# Patient Record
Sex: Female | Born: 1950 | Race: White | Hispanic: No | Marital: Married | Smoking: Never smoker
Health system: Southern US, Community
[De-identification: ages and names within clinical notes are randomized; demographics above are authoritative.]

## PROBLEM LIST (undated history)

## (undated) DIAGNOSIS — C4491 Basal cell carcinoma of skin, unspecified: Secondary | ICD-10-CM

## (undated) DIAGNOSIS — R0600 Dyspnea, unspecified: Secondary | ICD-10-CM

## (undated) DIAGNOSIS — E7800 Pure hypercholesterolemia, unspecified: Secondary | ICD-10-CM

## (undated) DIAGNOSIS — E119 Type 2 diabetes mellitus without complications: Secondary | ICD-10-CM

## (undated) DIAGNOSIS — Z7901 Long term (current) use of anticoagulants: Secondary | ICD-10-CM

## (undated) DIAGNOSIS — E1169 Type 2 diabetes mellitus with other specified complication: Secondary | ICD-10-CM

## (undated) DIAGNOSIS — K551 Chronic vascular disorders of intestine: Secondary | ICD-10-CM

## (undated) DIAGNOSIS — Z8049 Family history of malignant neoplasm of other genital organs: Secondary | ICD-10-CM

## (undated) DIAGNOSIS — I2510 Atherosclerotic heart disease of native coronary artery without angina pectoris: Secondary | ICD-10-CM

## (undated) DIAGNOSIS — C229 Malignant neoplasm of liver, not specified as primary or secondary: Secondary | ICD-10-CM

## (undated) DIAGNOSIS — I259 Chronic ischemic heart disease, unspecified: Secondary | ICD-10-CM

## (undated) DIAGNOSIS — E118 Type 2 diabetes mellitus with unspecified complications: Secondary | ICD-10-CM

## (undated) DIAGNOSIS — K219 Gastro-esophageal reflux disease without esophagitis: Secondary | ICD-10-CM

## (undated) DIAGNOSIS — Z9861 Coronary angioplasty status: Principal | ICD-10-CM

## (undated) DIAGNOSIS — R0609 Other forms of dyspnea: Secondary | ICD-10-CM

## (undated) DIAGNOSIS — I700 Atherosclerosis of aorta: Secondary | ICD-10-CM

## (undated) DIAGNOSIS — E785 Hyperlipidemia, unspecified: Secondary | ICD-10-CM

## (undated) HISTORY — DX: Atherosclerotic heart disease of native coronary artery without angina pectoris: I25.10

## (undated) HISTORY — PX: CARDIAC CATHETERIZATION: SHX172

## (undated) HISTORY — DX: Hyperlipidemia, unspecified: E11.69

## (undated) HISTORY — DX: Family history of malignant neoplasm of other genital organs: Z80.49

## (undated) HISTORY — PX: COLONOSCOPY: SHX174

## (undated) HISTORY — DX: Type 2 diabetes mellitus with unspecified complications: E11.8

## (undated) HISTORY — PX: CHOLECYSTECTOMY: SHX55

## (undated) HISTORY — DX: Basal cell carcinoma of skin, unspecified: C44.91

## (undated) HISTORY — DX: Type 2 diabetes mellitus with other specified complication: E78.5

## (undated) HISTORY — DX: Coronary angioplasty status: Z98.61

---

## 2004-01-19 ENCOUNTER — Ambulatory Visit: Payer: Self-pay | Admitting: Internal Medicine

## 2005-02-07 ENCOUNTER — Ambulatory Visit: Payer: Self-pay

## 2006-07-17 ENCOUNTER — Ambulatory Visit: Payer: Self-pay | Admitting: Internal Medicine

## 2007-07-21 ENCOUNTER — Ambulatory Visit: Payer: Self-pay | Admitting: Internal Medicine

## 2008-08-04 ENCOUNTER — Ambulatory Visit: Payer: Self-pay | Admitting: Internal Medicine

## 2009-12-25 ENCOUNTER — Ambulatory Visit: Payer: Self-pay | Admitting: Internal Medicine

## 2010-03-11 DIAGNOSIS — I2510 Atherosclerotic heart disease of native coronary artery without angina pectoris: Secondary | ICD-10-CM

## 2010-03-11 HISTORY — DX: Atherosclerotic heart disease of native coronary artery without angina pectoris: I25.10

## 2010-03-11 HISTORY — PX: CORONARY STENT INTERVENTION: CATH118234

## 2010-03-15 ENCOUNTER — Ambulatory Visit: Payer: Self-pay | Admitting: Internal Medicine

## 2010-04-11 ENCOUNTER — Ambulatory Visit: Payer: Self-pay | Admitting: Cardiology

## 2010-04-12 DIAGNOSIS — I2510 Atherosclerotic heart disease of native coronary artery without angina pectoris: Secondary | ICD-10-CM

## 2010-04-12 HISTORY — PX: CORONARY ANGIOPLASTY WITH STENT PLACEMENT: SHX49

## 2010-04-12 HISTORY — DX: Atherosclerotic heart disease of native coronary artery without angina pectoris: I25.10

## 2010-04-19 ENCOUNTER — Emergency Department (HOSPITAL_COMMUNITY): Payer: 59

## 2010-04-19 ENCOUNTER — Observation Stay (HOSPITAL_COMMUNITY)
Admission: EM | Admit: 2010-04-19 | Discharge: 2010-04-20 | DRG: 313 | Disposition: A | Discharge status: Home / Self Care | Payer: 59 | Attending: Internal Medicine | Admitting: Internal Medicine

## 2010-04-19 DIAGNOSIS — R0989 Other specified symptoms and signs involving the circulatory and respiratory systems: Secondary | ICD-10-CM | POA: Insufficient documentation

## 2010-04-19 DIAGNOSIS — E785 Hyperlipidemia, unspecified: Secondary | ICD-10-CM | POA: Diagnosis present

## 2010-04-19 DIAGNOSIS — R079 Chest pain, unspecified: Secondary | ICD-10-CM

## 2010-04-19 DIAGNOSIS — I2510 Atherosclerotic heart disease of native coronary artery without angina pectoris: Secondary | ICD-10-CM | POA: Diagnosis present

## 2010-04-19 DIAGNOSIS — E119 Type 2 diabetes mellitus without complications: Secondary | ICD-10-CM | POA: Diagnosis present

## 2010-04-19 DIAGNOSIS — R0602 Shortness of breath: Secondary | ICD-10-CM

## 2010-04-19 DIAGNOSIS — R0789 Other chest pain: Principal | ICD-10-CM | POA: Diagnosis present

## 2010-04-19 DIAGNOSIS — E669 Obesity, unspecified: Secondary | ICD-10-CM | POA: Diagnosis present

## 2010-04-19 DIAGNOSIS — I10 Essential (primary) hypertension: Secondary | ICD-10-CM | POA: Diagnosis present

## 2010-04-19 DIAGNOSIS — Z880 Allergy status to penicillin: Secondary | ICD-10-CM

## 2010-04-19 DIAGNOSIS — Z9861 Coronary angioplasty status: Secondary | ICD-10-CM

## 2010-04-19 DIAGNOSIS — Z7902 Long term (current) use of antithrombotics/antiplatelets: Secondary | ICD-10-CM

## 2010-04-19 DIAGNOSIS — R0609 Other forms of dyspnea: Secondary | ICD-10-CM | POA: Insufficient documentation

## 2010-04-19 DIAGNOSIS — Z7982 Long term (current) use of aspirin: Secondary | ICD-10-CM

## 2010-04-19 LAB — POCT CARDIAC MARKERS
CKMB, poc: <1 ng/mL — ABNORMAL LOW (ref 1.0–8.0)
CKMB, poc: <1 ng/mL — ABNORMAL LOW (ref 1.0–8.0)
Myoglobin, poc: 33.6 ng/mL (ref 12–200)
Myoglobin, poc: 41.2 ng/mL (ref 12–200)

## 2010-04-19 LAB — DIFFERENTIAL
Basophils Absolute: 0 10*3/uL (ref 0.0–0.1)
Lymphocytes Relative: 36 % (ref 12–46)
Monocytes Absolute: 0.4 10*3/uL (ref 0.1–1.0)
Monocytes Relative: 8 % (ref 3–12)
Neutro Abs: 2.8 10*3/uL (ref 1.7–7.7)
Neutrophils Relative %: 54 % (ref 43–77)

## 2010-04-19 LAB — CBC
HCT: 36.7 % (ref 36.0–46.0)
Hemoglobin: 12.4 g/dL (ref 12.0–15.0)
MCH: 30.3 pg (ref 26.0–34.0)
MCHC: 33.8 g/dL (ref 30.0–36.0)
RBC: 4.09 MIL/uL (ref 3.87–5.11)

## 2010-04-19 LAB — URINALYSIS, ROUTINE W REFLEX MICROSCOPIC
Bilirubin Urine: NEGATIVE
Hgb urine dipstick: NEGATIVE
Ketones, ur: NEGATIVE mg/dL
Protein, ur: NEGATIVE mg/dL
Urine Glucose, Fasting: NEGATIVE mg/dL
Urobilinogen, UA: 0.2 mg/dL (ref 0.0–1.0)

## 2010-04-19 LAB — URINE MICROSCOPIC-ADD ON

## 2010-04-19 LAB — CARDIAC PANEL(CRET KIN+CKTOT+MB+TROPI): Relative Index: INVALID (ref 0.0–2.5)

## 2010-04-19 LAB — CK TOTAL AND CKMB (NOT AT ARMC)
CK, MB: 0.6 ng/mL (ref 0.3–4.0)
Relative Index: INVALID (ref 0.0–2.5)
Total CK: 52 U/L (ref 7–177)

## 2010-04-19 LAB — LIPASE, BLOOD: Lipase: 102 U/L — ABNORMAL HIGH (ref 11–59)

## 2010-04-19 LAB — POCT I-STAT, CHEM 8
BUN: 14 mg/dL (ref 6–23)
Creatinine, Ser: 0.9 mg/dL (ref 0.4–1.2)
Glucose, Bld: 197 mg/dL — ABNORMAL HIGH (ref 70–99)
Hemoglobin: 12.9 g/dL (ref 12.0–15.0)
Potassium: 3.9 mEq/L (ref 3.5–5.1)
Sodium: 139 mEq/L (ref 135–145)

## 2010-04-19 LAB — AMYLASE: Amylase: 105 U/L (ref 0–105)

## 2010-04-20 ENCOUNTER — Inpatient Hospital Stay (HOSPITAL_COMMUNITY): Payer: 59

## 2010-04-20 LAB — CARDIAC PANEL(CRET KIN+CKTOT+MB+TROPI)
CK, MB: 0.5 ng/mL (ref 0.3–4.0)
Relative Index: INVALID (ref 0.0–2.5)
Total CK: 48 U/L (ref 7–177)

## 2010-04-20 LAB — COMPREHENSIVE METABOLIC PANEL
Albumin: 3.5 g/dL (ref 3.5–5.2)
BUN: 9 mg/dL (ref 6–23)
CO2: 23 mEq/L (ref 19–32)
Calcium: 8.8 mg/dL (ref 8.4–10.5)
Chloride: 110 mEq/L (ref 96–112)
Creatinine, Ser: 0.73 mg/dL (ref 0.4–1.2)
GFR calc Af Amer: >60 mL/min (ref >60)
GFR calc non Af Amer: >60 mL/min (ref >60)
Total Bilirubin: 0.5 mg/dL (ref 0.3–1.2)

## 2010-04-20 LAB — URINE CULTURE: Colony Count: >=100000

## 2010-04-20 LAB — PROTIME-INR
INR: 0.98 (ref 0.00–1.49)
Prothrombin Time: 13.2 seconds (ref 11.6–15.2)

## 2010-04-20 LAB — CBC
Hemoglobin: 12.2 g/dL (ref 12.0–15.0)
MCH: 30.7 pg (ref 26.0–34.0)
Platelets: 206 10*3/uL (ref 150–400)
RBC: 3.98 MIL/uL (ref 3.87–5.11)
WBC: 4.6 10*3/uL (ref 4.0–10.5)

## 2010-04-20 LAB — GLUCOSE, CAPILLARY
Glucose-Capillary: 106 mg/dL — ABNORMAL HIGH (ref 70–99)
Glucose-Capillary: 157 mg/dL — ABNORMAL HIGH (ref 70–99)

## 2010-04-20 LAB — HEPARIN LEVEL (UNFRACTIONATED): Heparin Unfractionated: <0.1 IU/mL — ABNORMAL LOW (ref 0.30–0.70)

## 2010-04-26 NOTE — H&P (Signed)
[facility], [facility]                 ACCOUNT NO.:  [id number]  MEDICAL RECORD NO.:  [id number]           PATIENT TYPE:  I  LOCATION:  6526                         FACILITY:  [facility]  PHYSICIAN:  [name]. [name], MD    DATE OF BIRTH:  07-15-1950  DATE OF ADMISSION:  04/19/2010 DATE OF DISCHARGE:                             HISTORY & PHYSICAL   PRIMARY CARE PROVIDER:  Dr. [name] at [facility].  PRIMARY CARDIOLOGIST:  Dr. [name] in [place].  PATIENT PROFILE:  This is a 60 year old female with recent diagnosis of coronary artery disease, status post LAD stenting on April 11, 2010, who presents with recurrent chest pain and dyspnea.  PROBLEM LIST: 1. Unstable angina/coronary artery disease.     a.     The patient had an abnormal Myoview approximately 3 weeks      ago.     b.     Catheterization with percutaneous coronary intervention and      stenting of the left anterior descending at [facility] on      April 11, 2010.  Not known at this time if it was a drug-eluting      stent or bare-metal stent. 2. Hyperlipidemia x3-4 years. 3. Diabetes mellitus x5 years. 4. Status post cholecystectomy. 5. Obesity.  ALLERGIES:  PENICILLIN.  HISTORY OF PRESENT ILLNESS:  This is a 59 year old female with history of progressive dyspnea on exertion and chest pain who was referred for a Myoview earlier this year which apparently is abnormal.  She was subsequently seen by Dr. [name] and underwent diagnostic catheterization on April 11, 2010, which by her report showed "an 85% widowmaker."  This, like the LAD, was stented with at least 1 stent. She does not have a stent card and it is not likely if this is a bare- metal stent or drug-eluting stent.  She was discharged to home on April 12, 2010.  Following discharge, she is recovered well and had not had any recurrent chest pain or dyspnea.  This morning, however, she was at work and felt like she could not  quite get of breath.  She noted "airy" sensation and discomfort in the Elliott of her chest and also felt well a little bit lightheaded.  She got up and walked around some and thought that she felt better, but then later had recurrence.  She then left work and went home and while at home had left upper back sharp and shooting pain moving to the front of her left chest associated with soreness and tenderness of the left chest.  She also noted left arm aching.  She feels as though this is exactly similar to her prior angina and for that reason she called the EMS.  En route to the ED, she was treated with sublingual nitroglycerin with some improvement, but not complete relief.  ECG shows no acute changes and so far point-of-care markers are negative.  She continued to complain about 4/10 left chest tightness and arm aching with chest wall soreness.  HOME MEDICATIONS: 1. Plavix 75 mg daily. 2. Glimepiride 4 mg b.i.d. 3. Actos 15 mg  daily. 4. Januvia 100 mg daily. 5. Aspirin 325 mg daily. 6. She also takes WelChol 6 tablets a day.  Of note, she previously took lovastatin, but was taken off of it by her primary care provider in favor of WelChol.  She did not have any intolerance.  FAMILY HISTORY:  Mother died of brain cancer at 52.  Father died at 70 with a history of coronary artery disease, tobacco and alcohol abuse. She has 6 sisters who are all alive and well.  There is at least 1 sister with diabetes.  SOCIAL HISTORY:  The patient lives in [place] with her husband.  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  She denies tobacco or drug use. She has 4-5 drinks per year.  She is not exercising since December.  REVIEW OF SYSTEMS:  Positive for chest pain, dyspnea, lightheadedness, chest wall tenderness, and diabetes.  Otherwise, all systems are reviewed and are negative.  She is a full code.  PHYSICAL EXAMINATION:  VITAL SIGNS:  Temperature 98.1, heart rate 73, respirations 16,  blood pressure 102/50, and pulse ox 100% on room air. GENERAL:  A pleasant white female in no acute distress.  Awake, alert, and oriented x3.  She has a normal affect. HEENT:  Normal. NEURO:  Grossly intact and nonfocal. SKIN:  Warm and dry without lesions or masses. NECK:  Supple without bruits or JVD. LUNGS:  Respirations are regular and unlabored.  Clear to auscultation. CARDIAC:  Regular S1 and S2.  No S3, S4, or murmurs.  Left upper chest wall tenderness is noted. ABDOMEN:  Round, soft, nontender, and nondistended.  Bowel sounds present x4. EXTREMITIES:  Warm, dry, and pink.  No clubbing, cyanosis, or edema. Dorsalis pedis, posterior tibial, and radial pulses are 2+ and equal bilaterally.  Her previous cath site is without bleeding, bruits, or hematoma.  She has a normal Allen's on the right.  Chest x-ray is pending.  EKG shows sinus rhythm, rate 70, normal axis, no acute ST-T changes.  Hemoglobin 12.4, hematocrit 36.7, WBC 5.2, and platelets 234.  Sodium 139, potassium 3.9, chloride 107, CO2 of 22, BUN 14, creatinine 0.9, and glucose 197.  CK-MB less than 1.0 and troponin I less than 0.05.  ASSESSMENT/PLAN: 1. Unstable angina/coronary artery disease:  The patient presents with     recurrent left chest and scapular pain as well as left arm aching,     similar to prior angina.  Her left chest is also quite tender which     is fairly atypical.  So far, enzymes are negative and ECG is     nonacute.  Not clearly cardiac, but with similarity prior angina     and recent PCI.  Plan to admit and cycle enzymes.  Probable cath in     a.m.  Records had been requested from [facility].  We will     continue aspirin and Plavix and check a P2Y12.  Resume statin.     This is not entirely clear why this was stopped especially in the     setting of diabetes and coronary artery disease.  Add heparin and     nitrate for tonight. 2. Hyperlipidemia:  The patient was on lovastatin at home  which was     changed to [facility] by her primary care provider.  This was before     she had the diagnosis of CAD and underwent stenting.  She was not     taken off because of intolerance, in fact  she says she tolerated it     better than the WelChol.  We will resume high-dose     statin therapy. 3. Diabetes mellitus:  Continue home medications. 4. Obesity:  The patient will benefit from cardiac rehab evaluation.     Nicolasa Ducking, ANP   ______________________________ [name]. [name], MD    CB/MEDQ  D:  04/19/2010  T:  04/20/2010  Job:  [id number]  Electronically Signed by Nicolasa Ducking ANP on 04/23/2010 12:07:34 PM Electronically Signed by Lewayne Bunting MD on 04/26/2010 05:44:02 PM

## 2010-05-09 ENCOUNTER — Encounter: Payer: Self-pay | Admitting: Cardiology

## 2010-05-10 ENCOUNTER — Encounter: Payer: Self-pay | Admitting: Cardiology

## 2010-05-17 NOTE — Discharge Summary (Signed)
NAMEJAZMON, [facility]                 ACCOUNT NO.:  [id number]  MEDICAL RECORD NO.:  [id number]           PATIENT TYPE:  I  LOCATION:  6526                         FACILITY:  [facility]  PHYSICIAN:  Veverly Fells. Excell Seltzer, MD  DATE OF BIRTH:  Oct 04, 1950  DATE OF ADMISSION:  04/19/2010 DATE OF DISCHARGE:  04/20/2010                              DISCHARGE SUMMARY   PRIMARY CARDIOLOGIST:  Dr. [name] in [place].  PRIMARY CARE PROVIDER:  Dr. [name] at [place].  DISCHARGE DIAGNOSES: 1. Atypical chest pain, ruled out for myocardial infarction on this     admission. 2. Coronary artery disease status post percutaneous coronary     intervention and stenting of the left anterior descending artery at     [facility] on April 11, 2010. 3. Diabetes mellitus. 4. Hypertension. 5. Hyperlipidemia. 6. Obesity.  ALLERGIES:  PENICILLIN.  PROCEDURE/DIAGNOSTICS PERFORMED DURING HOSPITALIZATION:  Chest x-ray showing no active cardiopulmonary process.  REASON FOR HOSPITALIZATION:  This is a 60 year old female with the above- stated problem list who presented to [facility] via EMS with recurrent chest pain and dyspnea.  Her pain was associated with left upper back pain that was sharp and shooting.  It was associated with soreness and tenderness of the left chest.  There was some left arm aching.  The patient was treated with sublingual nitroglycerin en route to the emergency department without complete relief.  EKG showed no acute changes and initial point-of-care markers were negative.  The patient was admitted by the Cardiology Service for further evaluation.  HOSPITAL COURSE:  The patient ruled out for myocardial infarction with cardiac enzymes remaining negative x3.  She did have some mild focal left chest pain in the upper chest during the evening but this has dissipated.  The patient's followup EKG again showed normal sinus rhythm with no acute changes.  With the patient's  chest pain being atypical and enzymes and EKG being completely normal, it was felt that cardiac catheterization was not warranted at this time.  The patient does have a followup with Dr. [name] next week and should keep this appointments. She has been counseled regarding recurrent symptoms, specifically which should prompt a EMS call.  Dr. Excell Seltzer called and spoke with Dr. [name] by telephone and I have discussed the case with him, he is agreeable to the plan as above.  The patient will be continued on her home medications and given a trial of Protonix for questionable GI etiology.  On day of discharge, Dr. Excell Seltzer evaluated the patient and noted her stable for home with outpatient followup.  The patient is agreeable to the above.  Of note, the patient states she has been taken off her Lipitor prior to starting WelChol per her primary care provider.  She states she was not taken off secondary to intolerance but we will defer decision to restart lovastatin to Dr. [name] at her outpatient follow up next week.  DISCHARGE LABS:  WBC 4.6, hemoglobin 12.2, hematocrit 36, platelets 206,000.  Sodium 140, potassium 3.9, BUN 9, creatinine 0.73.  Cardiac enzymes negative x3.  DISCHARGE MEDICATIONS: 1. Protonix 40 mg 1  tablet daily. 2. Actos 50 mg 1 tablet daily. 3. Aspirin 325 mg 1 tablet daily. 4. Glimepiride 4 mg half tablet each morning and 1-1/2 tablets each     evening. 5. Januvia 100 mg 1 tablet daily. 6. Juice Plus over-the-counter 1 tablet twice daily. 7. Multivitamin over-the-counter 1 tablet daily. 8. Plavix 75 mg 1 tablet daily. 9. Potassium over-the-counter 1 tablet daily. 10.WelChol 625 mg 3 tablets twice daily.  FOLLOW UP PLANS: 1. Patient is to keep outpatient follow up with Dr. [name] next week. 2. Patient is to increase activity as tolerated.  3. Patient is to continue a low sodium, heart healthy diet.   Duration of discharge is greater than 30 minutes with  physician and physicians extender time.     Leonette Monarch, PA-C   ______________________________ Veverly Fells. Excell Seltzer, MD    NB/MEDQ  D:  04/20/2010  T:  04/20/2010  Job:  [id number]  cc:   [name], MD [name], MD  Electronically Signed by Alen Blew P.A. on 04/23/2010 04:19:13 PM Electronically Signed by Tonny Bollman MD on 05/15/2010 08:55:18 PM

## 2010-06-10 ENCOUNTER — Encounter: Payer: Self-pay | Admitting: Cardiology

## 2011-01-15 ENCOUNTER — Ambulatory Visit: Payer: Self-pay | Admitting: Internal Medicine

## 2012-01-15 ENCOUNTER — Ambulatory Visit: Payer: Self-pay | Admitting: Obstetrics and Gynecology

## 2012-01-27 ENCOUNTER — Ambulatory Visit: Payer: 59 | Admitting: Family Medicine

## 2013-01-19 ENCOUNTER — Ambulatory Visit: Payer: Self-pay | Admitting: Obstetrics and Gynecology

## 2013-04-16 ENCOUNTER — Ambulatory Visit: Payer: Self-pay | Admitting: Gastroenterology

## 2013-10-20 DIAGNOSIS — E785 Hyperlipidemia, unspecified: Secondary | ICD-10-CM | POA: Insufficient documentation

## 2013-10-20 DIAGNOSIS — I2510 Atherosclerotic heart disease of native coronary artery without angina pectoris: Secondary | ICD-10-CM | POA: Insufficient documentation

## 2014-01-20 ENCOUNTER — Ambulatory Visit: Payer: Self-pay

## 2014-08-22 DIAGNOSIS — K219 Gastro-esophageal reflux disease without esophagitis: Secondary | ICD-10-CM | POA: Insufficient documentation

## 2015-01-02 ENCOUNTER — Other Ambulatory Visit: Payer: Self-pay | Admitting: Infectious Diseases

## 2015-01-02 DIAGNOSIS — Z1231 Encounter for screening mammogram for malignant neoplasm of breast: Secondary | ICD-10-CM

## 2015-01-23 ENCOUNTER — Ambulatory Visit
Admission: RE | Admit: 2015-01-23 | Discharge: 2015-01-23 | Disposition: A | Discharge status: Home / Self Care | Payer: 59 | Source: Ambulatory Visit | Attending: Infectious Diseases | Admitting: Infectious Diseases

## 2015-01-23 DIAGNOSIS — Z1231 Encounter for screening mammogram for malignant neoplasm of breast: Secondary | ICD-10-CM | POA: Diagnosis present

## 2015-12-07 ENCOUNTER — Other Ambulatory Visit: Payer: Self-pay | Admitting: Infectious Diseases

## 2015-12-07 DIAGNOSIS — Z1231 Encounter for screening mammogram for malignant neoplasm of breast: Secondary | ICD-10-CM

## 2016-01-25 ENCOUNTER — Ambulatory Visit
Admission: RE | Admit: 2016-01-25 | Discharge: 2016-01-25 | Disposition: A | Discharge status: Home / Self Care | Payer: 59 | Source: Ambulatory Visit | Attending: Infectious Diseases | Admitting: Infectious Diseases

## 2016-01-25 DIAGNOSIS — Z1231 Encounter for screening mammogram for malignant neoplasm of breast: Secondary | ICD-10-CM

## 2016-12-04 ENCOUNTER — Other Ambulatory Visit: Payer: Self-pay | Admitting: Infectious Diseases

## 2016-12-04 DIAGNOSIS — Z1231 Encounter for screening mammogram for malignant neoplasm of breast: Secondary | ICD-10-CM

## 2017-02-12 ENCOUNTER — Ambulatory Visit
Admission: RE | Admit: 2017-02-12 | Discharge: 2017-02-12 | Disposition: A | Discharge status: Home / Self Care | Payer: 59 | Source: Ambulatory Visit | Attending: Infectious Diseases | Admitting: Infectious Diseases

## 2017-02-12 DIAGNOSIS — Z1231 Encounter for screening mammogram for malignant neoplasm of breast: Secondary | ICD-10-CM | POA: Insufficient documentation

## 2017-05-09 HISTORY — PX: NM GATED MYOVIEW (ARMX HX): HXRAD1831

## 2017-12-10 ENCOUNTER — Other Ambulatory Visit: Payer: Self-pay | Admitting: Internal Medicine

## 2017-12-10 DIAGNOSIS — Z1231 Encounter for screening mammogram for malignant neoplasm of breast: Secondary | ICD-10-CM

## 2018-02-13 ENCOUNTER — Ambulatory Visit
Admission: RE | Admit: 2018-02-13 | Discharge: 2018-02-13 | Disposition: A | Discharge status: Home / Self Care | Payer: 59 | Source: Ambulatory Visit | Attending: Internal Medicine | Admitting: Internal Medicine

## 2018-02-13 DIAGNOSIS — Z1231 Encounter for screening mammogram for malignant neoplasm of breast: Secondary | ICD-10-CM | POA: Insufficient documentation

## 2018-02-16 ENCOUNTER — Other Ambulatory Visit: Payer: Self-pay | Admitting: Internal Medicine

## 2018-02-16 DIAGNOSIS — R921 Mammographic calcification found on diagnostic imaging of breast: Secondary | ICD-10-CM

## 2018-02-16 DIAGNOSIS — R928 Other abnormal and inconclusive findings on diagnostic imaging of breast: Secondary | ICD-10-CM

## 2018-02-26 ENCOUNTER — Ambulatory Visit
Admission: RE | Admit: 2018-02-26 | Discharge: 2018-02-26 | Disposition: A | Discharge status: Home / Self Care | Payer: 59 | Source: Ambulatory Visit | Attending: Internal Medicine | Admitting: Internal Medicine

## 2018-02-26 DIAGNOSIS — R921 Mammographic calcification found on diagnostic imaging of breast: Secondary | ICD-10-CM | POA: Insufficient documentation

## 2018-02-26 DIAGNOSIS — R928 Other abnormal and inconclusive findings on diagnostic imaging of breast: Secondary | ICD-10-CM

## 2018-05-04 ENCOUNTER — Ambulatory Visit: Payer: 59 | Admitting: Cardiology

## 2018-05-04 ENCOUNTER — Encounter: Payer: Self-pay | Admitting: Cardiology

## 2018-05-04 VITALS — BP 108/56 | HR 81 | Ht 68.0 in | Wt 196.8 lb

## 2018-05-04 DIAGNOSIS — I2510 Atherosclerotic heart disease of native coronary artery without angina pectoris: Secondary | ICD-10-CM

## 2018-05-04 DIAGNOSIS — Z9861 Coronary angioplasty status: Secondary | ICD-10-CM | POA: Diagnosis not present

## 2018-05-04 NOTE — Progress Notes (Signed)
PCP: Patient, No Pcp Per  Cardiologist: Isaias Cowman, MD  [facility]  [place], [place] [address]  Clinic Note: Chief Complaint  Patient presents with  . Advice Only    Questions about medications for CAD  . Coronary Artery Disease    Status post LAD PCI   HPI: [facility] is a 68 y.o. female with a history of single-vessel CAD (DES PCI to LAD in 2012) who presents today for second opinion about medications for her CAD, specifically questions about aspirin Plavix)  [facility] has a history of CAD-LAD DES PCI (2012) - normal Myoview 04/2017 (as well as 2012 and 2013)  [facility] was last seen on by Dr. Saralyn Pilar back in September 2019: Was told to continue aspirin and Plavix.  No recurrent chest pain.  Excellent control of cholesterol and blood pressure.  Discussed diet patient with low-cholesterol diet (DASH)  Recent Hospitalizations: None  Studies Personally Reviewed - (if available, images/films reviewed: From Epic Chart or Care Everywhere)  [facility] 05/09/2017: [facility]) exercised 8:36 min with no chest pain or dyspnea.  No EKG changes.  Normal EF 66%.  No ischemia or infarction)  Interval History: [facility] presents here today really just asked few questions.  She seems to be relatively asymptomatic from a cardiac standpoint.  She still does her [employer] exercises 2-3 times a week without any difficulty.  She denies any resting exertional chest tightness or pressure.  No exertional dyspnea.  No PND, orthopnea or edema.  No palpitations, lightheadedness, dizziness, weakness or syncope/near syncope. No TIA/amaurosis fugax symptoms. No melena, hematochezia, hematuria, or epstaxis. No claudication.   Review of Systems  Psychiatric/Behavioral: Negative for memory loss. The patient is not nervous/anxious and does not have insomnia.     I have reviewed and (if needed) personally updated the  patient's problem list, medications, allergies, past medical and surgical history, social and family history.   Past Medical History:  Diagnosis Date  . Basal cell carcinoma    basal cell -skin cancer s/p removal  . CAD S/P percutaneous coronary angioplasty 2012   Dr. Saralyn Pilar ([place], [place]): DES PCI to LAD  . DM (diabetes mellitus), type 2 with complications (HCC)    CAD  . Hyperlipidemia associated with type 2 diabetes mellitus ([place])    On lovastatin    Past Surgical History:  Procedure Laterality Date  . CHOLECYSTECTOMY    . CORONARY STENT INTERVENTION  2012   Dr. Saralyn Pilar ([place]): DES PCI pLAD  . NM GATED MYOVIEW (ARMX HX)  05/09/2017   [facility]) exercised 8:36 min with no chest pain or dyspnea.  No EKG changes.  Normal EF 66%.  No ischemia or infarction)    Current Meds  Medication Sig  . aspirin EC 81 MG tablet Take 1 tablet by mouth daily.  . Calcium-Magnesium-Zinc 333-133-5 MG TABS Take 1 tablet by mouth daily.  . clopidogrel (PLAVIX) 75 MG tablet Take 1 tablet by mouth daily.  Marland Kitchen glimepiride (AMARYL) 4 MG tablet Take 0.5 tablet by mouth every morning and take 1.5 tablets by mouth every evening  . JANUMET 50-500 MG tablet Take 1 tablet by mouth 2 (two) times daily.  Marland Kitchen JARDIANCE 25 MG TABS tablet Take 1 tablet by mouth daily.  . lansoprazole (PREVACID) 15 MG capsule Take 1 capsule by mouth daily.  Marland Kitchen lovastatin (MEVACOR) 40 MG tablet Take 1 tablet by mouth at bedtime.  Marland Kitchen  metFORMIN (GLUCOPHAGE) 500 MG tablet Take 1 tablet by mouth 2 (two) times daily.  . Multiple Vitamin (MULTIVITAMIN) tablet Take 1 tablet by mouth daily.    Allergies  Allergen Reactions  . Exenatide Nausea Only  . Orlistat     Other reaction(s): Unknown  . Penicillins Hives and Swelling  . Thiazide-Type Diuretics     Other reaction(s): Unknown    Social History   Tobacco Use  . Smoking status: Never Smoker  . Smokeless tobacco:  Never Used  Substance Use Topics  . Alcohol use: Not Currently  . Drug use: Never   Social History   Social History Narrative   reports that she has never smoked. She has never used smokeless tobacco. She reports that she rarely drinks alcohol. She reports that she does not use drugs.      Married to [facility]     family history includes Alcohol abuse in her father; CAD in her father and mother; Diabetes in an other family member; Diabetes Mellitus II in her father, mother, and sister; Heart attack in her father; Hypertension in her father and mother; Uterine cancer in her mother.  Wt Readings from Last 3 Encounters:  05/04/18 196 lb 12.8 oz (89.3 kg)    PHYSICAL EXAM BP (!) 108/56 (BP Location: Right Arm, Patient Position: Sitting, Cuff Size: Large)   Pulse 81   Ht 5\' 8"  (1.727 m)   Wt 196 lb 12.8 oz (89.3 kg)   BMI 29.92 kg/m  Physical Exam  Constitutional: She appears well-developed and well-nourished. No distress.  HENT:  Head: Normocephalic and atraumatic.  Psychiatric: She has a normal mood and affect. Her behavior is normal. Judgment and thought content normal.  Vitals reviewed.    Adult ECG Report  Rate: 81;  Rhythm: normal sinus rhythm and Otherwise normal axis, intervals and durations.  Narrative Interpretation: Normal EKG   Other studies Reviewed: Additional studies/ records that were reviewed today include:  Recent Labs:  04/2017 (Care Everywhere) -- LDL cholesterol 66 on 04/17/2017, currently on lovastatin; hemoglobin A1c was 7.5 on 04/17/2017  ASSESSMENT / PLAN: Problem List Items Addressed This Visit    CAD S/P percutaneous coronary angioplasty - Primary (Chronic)    She has a likely 2nd-3rd generation DES stent in the LAD (do not have the cath report available).  She has had several follow-up stress test that are negative and has no symptoms.  She does note some bruising, but not significant amount of bleeding issues.  She is asked several  times about her antiplatelet agents and wanted clarification.  She says that she had been told for 6 years that she would never come off of Plavix, but then for 2 intermittent visit she has been told that she could stop it at her should not stop it.  She is not sure what this means.  Explained to her that beyond 2 years we do not have a lot of data for antiplatelet agents with CAD and PCI.  However, given the location of her stent in the proximal LAD, secondary prevention becomes a critical issue. I discussed the risk and benefits of being on Plavix versus not which would be being on aspirin alone.  At this point I do not think that she needs to be on dual antiplatelet therapy however and I think she can probably stop the aspirin.  My recommendation for more secure CAD even secondary prevention, would be to simply continue Plavix alone without aspirin.  This Plavix could  be held for 5 to 7 days preop for any procedures or surgeries if necessary.      Relevant Medications   aspirin EC 81 MG tablet   lovastatin (MEVACOR) 40 MG tablet   Other Relevant Orders   EKG 12-Lead     She is otherwise on pretty good management regiment with adequate control blood pressure and lipids.  I recommended that she continue current treatment plan with Dr. Saralyn Pilar, but would be okay to stop aspirin and continue Plavix alone.  I spent a total of 25 minutes with the patient and chart review. >  50% of the time was spent in direct patient consultation.   Current medicines are reviewed at length with the patient today.  (+/- concerns) questions about aspirin versus Plavix The following changes have been made:  Discussed above and below.  Patient Instructions  Medication Instructions:  OKAY TO STOP ASPIRIN CONTINUE TAKING PLAVIX 75 MG   IT WOULD BE OKAY TO HOLD PLAVIX FOR PROCEDURES  , CONTACT CARDIOLOGIST FOR FULL INSTRUCTIONS If you need a refill on your cardiac medications before your next appointment, please  call your pharmacy.   Lab work: NOT NEEDED If you have labs (blood work) drawn today and your tests are completely normal, you will receive your results only by: Marland Kitchen MyChart Message (if you have MyChart) OR . A paper copy in the mail If you have any lab test that is abnormal or we need to change your treatment, we will call you to review the results.  Testing/Procedures: NOT NEEDED  Follow-Up: At [facility], you and your health needs are our priority.  As part of our continuing mission to provide you with exceptional heart care, we have created designated Provider Care Teams.  These Care Teams include your primary Cardiologist (physician) and Advanced Practice Providers (APPs -  Physician Assistants and Nurse Practitioners) who all work together to provide you with the care you need, when you need it. . Your physician recommends that you schedule a follow-up appointment ON AN AS NEEDED BASIS .   Any Other Special Instructions Will Be Listed Below (If Applicable).       Studies Ordered:   Orders Placed This Encounter  Procedures  . EKG 12-Lead      Glenetta Hew, M.D., M.S. Interventional Cardiologist   Pager # [phone] Phone # [phone] [address] [place] [address], [place] [address]   Thank you for choosing Heartcare at [facility]!!

## 2018-05-04 NOTE — Patient Instructions (Signed)
Medication Instructions:  OKAY TO STOP ASPIRIN CONTINUE TAKING PLAVIX 75 MG   IT WOULD BE OKAY TO HOLD PLAVIX FOR PROCEDURES  , CONTACT CARDIOLOGIST FOR FULL INSTRUCTIONS If you need a refill on your cardiac medications before your next appointment, please call your pharmacy.   Lab work: NOT NEEDED If you have labs (blood work) drawn today and your tests are completely normal, you will receive your results only by: Marland Kitchen MyChart Message (if you have MyChart) OR . A paper copy in the mail If you have any lab test that is abnormal or we need to change your treatment, we will call you to review the results.  Testing/Procedures: NOT NEEDED  Follow-Up: At [facility], you and your health needs are our priority.  As part of our continuing mission to provide you with exceptional heart care, we have created designated Provider Care Teams.  These Care Teams include your primary Cardiologist (physician) and Advanced Practice Providers (APPs -  Physician Assistants and Nurse Practitioners) who all work together to provide you with the care you need, when you need it. . Your physician recommends that you schedule a follow-up appointment ON AN AS NEEDED BASIS .   Any Other Special Instructions Will Be Listed Below (If Applicable).

## 2018-05-04 NOTE — Assessment & Plan Note (Signed)
She has a likely 2nd-3rd generation DES stent in the LAD (do not have the cath report available).  She has had several follow-up stress test that are negative and has no symptoms.  She does note some bruising, but not significant amount of bleeding issues.  She is asked several times about her antiplatelet agents and wanted clarification.  She says that she had been told for 6 years that she would never come off of Plavix, but then for 2 intermittent visit she has been told that she could stop it at her should not stop it.  She is not sure what this means.  Explained to her that beyond 2 years we do not have a lot of data for antiplatelet agents with CAD and PCI.  However, given the location of her stent in the proximal LAD, secondary prevention becomes a critical issue. I discussed the risk and benefits of being on Plavix versus not which would be being on aspirin alone.  At this point I do not think that she needs to be on dual antiplatelet therapy however and I think she can probably stop the aspirin.  My recommendation for more secure CAD even secondary prevention, would be to simply continue Plavix alone without aspirin.  This Plavix could be held for 5 to 7 days preop for any procedures or surgeries if necessary.

## 2018-11-19 ENCOUNTER — Other Ambulatory Visit: Payer: Self-pay | Admitting: Internal Medicine

## 2018-11-19 DIAGNOSIS — Z1231 Encounter for screening mammogram for malignant neoplasm of breast: Secondary | ICD-10-CM

## 2019-02-09 DIAGNOSIS — C50919 Malignant neoplasm of unspecified site of unspecified female breast: Secondary | ICD-10-CM

## 2019-02-09 HISTORY — PX: MASTECTOMY: SHX3

## 2019-02-09 HISTORY — DX: Malignant neoplasm of unspecified site of unspecified female breast: C50.919

## 2019-02-16 ENCOUNTER — Ambulatory Visit
Admission: RE | Admit: 2019-02-16 | Discharge: 2019-02-16 | Disposition: A | Discharge status: Home / Self Care | Payer: 59 | Source: Ambulatory Visit | Attending: Internal Medicine | Admitting: Internal Medicine

## 2019-02-16 DIAGNOSIS — Z1231 Encounter for screening mammogram for malignant neoplasm of breast: Secondary | ICD-10-CM | POA: Insufficient documentation

## 2019-02-18 ENCOUNTER — Other Ambulatory Visit: Payer: Self-pay | Admitting: Internal Medicine

## 2019-02-18 DIAGNOSIS — R928 Other abnormal and inconclusive findings on diagnostic imaging of breast: Secondary | ICD-10-CM

## 2019-02-18 DIAGNOSIS — N6489 Other specified disorders of breast: Secondary | ICD-10-CM

## 2019-03-03 ENCOUNTER — Ambulatory Visit
Admission: RE | Admit: 2019-03-03 | Discharge: 2019-03-03 | Disposition: A | Discharge status: Home / Self Care | Payer: 59 | Source: Ambulatory Visit | Attending: Internal Medicine | Admitting: Internal Medicine

## 2019-03-03 DIAGNOSIS — N6489 Other specified disorders of breast: Secondary | ICD-10-CM | POA: Insufficient documentation

## 2019-03-03 DIAGNOSIS — R928 Other abnormal and inconclusive findings on diagnostic imaging of breast: Secondary | ICD-10-CM | POA: Insufficient documentation

## 2019-03-08 ENCOUNTER — Other Ambulatory Visit: Payer: Self-pay | Admitting: Internal Medicine

## 2019-03-08 DIAGNOSIS — N6320 Unspecified lump in the left breast, unspecified quadrant: Secondary | ICD-10-CM

## 2019-03-08 DIAGNOSIS — R928 Other abnormal and inconclusive findings on diagnostic imaging of breast: Secondary | ICD-10-CM

## 2019-03-11 ENCOUNTER — Ambulatory Visit
Admission: RE | Admit: 2019-03-11 | Discharge: 2019-03-11 | Disposition: A | Discharge status: Home / Self Care | Payer: 59 | Source: Ambulatory Visit | Attending: Internal Medicine | Admitting: Internal Medicine

## 2019-03-11 DIAGNOSIS — N6320 Unspecified lump in the left breast, unspecified quadrant: Secondary | ICD-10-CM

## 2019-03-11 DIAGNOSIS — R928 Other abnormal and inconclusive findings on diagnostic imaging of breast: Secondary | ICD-10-CM

## 2019-03-11 DIAGNOSIS — C50912 Malignant neoplasm of unspecified site of left female breast: Secondary | ICD-10-CM

## 2019-03-11 HISTORY — PX: BREAST BIOPSY: SHX20

## 2019-03-11 HISTORY — DX: Malignant neoplasm of unspecified site of left female breast: C50.912

## 2019-03-15 ENCOUNTER — Other Ambulatory Visit: Payer: Self-pay | Admitting: Anatomic Pathology & Clinical Pathology

## 2019-03-15 ENCOUNTER — Encounter: Payer: Self-pay | Admitting: *Deleted

## 2019-03-15 NOTE — Progress Notes (Signed)
Called patient to establish navigation services.  No answer.  Left message for patient to return my call. 

## 2019-03-16 ENCOUNTER — Encounter: Payer: Self-pay | Admitting: *Deleted

## 2019-03-16 DIAGNOSIS — C50912 Malignant neoplasm of unspecified site of left female breast: Secondary | ICD-10-CM

## 2019-03-16 NOTE — Progress Notes (Signed)
Patient returned my call today.  We discussed navigation services.  She does not have a surgical preference or medical oncology preference.  Per her primary care providers preference, I have scheduled her to see Dr. Peyton Najjar at [facility] for a surgical consult on 03/18/19 @ 1:30, and a medical oncology consult with Dr. Tasia Catchings on 03/23/19 @ 9:30.  She can pick up her educational material at Dr. Deniece Ree office.  She is to call with any questions or needs.  Left her a message with appointment dates.

## 2019-03-18 ENCOUNTER — Ambulatory Visit: Payer: Self-pay | Admitting: General Surgery

## 2019-03-18 ENCOUNTER — Other Ambulatory Visit: Payer: Self-pay | Admitting: General Surgery

## 2019-03-18 DIAGNOSIS — C50012 Malignant neoplasm of nipple and areola, left female breast: Secondary | ICD-10-CM

## 2019-03-18 LAB — SURGICAL PATHOLOGY

## 2019-03-18 NOTE — H&P (Signed)
PATIENT PROFILE: Tammy Elliott is a 69 y.o. female who presents to the Clinic for consultation at the request of Dr. Edwina Barth for evaluation of left breast cancer.  PCP:  Velna Ochs, MD  HISTORY OF PRESENT ILLNESS: Tammy Elliott reports she had her usual screening mammogram.  Suspicious lesion was identified in the left breast.  Diagnostic mammogram and ultrasound was done.  6 mm distortion was identified on the left breast at the 1:30 position.  Stereotactic core biopsy was done.  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  Patient denies any palpable mass, any skin changes, any breast pain, any nipple retraction, nipple discharge.  Family history of breast cancer: None Family history of other cancers: None Menarche: 69 years old Menopause: 69 years old Used OCP: For 15 years Used estrogen and progesterone therapy: None History of Radiation to the chest: None Number of pregnancies: None  PROBLEM LIST:         Problem List  Date Reviewed: 07/24/2018         Noted   Pelvic pain in female Unknown   GERD (gastroesophageal reflux disease) 08/22/2014   CAD (coronary artery disease) 10/20/2013   Overview    Status post 2.5 x 15 mm.  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) Unknown   Chronic ischemic heart disease, unspecified Unknown   Basal cell carcinoma Unknown   Overview    S/P Removal         GENERAL REVIEW OF SYSTEMS:   General ROS: negative for - chills, fatigue, fever, positive for weight loss  Allergy and Immunology ROS: negative for - hives  Hematological and Lymphatic ROS: negative for - bleeding problems or bruising, negative for palpable nodes Endocrine ROS: negative for - heat or cold intolerance, hair changes Respiratory ROS:  negative for - cough, shortness of breath or wheezing Cardiovascular ROS: no chest pain or palpitations GI ROS: negative for nausea, vomiting, abdominal pain, diarrhea, constipation Musculoskeletal ROS: negative for - joint swelling or muscle pain Neurological ROS: negative for - confusion, syncope Dermatological ROS: negative for pruritus and rash Psychiatric: negative for anxiety, depression, difficulty sleeping and memory loss  MEDICATIONS: Current Medications        Current Outpatient Medications  Medication Sig Dispense Refill  . blood glucose meter kit Use 1 each as directed. 1 each 0  . calcium-magnesium-zinc 333-133-5 mg Tab Take 1 tablet by mouth once daily.    . clopidogreL (PLAVIX) 75 mg tablet TAKE 1 TABLET BY MOUTH ONCE DAILY 90 tablet 3  . glimepiride (AMARYL) 4 MG tablet TAKE 1 TABLET BY MOUTH TWO  TIMES DAILY 180 tablet 1  . JANUMET 50-500 mg tablet TAKE 1 TABLET BY MOUTH TWO  TIMES DAILY WITH MEALS 180 tablet 3  . JARDIANCE 25 mg tablet TAKE 1 TABLET(25 MG) BY MOUTH EVERY DAY 90 tablet 0  . lancets Use 1 each once daily. Use as instructed. 100 each 5  . lancing device with lancets kit Use 1 each once daily Use as instructed. 1 each 0  . lansoprazole (PREVACID) 15 MG DR capsule Take 1 capsule (15 mg total) by mouth once daily 90 capsule 1  . lovastatin (MEVACOR) 40 MG tablet TAKE 1 TABLET BY MOUTH  DAILY 90 tablet 3  . metFORMIN (GLUCOPHAGE)  500 MG tablet Take 1 tablet (500 mg total) by mouth 2 (two) times daily with meals 180 tablet 1  . multivitamin tablet Take 1 tablet by mouth once daily.    Glory Rosebush VERIO TEST STRIPS test strip USE AS DIRECTED 1 STRIP  ONCE DAILY 100 strip 3   No current facility-administered medications for this visit.       ALLERGIES: Byetta [exenatide], Penicillins, Thiazides, and Xenical [orlistat]  PAST MEDICAL HISTORY:     Past Medical History:  Diagnosis Date  . Basal cell carcinoma    S/P Removal  . Chronic ischemic  heart disease, unspecified   . Other and unspecified hyperlipidemia   . Type 2 diabetes mellitus (CMS-HCC)     PAST SURGICAL HISTORY:      Past Surgical History:  Procedure Laterality Date  . CHOLECYSTECTOMY       FAMILY HISTORY:      Family History  Problem Relation Age of Onset  . Coronary Artery Disease (Blocked arteries around heart) Mother   . Uterine cancer Mother   . Diabetes Mother   . High blood pressure (Hypertension) Mother   . Myocardial Infarction (Heart attack) Father   . Alcohol abuse Father   . Diabetes Father        insulin  . High blood pressure (Hypertension) Father   . Colon polyps Sister        Diagnosed in her 81s  . Diabetes Sister   . Diabetes Other        Almost all sisters are diabetic or pre-diabetic     SOCIAL HISTORY: Social History          Socioeconomic History  . Marital status: Married    Spouse name: Not on file  . Number of children: Not on file  . Years of education: Not on file  . Highest education level: Not on file  Occupational History  . Not on file  Social Needs  . Financial resource strain: Not on file  . Food insecurity    Worry: Not on file    Inability: Not on file  . Transportation needs    Medical: Not on file    Non-medical: Not on file  Tobacco Use  . Smoking status: Never Smoker  . Smokeless tobacco: Never Used  Substance and Sexual Activity  . Alcohol use: Not Currently    Alcohol/week: 0.0 standard drinks    Comment: Occasionally  . Drug use: No  . Sexual activity: Yes    Birth control/protection: Post-menopausal  Other Topics Concern  . 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 Also competes in shooting competitions.      PHYSICAL EXAM:    Vitals:   03/18/19 0809  BP: 124/73  Pulse: 78   Body mass index is 27.98 kg/m. Weight: 83.5 kg (184 lb)    GENERAL: Alert, active, oriented x3  HEENT: Pupils equal reactive to light. Extraocular movements are intact. Sclera clear. Palpebral conjunctiva normal red color.Pharynx clear.  NECK: Supple with no palpable mass and no adenopathy.  LUNGS: Sound clear with no rales rhonchi or wheezes.  HEART: Regular rhythm S1 and S2 without murmur.  BREAST: breasts appear normal, no suspicious masses, no skin or nipple changes or axillary nodes.  There is a small bruise on the lateral aspect of the left breast  ABDOMEN: Soft and depressible, nontender with  no palpable mass, no hepatomegaly.  EXTREMITIES: Well-developed well-nourished symmetrical with no dependent edema.  NEUROLOGICAL: Awake alert oriented, facial expression symmetrical, moving all extremities.  REVIEW OF DATA: I have reviewed the following data today:      No visits with results within 3 Month(s) from this visit.  Latest known visit with results is:  Appointment on 10/29/2018  Component Date Value  . SARS-COV-2, NAA - [facility] 10/29/2018 Not Detected     I personally evaluated the images of the screening mammogram, diagnostic mammogram and ultrasound and the clip placement confirmation mammogram.  ASSESSMENT: Tammy Elliott is a 69 y.o. female presenting for consultation for left breast cancer.    Patient was oriented again about the pathology results. Surgical alternatives were discussed with patient including partial vs total mastectomy. Surgical technique and post operative care was discussed with patient. Risk of surgery was discussed with patient including but not limited to: wound infection, seroma, hematoma, brachial plexopathy, mondor's disease (thrombosis of small veins of breast), chronic wound pain, breast lymphedema, altered sensation to the nipple and cosmesis among others.   Due to the density of the patient breast and the level of fissures of the breast cancer, bilateral MRI of the breast is recommended.   I discussed with the patient these recommendations and the patient agreed to proceed with MRI.  Patient will be evaluated by oncology next week.  Patient will be presenting with the wound for next Monday.  Malignant neoplasm of upper-outer quadrant of left female breast, unspecified estrogen receptor status (CMS-HCC) [C50.412]  PLAN: 1.  Left breast needle guided partial mastectomy with sentinel needle biopsy (19301, [id number]) 2. CBC, CMP 3. Cardiology clearance to hold Plavix for 7 days before surgery 4. Bilateral breast MRI to rule out other breast lesions due to lobular features 5. Contact us if has any question or concern.   Patient verbalized understanding, all questions were answered, and were agreeable with the plan outlined above.     Herbert Pun, MD  Electronically signed by Herbert Pun, MD

## 2019-03-19 ENCOUNTER — Other Ambulatory Visit: Payer: Self-pay | Admitting: General Surgery

## 2019-03-19 DIAGNOSIS — C50012 Malignant neoplasm of nipple and areola, left female breast: Secondary | ICD-10-CM

## 2019-03-23 ENCOUNTER — Inpatient Hospital Stay: Payer: 59

## 2019-03-23 ENCOUNTER — Inpatient Hospital Stay: Payer: 59 | Attending: Oncology | Admitting: Oncology

## 2019-03-23 ENCOUNTER — Other Ambulatory Visit: Payer: Self-pay

## 2019-03-23 ENCOUNTER — Encounter: Payer: Self-pay | Admitting: *Deleted

## 2019-03-23 ENCOUNTER — Encounter: Payer: Self-pay | Admitting: Oncology

## 2019-03-23 ENCOUNTER — Ambulatory Visit
Admission: RE | Admit: 2019-03-23 | Discharge: 2019-03-23 | Disposition: A | Discharge status: Home / Self Care | Payer: 59 | Source: Ambulatory Visit | Attending: General Surgery | Admitting: General Surgery

## 2019-03-23 VITALS — BP 115/72 | HR 82 | Temp 95.7°F | Resp 16 | Ht 68.0 in | Wt 184.6 lb

## 2019-03-23 DIAGNOSIS — Z955 Presence of coronary angioplasty implant and graft: Secondary | ICD-10-CM | POA: Insufficient documentation

## 2019-03-23 DIAGNOSIS — Z85828 Personal history of other malignant neoplasm of skin: Secondary | ICD-10-CM | POA: Insufficient documentation

## 2019-03-23 DIAGNOSIS — Z170 Estrogen receptor positive status [ER+]: Secondary | ICD-10-CM | POA: Insufficient documentation

## 2019-03-23 DIAGNOSIS — C50412 Malignant neoplasm of upper-outer quadrant of left female breast: Secondary | ICD-10-CM | POA: Insufficient documentation

## 2019-03-23 DIAGNOSIS — C50012 Malignant neoplasm of nipple and areola, left female breast: Secondary | ICD-10-CM

## 2019-03-23 DIAGNOSIS — E119 Type 2 diabetes mellitus without complications: Secondary | ICD-10-CM | POA: Insufficient documentation

## 2019-03-23 DIAGNOSIS — Z7984 Long term (current) use of oral hypoglycemic drugs: Secondary | ICD-10-CM | POA: Insufficient documentation

## 2019-03-23 DIAGNOSIS — Z833 Family history of diabetes mellitus: Secondary | ICD-10-CM | POA: Insufficient documentation

## 2019-03-23 DIAGNOSIS — Z8042 Family history of malignant neoplasm of prostate: Secondary | ICD-10-CM

## 2019-03-23 DIAGNOSIS — Z79899 Other long term (current) drug therapy: Secondary | ICD-10-CM | POA: Insufficient documentation

## 2019-03-23 DIAGNOSIS — C50912 Malignant neoplasm of unspecified site of left female breast: Secondary | ICD-10-CM

## 2019-03-23 DIAGNOSIS — E785 Hyperlipidemia, unspecified: Secondary | ICD-10-CM | POA: Diagnosis not present

## 2019-03-23 DIAGNOSIS — Z8249 Family history of ischemic heart disease and other diseases of the circulatory system: Secondary | ICD-10-CM | POA: Insufficient documentation

## 2019-03-23 LAB — CBC WITH DIFFERENTIAL/PLATELET
Abs Immature Granulocytes: 0.01 10*3/uL (ref 0.00–0.07)
Basophils Absolute: 0 10*3/uL (ref 0.0–0.1)
Basophils Relative: 1 %
Eosinophils Absolute: 0.1 10*3/uL (ref 0.0–0.5)
Eosinophils Relative: 2 %
HCT: 44.2 % (ref 36.0–46.0)
Hemoglobin: 14.2 g/dL (ref 12.0–15.0)
Immature Granulocytes: 0 %
Lymphocytes Relative: 27 %
Lymphs Abs: 1.5 10*3/uL (ref 0.7–4.0)
MCH: 30.6 pg (ref 26.0–34.0)
MCHC: 32.1 g/dL (ref 30.0–36.0)
MCV: 95.3 fL (ref 80.0–100.0)
Monocytes Absolute: 0.5 10*3/uL (ref 0.1–1.0)
Monocytes Relative: 9 %
Neutro Abs: 3.3 10*3/uL (ref 1.7–7.7)
Neutrophils Relative %: 61 %
Platelets: 227 10*3/uL (ref 150–400)
RBC: 4.64 MIL/uL (ref 3.87–5.11)
RDW: 12.2 % (ref 11.5–15.5)
WBC: 5.3 10*3/uL (ref 4.0–10.5)
nRBC: 0 % (ref 0.0–0.2)

## 2019-03-23 LAB — COMPREHENSIVE METABOLIC PANEL
ALT: 23 U/L (ref 0–44)
AST: 23 U/L (ref 15–41)
Albumin: 4.2 g/dL (ref 3.5–5.0)
Alkaline Phosphatase: 57 U/L (ref 38–126)
Anion gap: 12 (ref 5–15)
BUN: 18 mg/dL (ref 8–23)
CO2: 25 mmol/L (ref 22–32)
Calcium: 9.5 mg/dL (ref 8.9–10.3)
Chloride: 101 mmol/L (ref 98–111)
Creatinine, Ser: 0.97 mg/dL (ref 0.44–1.00)
GFR calc Af Amer: >60 mL/min (ref >60)
GFR calc non Af Amer: >60 mL/min (ref >60)
Glucose, Bld: 219 mg/dL — ABNORMAL HIGH (ref 70–99)
Potassium: 4.4 mmol/L (ref 3.5–5.1)
Sodium: 138 mmol/L (ref 135–145)
Total Bilirubin: 0.6 mg/dL (ref 0.3–1.2)
Total Protein: 7.6 g/dL (ref 6.5–8.1)

## 2019-03-23 MED ORDER — GADOBUTROL 1 MMOL/ML IV SOLN
8.0000 mL | Freq: Once | INTRAVENOUS | Status: AC | PRN
  Administered 2019-03-23: 8 mL via INTRAVENOUS

## 2019-03-23 NOTE — Progress Notes (Signed)
Hematology/Oncology Consult note [facility] Telephone:([phone] [phone]   Patient Care Team: Baxter Hire, MD as PCP - General (Internal Medicine) Leonie Man, MD as PCP - Cardiology (Cardiology) Rico Junker, RN as Registered Nurse Theodore Demark, RN as Registered Nurse  REFERRING PROVIDER: Lajean Manes, MD  CHIEF COMPLAINTS/REASON FOR VISIT:  Evaluation of breast cancer  HISTORY OF PRESENTING ILLNESS:   Tammy Elliott is a  69 y.o.  female with PMH listed below was seen in consultation at the request of  Lajean Manes, MD  for evaluation of breast cancer Patient had screening mammogram done on 02/16/2019 which showed left breast asymmetry with distortion in the upper outer quadrant. 03/03/2019 left diagnostic mammogram showed small asymmetry noted on the screening study appears to be a small ill-defined mass with associated subtle distortion.  1:30 position of the left breast.  7 cm from the nipple. Sonographic evaluation of the left axillary showed no enlarged or abnormal lymph nodes. Patient underwent stereotactic core needle biopsy of the mass. 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  Grade 2, ER > 90%, PR 1 to 10%, HER-2 negative. Patient denies any family history of breast cancer. Menarche 69 years old, menopause 69 years old Remote use of OCP for about 10 to 15 years, Never used hormone replacement therapy. Denies any history of radiation to the chest.   Review of Systems  Constitutional: Negative for appetite change, chills, fatigue and fever.  HENT:   Negative for hearing loss and voice change.   Eyes: Negative for eye problems.  Respiratory: Negative for chest tightness and cough.   Cardiovascular: Negative for chest pain.  Gastrointestinal: Negative for abdominal distention, abdominal pain and blood in stool.  Endocrine:  Negative for hot flashes.  Genitourinary: Negative for difficulty urinating and frequency.   Musculoskeletal: Negative for arthralgias.  Skin: Negative for itching and rash.  Neurological: Negative for extremity weakness.  Hematological: Negative for adenopathy.  Psychiatric/Behavioral: Negative for confusion.    MEDICAL HISTORY:  Past Medical History:  Diagnosis Date   Basal cell carcinoma    basal cell -skin cancer s/p removal   CAD S/P percutaneous coronary angioplasty 2012   Dr. Saralyn Pilar ([place], [place]): DES PCI to LAD   DM (diabetes mellitus), type 2 with complications ([place])    CAD   Hyperlipidemia associated with type 2 diabetes mellitus ([place])    On lovastatin    SURGICAL HISTORY: Past Surgical History:  Procedure Laterality Date   BREAST BIOPSY Left 03/11/2019   stereo biopsy/ x clip/path pending   CHOLECYSTECTOMY     CORONARY STENT INTERVENTION  2012   Dr. Saralyn Pilar ([place]): DES PCI pLAD   NM GATED MYOVIEW ([place] HX)  05/09/2017   [facility]) exercised 8:36 min with no chest pain or dyspnea.  No EKG changes.  Normal EF 66%.  No ischemia or infarction)    SOCIAL HISTORY: Social History   Socioeconomic History   Marital status: Married    Spouse name: Not on file   Number of children: Not on file   Years of education: Not on file   Highest education level: Not on file  Occupational History   Occupation: Analyst    Employer: [employer]  Tobacco Use   Smoking status: Never Smoker   Smokeless tobacco: Never Used  Substance and Sexual Activity   Alcohol use: Not Currently  Drug use: Never   Sexual activity: Not on file  Other Topics Concern   Not on file  Social History Narrative   reports that she has never smoked. She has never used smokeless tobacco. She reports that she rarely drinks alcohol. She reports that she does not use drugs.      Married to [name] ([name]) [name]     Social Determinants of Health   Financial Resource Strain:    Difficulty of Paying Living Expenses: Not on file  Food Insecurity:    Worried About Charity fundraiser in the Last Year: Not on file   [employer] of Food in the Last Year: Not on file  Transportation Needs:    Lack of Transportation (Medical): Not on file   Lack of Transportation (Non-Medical): Not on file  Physical Activity:    Days of Exercise per Week: Not on file   Minutes of Exercise per Session: Not on file  Stress:    Feeling of Stress : Not on file  Social Connections:    Frequency of Communication with Friends and Family: Not on file   Frequency of Social Gatherings with Friends and Family: Not on file   Attends Religious Services: Not on file   Active Member of Clubs or Organizations: Not on file   Attends Archivist Meetings: Not on file   Marital Status: Not on file  Intimate Partner Violence:    Fear of Current or Ex-Partner: Not on file   Emotionally Abused: Not on file   Physically Abused: Not on file   Sexually Abused: Not on file    FAMILY HISTORY: Family History  Problem Relation Age of Onset   CAD Mother    Diabetes Mellitus II Mother    Hypertension Mother    Uterine cancer Mother    CAD Father    Heart attack Father    Hypertension Father    Diabetes Mellitus II Father        Took insulin   Alcohol abuse Father    Diabetes Mellitus II Sister    Diabetes Other        Almost all sisters are diabetic or pre-diabetic    Breast cancer Neg Hx     ALLERGIES:  is allergic to exenatide; orlistat; penicillins; and thiazide-type diuretics.  MEDICATIONS:  Current Outpatient Medications  Medication Sig Dispense Refill   CALCIUM-MAGNESIUM-ZINC PO Take 1 tablet by mouth every evening.      clopidogrel (PLAVIX) 75 MG tablet Take 75 mg by mouth daily.      glimepiride (AMARYL) 4 MG tablet Take 4 mg by mouth 2 (two) times daily.      JANUMET 50-500 MG  tablet Take 1 tablet by mouth 2 (two) times daily.     JARDIANCE 25 MG TABS tablet Take 25 mg by mouth daily.      lansoprazole (PREVACID) 15 MG capsule Take 15 mg by mouth daily.      lovastatin (MEVACOR) 40 MG tablet Take 40 mg by mouth at bedtime.      metFORMIN (GLUCOPHAGE) 500 MG tablet Take 500 mg by mouth 2 (two) times daily.      Multiple Vitamin (MULTIVITAMIN) tablet Take 1 tablet by mouth daily.     No current facility-administered medications for this visit.     PHYSICAL EXAMINATION: ECOG PERFORMANCE STATUS: 0 - Asymptomatic Vitals:   03/23/19 0916  BP: 115/72  Pulse: 82  Resp: 16  Temp: (!) 95.7 F (35.4 C)  Filed Weights   03/23/19 0916  Weight: 184 lb 9.6 oz (83.7 kg)    Physical Exam Constitutional:      General: She is not in acute distress. HENT:     Head: Normocephalic and atraumatic.  Eyes:     General: No scleral icterus.    Pupils: Pupils are equal, round, and reactive to light.  Cardiovascular:     Rate and Rhythm: Normal rate and regular rhythm.     Heart sounds: Normal heart sounds.  Pulmonary:     Effort: Pulmonary effort is normal. No respiratory distress.     Breath sounds: No wheezing.  Abdominal:     General: Bowel sounds are normal. There is no distension.     Palpations: Abdomen is soft. There is no mass.     Tenderness: There is no abdominal tenderness.  Musculoskeletal:        General: No deformity. Normal range of motion.     Cervical back: Normal range of motion and neck supple.  Skin:    General: Skin is warm and dry.     Findings: No erythema or rash.  Neurological:     Mental Status: She is alert and oriented to person, place, and time.     Cranial Nerves: No cranial nerve deficit.     Coordination: Coordination normal.  Psychiatric:        Behavior: Behavior normal.        Thought Content: Thought content normal.   Breast examination revealed no palpable mass in bilateral breast.  No palpable axillary  lymphadenopathy.  Small bruise on the lateral aspect of the left breast.  No nipple changes.  LABORATORY DATA:  I have reviewed the data as listed Lab Results  Component Value Date   WBC 5.3 03/23/2019   HGB 14.2 03/23/2019   HCT 44.2 03/23/2019   MCV 95.3 03/23/2019   PLT 227 03/23/2019   Recent Labs    03/23/19 1005  NA 138  K 4.4  CL 101  CO2 25  GLUCOSE 219*  BUN 18  CREATININE 0.97  CALCIUM 9.5  GFRNONAA >60  GFRAA >60  PROT 7.6  ALBUMIN 4.2  AST 23  ALT 23  ALKPHOS 57  BILITOT 0.6   Iron/TIBC/Ferritin/ %Sat No results found for: IRON, TIBC, FERRITIN, IRONPCTSAT    RADIOGRAPHIC STUDIES: I have personally reviewed the radiological images as listed and agreed with the findings in the report.  MR BREAST BILATERAL W WO CONTRAST INC CAD  Result Date: 03/23/2019 CLINICAL DATA:  69 year old female with newly diagnosed left breast invasive mammary carcinoma with lobular features. LABS:  None performed on site. EXAM: BILATERAL BREAST MRI WITH AND WITHOUT CONTRAST TECHNIQUE: Multiplanar, multisequence MR images of both breasts were obtained prior to and following the intravenous administration of 8 ml of Gadavist. Three-dimensional MR images were rendered by post-processing of the original MR data on an independent workstation. The three-dimensional MR images were interpreted, and findings are reported in the following complete MRI report for this study. Three dimensional images were evaluated at the independent DynaCad workstation COMPARISON:  Previous exam(s). FINDINGS: Breast composition: c. Heterogeneous fibroglandular tissue. Background parenchymal enhancement: Mild. Right breast: No mass or abnormal enhancement. Left breast: Post biopsy changes are noted in the far posterior upper outer quadrant. This is consistent with the patient's site of known biopsy. Numerous, irregular enhancing masses are seen throughout the central left breast involving the majority of the upper  outer and upper inner quadrants. This spans greater  than 7 cm in the AP dimension and 6.5 cm in the transverse dimension. The appearance is highly concerning for multicentric disease. The largest mass is noted in the superior central aspect at middle depth (series 9, image 47/112). It measures 1.7 x 1.6 x 1.6 cm. A second 8 x 8 x 6 mm mass is seen at anterior depth (series 9, image 47/112). Lymph nodes: No abnormal appearing lymph nodes. Ancillary findings:  None. IMPRESSION: 1. Biopsy proven malignancy in the posterior, upper-outer left breast. 2. Numerous suspicious, enhancing masses throughout the upper inner and upper outer left breast concerning for multicentric disease. Suspicious findings span greater than 6 cm in both the AP and transverse dimensions. Recommendation is for MRI guided biopsy of the two largest/most suspicious masses (series 9, image 47/112) to evaluate extent of disease. 3. No MRI evidence of malignancy on the right. 4. No suspicious lymphadenopathy. RECOMMENDATION: Two area MRI guided biopsy of the left breast. BI-RADS CATEGORY  4: Suspicious. Electronically Signed   By: Kristopher Oppenheim M.D.   On: 03/23/2019 13:55   US BREAST LTD UNI LEFT INC AXILLA  Result Date: 03/03/2019 CLINICAL DATA:  Screening recall for a possible asymmetry with distortion in the left breast. EXAM: DIGITAL DIAGNOSTIC LEFT MAMMOGRAM WITH CAD AND TOMO ULTRASOUND LEFT BREAST COMPARISON:  Previous exam(s). ACR Breast Density Category c: The breast tissue is heterogeneously dense, which may obscure small masses. FINDINGS: The possible asymmetry with distortion noted on the screening study persists on spot compression imaging, best seen on the MLO view. There is no defined mass, but there is central density with associated subtle distortion. The central density measures approximately 5 mm in greatest dimension. Mammographic images were processed with CAD. On physical exam, no mass is palpated in the upper outer left  breast. Targeted ultrasound is performed, showing a subtle hypoechoic lesion in the left breast at 1:30 o'clock, 7 cm the nipple, with posterior acoustic shadowing, measuring 6 x 5 x 6 mm. This is consistent in size, shape and location to the mammographic finding. Sonographic evaluation of the left axilla shows no enlarged or abnormal lymph nodes. IMPRESSION: 1. The small asymmetry noted on the screening study appears to be a small ill-defined mass with associated subtle distortion, in the 1:30 o'clock position of the left breast, 7 cm the nipple. Although visualized sonographically, it is best seen mammographically. Stereotactic core needle biopsy is recommended. RECOMMENDATION: 1. Stereotactic core needle biopsy of the small mass and distortion in the upper outer left breast. I have discussed the findings and recommendations with the patient. If applicable, a reminder letter will be sent to the patient regarding the next appointment. BI-RADS CATEGORY  4: Suspicious. Electronically Signed   By: Lajean Manes M.D.   On: 03/03/2019 11:44   MM DIAG BREAST TOMO UNI LEFT  Result Date: 03/03/2019 CLINICAL DATA:  Screening recall for a possible asymmetry with distortion in the left breast. EXAM: DIGITAL DIAGNOSTIC LEFT MAMMOGRAM WITH CAD AND TOMO ULTRASOUND LEFT BREAST COMPARISON:  Previous exam(s). ACR Breast Density Category c: The breast tissue is heterogeneously dense, which may obscure small masses. FINDINGS: The possible asymmetry with distortion noted on the screening study persists on spot compression imaging, best seen on the MLO view. There is no defined mass, but there is central density with associated subtle distortion. The central density measures approximately 5 mm in greatest dimension. Mammographic images were processed with CAD. On physical exam, no mass is palpated in the upper outer left breast. Targeted ultrasound  is performed, showing a subtle hypoechoic lesion in the left breast at 1:30  o'clock, 7 cm the nipple, with posterior acoustic shadowing, measuring 6 x 5 x 6 mm. This is consistent in size, shape and location to the mammographic finding. Sonographic evaluation of the left axilla shows no enlarged or abnormal lymph nodes. IMPRESSION: 1. The small asymmetry noted on the screening study appears to be a small ill-defined mass with associated subtle distortion, in the 1:30 o'clock position of the left breast, 7 cm the nipple. Although visualized sonographically, it is best seen mammographically. Stereotactic core needle biopsy is recommended. RECOMMENDATION: 1. Stereotactic core needle biopsy of the small mass and distortion in the upper outer left breast. I have discussed the findings and recommendations with the patient. If applicable, a reminder letter will be sent to the patient regarding the next appointment. BI-RADS CATEGORY  4: Suspicious. Electronically Signed   By: Lajean Manes M.D.   On: 03/03/2019 11:44   MM CLIP PLACEMENT LEFT  Result Date: 03/11/2019 CLINICAL DATA:  Status post stereotactic biopsy for asymmetry/distortion in the upper outer quadrant of the LEFT breast. EXAM: DIAGNOSTIC LEFT MAMMOGRAM POST STEREOTACTIC BIOPSY COMPARISON:  Previous exam(s). FINDINGS: Mammographic images were obtained following stereotactic guided biopsy of asymmetry/distortion in the upper-outer quadrant of the LEFT breast. The biopsy marking clip is in expected position at the site of biopsy. IMPRESSION: Appropriate positioning of the X shaped biopsy marking clip at the site of biopsy in the upper-outer quadrant of the LEFT breast. Final Assessment: Post Procedure Mammograms for Marker Placement Electronically Signed   By: Franki Cabot M.D.   On: 03/11/2019 11:12   MM LT BREAST BX W LOC DEV 1ST LESION IMAGE BX SPEC STEREO GUIDE  Addendum Date: 03/16/2019   ADDENDUM REPORT: 03/16/2019 12:08 ADDENDUM: PATHOLOGY revealed: A. BREAST, LEFT UPPER OUTER QUADRANT; STEREOTACTIC CORE BIOPSY: -  INVASIVE MAMMARY CARCINOMA, WITH LOBULAR FEATURES. - LOBULAR CARCINOMA IN SITU WITH DUCTAL INVOLVEMENT. 6 mm in this sample. Grade 2. Ductal carcinoma in situ: Not identified. Lymphovascular invasion: Not identified. Pathology results are CONCORDANT with imaging findings, per Dr. Franki Cabot. I telephoned patient on 03/15/2019 and discussed biopsy results and recommendations stated below. All questions were answered. Patient denies significant pain or bleeding from the biopsy site. Biopsy site care instructions were reviewed and patient was instructed to call [facility] with any concerns or questions related to the biopsy. Recommendation: Surgical referral and bilateral breast MRI given lobular histology. Request for surgical referral was relayed to nurse navigators at [facility] by Electa Sniff RN on 03/15/2019. Addendum by Electa Sniff RN on 03/16/2019. Electronically Signed   By: Lovey Newcomer M.D.   On: 03/16/2019 12:08   Result Date: 03/16/2019 CLINICAL DATA:  Patient with asymmetry/distortion in the upper LEFT breast presents today for stereotactic biopsy using 3D tomosynthesis guidance. EXAM: LEFT BREAST STEREOTACTIC CORE NEEDLE BIOPSY COMPARISON:  Previous exams. FINDINGS: The patient and I discussed the procedure of stereotactic-guided biopsy including benefits and alternatives. We discussed the high likelihood of a successful procedure. We discussed the risks of the procedure including infection, bleeding, tissue injury, clip migration, and inadequate sampling. Informed written consent was given. The usual time out protocol was performed immediately prior to the procedure. Using sterile technique and 1% Lidocaine as local anesthetic, under stereotactic guidance, a 9 gauge vacuum assisted device was used to perform core needle biopsy of the asymmetry/distortion in the upper outer quadrant of the left breast using a lateral approach.  Lesion quadrant: Upper outer quadrant At the  conclusion of the procedure, X shaped tissue marker clip was deployed into the biopsy cavity. Follow-up 2-view mammogram was performed and dictated separately. IMPRESSION: Stereotactic-guided biopsy of asymmetry/distortion within the upper outer quadrant of the LEFT breast. No apparent complications. Electronically Signed: By: Franki Cabot M.D. On: 03/11/2019 11:06      ASSESSMENT & PLAN:  1. Malignant neoplasm of upper-outer quadrant of left breast in female, estrogen receptor positive ([place])    #Images and pathology were reviewed and discussed with patient. Diagnosis of left breast invasive breast cancer was discussed. Given that the invasive mammary carcinoma has lobular features, I recommend bilateral MRI breast for further evaluation. Clinically lymph nodes are negative.  If MRI does not add any additional information, patient has cT1b cN0 M0 stage I breast cancer with lobular features. Recommend upfront lumpectomy with sentinel lymph node biopsy. If pathologically no lymph node involvement, will recommend Oncotype DX to be sent for further evaluation of adjuvant chemotherapy benefit. Also discussed with patient briefly about adjuvant radiation therapy and adjuvant antihormonal therapy. Patient has an appointment with Dr. Peyton Najjar on 03/18/2019.  Note was reviewed. Obtain CBC, CMP, CA 27-29, CA 15.3 Orders Placed This Encounter  Procedures   CBC with Differential    Standing Status:   Future    Number of Occurrences:   1    Standing Expiration Date:   03/22/2020   Comprehensive metabolic panel    Standing Status:   Future    Number of Occurrences:   1    Standing Expiration Date:   03/22/2020   Cancer antigen 27.29    Standing Status:   Future    Number of Occurrences:   1    Standing Expiration Date:   03/22/2020   Cancer antigen 15-3    Standing Status:   Future    Number of Occurrences:   1    Standing Expiration Date:   03/22/2020    All questions were answered. The  patient knows to call the clinic with any problems questions or concerns.  cc Lajean Manes, MD    Return of visit: To be determined.  Discussed with breast cancer RN navigator Vita Barley will coordinate patient to follow-up after surgery to go over pathology report and adjuvant treatment plans. Thank you for this kind referral and the opportunity to participate in the care of this patient. A copy of today's note is routed to referring provider   Earlie Server, MD, PhD Hematology Oncology [facility] at [facility] Pager- [phone] 03/23/2019

## 2019-03-23 NOTE — Progress Notes (Signed)
Met patient today during her initial medical oncology follow up with Dr. Tasia Catchings.  Patient is scheduled for breast MRI today and surgery on 03/29/19.  Dr. Tasia Catchings would like to send either Oncotype DX or Mammoprint testing on the surgical specimen, and follow up with patient when those results are available.  Will put reminder on my calendar to look for results.  Patient to call with any questions or needs.

## 2019-03-24 ENCOUNTER — Other Ambulatory Visit: Payer: Self-pay

## 2019-03-24 ENCOUNTER — Encounter
Admission: RE | Admit: 2019-03-24 | Discharge: 2019-03-24 | Disposition: A | Discharge status: Home / Self Care | Payer: 59 | Source: Ambulatory Visit | Attending: General Surgery | Admitting: General Surgery

## 2019-03-24 HISTORY — DX: Gastro-esophageal reflux disease without esophagitis: K21.9

## 2019-03-24 LAB — CANCER ANTIGEN 15-3: CA 15-3: 13.1 U/mL (ref 0.0–25.0)

## 2019-03-24 LAB — CANCER ANTIGEN 27.29: CA 27.29: 14.6 U/mL (ref 0.0–38.6)

## 2019-03-24 NOTE — Patient Instructions (Signed)
Your procedure is scheduled on: 03/29/19 Report to [facility] AT 7:45 AM .  Remember: Instructions that are not followed completely may result in serious medical risk, up to and including death, or upon the discretion of your surgeon and anesthesiologist your surgery may need to be rescheduled.     _X__ 1. Do not eat food after midnight the night before your procedure.                 No gum chewing or hard candies. You may drink clear liquids up to 2 hours                 before you are scheduled to arrive for your surgery- DO not drink clear                 liquids within 2 hours of the start of your surgery.                 Clear Liquids include:  water, apple juice without pulp, clear carbohydrate                 drink such as Clearfast or Gatorade, Black Coffee or Tea (Do not add                 anything to coffee or tea). Diabetics water only  __X__2.  On the morning of surgery brush your teeth with toothpaste and water, you                 may rinse your mouth with mouthwash if you wish.  Do not swallow any              toothpaste of mouthwash.     _X__ 3.  No Alcohol for 24 hours before or after surgery.   _X__ 4.  Do Not Smoke or use e-cigarettes For 24 Hours Prior to Your Surgery.                 Do not use any chewable tobacco products for at least 6 hours prior to                 surgery.  ____  5.  Bring all medications with you on the day of surgery if instructed.   __X__  6.  Notify your doctor if there is any change in your medical condition      (cold, fever, infections).     Do not wear jewelry, make-up, hairpins, clips or nail polish. Do not wear lotions, powders, or perfumes.  Do not shave 48 hours prior to surgery. Men may shave face and neck. Do not bring valuables to the hospital.    [facility] is not responsible for any belongings or valuables.  Contacts, dentures/partials or body piercings may not be worn into surgery. Bring a case for your contacts,  glasses or hearing aids, a denture cup will be supplied. Leave your suitcase in the car. After surgery it may be brought to your room. For patients admitted to the hospital, discharge time is determined by your treatment team.   Patients discharged the day of surgery will not be allowed to drive home.   Please read over the following fact sheets that you were given:   MRSA Information  __X__ Take these medicines the morning of surgery with A SIP OF WATER:    1. PREVACID  2.   3.   4.  5.  6.  ____ Fleet Enema (  as directed)   __X__ Use CHG Soap/SAGE wipes as directed  ____ Use inhalers on the day of surgery  __X__ Stop metformin/Janumet/Farxiga 2 days prior to surgery    ____ Take 1/2 of usual insulin dose the night before surgery. No insulin the morning          of surgery.   ____ Stop Blood Thinners Coumadin/Plavix/Xarelto/Pleta/Pradaxa/Eliquis/Effient/Aspirin  on   Or contact your Surgeon, Cardiologist or Medical Doctor regarding  ability to stop your blood thinners  __X__ Stop Anti-inflammatories 7 days before surgery such as Advil, Ibuprofen, Motrin,  BC or Goodies Powder, Naprosyn, Naproxen, Aleve, Aspirin    __X__ Stop all herbal supplements, fish oil or vitamin E until after surgery.    ____ Bring C-Pap to the hospital.      YOUR DRIVE THRU COVID TEST IS SCHEDULED FOR Thursday 03/25/19 IN FRONT OF THE [facility]. YOU ARE SCHEDULED FOR AN EKG TEST IN PRE ADMIT TESTING BEFORE YOUR COVID TEST. YOU WILL NEED TO ENTER THROUGH THE MEDICAL MALL ENTRANCE TO GET TO PRE ADMIT TESTING.

## 2019-03-25 ENCOUNTER — Other Ambulatory Visit: Admission: RE | Admit: 2019-03-25 | Payer: 59 | Source: Ambulatory Visit

## 2019-03-25 ENCOUNTER — Encounter
Admission: RE | Admit: 2019-03-25 | Discharge: 2019-03-25 | Disposition: A | Discharge status: Home / Self Care | Payer: 59 | Source: Ambulatory Visit | Attending: General Surgery | Admitting: General Surgery

## 2019-03-25 DIAGNOSIS — Z01818 Encounter for other preprocedural examination: Secondary | ICD-10-CM | POA: Diagnosis present

## 2019-03-25 DIAGNOSIS — Z20822 Contact with and (suspected) exposure to covid-19: Secondary | ICD-10-CM | POA: Diagnosis not present

## 2019-03-25 DIAGNOSIS — E118 Type 2 diabetes mellitus with unspecified complications: Secondary | ICD-10-CM | POA: Diagnosis not present

## 2019-03-25 LAB — SARS CORONAVIRUS 2 (TAT 6-24 HRS): SARS Coronavirus 2: NEGATIVE

## 2019-03-29 ENCOUNTER — Ambulatory Visit: Payer: 59

## 2019-03-29 ENCOUNTER — Encounter: Admission: RE | Payer: Self-pay | Source: Home / Self Care

## 2019-03-29 ENCOUNTER — Ambulatory Visit: Admission: RE | Admit: 2019-03-29 | Payer: 59 | Source: Home / Self Care | Admitting: General Surgery

## 2019-03-29 SURGERY — PARTIAL MASTECTOMY WITH NEEDLE LOCALIZATION AND AXILLARY SENTINEL LYMPH NODE BX
Anesthesia: General | Site: Breast | Laterality: Left

## 2019-03-30 ENCOUNTER — Other Ambulatory Visit: Payer: Self-pay | Admitting: General Surgery

## 2019-03-30 DIAGNOSIS — R9389 Abnormal findings on diagnostic imaging of other specified body structures: Secondary | ICD-10-CM

## 2019-04-07 ENCOUNTER — Other Ambulatory Visit: Payer: Self-pay | Admitting: Body Imaging

## 2019-04-07 ENCOUNTER — Ambulatory Visit
Admission: RE | Admit: 2019-04-07 | Discharge: 2019-04-07 | Disposition: A | Discharge status: Home / Self Care | Payer: 59 | Source: Ambulatory Visit | Attending: General Surgery | Admitting: General Surgery

## 2019-04-07 ENCOUNTER — Other Ambulatory Visit: Payer: Self-pay

## 2019-04-07 DIAGNOSIS — R9389 Abnormal findings on diagnostic imaging of other specified body structures: Secondary | ICD-10-CM

## 2019-04-07 MED ORDER — GADOBUTROL 1 MMOL/ML IV SOLN
8.0000 mL | Freq: Once | INTRAVENOUS | Status: AC | PRN
  Administered 2019-04-07: 10:00:00 8 mL via INTRAVENOUS

## 2019-04-16 ENCOUNTER — Encounter: Payer: Self-pay | Admitting: Plastic Surgery

## 2019-04-16 ENCOUNTER — Other Ambulatory Visit: Payer: Self-pay

## 2019-04-16 ENCOUNTER — Encounter: Payer: Self-pay | Admitting: *Deleted

## 2019-04-16 ENCOUNTER — Ambulatory Visit: Payer: 59 | Admitting: Plastic Surgery

## 2019-04-16 VITALS — BP 114/69 | HR 85 | Temp 96.6°F | Ht 68.0 in | Wt 183.2 lb

## 2019-04-16 DIAGNOSIS — Z170 Estrogen receptor positive status [ER+]: Secondary | ICD-10-CM

## 2019-04-16 DIAGNOSIS — C50412 Malignant neoplasm of upper-outer quadrant of left female breast: Secondary | ICD-10-CM | POA: Diagnosis not present

## 2019-04-16 NOTE — Progress Notes (Addendum)
Patient ID: Tammy Elliott, female    DOB: 10-16-1950, 69 y.o.   MRN: [id number]   Chief Complaint  Patient presents with  . Breast Cancer    The patient is a 69 year old female here for consultation for breast reconstruction.  She underwent a screening mammogram in December 2020.  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  It was grade 2, Estrogen and Progesterone positive and HER-2 negative.  The main site was upper outer quadrant of the left breast.  A subsequent MRI was done which showed other concerning areas.  As a result she has prepared herself for a mastectomy instead of the original lumpectomy.  She does have bruising of the left breast from the biopsy.  She has grade 3 ptosis of both breasts.  She is fairly symmetric in her breast size.  She has diabetes and hearty disease.  She is not a smoker. She is not sure if she will need radiation.   Review of Systems  Constitutional: Negative for activity change and appetite change.  Eyes: Negative for discharge.  Respiratory: Negative for chest tightness.   Endocrine: Negative.   Genitourinary: Negative.   Musculoskeletal: Negative.   Skin: Positive for color change. Negative for wound.  Neurological: Negative.   Psychiatric/Behavioral: Negative.     Past Medical History:  Diagnosis Date  . Basal cell carcinoma    basal cell -skin cancer s/p removal  . CAD S/P percutaneous coronary angioplasty 2012   Dr. Saralyn Pilar ([place], [place]): DES PCI to LAD  . DM (diabetes mellitus), type 2 with complications (HCC)    CAD  . GERD (gastroesophageal reflux disease)   . Hyperlipidemia associated with type 2 diabetes mellitus ([place])    On lovastatin    Past Surgical History:  Procedure Laterality Date  . BREAST BIOPSY Left 03/11/2019   stereo biopsy/ x clip/path  pending  . CARDIAC CATHETERIZATION    . CHOLECYSTECTOMY    . COLONOSCOPY    . CORONARY STENT INTERVENTION  2012   Dr. Saralyn Pilar ([place]): DES PCI pLAD  . NM GATED MYOVIEW (ARMX HX)  05/09/2017   [facility]) exercised 8:36 min with no chest pain or dyspnea.  No EKG changes.  Normal EF 66%.  No ischemia or infarction)      Current Outpatient Medications:  .  CALCIUM-MAGNESIUM-ZINC PO, Take 1 tablet by mouth every evening. , Disp: , Rfl:  .  clopidogrel (PLAVIX) 75 MG tablet, Take 75 mg by mouth daily. , Disp: , Rfl:  .  glimepiride (AMARYL) 4 MG tablet, Take 4 mg by mouth 2 (two) times daily. , Disp: , Rfl:  .  JANUMET 50-500 MG tablet, Take 1 tablet by mouth 2 (two) times daily., Disp: , Rfl:  .  JARDIANCE 25 MG TABS tablet, Take 25 mg by mouth daily. , Disp: , Rfl:  .  lansoprazole (PREVACID) 15 MG capsule, Take 15 mg by mouth daily. , Disp: , Rfl:  .  lovastatin (MEVACOR) 40 MG tablet, Take 40 mg by mouth at bedtime. , Disp: , Rfl:  .  metFORMIN (GLUCOPHAGE) 500 MG tablet, Take 500 mg by mouth 2 (two) times daily. , Disp: , Rfl:  .  Multiple Vitamin (MULTIVITAMIN) tablet, Take 1 tablet by mouth daily., Disp: , Rfl:    Objective:   Vitals:  04/16/19 1305  BP: 114/69  Pulse: 85  Temp: (!) 96.6 F (35.9 C)  SpO2: 99%    Physical Exam Vitals and nursing note reviewed.  Constitutional:      Appearance: Normal appearance.  HENT:     Head: Normocephalic and atraumatic.  Eyes:     Extraocular Movements: Extraocular movements intact.  Cardiovascular:     Rate and Rhythm: Normal rate.     Pulses: Normal pulses.  Pulmonary:     Effort: Pulmonary effort is normal.  Abdominal:     General: Abdomen is flat. There is no distension.  Skin:    General: Skin is warm.  Neurological:     General: No focal deficit present.     Mental Status: She is alert and oriented to person, place, and time.  Psychiatric:        Mood and Affect: Mood normal.         Behavior: Behavior normal.        Thought Content: Thought content normal.        Judgment: Judgment normal.     Assessment & Plan:  Malignant neoplasm of upper-outer quadrant of left breast in female, estrogen receptor positive ([place])  We had a detailed conversation about the patient's options for breast reconstruction. Several reconstruction options were explained to the patient.  It is important to remember that breast reconstruction is an optional procedure. Reconstruction often requires several stages of surgery and this means more than one operation.  The surgeries are often done several months apart.  The entire process from start to finish can take a year or more. The major goal of breast reconstruction is to look normal in clothing. There will always be scars and a difference noticeable without clothes.  This is true for asymmetries where both breasts will not be identical.  Surgery may be needed or desired to the non-cancerous breast in order to achieve better symmetry and satisfactory results.  Regardless of the reconstructive method, there is always risks and the possibility that the procedure will fail or have complications.  This couls required additional surgeries.    We discussed the available methods of breast reconstruction and included:  1. Tissue expander with Acellular dermal matrix followed by implant based reconstruction. This can be done as one surgery or multiple surgeries.  2. Autologous reconstruction can include using a muscle or tissue from another area of the body for the reconstruction.  3. Combined procedures like the latissismus dorsi flaps that often uses the muscle with an expander or implant.  For each of the method discussed the risks, benefits, scars and recovery time were discussed in detail. Specific risks included bleeding, infection, hematoma, seroma, scarring, pain, wound healing complications, flap loss, fat necrosis, capsular contracture, need for  implant removal, donor site complications, bulge, hernia, umbilical necrosis, need for urgent reoperation, and need for dressing changes.   After the options were discussed we focused on the patient's desires and the procedure that was best for her based on all the information.  A total of 50 minutes of face-to-face time was spent in this encounter, of which >50% was spent in counseling.    After a lengthy discussion and thorough review of the lab work as described above the patient has decided on a left mastectomy with immediate reconstruction with an expander and Flex HD placement.  She understands is a 24-monthprocess for which the implant would be in place approximately 3 months later.  She would then have the  option of nipple areolar reconstruction.  Due to her level of ptosis I do not recommend trying to salvage the nipple areola.  I spoke with Dr. Windell Moment and we have discussed the above information and plan.  Pictures were obtained of the patient and placed in the chart with the patient's or guardian's permission.   Wallace Going, DO   The 21st Century Cures Act was signed into law in 2016 which includes the topic of electronic health records.  This provides immediate access to information in MyChart.  This includes consultation notes, operative notes, office notes, lab results and pathology reports.  If you have any questions about what you read please let us know at your next visit or call us at the office.  We are right here with you.

## 2019-04-20 ENCOUNTER — Telehealth: Payer: Self-pay

## 2019-04-20 NOTE — Telephone Encounter (Signed)
Disability forms from [place] not appropriate for office at this time. Forwarded to Dr. Deniece Ree office. Patient aware and will follow up with his office.

## 2019-04-23 ENCOUNTER — Other Ambulatory Visit: Payer: Self-pay

## 2019-04-23 ENCOUNTER — Encounter
Admission: RE | Admit: 2019-04-23 | Discharge: 2019-04-23 | Disposition: A | Discharge status: Home / Self Care | Payer: 59 | Source: Ambulatory Visit | Attending: General Surgery | Admitting: General Surgery

## 2019-04-23 HISTORY — DX: Pure hypercholesterolemia, unspecified: E78.00

## 2019-04-23 NOTE — Patient Instructions (Signed)
Your procedure is scheduled on: May 03, 2019 AT 7:45 AM [facility].   REMEMBER: Instructions that are not followed completely may result in serious medical risk, up to and including death; or upon the discretion of your surgeon and anesthesiologist your surgery may need to be rescheduled.  Do not eat food after midnight the night before surgery.  No gum chewing, lozengers or hard candies.  You may however, drink CLEAR liquids up to 2 hours before you are scheduled to arrive for your surgery. Do not drink anything within 2 hours of the start of your surgery.  Clear liquids include: - water   Do NOT drink anything that is not on this list.  Type 1 and Type 2 diabetics should only drink water.  No Alcohol for 24 hours before or after surgery.  No Smoking including e-cigarettes for 24 hours prior to surgery.  No chewable tobacco products for at least 6 hours prior to surgery.  No nicotine patches on the day of surgery.  On the morning of surgery brush your teeth with toothpaste and water, you may rinse your mouth with mouthwash if you wish. Do not swallow any toothpaste or mouthwash.  Notify your doctor if there is any change in your medical condition (cold, fever, infection).  Do not wear jewelry, make-up, hairpins, clips or nail polish.  Do not wear lotions, powders, or perfumes or deodorant  Do not shave 48 hours prior to surgery.   Contacts and dentures may not be worn into surgery.  Do not bring valuables to the hospital, including drivers license, insurance or credit cards.  [facility] is not responsible for any belongings or valuables.   TAKE THESE MEDICATIONS THE MORNING OF SURGERY: PREVACID (take one the night before and one on the morning of surgery - helps to prevent nausea after surgery.)  Use CHG Soap  as directed on instruction sheet.  Stop Metformin 2 days prior to surgery. LAST DOSE April 30, 2019   Follow recommendations from  Cardiologist, Pulmonologist or PCP regarding stopping Aspirin, Coumadin, Plavix, Eliquis, Pradaxa, or Pletal. LAST DOSE OF PLAVIX 04/27/2019  Stop Anti-inflammatories (NSAIDS) such as Advil, Aleve, Ibuprofen, Motrin, Naproxen, Naprosyn and Aspirin based products such as Excedrin, Goodys Powder, BC Powder. (May take Tylenol or Acetaminophen if needed.)  Stop ANY OVER THE COUNTER supplements until after surgery. (May continue Vitamin D, Vitamin B, and multivitamin.)  Wear comfortable clothing (specific to your surgery type) to the hospital.  Plan for stool softeners for home use.  If you are being admitted to the hospital overnight, leave your suitcase in the car. After surgery it may be brought to your room.  If you are being discharged the day of surgery, you will not be allowed to drive home. You will need a responsible adult to drive you home and stay with you that night.   If you are taking public transportation, you will need to have a responsible adult with you. Please confirm with your physician that it is acceptable to use public transportation.   Please call [phone] if you have any questions about these instructions.

## 2019-04-26 NOTE — Progress Notes (Signed)
ICD-10-CM   1. Malignant neoplasm of upper-outer quadrant of left breast in female, estrogen receptor positive [facility])  C50.412    Z17.0       Patient ID: Tammy Elliott, female    DOB: [birth year], 69 y.o.   MRN: [id number]   History of Present Illness: Tammy Elliott is a 69 y.o.  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  She presents for preoperative evaluation for upcoming procedure, left mastectomy with Dr. Windell Moment and left breast reconstruction with placement of tissue expander and Flex HD with Dr. Marla Roe, scheduled for 05/03/2019.  PMH significant for T2DM, stent in LAD (Dr. Saralyn Pilar) & taking Plavix, and HLD. Grade 3 ptosis of both breasts. Denies personal and family history of blood clots.  The patient has not had problems with anesthesia.   Past Medical History: Allergies: Allergies  Allergen Reactions  . Exenatide Nausea Only  . Orlistat     Other reaction(s): Unknown  . Penicillins Hives and Swelling  . Thiazide-Type Diuretics     Other reaction(s): Unknown    Current Medications:  Current Outpatient Medications:  .  CALCIUM-MAGNESIUM-ZINC PO, Take 1 tablet by mouth every evening. , Disp: , Rfl:  .  clopidogrel (PLAVIX) 75 MG tablet, Take 75 mg by mouth daily. , Disp: , Rfl:  .  glimepiride (AMARYL) 4 MG tablet, Take 4 mg by mouth 2 (two) times daily. , Disp: , Rfl:  .  JANUMET 50-500 MG tablet, Take 1 tablet by mouth 2 (two) times daily., Disp: , Rfl:  .  JARDIANCE 25 MG TABS tablet, Take 25 mg by mouth daily. , Disp: , Rfl:  .  lansoprazole (PREVACID) 15 MG capsule, Take 15 mg by mouth daily. , Disp: , Rfl:  .  lovastatin (MEVACOR) 40 MG tablet, Take 40 mg by mouth at bedtime. , Disp: , Rfl:  .  metFORMIN (GLUCOPHAGE) 500 MG tablet, Take 500 mg by mouth 2 (two) times daily. , Disp: , Rfl:  .  Multiple Vitamin  (MULTIVITAMIN) tablet, Take 1 tablet by mouth daily., Disp: , Rfl:   Past Medical Problems: Past Medical History:  Diagnosis Date  . Basal cell carcinoma    basal cell -skin cancer s/p removal  . CAD S/P percutaneous coronary angioplasty 2012   Dr. Saralyn Pilar ([place], [place]): DES PCI to LAD  . DM (diabetes mellitus), type 2 with complications (HCC)    CAD  . GERD (gastroesophageal reflux disease)   . Hypercholesterolemia   . Hyperlipidemia associated with type 2 diabetes mellitus ([place])    On lovastatin    Past Surgical History: Past Surgical History:  Procedure Laterality Date  . BREAST BIOPSY Left 03/11/2019   stereo biopsy/ x clip/path pending  . CARDIAC CATHETERIZATION    . CHOLECYSTECTOMY    . COLONOSCOPY    . CORONARY STENT INTERVENTION  2012   Dr. Saralyn Pilar ([place]): DES PCI pLAD  . NM GATED MYOVIEW (ARMX HX)  05/09/2017   [facility]) exercised 8:36 min with no chest pain or dyspnea.  No EKG changes.  Normal EF 66%.  No ischemia or infarction)    Social History: Social History   Socioeconomic History  . Marital status: Married    Spouse name: Not on file  . Number of children: Not on file  . Years of education: Not on file  .  Highest education level: Not on file  Occupational History  . Occupation: Printmaker: [employer]  Tobacco Use  . Smoking status: Never Smoker  . Smokeless tobacco: Never Used  Substance and Sexual Activity  . Alcohol use: Not Currently  . Drug use: Never  . Sexual activity: Not on file  Other Topics Concern  . Not on file  Social History Narrative   reports that she has never smoked. She has never used smokeless tobacco. She reports that she rarely drinks alcohol. She reports that she does not use drugs.      Married to [name] ([name]) [name]    Social Determinants of Health   Financial Resource Strain:   . Difficulty of Paying Living Expenses: Not on file  Food  Insecurity:   . Worried About Charity fundraiser in the Last Year: Not on file  . Ran Out of Food in the Last Year: Not on file  Transportation Needs:   . Lack of Transportation (Medical): Not on file  . Lack of Transportation (Non-Medical): Not on file  Physical Activity:   . Days of Exercise per Week: Not on file  . Minutes of Exercise per Session: Not on file  Stress:   . Feeling of Stress : Not on file  Social Connections:   . Frequency of Communication with Friends and Family: Not on file  . Frequency of Social Gatherings with Friends and Family: Not on file  . Attends Religious Services: Not on file  . Active Member of Clubs or Organizations: Not on file  . Attends Archivist Meetings: Not on file  . Marital Status: Not on file  Intimate Partner Violence:   . Fear of Current or Ex-Partner: Not on file  . Emotionally Abused: Not on file  . Physically Abused: Not on file  . Sexually Abused: Not on file    Family History: Family History  Problem Relation Age of Onset  . CAD Mother   . Diabetes Mellitus II Mother   . Hypertension Mother   . Uterine cancer Mother   . CAD Father   . Heart attack Father   . Hypertension Father   . Diabetes Mellitus II Father        Took insulin  . Alcohol abuse Father   . Diabetes Mellitus II Sister   . Diabetes Other        Almost all sisters are diabetic or pre-diabetic   . Breast cancer Neg Hx     Review of Systems: Review of Systems  Constitutional: Negative for chills and fever.  HENT: Negative for congestion and sore throat.   Respiratory: Negative for cough and shortness of breath.   Cardiovascular: Negative for chest pain and leg swelling.  Gastrointestinal: Negative for abdominal pain, nausea and vomiting.  Musculoskeletal: Negative for back pain, joint pain, myalgias and neck pain.  Skin: Negative for itching and rash.    Physical Exam: Vital Signs BP 136/77 (BP Location: Left Arm, Patient Position:  Sitting, Cuff Size: Normal)   Pulse 87   Temp (!) 97.3 F (36.3 C) (Temporal)   Ht _0  (1.727 m)   Wt 177 lb (80.3 kg)   SpO2 100%   BMI 26.91 kg/m  Physical Exam Constitutional:      Appearance: Normal appearance. She is normal weight.  HENT:     Head: Normocephalic and atraumatic.  Eyes:     Extraocular Movements: Extraocular movements intact.  Cardiovascular:  Rate and Rhythm: Normal rate and regular rhythm.     Pulses: Normal pulses.     Heart sounds: Normal heart sounds.  Pulmonary:     Effort: Pulmonary effort is normal.     Breath sounds: Normal breath sounds. No wheezing, rhonchi or rales.  Abdominal:     General: Bowel sounds are normal.     Palpations: Abdomen is soft.  Musculoskeletal:        General: No swelling. Normal range of motion.     Cervical back: Normal range of motion.  Skin:    General: Skin is warm and dry.     Coloration: Skin is not pale.     Findings: No erythema or rash.  Neurological:     General: No focal deficit present.     Mental Status: She is alert and oriented to person, place, and time.  Psychiatric:        Mood and Affect: Mood normal.        Behavior: Behavior normal.        Thought Content: Thought content normal.        Judgment: Judgment normal.     Assessment/Plan:  Ms. Yorks scheduled for left mastectomy with Dr. Windell Moment and immediate reconstruction with placement of tissue expander and Flex HD with Dr. Marla Roe.    Smoking Status: non-smoker Last Mammogram: 02/16/19 (bilateral MRI - 03/23/19); Results: left breast cancer  Caprini Score: 7 High; Risk Factors include: 69 year-old female, BMI > 25, personal hx of cancer, and length of planned surgery. Recommendation for mechanical and pharmacological prophylaxis during surgery. Encourage early ambulation.   Post-op RX sent to pharmacy:Cipro, Zofran, Norco, and Valium  Spoke with Dr. Saralyn Pilar' office - She may hold Plavix 5 days prior to surgery.  Pictures:  04/16/19  Risks, benefits, and alternatives of procedure discussed, questions answered and consent obtained.  The risks that can be encountered with and after placement of a breast expander placement were discussed and include the following but not limited to these: bleeding, infection, delayed healing, anesthesia risks, skin sensation changes, injury to structures including nerves, blood vessels, and muscles which may be temporary or permanent, allergies to tape, suture materials and glues, blood products, topical preparations or injected agents, skin contour irregularities, skin discoloration and swelling, deep vein thrombosis, cardiac and pulmonary complications, pain, which may persist, fluid accumulation, wrinkling of the skin over the expander, changes in nipple or breast sensation, expander leakage or rupture, faulty position of the expander, persistent pain, formation of tight scar tissue around the expander (capsular contracture), possible need for revisional surgery or staged procedures.  The [place] was signed into law in 2016 which includes the topic of electronic health records.  This provides immediate access to information in MyChart.  This includes consultation notes, operative notes, office notes, lab results and pathology reports.  If you have any questions about what you read please let us know at your next visit or call us at the office.  We are right here with you.   Electronically signed by: Threasa Heads, PA-C 04/27/2019 3:56 PM

## 2019-04-26 NOTE — H&P (View-Only) (Signed)
ICD-10-CM   1. Malignant neoplasm of upper-outer quadrant of left breast in female, estrogen receptor positive [facility])  C50.412    Z17.0       Patient ID: Tammy Elliott, female    DOB: [birth year], 69 y.o.   MRN: [id number]   History of Present Illness: Tammy Elliott is a 69 y.o.  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  She presents for preoperative evaluation for upcoming procedure, left mastectomy with Dr. Windell Moment and left breast reconstruction with placement of tissue expander and Flex HD with Dr. Marla Roe, scheduled for 05/03/2019.  PMH significant for T2DM, stent in LAD (Dr. Saralyn Pilar) & taking Plavix, and HLD. Grade 3 ptosis of both breasts. Denies personal and family history of blood clots.  The patient has not had problems with anesthesia.   Past Medical History: Allergies: Allergies  Allergen Reactions  . Exenatide Nausea Only  . Orlistat     Other reaction(s): Unknown  . Penicillins Hives and Swelling  . Thiazide-Type Diuretics     Other reaction(s): Unknown    Current Medications:  Current Outpatient Medications:  .  CALCIUM-MAGNESIUM-ZINC PO, Take 1 tablet by mouth every evening. , Disp: , Rfl:  .  clopidogrel (PLAVIX) 75 MG tablet, Take 75 mg by mouth daily. , Disp: , Rfl:  .  glimepiride (AMARYL) 4 MG tablet, Take 4 mg by mouth 2 (two) times daily. , Disp: , Rfl:  .  JANUMET 50-500 MG tablet, Take 1 tablet by mouth 2 (two) times daily., Disp: , Rfl:  .  JARDIANCE 25 MG TABS tablet, Take 25 mg by mouth daily. , Disp: , Rfl:  .  lansoprazole (PREVACID) 15 MG capsule, Take 15 mg by mouth daily. , Disp: , Rfl:  .  lovastatin (MEVACOR) 40 MG tablet, Take 40 mg by mouth at bedtime. , Disp: , Rfl:  .  metFORMIN (GLUCOPHAGE) 500 MG tablet, Take 500 mg by mouth 2 (two) times daily. , Disp: , Rfl:  .  Multiple Vitamin  (MULTIVITAMIN) tablet, Take 1 tablet by mouth daily., Disp: , Rfl:   Past Medical Problems: Past Medical History:  Diagnosis Date  . Basal cell carcinoma    basal cell -skin cancer s/p removal  . CAD S/P percutaneous coronary angioplasty 2012   Dr. Saralyn Pilar ([place], [place]): DES PCI to LAD  . DM (diabetes mellitus), type 2 with complications (HCC)    CAD  . GERD (gastroesophageal reflux disease)   . Hypercholesterolemia   . Hyperlipidemia associated with type 2 diabetes mellitus ([place])    On lovastatin    Past Surgical History: Past Surgical History:  Procedure Laterality Date  . BREAST BIOPSY Left 03/11/2019   stereo biopsy/ x clip/path pending  . CARDIAC CATHETERIZATION    . CHOLECYSTECTOMY    . COLONOSCOPY    . CORONARY STENT INTERVENTION  2012   Dr. Saralyn Pilar ([place]): DES PCI pLAD  . NM GATED MYOVIEW (ARMX HX)  05/09/2017   [facility]) exercised 8:36 min with no chest pain or dyspnea.  No EKG changes.  Normal EF 66%.  No ischemia or infarction)    Social History: Social History   Socioeconomic History  . Marital status: Married    Spouse name: Not on file  . Number of children: Not on file  . Years of education: Not on file  .  Highest education level: Not on file  Occupational History  . Occupation: Printmaker: [employer]  Tobacco Use  . Smoking status: Never Smoker  . Smokeless tobacco: Never Used  Substance and Sexual Activity  . Alcohol use: Not Currently  . Drug use: Never  . Sexual activity: Not on file  Other Topics Concern  . Not on file  Social History Narrative   reports that she has never smoked. She has never used smokeless tobacco. She reports that she rarely drinks alcohol. She reports that she does not use drugs.      Married to [name] ([name]) [name]    Social Determinants of Health   Financial Resource Strain:   . Difficulty of Paying Living Expenses: Not on file  Food  Insecurity:   . Worried About Charity fundraiser in the Last Year: Not on file  . Ran Out of Food in the Last Year: Not on file  Transportation Needs:   . Lack of Transportation (Medical): Not on file  . Lack of Transportation (Non-Medical): Not on file  Physical Activity:   . Days of Exercise per Week: Not on file  . Minutes of Exercise per Session: Not on file  Stress:   . Feeling of Stress : Not on file  Social Connections:   . Frequency of Communication with Friends and Family: Not on file  . Frequency of Social Gatherings with Friends and Family: Not on file  . Attends Religious Services: Not on file  . Active Member of Clubs or Organizations: Not on file  . Attends Archivist Meetings: Not on file  . Marital Status: Not on file  Intimate Partner Violence:   . Fear of Current or Ex-Partner: Not on file  . Emotionally Abused: Not on file  . Physically Abused: Not on file  . Sexually Abused: Not on file    Family History: Family History  Problem Relation Age of Onset  . CAD Mother   . Diabetes Mellitus II Mother   . Hypertension Mother   . Uterine cancer Mother   . CAD Father   . Heart attack Father   . Hypertension Father   . Diabetes Mellitus II Father        Took insulin  . Alcohol abuse Father   . Diabetes Mellitus II Sister   . Diabetes Other        Almost all sisters are diabetic or pre-diabetic   . Breast cancer Neg Hx     Review of Systems: Review of Systems  Constitutional: Negative for chills and fever.  HENT: Negative for congestion and sore throat.   Respiratory: Negative for cough and shortness of breath.   Cardiovascular: Negative for chest pain and leg swelling.  Gastrointestinal: Negative for abdominal pain, nausea and vomiting.  Musculoskeletal: Negative for back pain, joint pain, myalgias and neck pain.  Skin: Negative for itching and rash.    Physical Exam: Vital Signs BP 136/77 (BP Location: Left Arm, Patient Position:  Sitting, Cuff Size: Normal)   Pulse 87   Temp (!) 97.3 F (36.3 C) (Temporal)   Ht _0  (1.727 m)   Wt 177 lb (80.3 kg)   SpO2 100%   BMI 26.91 kg/m  Physical Exam Constitutional:      Appearance: Normal appearance. She is normal weight.  HENT:     Head: Normocephalic and atraumatic.  Eyes:     Extraocular Movements: Extraocular movements intact.  Cardiovascular:  Rate and Rhythm: Normal rate and regular rhythm.     Pulses: Normal pulses.     Heart sounds: Normal heart sounds.  Pulmonary:     Effort: Pulmonary effort is normal.     Breath sounds: Normal breath sounds. No wheezing, rhonchi or rales.  Abdominal:     General: Bowel sounds are normal.     Palpations: Abdomen is soft.  Musculoskeletal:        General: No swelling. Normal range of motion.     Cervical back: Normal range of motion.  Skin:    General: Skin is warm and dry.     Coloration: Skin is not pale.     Findings: No erythema or rash.  Neurological:     General: No focal deficit present.     Mental Status: She is alert and oriented to person, place, and time.  Psychiatric:        Mood and Affect: Mood normal.        Behavior: Behavior normal.        Thought Content: Thought content normal.        Judgment: Judgment normal.     Assessment/Plan:  Ms. Yorks scheduled for left mastectomy with Dr. Windell Moment and immediate reconstruction with placement of tissue expander and Flex HD with Dr. Marla Roe.    Smoking Status: non-smoker Last Mammogram: 02/16/19 (bilateral MRI - 03/23/19); Results: left breast cancer  Caprini Score: 7 High; Risk Factors include: 69 year-old female, BMI > 25, personal hx of cancer, and length of planned surgery. Recommendation for mechanical and pharmacological prophylaxis during surgery. Encourage early ambulation.   Post-op RX sent to pharmacy:Cipro, Zofran, Norco, and Valium  Spoke with Dr. Saralyn Pilar' office - She may hold Plavix 5 days prior to surgery.  Pictures:  04/16/19  Risks, benefits, and alternatives of procedure discussed, questions answered and consent obtained.  The risks that can be encountered with and after placement of a breast expander placement were discussed and include the following but not limited to these: bleeding, infection, delayed healing, anesthesia risks, skin sensation changes, injury to structures including nerves, blood vessels, and muscles which may be temporary or permanent, allergies to tape, suture materials and glues, blood products, topical preparations or injected agents, skin contour irregularities, skin discoloration and swelling, deep vein thrombosis, cardiac and pulmonary complications, pain, which may persist, fluid accumulation, wrinkling of the skin over the expander, changes in nipple or breast sensation, expander leakage or rupture, faulty position of the expander, persistent pain, formation of tight scar tissue around the expander (capsular contracture), possible need for revisional surgery or staged procedures.  The [place] was signed into law in 2016 which includes the topic of electronic health records.  This provides immediate access to information in MyChart.  This includes consultation notes, operative notes, office notes, lab results and pathology reports.  If you have any questions about what you read please let us know at your next visit or call us at the office.  We are right here with you.   Electronically signed by: Threasa Heads, PA-C 04/27/2019 3:56 PM

## 2019-04-27 ENCOUNTER — Ambulatory Visit (INDEPENDENT_AMBULATORY_CARE_PROVIDER_SITE_OTHER): Payer: 59 | Admitting: Plastic Surgery

## 2019-04-27 ENCOUNTER — Encounter: Payer: Self-pay | Admitting: Plastic Surgery

## 2019-04-27 ENCOUNTER — Other Ambulatory Visit: Payer: Self-pay

## 2019-04-27 VITALS — BP 136/77 | HR 87 | Temp 97.3°F | Ht 68.0 in | Wt 177.0 lb

## 2019-04-27 DIAGNOSIS — C50412 Malignant neoplasm of upper-outer quadrant of left female breast: Secondary | ICD-10-CM

## 2019-04-27 DIAGNOSIS — Z170 Estrogen receptor positive status [ER+]: Secondary | ICD-10-CM

## 2019-04-27 MED ORDER — ONDANSETRON HCL 4 MG PO TABS: 4.0000 mg | ORAL_TABLET | Freq: Three times a day (TID) | ORAL | 0 refills | Status: DC | PRN

## 2019-04-27 MED ORDER — DOXYCYCLINE HYCLATE 100 MG PO TABS: 100.0000 mg | ORAL_TABLET | Freq: Two times a day (BID) | ORAL | 0 refills | Status: AC

## 2019-04-27 MED ORDER — DIAZEPAM 2 MG PO TABS: 2.0000 mg | ORAL_TABLET | Freq: Two times a day (BID) | ORAL | 0 refills | Status: DC | PRN

## 2019-04-27 MED ORDER — HYDROCODONE-ACETAMINOPHEN 5-325 MG PO TABS: 1.0000 | ORAL_TABLET | Freq: Three times a day (TID) | ORAL | 0 refills | Status: AC | PRN

## 2019-04-28 ENCOUNTER — Other Ambulatory Visit
Admission: RE | Admit: 2019-04-28 | Discharge: 2019-04-28 | Disposition: A | Discharge status: Home / Self Care | Payer: 59 | Source: Ambulatory Visit | Attending: General Surgery | Admitting: General Surgery

## 2019-04-28 ENCOUNTER — Telehealth: Payer: Self-pay

## 2019-04-28 DIAGNOSIS — Z01812 Encounter for preprocedural laboratory examination: Secondary | ICD-10-CM | POA: Diagnosis not present

## 2019-04-28 DIAGNOSIS — Z20822 Contact with and (suspected) exposure to covid-19: Secondary | ICD-10-CM | POA: Insufficient documentation

## 2019-04-28 NOTE — Telephone Encounter (Signed)
Call to pt- no answer- left v/m instructing her to hold her Plavix for 5 days prior to surgery Requested she call back to office to verify she received these instructions

## 2019-04-29 ENCOUNTER — Other Ambulatory Visit: Admission: RE | Admit: 2019-04-29 | Payer: 59 | Source: Ambulatory Visit

## 2019-04-29 LAB — SARS CORONAVIRUS 2 (TAT 6-24 HRS): SARS Coronavirus 2: NEGATIVE

## 2019-04-30 NOTE — Telephone Encounter (Signed)
Spoke with patient.  She did receive our messages and she stopped taking her Plavix 5 days prior to surgery.

## 2019-05-02 MED ORDER — CIPROFLOXACIN IN D5W 400 MG/200ML IV SOLN
400.0000 mg | INTRAVENOUS | Status: AC
  Administered 2019-05-03: 400 mg via INTRAVENOUS

## 2019-05-02 MED ORDER — CLINDAMYCIN PHOSPHATE 900 MG/50ML IV SOLN
900.0000 mg | INTRAVENOUS | Status: AC
  Administered 2019-05-03: 10:00:00 900 mg via INTRAVENOUS

## 2019-05-03 ENCOUNTER — Other Ambulatory Visit: Payer: Self-pay

## 2019-05-03 ENCOUNTER — Encounter: Payer: Self-pay | Admitting: General Surgery

## 2019-05-03 ENCOUNTER — Ambulatory Visit: Payer: 59 | Admitting: Anesthesiology

## 2019-05-03 ENCOUNTER — Encounter (HOSPITAL_BASED_OUTPATIENT_CLINIC_OR_DEPARTMENT_OTHER)
Admission: RE | Admit: 2019-05-03 | Discharge: 2019-05-03 | Disposition: A | Discharge status: Home / Self Care | Payer: 59 | Source: Ambulatory Visit | Attending: General Surgery | Admitting: General Surgery

## 2019-05-03 ENCOUNTER — Observation Stay
Admission: RE | Admit: 2019-05-03 | Discharge: 2019-05-04 | Disposition: A | Discharge status: Home / Self Care | Payer: 59 | Attending: General Surgery | Admitting: General Surgery

## 2019-05-03 ENCOUNTER — Encounter
Admission: RE | Disposition: A | Discharge status: Home / Self Care | Payer: Self-pay | Source: Home / Self Care | Attending: General Surgery

## 2019-05-03 DIAGNOSIS — C50412 Malignant neoplasm of upper-outer quadrant of left female breast: Secondary | ICD-10-CM

## 2019-05-03 DIAGNOSIS — C44521 Squamous cell carcinoma of skin of breast: Secondary | ICD-10-CM | POA: Diagnosis present

## 2019-05-03 DIAGNOSIS — Z888 Allergy status to other drugs, medicaments and biological substances status: Secondary | ICD-10-CM | POA: Insufficient documentation

## 2019-05-03 DIAGNOSIS — Z7902 Long term (current) use of antithrombotics/antiplatelets: Secondary | ICD-10-CM | POA: Insufficient documentation

## 2019-05-03 DIAGNOSIS — Z79899 Other long term (current) drug therapy: Secondary | ICD-10-CM | POA: Diagnosis not present

## 2019-05-03 DIAGNOSIS — Z955 Presence of coronary angioplasty implant and graft: Secondary | ICD-10-CM | POA: Insufficient documentation

## 2019-05-03 DIAGNOSIS — C50012 Malignant neoplasm of nipple and areola, left female breast: Secondary | ICD-10-CM

## 2019-05-03 DIAGNOSIS — E785 Hyperlipidemia, unspecified: Secondary | ICD-10-CM | POA: Diagnosis not present

## 2019-05-03 DIAGNOSIS — K219 Gastro-esophageal reflux disease without esophagitis: Secondary | ICD-10-CM | POA: Insufficient documentation

## 2019-05-03 DIAGNOSIS — Z880 Allergy status to penicillin: Secondary | ICD-10-CM | POA: Insufficient documentation

## 2019-05-03 DIAGNOSIS — Z7984 Long term (current) use of oral hypoglycemic drugs: Secondary | ICD-10-CM | POA: Insufficient documentation

## 2019-05-03 DIAGNOSIS — Z170 Estrogen receptor positive status [ER+]: Secondary | ICD-10-CM

## 2019-05-03 DIAGNOSIS — E119 Type 2 diabetes mellitus without complications: Secondary | ICD-10-CM | POA: Diagnosis not present

## 2019-05-03 DIAGNOSIS — C50919 Malignant neoplasm of unspecified site of unspecified female breast: Secondary | ICD-10-CM | POA: Diagnosis present

## 2019-05-03 DIAGNOSIS — E7800 Pure hypercholesterolemia, unspecified: Secondary | ICD-10-CM | POA: Insufficient documentation

## 2019-05-03 DIAGNOSIS — I2510 Atherosclerotic heart disease of native coronary artery without angina pectoris: Secondary | ICD-10-CM | POA: Insufficient documentation

## 2019-05-03 DIAGNOSIS — C50912 Malignant neoplasm of unspecified site of left female breast: Principal | ICD-10-CM | POA: Insufficient documentation

## 2019-05-03 HISTORY — PX: TOTAL MASTECTOMY: SHX6129

## 2019-05-03 HISTORY — PX: BREAST RECONSTRUCTION WITH PLACEMENT OF TISSUE EXPANDER AND FLEX HD (ACELLULAR HYDRATED DERMIS): SHX6295

## 2019-05-03 LAB — CREATININE, SERUM
Creatinine, Ser: 0.94 mg/dL (ref 0.44–1.00)
GFR calc Af Amer: >60 mL/min (ref >60)
GFR calc non Af Amer: >60 mL/min (ref >60)

## 2019-05-03 LAB — GLUCOSE, CAPILLARY
Glucose-Capillary: 207 mg/dL — ABNORMAL HIGH (ref 70–99)
Glucose-Capillary: 258 mg/dL — ABNORMAL HIGH (ref 70–99)
Glucose-Capillary: 227 mg/dL — ABNORMAL HIGH (ref 70–99)
Glucose-Capillary: 285 mg/dL — ABNORMAL HIGH (ref 70–99)
Glucose-Capillary: 271 mg/dL — ABNORMAL HIGH (ref 70–99)

## 2019-05-03 LAB — HIV ANTIBODY (ROUTINE TESTING W REFLEX): HIV Screen 4th Generation wRfx: NONREACTIVE

## 2019-05-03 SURGERY — MASTECTOMY, SIMPLE
Anesthesia: General | Site: Breast | Laterality: Left

## 2019-05-03 MED ORDER — BUPIVACAINE LIPOSOME 1.3 % IJ SUSP
INTRAMUSCULAR | Status: DC | PRN
  Administered 2019-05-03: 20 mL

## 2019-05-03 MED ORDER — ROCURONIUM BROMIDE 50 MG/5ML IV SOLN
INTRAVENOUS | Status: AC
  Filled 2019-05-03: qty 1

## 2019-05-03 MED ORDER — LACTATED RINGERS IV SOLN: INTRAVENOUS | Status: DC

## 2019-05-03 MED ORDER — CHLORHEXIDINE GLUCONATE CLOTH 2 % EX PADS: 6.0000 | MEDICATED_PAD | Freq: Once | CUTANEOUS | Status: DC

## 2019-05-03 MED ORDER — ACETAMINOPHEN 325 MG PO TABS
650.0000 mg | ORAL_TABLET | Freq: Four times a day (QID) | ORAL | Status: DC | PRN
  Administered 2019-05-03 – 2019-05-04 (×4): 650 mg via ORAL
  Filled 2019-05-03 (×3): qty 2

## 2019-05-03 MED ORDER — FENTANYL CITRATE (PF) 100 MCG/2ML IJ SOLN
25.0000 ug | INTRAMUSCULAR | Status: DC | PRN
  Administered 2019-05-03: 14:00:00 25 ug via INTRAVENOUS

## 2019-05-03 MED ORDER — ENOXAPARIN SODIUM 40 MG/0.4ML ~~LOC~~ SOLN
40.0000 mg | SUBCUTANEOUS | Status: DC
  Administered 2019-05-04: 09:00:00 40 mg via SUBCUTANEOUS
  Filled 2019-05-03: qty 0.4

## 2019-05-03 MED ORDER — ONDANSETRON HCL 4 MG/2ML IJ SOLN
INTRAMUSCULAR | Status: AC
  Filled 2019-05-03: qty 2

## 2019-05-03 MED ORDER — PROPOFOL 10 MG/ML IV BOLUS
INTRAVENOUS | Status: DC | PRN
  Administered 2019-05-03: 100 mg via INTRAVENOUS

## 2019-05-03 MED ORDER — CLINDAMYCIN PHOSPHATE 900 MG/50ML IV SOLN
INTRAVENOUS | Status: AC
  Filled 2019-05-03: qty 50

## 2019-05-03 MED ORDER — DIAZEPAM 2 MG PO TABS: 2.0000 mg | ORAL_TABLET | Freq: Two times a day (BID) | ORAL | Status: DC | PRN

## 2019-05-03 MED ORDER — INSULIN ASPART 100 UNIT/ML ~~LOC~~ SOLN
SUBCUTANEOUS | Status: AC
  Administered 2019-05-04: 2 [IU] via SUBCUTANEOUS
  Filled 2019-05-03: qty 1

## 2019-05-03 MED ORDER — SODIUM CHLORIDE 0.9 % IV SOLN
INTRAVENOUS | Status: DC | PRN
  Administered 2019-05-03: 200 mL

## 2019-05-03 MED ORDER — SODIUM CHLORIDE 0.9 % IV SOLN: INTRAVENOUS | Status: DC

## 2019-05-03 MED ORDER — TECHNETIUM TC 99M SULFUR COLLOID FILTERED
1.0600 | Freq: Once | INTRAVENOUS | Status: AC | PRN
  Administered 2019-05-03: 08:00:00 1.06 via INTRADERMAL

## 2019-05-03 MED ORDER — BUPIVACAINE LIPOSOME 1.3 % IJ SUSP
INTRAMUSCULAR | Status: AC
  Filled 2019-05-03: qty 20

## 2019-05-03 MED ORDER — ESMOLOL HCL 100 MG/10ML IV SOLN
INTRAVENOUS | Status: AC
  Filled 2019-05-03: qty 10

## 2019-05-03 MED ORDER — ACETAMINOPHEN 650 MG RE SUPP: 650.0000 mg | Freq: Four times a day (QID) | RECTAL | Status: DC | PRN

## 2019-05-03 MED ORDER — MIDAZOLAM HCL 2 MG/2ML IJ SOLN
INTRAMUSCULAR | Status: AC
  Filled 2019-05-03: qty 2

## 2019-05-03 MED ORDER — FENTANYL CITRATE (PF) 100 MCG/2ML IJ SOLN
INTRAMUSCULAR | Status: AC
  Administered 2019-05-03: 14:00:00 25 ug via INTRAVENOUS
  Filled 2019-05-03: qty 2

## 2019-05-03 MED ORDER — PHENYLEPHRINE HCL (PRESSORS) 10 MG/ML IV SOLN
INTRAVENOUS | Status: AC
  Filled 2019-05-03: qty 1

## 2019-05-03 MED ORDER — MORPHINE SULFATE (PF) 4 MG/ML IV SOLN: 4.0000 mg | INTRAVENOUS | Status: DC | PRN

## 2019-05-03 MED ORDER — LIDOCAINE HCL (CARDIAC) PF 100 MG/5ML IV SOSY
PREFILLED_SYRINGE | INTRAVENOUS | Status: DC | PRN
  Administered 2019-05-03: 60 mg via INTRAVENOUS

## 2019-05-03 MED ORDER — BUPIVACAINE-EPINEPHRINE 0.25% -1:200000 IJ SOLN
INTRAMUSCULAR | Status: DC | PRN
  Administered 2019-05-03: 30 mL

## 2019-05-03 MED ORDER — PRAVASTATIN SODIUM 20 MG PO TABS
20.0000 mg | ORAL_TABLET | Freq: Every day | ORAL | Status: DC
  Administered 2019-05-03: 18:00:00 20 mg via ORAL
  Filled 2019-05-03: qty 1

## 2019-05-03 MED ORDER — SUCCINYLCHOLINE CHLORIDE 20 MG/ML IJ SOLN
INTRAMUSCULAR | Status: DC | PRN
  Administered 2019-05-03: 100 mg via INTRAVENOUS

## 2019-05-03 MED ORDER — INSULIN ASPART 100 UNIT/ML ~~LOC~~ SOLN
SUBCUTANEOUS | Status: DC | PRN
  Administered 2019-05-03: 3 [IU] via SUBCUTANEOUS

## 2019-05-03 MED ORDER — DEXAMETHASONE SODIUM PHOSPHATE 10 MG/ML IJ SOLN
INTRAMUSCULAR | Status: DC | PRN
  Administered 2019-05-03: 5 mg via INTRAVENOUS

## 2019-05-03 MED ORDER — CIPROFLOXACIN IN D5W 400 MG/200ML IV SOLN
INTRAVENOUS | Status: AC
  Filled 2019-05-03: qty 200

## 2019-05-03 MED ORDER — HYDROCODONE-ACETAMINOPHEN 5-325 MG PO TABS
1.0000 | ORAL_TABLET | ORAL | Status: DC | PRN
  Filled 2019-05-03: qty 1

## 2019-05-03 MED ORDER — BACITRACIN 50000 UNITS IM SOLR
INTRAMUSCULAR | Status: AC
  Filled 2019-05-03: qty 1

## 2019-05-03 MED ORDER — DEXAMETHASONE SODIUM PHOSPHATE 10 MG/ML IJ SOLN
INTRAMUSCULAR | Status: AC
  Filled 2019-05-03: qty 1

## 2019-05-03 MED ORDER — PROPOFOL 10 MG/ML IV BOLUS
INTRAVENOUS | Status: AC
  Filled 2019-05-03: qty 20

## 2019-05-03 MED ORDER — MIDAZOLAM HCL 2 MG/2ML IJ SOLN
INTRAMUSCULAR | Status: DC | PRN
  Administered 2019-05-03: 2 mg via INTRAVENOUS

## 2019-05-03 MED ORDER — SUCCINYLCHOLINE CHLORIDE 20 MG/ML IJ SOLN
INTRAMUSCULAR | Status: AC
  Filled 2019-05-03: qty 1

## 2019-05-03 MED ORDER — SODIUM CHLORIDE (PF) 0.9 % IJ SOLN
INTRAMUSCULAR | Status: AC
  Filled 2019-05-03: qty 50

## 2019-05-03 MED ORDER — LIDOCAINE HCL (PF) 2 % IJ SOLN
INTRAMUSCULAR | Status: AC
  Filled 2019-05-03: qty 10

## 2019-05-03 MED ORDER — INSULIN ASPART 100 UNIT/ML ~~LOC~~ SOLN
0.0000 [IU] | Freq: Three times a day (TID) | SUBCUTANEOUS | Status: DC
  Administered 2019-05-03: 18:00:00 8 [IU] via SUBCUTANEOUS
  Filled 2019-05-03 (×2): qty 1

## 2019-05-03 MED ORDER — ONDANSETRON HCL 4 MG/2ML IJ SOLN: 4.0000 mg | Freq: Once | INTRAMUSCULAR | Status: DC | PRN

## 2019-05-03 MED ORDER — SODIUM CHLORIDE FLUSH 0.9 % IV SOLN
INTRAVENOUS | Status: AC
  Filled 2019-05-03: qty 10

## 2019-05-03 MED ORDER — FENTANYL CITRATE (PF) 250 MCG/5ML IJ SOLN
INTRAMUSCULAR | Status: AC
  Filled 2019-05-03: qty 5

## 2019-05-03 MED ORDER — ONDANSETRON HCL 4 MG/2ML IJ SOLN
INTRAMUSCULAR | Status: DC | PRN
  Administered 2019-05-03: 4 mg via INTRAVENOUS

## 2019-05-03 MED ORDER — EPHEDRINE SULFATE 50 MG/ML IJ SOLN
INTRAMUSCULAR | Status: AC
  Filled 2019-05-03: qty 1

## 2019-05-03 MED ORDER — PHENYLEPHRINE HCL (PRESSORS) 10 MG/ML IV SOLN
INTRAVENOUS | Status: DC | PRN
  Administered 2019-05-03: 100 ug via INTRAVENOUS

## 2019-05-03 MED ORDER — ROCURONIUM BROMIDE 100 MG/10ML IV SOLN
INTRAVENOUS | Status: DC | PRN
  Administered 2019-05-03: 10 mg via INTRAVENOUS

## 2019-05-03 MED ORDER — EPHEDRINE SULFATE 50 MG/ML IJ SOLN
INTRAMUSCULAR | Status: DC | PRN
  Administered 2019-05-03 (×4): 5 mg via INTRAVENOUS

## 2019-05-03 MED ORDER — FENTANYL CITRATE (PF) 100 MCG/2ML IJ SOLN
INTRAMUSCULAR | Status: DC | PRN
  Administered 2019-05-03 (×3): 50 ug via INTRAVENOUS

## 2019-05-03 MED ORDER — PANTOPRAZOLE SODIUM 40 MG PO TBEC
40.0000 mg | DELAYED_RELEASE_TABLET | Freq: Every day | ORAL | Status: DC
  Administered 2019-05-04: 12:00:00 40 mg via ORAL
  Filled 2019-05-03: qty 1

## 2019-05-03 SURGICAL SUPPLY — 86 items
BAG DECANTER FOR FLEXI CONT (MISCELLANEOUS) IMPLANT
BINDER BREAST LRG (GAUZE/BANDAGES/DRESSINGS) IMPLANT
BINDER BREAST MEDIUM (GAUZE/BANDAGES/DRESSINGS) IMPLANT
BINDER BREAST XLRG (GAUZE/BANDAGES/DRESSINGS) ×4 IMPLANT
BINDER BREAST XXLRG (GAUZE/BANDAGES/DRESSINGS) IMPLANT
BIOPATCH WHT 1IN DISK W/4.0 H (GAUZE/BANDAGES/DRESSINGS) ×4 IMPLANT
BLADE BOVIE TIP EXT 4 (BLADE) ×4 IMPLANT
BLADE SURG 15 STRL LF DISP TIS (BLADE) ×2 IMPLANT
BLADE SURG 15 STRL SS (BLADE) ×2
BNDG GAUZE 4.5X4.1 6PLY STRL (MISCELLANEOUS) IMPLANT
BULB RESERV EVAC DRAIN JP 100C (MISCELLANEOUS) ×4 IMPLANT
CANISTER SUCT 1200ML W/VALVE (MISCELLANEOUS) ×8 IMPLANT
CHLORAPREP W/TINT 26 (MISCELLANEOUS) ×4 IMPLANT
CNTNR SPEC 2.5X3XGRAD LEK (MISCELLANEOUS) ×2
CONT SPEC 4OZ STER OR WHT (MISCELLANEOUS) ×2
CONTAINER SPEC 2.5X3XGRAD LEK (MISCELLANEOUS) ×2 IMPLANT
COVER WAND RF STERILE (DRAPES) ×4 IMPLANT
DECANTER SPIKE VIAL GLASS SM (MISCELLANEOUS) IMPLANT
DERMABOND ADVANCED (GAUZE/BANDAGES/DRESSINGS) ×2
DERMABOND ADVANCED .7 DNX12 (GAUZE/BANDAGES/DRESSINGS) ×2 IMPLANT
DRAIN CHANNEL 19F RND (DRAIN) IMPLANT
DRAIN CHANNEL JP 15F RND 16 (MISCELLANEOUS) IMPLANT
DRAIN CHANNEL JP 19F (MISCELLANEOUS) ×4 IMPLANT
DRAPE LAPAROTOMY 77X122 PED (DRAPES) ×4 IMPLANT
DRAPE LAPAROTOMY TRNSV 106X77 (MISCELLANEOUS) IMPLANT
DRSG GAUZE FLUFF 36X18 (GAUZE/BANDAGES/DRESSINGS) ×4 IMPLANT
ELECT BLADE 6.5 EXT (BLADE) ×4 IMPLANT
ELECT CAUTERY BLADE 6.4 (BLADE) ×4 IMPLANT
ELECT CAUTERY BLADE TIP 2.5 (TIP) ×4
ELECT REM PT RETURN 9FT ADLT (ELECTROSURGICAL) ×4
ELECTRODE CAUTERY BLDE TIP 2.5 (TIP) ×2 IMPLANT
ELECTRODE REM PT RTRN 9FT ADLT (ELECTROSURGICAL) ×2 IMPLANT
GAUZE SPONGE 4X4 12PLY STRL (GAUZE/BANDAGES/DRESSINGS) ×4 IMPLANT
GLOVE BIO SURGEON STRL SZ 6.5 (GLOVE) ×15 IMPLANT
GLOVE BIO SURGEONS STRL SZ 6.5 (GLOVE) ×5
GLOVE BIOGEL PI IND STRL 6.5 (GLOVE) ×2 IMPLANT
GLOVE BIOGEL PI INDICATOR 6.5 (GLOVE) ×2
GOWN STRL REUS W/ TWL LRG LVL3 (GOWN DISPOSABLE) ×10 IMPLANT
GOWN STRL REUS W/TWL LRG LVL3 (GOWN DISPOSABLE) ×10
GRAFT FLEX HD 6X16 PLIABLE (Tissue) ×4 IMPLANT
IMPL EXPANDER BREAST 535CC (Breast) ×2 IMPLANT
IMPLANT BREAST 535CC (Breast) ×2 IMPLANT
IMPLANT EXPANDER BREAST 535CC (Breast) ×2 IMPLANT
IV NS 1000ML (IV SOLUTION)
IV NS 1000ML BAXH (IV SOLUTION) IMPLANT
IV NS 500ML (IV SOLUTION) ×2
IV NS 500ML BAXH (IV SOLUTION) ×2 IMPLANT
KIT TURNOVER KIT A (KITS) ×4 IMPLANT
LABEL OR SOLS (LABEL) ×4 IMPLANT
MARGIN MAP 10MM (MISCELLANEOUS) IMPLANT
NEEDLE FILTER BLUNT 18X 1/2SAF (NEEDLE) ×4
NEEDLE FILTER BLUNT 18X1 1/2 (NEEDLE) ×4 IMPLANT
NEEDLE HYPO 22GX1.5 SAFETY (NEEDLE) IMPLANT
PACK BASIN MAJOR ARMC (MISCELLANEOUS) ×8 IMPLANT
PACK UNIVERSAL (MISCELLANEOUS) IMPLANT
PAD ABD DERMACEA PRESS 5X9 (GAUZE/BANDAGES/DRESSINGS) ×8 IMPLANT
PIN SAFETY STRL (MISCELLANEOUS) ×4 IMPLANT
RETRACTOR WOUND ALXS 18CM SML (MISCELLANEOUS) IMPLANT
RTRCTR WOUND ALEXIS O 18CM SML (MISCELLANEOUS)
SET ASEPTIC TRANSFER (MISCELLANEOUS) ×8 IMPLANT
SLEVE PROBE SENORX GAMMA FIND (MISCELLANEOUS) ×4 IMPLANT
SPONGE LAP 18X18 RF (DISPOSABLE) ×4 IMPLANT
SUT ETHILON 3-0 FS-10 30 BLK (SUTURE)
SUT ETHILON 4-0 (SUTURE)
SUT ETHILON 4-0 FS2 18XMFL BLK (SUTURE)
SUT MNCRL 3-0 UNDYED SH (SUTURE) ×4 IMPLANT
SUT MNCRL 4-0 (SUTURE) ×4
SUT MNCRL 4-0 27XMFL (SUTURE) ×4
SUT MNCRL+ 5-0 UNDYED PC-3 (SUTURE) ×4 IMPLANT
SUT MONOCRYL 3-0 UNDYED (SUTURE) ×4
SUT MONOCRYL 5-0 (SUTURE) ×4
SUT PDS PLUS 2 (SUTURE) ×12
SUT PDS PLUS AB 2-0 CT-1 (SUTURE) ×12 IMPLANT
SUT SILK 2 0 SH (SUTURE) ×4 IMPLANT
SUT SILK 3-0 (SUTURE) IMPLANT
SUT SILK 4 0 SH (SUTURE) ×4 IMPLANT
SUT VIC AB 3-0 SH 27 (SUTURE) ×4
SUT VIC AB 3-0 SH 27X BRD (SUTURE) ×4 IMPLANT
SUT VIC AB 3-0 SH 8-18 (SUTURE) IMPLANT
SUT VICRYL+ 3-0 144IN (SUTURE) ×4 IMPLANT
SUTURE EHLN 3-0 FS-10 30 BLK (SUTURE) IMPLANT
SUTURE ETHLN 4-0 FS2 18XMF BLK (SUTURE) IMPLANT
SUTURE MNCRL 4-0 27XMF (SUTURE) ×4 IMPLANT
SYR 10ML LL (SYRINGE) IMPLANT
SYR BULB IRRIG 60ML STRL (SYRINGE) ×4 IMPLANT
TOWEL OR 17X26 4PK STRL BLUE (TOWEL DISPOSABLE) ×4 IMPLANT

## 2019-05-03 NOTE — Interval H&P Note (Signed)
History and Physical Interval Note:  05/03/2019 9:06 AM  Tammy Elliott  has presented today for surgery, with the diagnosis of C50.912 Malignant neoplasm of Lt female breast, unspecified estrogen status.  The various methods of treatment have been discussed with the patient and family. After consideration of risks, benefits and other options for treatment, the patient has consented to  Procedure(s): TOTAL MASTECTOMY W/ Sentinel Node (Left) LEFT BREAST RECONSTRUCTION WITH PLACEMENT OF TISSUE EXPANDER AND FLEX HD (ACELLULAR HYDRATED DERMIS) (Left) as a surgical intervention.  The patient's history has been reviewed, patient examined, no change in status, stable for surgery.  I have reviewed the patient's chart and labs.  Questions were answered to the patient's satisfaction.     Loel Lofty Dillingham

## 2019-05-03 NOTE — Anesthesia Procedure Notes (Signed)
Procedure Name: Intubation Date/Time: 05/03/2019 9:49 AM Performed by: Zetta Bills, CRNA Pre-anesthesia Checklist: Patient identified, Emergency Drugs available, Suction available and Patient being monitored Patient Re-evaluated:Patient Re-evaluated prior to induction Oxygen Delivery Method: Circle system utilized Preoxygenation: Pre-oxygenation with 100% oxygen Induction Type: IV induction Ventilation: Mask ventilation without difficulty Laryngoscope Size: Mac and 3 Grade View: Grade II Tube type: Oral Tube size: 7.0 mm Number of attempts: 1 Airway Equipment and Method: Stylet Placement Confirmation: ETT inserted through vocal cords under direct vision Secured at: 21 cm Tube secured with: Tape Dental Injury: Teeth and Oropharynx as per pre-operative assessment

## 2019-05-03 NOTE — Anesthesia Preprocedure Evaluation (Signed)
Anesthesia Evaluation  Patient identified by MRN, date of birth, ID band Patient awake    Reviewed: Allergy & Precautions, NPO status , Patient's Chart, lab work & pertinent test results  Airway Mallampati: III       Dental   Pulmonary neg pulmonary ROS,    Pulmonary exam normal        Cardiovascular + CAD  Normal cardiovascular exam     Neuro/Psych negative neurological ROS  negative psych ROS   GI/Hepatic GERD  ,  Endo/Other  diabetes  Renal/GU   negative genitourinary   Musculoskeletal negative musculoskeletal ROS (+)   Abdominal Normal abdominal exam  (+)   Peds negative pediatric ROS (+)  Hematology negative hematology ROS (+)   Anesthesia Other Findings Past Medical History: No date: Basal cell carcinoma     Comment:  basal cell -skin cancer s/p removal 2012: CAD S/P percutaneous coronary angioplasty     Comment:  Dr. Saralyn Pilar ([place], [place]): DES              PCI to LAD No date: DM (diabetes mellitus), type 2 with complications ([place])     Comment:  CAD No date: GERD (gastroesophageal reflux disease) No date: Hypercholesterolemia No date: Hyperlipidemia associated with type 2 diabetes mellitus (HCC)     Comment:  On lovastatin  Reproductive/Obstetrics                             Anesthesia Physical Anesthesia Plan  ASA: III  Anesthesia Plan: General   Post-op Pain Management:    Induction: Intravenous  PONV Risk Score and Plan:   Airway Management Planned: Oral ETT  Additional Equipment:   Intra-op Plan:   Post-operative Plan:   Informed Consent: I have reviewed the patients History and Physical, chart, labs and discussed the procedure including the risks, benefits and alternatives for the proposed anesthesia with the patient or authorized representative who has indicated his/her understanding and acceptance.     Dental advisory  given  Plan Discussed with: CRNA and Surgeon  Anesthesia Plan Comments:         Anesthesia Quick Evaluation

## 2019-05-03 NOTE — Discharge Instructions (Signed)
INSTRUCTIONS FOR AFTER BREAST SURGERY   You are getting ready to undergo breast surgery.  You will likely have some questions about what to expect following your operation.  The following information will help you and your family understand what to expect when you are discharged from the hospital.  Following these guidelines will help ensure a smooth recovery and reduce risks of complications.   Postoperative instructions include information on: diet, wound care, medications and physical activity.  AFTER SURGERY Expect to go home after the procedure.  In some cases, you may need to spend one night in the hospital for observation.  DIET Breast surgery does not require a specific diet.  However, the healthier you eat the better your body can start healing. It is important to increasing your protein intake.  This means limiting the foods with sugar and carbohydrates.  Focus on vegetables and some meat.  If you have any liposuction during your procedure be sure to drink water.  If your urine is bright yellow, then it is concentrated, and you need to drink more water.  As a general rule after surgery, you should have 8 ounces of water every hour while awake.  If you find you are persistently nauseated or unable to take in liquids let us know.  NO TOBACCO USE or EXPOSURE.  This will slow your healing process and increase the risk of a wound.  WOUND CARE If you don't have a drain:  You can shower the day after surgery. Use fragrance free soap.  Dial, [place] and [place] are usually mild on the skin. If you have a drain: You can shower five days after surgery.  Clean with baby wipes until the drain is removed.    If you have steri-strips / tape directly attached to your skin leave them in place. It is OK to get these wet.  No baths, pools or hot tubs for two weeks. We close your incision to leave the smallest and best-looking scar. No ointment or creams on your incisions until given the go ahead.  Especially not  Neosporin (Too many skin reactions with this one).  A few weeks after surgery you can use Mederma and start massaging the scar. We ask you to wear your binder or sports bra for the first 6 weeks around the clock, including while sleeping. This provides added comfort and helps reduce the fluid accumulation at the surgery site.  ACTIVITY No heavy lifting until cleared by the doctor.  This usually means no more than a half-gallon of milk.  It is OK to walk and climb stairs. In fact, moving your legs is very important to decrease your risk of a blood clot.  It will also help keep you from getting deconditioned.  Every 1 to 2 hours get up and walk for 5 minutes. This will help with a quicker recovery back to normal.  Let pain be your guide so you don't do too much.  This is not the time for spring cleaning and don't plan on taking care of anyone else.  This time is for you to recover,  You will be more comfortable if you sleep and rest with your head elevated either with a few pillows under you or in a recliner.  No stomach sleeping for a three months.  WORK Everyone returns to work at different times. As a rough guide, most people take at least 1 - 2 weeks off prior to returning to work. If you need documentation for your job,  bring the forms to your postoperative follow up visit.  DRIVING Arrange for someone to bring you home from the hospital.  You may be able to drive a few days after surgery but not while taking any narcotics or valium.  BOWEL MOVEMENTS Constipation can occur after anesthesia and while taking pain medication.  It is important to stay ahead for your comfort.  We recommend taking Milk of Magnesia (2 tablespoons; twice a day) while taking the pain pills.  SEROMA This is fluid your body tried to put in the surgical site.  This is normal but if it creates tight skinny skin let us know.  It usually decreases in a few weeks.  MEDICATIONS and PAIN CONTROL At your preoperative visit for  you history and physical you were given the following medications: 1. An antibiotic: Start this medication when you get home and take according to the instructions on the bottle. 2. Zofran 4 mg:  This is to treat nausea and vomiting.  You can take this every 6 hours as needed and only if needed. 3. Valium 2 mg: This is for muscle tightness if you have an implant or expander. This will help relax your muscle which also helps with pain control.  This can be taken every 12 hours as needed.  Don't drive after taking this medication. 4. Norco (hydrocodone/acetaminophen) 5/325 mg:  This is only to be used after you have taken the motrin or the tylenol. Every 8 hours as needed. Over the counter Medication to take: 5. Ibuprofen (Motrin) 600 mg:  Take this every 6 hours.  If you have additional pain then take 500 mg of the tylenol every 8 hours.  Only take the Norco after you have tried these two. 6. Miralax or stool softener of choice: Take this according to the bottle if you take the [place] Call your surgeon's office if any of the following occur: . Fever 101 degrees F or greater . Excessive bleeding or fluid from the incision site. . Pain that increases over time without aid from the medications . Redness, warmth, or pus draining from incision sites . Persistent nausea or inability to take in liquids . Severe misshapen area that underwent the operation.  Here are some resources:  1. Plastic surgery website: https://www.plasticsurgery.org/for-medical-professionals/education-and-resources/publications/breast-reconstruction-magazine 2. Breast Reconstruction Awareness Campaign:  HotelLives.co.nz 3. Plastic surgery Implant information:  https://www.plasticsurgery.org/patient-safety/breast-implant-safety

## 2019-05-03 NOTE — Transfer of Care (Signed)
Immediate Anesthesia Transfer of Care Note  Patient: Tammy Elliott  Procedure(s) Performed: TOTAL MASTECTOMY W/ Sentinel Node (Left Breast) LEFT BREAST RECONSTRUCTION WITH PLACEMENT OF TISSUE EXPANDER AND FLEX HD (ACELLULAR HYDRATED DERMIS) (Left )  Patient Location: PACU  Anesthesia Type:General  Level of Consciousness: awake  Airway & Oxygen Therapy: Patient Spontanous Breathing  Post-op Assessment: Report given to RN  Post vital signs: stable  Last Vitals:  Vitals Value Taken Time  BP 141/68 05/03/19 1321  Temp    Pulse 101 05/03/19 1325  Resp 23 05/03/19 1325  SpO2 92 % 05/03/19 1325  Vitals shown include unvalidated device data.  Last Pain:  Vitals:   05/03/19 0828  TempSrc: Temporal  PainSc: 0-No pain         Complications: No apparent anesthesia complications

## 2019-05-03 NOTE — Anesthesia Postprocedure Evaluation (Signed)
Anesthesia Post Note  Patient: Tammy Elliott  Procedure(s) Performed: TOTAL MASTECTOMY W/ Sentinel Node (Left Breast) LEFT BREAST RECONSTRUCTION WITH PLACEMENT OF TISSUE EXPANDER AND FLEX HD (ACELLULAR HYDRATED DERMIS) (Left )  Patient location during evaluation: PACU Anesthesia Type: General Level of consciousness: awake and alert Pain management: pain level controlled Vital Signs Assessment: post-procedure vital signs reviewed and stable Respiratory status: spontaneous breathing, nonlabored ventilation, respiratory function stable and patient connected to nasal cannula oxygen Cardiovascular status: blood pressure returned to baseline and stable Postop Assessment: no apparent nausea or vomiting Anesthetic complications: no     Last Vitals:  Vitals:   05/03/19 1406 05/03/19 1419  BP: (!) 146/67 128/66  Pulse: 89 85  Resp: 14 18  Temp:  36.4 C  SpO2: 98% 99%    Last Pain:  Vitals:   05/03/19 1419  TempSrc:   PainSc: 3                  Martha Clan

## 2019-05-03 NOTE — Op Note (Signed)
Op report    DATE OF OPERATION:  05/03/2019  LOCATION: [facility] Main Operating Room  SURGICAL DIVISION: Plastic Surgery  PREOPERATIVE DIAGNOSES:  1. Left Breast cancer.    POSTOPERATIVE DIAGNOSES:  1. Left Breast cancer.   PROCEDURE:  1. Left immediate breast reconstruction with placement of Acellular Dermal Matrix and tissue expanders.  SURGEON: Yves Fodor Sanger Jawara Latorre, DO  ASSISTANT: Phoebe Sharps, PA  ANESTHESIA:  General.   COMPLICATIONS: None.   IMPLANTS: Left - Mentor 535 cc. Ref [id number].  Serial Number [id number], 200 cc of injectable saline placed in the expander. Acellular Dermal Matrix 6 x 16 cm Flex HD  INDICATIONS FOR PROCEDURE:  The patient, Tammy Elliott, is a 69 y.o. female born on 1950/04/08, is here for  immediate first stage breast reconstruction with placement of a left tissue expander and Acellular dermal matrix. MRN: [id number]  CONSENT:  Informed consent was obtained directly from the patient. Risks, benefits and alternatives were fully discussed. Specific risks including but not limited to bleeding, infection, hematoma, seroma, scarring, pain, implant infection, implant extrusion, capsular contracture, asymmetry, wound healing problems, and need for further surgery were all discussed. The patient did have an ample opportunity to have her questions answered to her satisfaction.   DESCRIPTION OF PROCEDURE:  The patient was taken to the operating room by the general surgery team. SCDs were placed and IV antibiotics were given. The patient's chest was prepped and draped in a sterile fashion. A time out was performed and the implants to be used were identified.  A left mastectomy was performed.  Once the general surgery team had completed their portion of the case the patient was rendered to the plastic and reconstructive surgery team.  The pectoralis major muscle was lifted from the chest wall with release of the lateral edge and lateral  inframammary fold.  The pocket was irrigated with antibiotic solution and hemostasis was achieved with electrocautery.  The ADM was then prepared according to the manufacture guidelines and slits placed to help with postoperative fluid management.  The ADM was then sutured to the inferior and lateral edge of the inframammary fold with 2-0 PDS starting with an interrupted stitch and then a running stitch.  The lateral portion was sutured to with interrupted sutures after the expander was placed.  The expander was prepared according to the manufacture guidelines, the air evacuated and then it was placed under the ADM and pectoralis major muscle.  The inferior and lateral tabs were used to secure the expander to the chest wall with 2-0 PDS.  The drain was placed at the inframammary fold over the ADM and secured to the skin with 3-0 Silk.    The deep layers were closed with 3-0 Monocryl followed by 4-0 Monocryl.  The skin was closed with 5-0 Monocryl and then dermabond was applied.  The ABDs and breast binder were placed.  The patient tolerated the procedure well and there were no complications.  The patient was allowed to wake from anesthesia and taken to the recovery room in satisfactory condition.   The advanced practice practitioner (APP) assisted throughout the case.  The APP was essential in retraction and counter traction when needed to make the case progress smoothly.  This retraction and assistance made it possible to see the tissue plans for the procedure.  The assistance was needed for blood control, tissue re-approximation and assisted with closure of the incision site.  The [place] was signed into law in 2016 which  includes the topic of electronic health records.  This provides immediate access to information in MyChart.  This includes consultation notes, operative notes, office notes, lab results and pathology reports.  If you have any questions about what you read please let us know  at your next visit or call us at the office.  We are right here with you.

## 2019-05-03 NOTE — Op Note (Signed)
Preoperative diagnosis: Invasive lobular carcinoma of the left breast.  Postoperative diagnosis: Invasive lobular carcinoma of the left breast.   Procedure: Left Breast total mastectomy.                      Left axillary sentinel lymph node biopsy  Anesthesia: GETA  Surgeon: Dr. Windell Moment  Wound Classification: Clean  Indications: Patient is a 69 y.o. female who had an abnormal mammogram that on workup with core needle biopsy was found to be invasive lobular carcinoma.  MRI of the left breast shows multiple the first masses.  MRI guided biopsy shows additional masses are positive for malignancy.  After discussion of alternatives, total mastectomy was indicated.  Sentinel node biopsy indicated for staging.  The patient elected to proceed with immediate reconstruction.  Findings: 1.  Palpable mass of the left breast. 2.  No abnormal palpable lymph node in the axillary area.  Description of procedure: The patient was brought to the operating room and general anesthesia was induced. A time-out was completed verifying correct patient, procedure, site, positioning, and implant(s) and/or special equipment prior to beginning this procedure. The breast, chest wall, axilla, and upper arm and neck were prepped and draped in the usual sterile fashion.  A skin incision was made that encompassed the nipple-areola complex and the previous biopsy scar and passed in an oblique direction across the breast. Flaps were raised in the avascular plane between subcutaneous tissue and breast tissue from the clavicle superiorly, the sternum medially, the anterior rectus sheath inferiorly, and past the lateral border of the pectoralis major muscle laterally. Hemostasis was achieved in the flaps. Next, the breast tissue and underlying pectoralis fascia were excised from the pectoralis major muscle, progressing from medially to laterally. At the lateral border of the pectoralis major muscle, the breast tissue was swung  laterally and a lateral pedicle identified where breast tissue gave way to fat of axilla. The lateral pedicle was incised and the specimen removed.  A hand-held gamma probe was used to identify the location of the hottest spot in the axilla.  Dissection was carried down until subdermal facias was advanced. The probe was placed and again, the point of maximal count was found. Dissection continue until nodule was identified. The node was excised in its entirety. Ex vivo, the node measured 2158 counts when placed on the probe. The bed of the node measured 16 counts. No additional hot spots were identified. No clinically abnormal nodes were palpated.  Frozen evaluation of the sentinel node biopsy showed no gross malignancy preliminarily. The wound was irrigated and hemostasis was achieved.  The wound was left open and ready for second procedure by plastic and reconstructive surgery service.  Specimen: Left Breast                     Axillary sentinel lymph node   Complications: None  Estimated Blood Loss: 50 mL

## 2019-05-04 ENCOUNTER — Telehealth: Payer: Self-pay | Admitting: Plastic Surgery

## 2019-05-04 DIAGNOSIS — C50912 Malignant neoplasm of unspecified site of left female breast: Secondary | ICD-10-CM | POA: Diagnosis not present

## 2019-05-04 LAB — HEMOGLOBIN A1C
Hgb A1c MFr Bld: 7.2 % — ABNORMAL HIGH (ref 4.8–5.6)
Mean Plasma Glucose: 159.94 mg/dL

## 2019-05-04 LAB — GLUCOSE, CAPILLARY: Glucose-Capillary: 150 mg/dL — ABNORMAL HIGH (ref 70–99)

## 2019-05-04 NOTE — Telephone Encounter (Signed)
Tammy Elliott with [employer] called to inquire whether pt had surgery yesterday to be able to proceed with claim. Advd her surgery did happen yesterday.

## 2019-05-04 NOTE — Discharge Summary (Signed)
  Patient ID: SHAKIYAH LEUER MRN: [id number] DOB/AGE: 05/29/1950 69 y.o.  Admit date: 05/03/2019 Discharge date: 05/04/2019   Discharge Diagnoses:  Active Problems:   Breast cancer [facility])   Procedures: Left total mastectomy with immediate reconstruction  Hospital Course: Patient admitted for left total mastectomy with immediate reconstruction.  She tolerated the procedure well.  This morning the patient with pain control with acetaminophen.  She is tolerating diet.  The drain is adequate and starting to change to serosanguineous.  Patient able to ambulate.  No nausea or vomiting.  No fever or chills.  Adequate vital signs.  The wound is healthy with adequate flaps.  No hematoma or fluid collections.  No ischemic tissue.  Physical Exam  Constitutional: She is oriented to person, place, and time and well-developed, well-nourished, and in no distress.  Cardiovascular: Normal rate.  Pulmonary/Chest: Effort normal.  Abdominal: Soft. Bowel sounds are normal. She exhibits no distension.  Neurological: She is alert and oriented to person, place, and time.  Left chest wound with no ischemic tissue, no hematoma, no fluid collection.  Drain in place.  No erythema.   Consults: None  Disposition: Discharge disposition: 01-Home or Self Care       Discharge Instructions    Diet - low sodium heart healthy   Complete by: As directed    Increase activity slowly   Complete by: As directed      Allergies as of 05/04/2019      Reactions   Exenatide Nausea Only   Orlistat    Other reaction(s): Unknown   Penicillins Hives, Swelling   Thiazide-type Diuretics    Other reaction(s): Unknown      Medication List    TAKE these medications   CALCIUM-MAGNESIUM-ZINC PO Take 1 tablet by mouth every evening.   clopidogrel 75 MG tablet Commonly known as: PLAVIX Take 75 mg by mouth daily.   diazepam 2 MG tablet Commonly known as: Valium Take 1 tablet (2 mg total) by mouth every 12 (twelve)  hours as needed for muscle spasms.   glimepiride 4 MG tablet Commonly known as: AMARYL Take 4 mg by mouth 2 (two) times daily.   Janumet 50-500 MG tablet Generic drug: sitaGLIPtin-metformin Take 1 tablet by mouth 2 (two) times daily.   Jardiance 25 MG Tabs tablet Generic drug: empagliflozin Take 25 mg by mouth daily.   lansoprazole 15 MG capsule Commonly known as: PREVACID Take 15 mg by mouth daily.   lovastatin 40 MG tablet Commonly known as: MEVACOR Take 40 mg by mouth at bedtime.   metFORMIN 500 MG tablet Commonly known as: GLUCOPHAGE Take 500 mg by mouth 2 (two) times daily.   multivitamin tablet Take 1 tablet by mouth daily.   ondansetron 4 MG tablet Commonly known as: Zofran Take 1 tablet (4 mg total) by mouth every 8 (eight) hours as needed for nausea or vomiting.      Follow-up Information    Dillingham, Loel Lofty, DO In 1 week.   Specialty: Plastic Surgery Contact information: [address] [place] [address] [phone]        Herbert Pun, MD In 2 weeks.   Specialty: General Surgery Contact information: [address] [place] [address] [phone]

## 2019-05-04 NOTE — Plan of Care (Addendum)
Patient discharge instructions given to patient.  JP drain education and log given and patien table to demonstrated and retell drain and dressing instructions.  Patient verbalized understanding. PIV removed with tip intact. Patient ambulated independently in room.  Patient d/c to home.  Husband transported home

## 2019-05-06 LAB — SURGICAL PATHOLOGY

## 2019-05-10 ENCOUNTER — Telehealth: Payer: Self-pay

## 2019-05-10 NOTE — Telephone Encounter (Signed)
-----   Message from Earlie Server, MD sent at 05/07/2019  3:11 PM EST ----- Surgery is done.  Please send OncotypeDx and I will see patient 2 weeks after OncotypeDx being sent.  Thank you  Tammy Elliott

## 2019-05-10 NOTE — Telephone Encounter (Signed)
Done.. Pt is aware of her scheduled appt date and time

## 2019-05-10 NOTE — Telephone Encounter (Signed)
Oncotype submitted online today on specimen [id number] collected on 05/03/19.   Order: [id number]

## 2019-05-11 ENCOUNTER — Encounter: Payer: Self-pay | Admitting: Plastic Surgery

## 2019-05-11 ENCOUNTER — Encounter: Payer: Self-pay | Admitting: Oncology

## 2019-05-11 ENCOUNTER — Ambulatory Visit (INDEPENDENT_AMBULATORY_CARE_PROVIDER_SITE_OTHER): Payer: 59 | Admitting: Plastic Surgery

## 2019-05-11 ENCOUNTER — Other Ambulatory Visit: Payer: Self-pay

## 2019-05-11 VITALS — BP 128/75 | HR 78 | Temp 97.3°F

## 2019-05-11 DIAGNOSIS — Z9012 Acquired absence of left breast and nipple: Secondary | ICD-10-CM

## 2019-05-11 DIAGNOSIS — Z170 Estrogen receptor positive status [ER+]: Secondary | ICD-10-CM

## 2019-05-11 DIAGNOSIS — C50412 Malignant neoplasm of upper-outer quadrant of left female breast: Secondary | ICD-10-CM

## 2019-05-11 NOTE — Progress Notes (Signed)
   Subjective:    Patient ID: Tammy Elliott, female    DOB: Jul 17, 1950, 69 y.o.   MRN: [id number]  The patient is a 69 yrs old wf here with her husband for follow up after a left mastectomy with expander placement (05/03/19).  The drain is in place with minimal output.  Overall she said it was a rough experience but she is doing well.  The incision is healing well.  There is no sign of seroma or hematoma.  No sign of infection.  She is eating well, walking, voiding and pain is controlled.    Review of Systems  Constitutional: Negative.   HENT: Negative.   Eyes: Negative.   Respiratory: Negative.   Cardiovascular: Negative.   Gastrointestinal: Negative.   Genitourinary: Negative.   Hematological: Negative.        Objective:   Physical Exam Vitals and nursing note reviewed.  Constitutional:      Appearance: Normal appearance.  HENT:     Head: Normocephalic and atraumatic.  Cardiovascular:     Rate and Rhythm: Normal rate.  Pulmonary:     Effort: Pulmonary effort is normal.  Neurological:     General: No focal deficit present.     Mental Status: She is alert and oriented to person, place, and time.  Psychiatric:        Mood and Affect: Mood normal.        Behavior: Behavior normal.        Assessment & Plan:     ICD-10-CM   1. Malignant neoplasm of upper-outer quadrant of left breast in female, estrogen receptor positive ([place])  C50.412    Z17.0   2. S/P mastectomy, left  Z90.12      We placed injectable saline in the Expander using a sterile technique: Left: 50 cc for a total of 250 / 535 cc   The 21st Century Cures Act was signed into law in 2016 which includes the topic of electronic health records.  This provides immediate access to information in MyChart.  This includes consultation notes, operative notes, office notes, lab results and pathology reports.  If you have any questions about what you read please let us know at your next visit or call us at the office.   We are right here with you.

## 2019-05-17 NOTE — Telephone Encounter (Signed)
PA approval [id number] A Valid 05/17/19 to 08/15/19 Faxed to [employer] dept [phone])

## 2019-05-18 ENCOUNTER — Ambulatory Visit: Payer: 59 | Admitting: Plastic Surgery

## 2019-05-19 ENCOUNTER — Other Ambulatory Visit: Payer: Self-pay

## 2019-05-19 ENCOUNTER — Encounter: Payer: Self-pay | Admitting: Oncology

## 2019-05-19 ENCOUNTER — Encounter: Payer: Self-pay | Admitting: Surgical

## 2019-05-19 ENCOUNTER — Ambulatory Visit (INDEPENDENT_AMBULATORY_CARE_PROVIDER_SITE_OTHER): Payer: 59 | Admitting: Surgical

## 2019-05-19 VITALS — BP 134/73 | HR 91 | Temp 96.9°F | Ht 68.0 in | Wt 182.4 lb

## 2019-05-19 DIAGNOSIS — C50412 Malignant neoplasm of upper-outer quadrant of left female breast: Secondary | ICD-10-CM

## 2019-05-19 DIAGNOSIS — Z170 Estrogen receptor positive status [ER+]: Secondary | ICD-10-CM

## 2019-05-19 DIAGNOSIS — Z9012 Acquired absence of left breast and nipple: Secondary | ICD-10-CM

## 2019-05-19 NOTE — Progress Notes (Signed)
Subjective:     Patient ID: Tammy Elliott, female    DOB: [birth year], 69 y.o.   MRN: [id number]  Chief Complaint  Patient presents with  . Follow-up    1 week for fill and removal of drain    HPI: The patient is a 69 y.o. female here for follow-up after left breast reconstruction with placement of tissue expander and Flex HD on 05/03/2019 with Dr. Audelia Hives after total mastectomy and sentinel lymph node biopsy on the left by Dr. Windell Moment.  Today she is doing well.  She reports that the JP drain has been very uncomfortable and has been one of the the most difficult parts of this process.  JP drain has had minimal output over the past week, less than 5 cc/day.  She has been feeling well.  Incision is healing well.  There is no sign of any seroma, hematoma, infection.  She reports speaking with Dr. Windell Moment, general surgery about PT and was recommended to ask [place] for approval.  She would like to return to work sooner than 6 weeks as she feels capable of doing her job with her current limitations.  Would like to see PT prior to returning.  Pain has been adequately controlled with Tylenol.  Review of Systems  Constitutional: Negative.   Cardiovascular: Negative.    Objective:   Vital Signs BP 134/73 (BP Location: Right Arm, Patient Position: Sitting, Cuff Size: Normal)   Pulse 91   Temp (!) 96.9 F (36.1 C) (Temporal)   Ht 5\' 8"  (1.727 m)   Wt 182 lb 6.4 oz (82.7 kg)   SpO2 96%   BMI 27.73 kg/m  Vital Signs and Nursing Note Reviewed Chaperone present Physical Exam  Constitutional: She is oriented to person, place, and time and well-developed, well-nourished, and in no distress.  Pulmonary/Chest: Effort normal.    Incision is healing well, C/D/I.  Dermabond in place.  No periincisional erythema.  No dehiscence noted.  No drainage noted.  Mastectomy flaps with good color and normal temperature.  Neurological: She is alert and oriented to person, place, and time.   Skin: Skin is warm and dry. She is not diaphoretic. No erythema.  Psychiatric: Mood and affect normal.   Assessment/Plan:     ICD-10-CM   1. S/P mastectomy, left  Z90.12   2. Malignant neoplasm of upper-outer quadrant of left breast in female, estrogen receptor positive ([place])  C50.412    Z17.0     We removed left JP drain today.  No sign of hematoma, seroma, infection. Incision c/d/i. No drainage noted. Dermabond in place.  We placed injectable saline in the Expander using a sterile technique: Left: 20 cc for a total of 270 / 535 cc She is content with the current size of her left breast expander, follow-up in 1 week for evaluation of incision and to talk more about surgical plan.  She is aware that we need to wait at least 3 months from initial placement of expander to allow ADM to incorporate prior to replacement with expander. Recommend taking a valium today after her fill to relax muscle and decrease pain.   She would like to return to work earlier than 6 weeks, I think this is reasonable and she is doing well. Plan for return to work ~ 4 weeks post-op.  PT referral for assistance with range of motion and tightness in left chest/arm.  Pictures were obtained of the patient and placed in the chart with the patient's or  guardian's permission.   Carola Rhine Shallen Luedke, PA-C 05/19/2019, 10:57 AM

## 2019-05-20 ENCOUNTER — Encounter: Payer: Self-pay | Admitting: Oncology

## 2019-05-24 NOTE — Progress Notes (Signed)
Patient is a 70 year old female here for follow-up after left breast mastectomy and immediate reconstruction with placement of tissue expander and Flex HD on 05/03/19 with Dr. Windell Moment and Dr. Marla Roe.  Niel Hummer removed on 05/19/2019 and PT referral was placed.  She is scheduled for her first PT session on March 31.  Today patient reports that she feels she is doing very well.  Still experiencing limited ROM in left arm and looking forward to starting PT.  Denies pain, fever, shortness of breath, recent cold symptoms, redness or drainage from incision.  Has been adequately controlling her pain with Tylenol.  Reports she has not had to take the Norco at all.  She has been released by Dr. Peyton Najjar to go back to work next week.  Left breast incision is healing well, C/D/I.  No signs of infection, redness, drainage, seroma/hematoma.  Patient is pleased with her current size and feels she is fairly symmetric. Left: 0 cc for a total of 270 / 535 cc  She is interested in moving forward with the exchange to implant on the left and would like a mastopexy on the right side.  She understands and is okay with waiting until 3 months postop to complete the exchange to allow the Flex HD to fully incorporate.  (Estimate surgery in mid-May).  She is planning a beach trip for late June and hopes to have the surgery completed and be well-healed prior to this trip.  Follow-up in 2 to 3 weeks with Dr. Marla Roe to assess/confirm size, symmetry, and plan for surgery.  Call office with any questions/concerns.  The [place] was signed into law in 2016 which includes the topic of electronic health records.  This provides immediate access to information in MyChart.  This includes consultation notes, operative notes, office notes, lab results and pathology reports.  If you have any questions about what you read please let us know at your next visit or call us at the office.  We are right here with you.

## 2019-05-25 ENCOUNTER — Encounter: Payer: Self-pay | Admitting: Plastic Surgery

## 2019-05-25 ENCOUNTER — Other Ambulatory Visit: Payer: Self-pay

## 2019-05-25 ENCOUNTER — Ambulatory Visit (INDEPENDENT_AMBULATORY_CARE_PROVIDER_SITE_OTHER): Payer: 59 | Admitting: Plastic Surgery

## 2019-05-25 VITALS — BP 116/74 | HR 89 | Temp 96.8°F | Ht 68.0 in | Wt 182.6 lb

## 2019-05-25 DIAGNOSIS — C50412 Malignant neoplasm of upper-outer quadrant of left female breast: Secondary | ICD-10-CM

## 2019-05-25 DIAGNOSIS — Z9012 Acquired absence of left breast and nipple: Secondary | ICD-10-CM

## 2019-05-25 DIAGNOSIS — Z170 Estrogen receptor positive status [ER+]: Secondary | ICD-10-CM

## 2019-05-27 ENCOUNTER — Inpatient Hospital Stay: Payer: 59 | Attending: Oncology | Admitting: Oncology

## 2019-05-27 ENCOUNTER — Encounter: Payer: Self-pay | Admitting: Oncology

## 2019-05-27 ENCOUNTER — Other Ambulatory Visit: Payer: Self-pay

## 2019-05-27 VITALS — BP 118/61 | HR 72 | Temp 95.8°F | Resp 18 | Wt 181.9 lb

## 2019-05-27 DIAGNOSIS — C50812 Malignant neoplasm of overlapping sites of left female breast: Secondary | ICD-10-CM | POA: Diagnosis not present

## 2019-05-27 DIAGNOSIS — Z9012 Acquired absence of left breast and nipple: Secondary | ICD-10-CM | POA: Diagnosis not present

## 2019-05-27 DIAGNOSIS — Z8049 Family history of malignant neoplasm of other genital organs: Secondary | ICD-10-CM | POA: Insufficient documentation

## 2019-05-27 DIAGNOSIS — Z170 Estrogen receptor positive status [ER+]: Secondary | ICD-10-CM

## 2019-05-27 DIAGNOSIS — Z780 Asymptomatic menopausal state: Secondary | ICD-10-CM

## 2019-05-27 DIAGNOSIS — Z85828 Personal history of other malignant neoplasm of skin: Secondary | ICD-10-CM | POA: Diagnosis not present

## 2019-05-27 DIAGNOSIS — Z79811 Long term (current) use of aromatase inhibitors: Secondary | ICD-10-CM | POA: Diagnosis not present

## 2019-05-27 MED ORDER — ANASTROZOLE 1 MG PO TABS: 1.0000 mg | ORAL_TABLET | Freq: Every day | ORAL | 2 refills | Status: DC

## 2019-05-27 NOTE — Progress Notes (Signed)
Patient does not offer any problems today.  

## 2019-05-28 ENCOUNTER — Encounter: Payer: Self-pay | Admitting: Oncology

## 2019-05-29 ENCOUNTER — Encounter: Payer: Self-pay | Admitting: Oncology

## 2019-05-29 NOTE — Progress Notes (Signed)
Hematology/Oncology follow up note [facility] [phone] [phone]   Patient Care Team: Baxter Hire, MD as PCP - General (Internal Medicine) Leonie Man, MD as PCP - Cardiology (Cardiology) Rico Junker, RN as Registered Nurse Theodore Demark, RN as Registered Nurse  REFERRING PROVIDER: Baxter Hire, MD  CHIEF COMPLAINTS/REASON FOR VISIT:  Follow up of breast cancer  HISTORY OF PRESENTING ILLNESS:   Tammy Elliott is a  69 y.o.  female with PMH listed below was seen in consultation at the request of  Baxter Hire, MD  for evaluation of breast cancer Patient had screening mammogram done on 02/16/2019 which showed left breast asymmetry with distortion in the upper outer quadrant. 03/03/2019 left diagnostic mammogram showed small asymmetry noted on the screening study appears to be a small ill-defined mass with associated subtle distortion.  1:30 position of the left breast.  7 cm from the nipple. Sonographic evaluation of the left axillary showed no enlarged or abnormal lymph nodes. Patient underwent stereotactic core needle biopsy of the mass. 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  Grade 2, ER > 90%, PR 1 to 10%, HER-2 negative. Patient denies any family history of breast cancer. Menarche 69 years old, menopause 69 years old Remote use of OCP for about 10 to 15 years, Never used hormone replacement therapy. Denies any history of radiation to the chest.  03/13/2019 Breast MRI bilateral showed numerous suspicious enhancing masses through out the upper inner and upper outer left breast concerning for multicentric diseases. Suspicious findings span greater than 6cm in both AP and transverse dimensions.  MRI biopsy of left breast central middle and central anterior mass showed invasive mammary carcinoma, grade 1/2. INTERVAL  HISTORY Tammy Elliott is a 69 y.o. female who has above history reviewed by me today presents for follow up visit for management of breast cancer. Problems and complaints are listed below: 05/03/2019 patient underwent left total mastectomy, and SLNB.  Pathology showed invasive lobular carcinoma, multifocal, LCIS, 1 lymph node negative for malignancy.  pT2(m) pN0 (sn), Grade 2, Margins are not involved by invasive carcinoma.  ER 95%, PR 30%, HER2 IHC 2+ equivocal, FISH negative.    Patient presents to discuss adjuvant plan.     Review of Systems  Constitutional: Negative for appetite change, chills, fatigue and fever.  HENT:   Negative for hearing loss and voice change.   Eyes: Negative for eye problems.  Respiratory: Negative for chest tightness and cough.   Cardiovascular: Negative for chest pain.  Gastrointestinal: Negative for abdominal distention, abdominal pain and blood in stool.  Endocrine: Negative for hot flashes.  Genitourinary: Negative for difficulty urinating and frequency.   Musculoskeletal: Negative for arthralgias.  Skin: Negative for itching and rash.  Neurological: Negative for extremity weakness.  Hematological: Negative for adenopathy.  Psychiatric/Behavioral: Negative for confusion.    MEDICAL HISTORY:  Past Medical History:  Diagnosis Date  . Basal cell carcinoma    basal cell -skin cancer s/p removal  . CAD S/P percutaneous coronary angioplasty 2012   Dr. Saralyn Pilar ([place], [place]): DES PCI to LAD  . DM (diabetes mellitus), type 2 with complications (HCC)    CAD  . GERD (gastroesophageal reflux disease)   . Hypercholesterolemia   . Hyperlipidemia associated with type 2 diabetes mellitus ([place])    On lovastatin    SURGICAL HISTORY: Past Surgical History:  Procedure Laterality Date  .  BREAST BIOPSY Left 03/11/2019   stereo biopsy/ x clip/path pending  . BREAST RECONSTRUCTION WITH PLACEMENT OF TISSUE EXPANDER AND FLEX HD  (ACELLULAR HYDRATED DERMIS) Left 05/03/2019   Procedure: LEFT BREAST RECONSTRUCTION WITH PLACEMENT OF TISSUE EXPANDER AND FLEX HD (ACELLULAR HYDRATED DERMIS);  Surgeon: Wallace Going, DO;  Location: [facility] ORS;  Service: Plastics;  Laterality: Left;  . CARDIAC CATHETERIZATION    . CHOLECYSTECTOMY    . COLONOSCOPY    . CORONARY STENT INTERVENTION  2012   Dr. Saralyn Pilar ([place]): DES PCI pLAD  . NM GATED MYOVIEW (ARMX HX)  05/09/2017   [facility]) exercised 8:36 min with no chest pain or dyspnea.  No EKG changes.  Normal EF 66%.  No ischemia or infarction)  . TOTAL MASTECTOMY Left 05/03/2019   Procedure: TOTAL MASTECTOMY W/ Sentinel Node;  Surgeon: Herbert Pun, MD;  Location: [facility] ORS;  Service: General;  Laterality: Left;    SOCIAL HISTORY: Social History   Socioeconomic History  . Marital status: Married    Spouse name: Not on file  . Number of children: Not on file  . Years of education: Not on file  . Highest education level: Not on file  Occupational History  . Occupation: Printmaker: [employer]  Tobacco Use  . Smoking status: Never Smoker  . Smokeless tobacco: Never Used  Substance and Sexual Activity  . Alcohol use: Not Currently  . Drug use: Never  . Sexual activity: Not on file  Other Topics Concern  . Not on file  Social History Narrative   reports that she has never smoked. She has never used smokeless tobacco. She reports that she rarely drinks alcohol. She reports that she does not use drugs.      Married to [name] ([name]) [name]    Social Determinants of Health   Financial Resource Strain:   . Difficulty of Paying Living Expenses:   Food Insecurity:   . Worried About Charity fundraiser in the Last Year:   . Arboriculturist in the Last Year:   Transportation Needs:   . Film/video editor (Medical):   Marland Kitchen Lack of Transportation (Non-Medical):   Physical Activity:   . Days of Exercise per Week:   .  Minutes of Exercise per Session:   Stress:   . Feeling of Stress :   Social Connections:   . Frequency of Communication with Friends and Family:   . Frequency of Social Gatherings with Friends and Family:   . Attends Religious Services:   . Active Member of Clubs or Organizations:   . Attends Archivist Meetings:   Marland Kitchen Marital Status:   Intimate Partner Violence:   . Fear of Current or Ex-Partner:   . Emotionally Abused:   Marland Kitchen Physically Abused:   . Sexually Abused:     FAMILY HISTORY: Family History  Problem Relation Age of Onset  . CAD Mother   . Diabetes Mellitus II Mother   . Hypertension Mother   . Uterine cancer Mother   . CAD Father   . Heart attack Father   . Hypertension Father   . Diabetes Mellitus II Father        Took insulin  . Alcohol abuse Father   . Diabetes Mellitus II Sister   . Diabetes Other        Almost all sisters are diabetic or pre-diabetic   . Breast cancer Neg Hx     ALLERGIES:  is allergic to exenatide; orlistat; penicillins; and thiazide-type diuretics.  MEDICATIONS:  Current Outpatient Medications  Medication Sig Dispense Refill  . CALCIUM-MAGNESIUM-ZINC PO Take 1 tablet by mouth every evening.     . clopidogrel (PLAVIX) 75 MG tablet Take 75 mg by mouth daily.     Marland Kitchen glimepiride (AMARYL) 4 MG tablet Take 4 mg by mouth 2 (two) times daily.     Marland Kitchen JANUMET 50-500 MG tablet Take 1 tablet by mouth 2 (two) times daily.    Marland Kitchen JARDIANCE 25 MG TABS tablet Take 25 mg by mouth daily.     . lansoprazole (PREVACID) 15 MG capsule Take 15 mg by mouth daily.     Marland Kitchen lovastatin (MEVACOR) 40 MG tablet Take 40 mg by mouth at bedtime.     . metFORMIN (GLUCOPHAGE) 500 MG tablet Take 500 mg by mouth 2 (two) times daily.     . Multiple Vitamin (MULTIVITAMIN) tablet Take 1 tablet by mouth daily.    Marland Kitchen anastrozole (ARIMIDEX) 1 MG tablet Take 1 tablet (1 mg total) by mouth daily. 30 tablet 2  . diazepam (VALIUM) 2 MG tablet Take 1 tablet (2 mg total) by mouth  every 12 (twelve) hours as needed for muscle spasms. (Patient not taking: Reported on 05/27/2019) 20 tablet 0  . ondansetron (ZOFRAN) 4 MG tablet Take 1 tablet (4 mg total) by mouth every 8 (eight) hours as needed for nausea or vomiting. (Patient not taking: Reported on 05/27/2019) 20 tablet 0   No current facility-administered medications for this visit.     PHYSICAL EXAMINATION: ECOG PERFORMANCE STATUS: 0 - Asymptomatic Vitals:   05/27/19 0956  BP: 118/61  Pulse: 72  Resp: 18  Temp: (!) 95.8 F (35.4 C)   Filed Weights   05/27/19 0956  Weight: 181 lb 14.4 oz (82.5 kg)    Physical Exam Constitutional:      General: She is not in acute distress. HENT:     Head: Normocephalic and atraumatic.  Eyes:     General: No scleral icterus.    Pupils: Pupils are equal, round, and reactive to light.  Cardiovascular:     Rate and Rhythm: Normal rate and regular rhythm.     Heart sounds: Normal heart sounds.  Pulmonary:     Effort: Pulmonary effort is normal. No respiratory distress.     Breath sounds: No wheezing.  Abdominal:     General: Bowel sounds are normal. There is no distension.     Palpations: Abdomen is soft. There is no mass.     Tenderness: There is no abdominal tenderness.  Musculoskeletal:        General: No deformity. Normal range of motion.     Cervical back: Normal range of motion and neck supple.  Skin:    General: Skin is warm and dry.     Findings: No erythema or rash.  Neurological:     Mental Status: She is alert and oriented to person, place, and time. Mental status is at baseline.     Cranial Nerves: No cranial nerve deficit.     Coordination: Coordination normal.  Psychiatric:        Mood and Affect: Mood normal.    LABORATORY DATA:  I have reviewed the data as listed Lab Results  Component Value Date   WBC 5.3 03/23/2019   HGB 14.2 03/23/2019   HCT 44.2 03/23/2019   MCV 95.3 03/23/2019   PLT 227 03/23/2019   Recent Labs    03/23/19 1005  05/03/19 1911  NA 138  --   K 4.4  --   CL 101  --   CO2 25  --   GLUCOSE 219*  --   BUN 18  --   CREATININE 0.97 0.94  CALCIUM 9.5  --   GFRNONAA >60 >60  GFRAA >60 >60  PROT 7.6  --   ALBUMIN 4.2  --   AST 23  --   ALT 23  --   ALKPHOS 57  --   BILITOT 0.6  --    Iron/TIBC/Ferritin/ %Sat No results found for: IRON, TIBC, FERRITIN, IRONPCTSAT    RADIOGRAPHIC STUDIES: I have personally reviewed the radiological images as listed and agreed with the findings in the report.  NM SENTINEL NODE INJECTION  Result Date: 05/03/2019 CLINICAL DATA:  Left breast cancer. EXAM: NUCLEAR MEDICINE BREAST LYMPHOSCINTIGRAPHY LEFT TECHNIQUE: Laterality was confirmed and marked. Intradermal injection of radiopharmaceutical was performed at the 12 o'clock, 3 o'clock, 6 o'clock, and 9 o'clock positions around the left nipple. The patient was then sent to the operating room where the sentinel node(s) were identified and removed by the surgeon. RADIOPHARMACEUTICALS:  Total of 1.06 mCi Millipore-filtered Technetium-20msulfur colloid, injected in four aliquots of 0.25 mCi each. IMPRESSION: Uncomplicated intradermal injection of a total of 1.06 mCi Technetium-958mulfur colloid for purposes of sentinel node identification. Electronically Signed   By: D Lucrezia Europe.D.   On: 05/03/2019 10:06      ASSESSMENT & PLAN:  1. Malignant neoplasm of overlapping sites of left breast in female, estrogen receptor positive ([place]  2. Post-menopause   3. Aromatase inhibitor use   Cancer Staging Malignant neoplasm of upper-outer quadrant of left breast in female, estrogen receptor positive ([facility]ass[place]aging form: Breast, AJCC 8th Edition - Clinical: Stage IA (cT1b, cN0, cM0, G2, ER+, PR+, HER2-) - Signed by YuEarlie ServerMD on 03/23/2019 - Pathologic: Stage IA (pT2, pN0, cM0, G2, ER+, PR+, HER2-, Oncotype DX score: 19) - Signed by YuEarlie ServerMD on 05/29/2019  Pathology was reviewed and discussed with patient. pT2(m) pN0 (sn),  Stage IA OncotypeDX recurrence score is 19, patient is postmenopausal, chemotherapy benefit is <1%, I will not offer adjuvant chemotherapy.  I discussed with her about adjuvant endocrine therapy treatments.  Rationale of using aromatase inhibitor -Arimidex  discussed with patient.  Side effects of Arimidex including but not limited to hot flush, joint pain, fatigue, mood swing, osteoporosis discussed with patient. Patient voices understanding and willing to proceed. Rx was sent to pharmacy.   Recommend patient to obtain baseline bone density evaluation.   Follow up in 4 weeks.    Orders Placed This Encounter  Procedures  . DG Bone Density    Standing Status:   Future    Standing Expiration Date:   05/26/2020    Order Specific Question:   Reason for Exam (SYMPTOM  OR DIAGNOSIS REQUIRED)    Answer:   post menopausal AI use baseline    Order Specific Question:   Preferred imaging location?    Answer:   [facility]  . CBC with Differential/Platelet    Standing Status:   Future    Standing Expiration Date:   05/26/2020  . Comprehensive metabolic panel    Standing Status:   Future    Standing Expiration Date:   05/26/2020    All questions were answered. The patient knows to call the clinic with any problems questions or concerns.  cc JoBaxter HireMD    Return  of visit: 4 weeks.  Earlie Server, MD, PhD Hematology Oncology [facility] at [facility] Pager- [phone] 05/29/2019

## 2019-05-30 ENCOUNTER — Encounter: Payer: Self-pay | Admitting: Oncology

## 2019-06-01 ENCOUNTER — Ambulatory Visit
Admission: RE | Admit: 2019-06-01 | Discharge: 2019-06-01 | Disposition: A | Discharge status: Home / Self Care | Payer: 59 | Source: Ambulatory Visit | Attending: Oncology | Admitting: Oncology

## 2019-06-01 DIAGNOSIS — Z79811 Long term (current) use of aromatase inhibitors: Secondary | ICD-10-CM | POA: Insufficient documentation

## 2019-06-01 DIAGNOSIS — Z780 Asymptomatic menopausal state: Secondary | ICD-10-CM | POA: Insufficient documentation

## 2019-06-03 ENCOUNTER — Encounter: Payer: Self-pay | Admitting: Oncology

## 2019-06-07 ENCOUNTER — Encounter: Payer: Self-pay | Admitting: Plastic Surgery

## 2019-06-07 ENCOUNTER — Other Ambulatory Visit: Payer: Self-pay

## 2019-06-07 ENCOUNTER — Ambulatory Visit (INDEPENDENT_AMBULATORY_CARE_PROVIDER_SITE_OTHER): Payer: 59 | Admitting: Plastic Surgery

## 2019-06-07 VITALS — BP 121/72 | HR 84 | Temp 96.9°F | Ht 68.0 in | Wt 182.0 lb

## 2019-06-07 DIAGNOSIS — Z170 Estrogen receptor positive status [ER+]: Secondary | ICD-10-CM

## 2019-06-07 DIAGNOSIS — C50412 Malignant neoplasm of upper-outer quadrant of left female breast: Secondary | ICD-10-CM

## 2019-06-07 DIAGNOSIS — Z9012 Acquired absence of left breast and nipple: Secondary | ICD-10-CM

## 2019-06-07 NOTE — Progress Notes (Signed)
   Subjective:    Patient ID: Tammy Elliott, female    DOB: 27-Mar-1950, 69 y.o.   MRN: [id number]  The patient is a 69 year old female here for follow-up on her left breast surgery.  She had breast cancer and underwent a mastectomy with expander and FlexHD placement 2/22.  She has been doing very well.  The incision is healing nicely.  There is no sign of seroma, hematoma or infection.  She is pleased with her progress and size.  Her pain is well controlled.   Review of Systems  Constitutional: Negative.   HENT: Negative.   Eyes: Negative.   Respiratory: Negative.   Cardiovascular: Negative.   Genitourinary: Negative.   Musculoskeletal: Negative.        Objective:   Physical Exam Vitals and nursing note reviewed.  Constitutional:      Appearance: Normal appearance.  Cardiovascular:     Rate and Rhythm: Normal rate.     Pulses: Normal pulses.  Neurological:     General: No focal deficit present.     Mental Status: She is alert and oriented to person, place, and time.  Psychiatric:        Mood and Affect: Mood normal.        Behavior: Behavior normal.        Thought Content: Thought content normal.        Assessment & Plan:     ICD-10-CM   1. S/P mastectomy, left  Z90.12   2. Malignant neoplasm of upper-outer quadrant of left breast in female, estrogen receptor positive ([place])  C50.412    Z17.0     We placed injectable saline in the Expander using a sterile technique: Left: 50 cc for a total of 320 / 535 cc  Patient would like to plan on exchange of the left expander for an implant and right mastopexy. She would like to have it completed before her June vacation.

## 2019-06-09 ENCOUNTER — Other Ambulatory Visit: Payer: Self-pay

## 2019-06-09 ENCOUNTER — Ambulatory Visit: Payer: 59 | Attending: Surgical

## 2019-06-09 DIAGNOSIS — M25612 Stiffness of left shoulder, not elsewhere classified: Secondary | ICD-10-CM | POA: Insufficient documentation

## 2019-06-09 DIAGNOSIS — M25512 Pain in left shoulder: Secondary | ICD-10-CM | POA: Diagnosis present

## 2019-06-09 NOTE — Therapy (Signed)
[place] [facility] [address], [place], [address] Phone: [phone]   Fax:  [phone]  Physical Therapy Evaluation  Patient Details  Name: Tammy Elliott MRN: [id number] Date of Birth: 1950-05-17 Referring Provider (PT): Scheeler MD   Encounter Date: 06/09/2019  PT End of Session - 06/09/19 1827    Visit Number  1    Number of Visits  13    Date for PT Re-Evaluation  07/21/19    Authorization Type  1/10 Medicare    PT Start Time  [id number]    PT Stop Time  1815    PT Time Calculation (min)  60 min    Activity Tolerance  Patient tolerated treatment well    Behavior During Therapy  [facility] for tasks assessed/performed       Past Medical History:  Diagnosis Date  . Basal cell carcinoma    basal cell -skin cancer s/p removal  . CAD S/P percutaneous coronary angioplasty 2012   Dr. Saralyn Pilar ([place], [place]): DES PCI to LAD  . DM (diabetes mellitus), type 2 with complications (HCC)    CAD  . GERD (gastroesophageal reflux disease)   . Hypercholesterolemia   . Hyperlipidemia associated with type 2 diabetes mellitus ([place])    On lovastatin    Past Surgical History:  Procedure Laterality Date  . BREAST BIOPSY Left 03/11/2019   stereo biopsy/ x clip/path pending  . BREAST RECONSTRUCTION WITH PLACEMENT OF TISSUE EXPANDER AND FLEX HD (ACELLULAR HYDRATED DERMIS) Left 05/03/2019   Procedure: LEFT BREAST RECONSTRUCTION WITH PLACEMENT OF TISSUE EXPANDER AND FLEX HD (ACELLULAR HYDRATED DERMIS);  Surgeon: Wallace Going, DO;  Location: [facility] ORS;  Service: Plastics;  Laterality: Left;  . CARDIAC CATHETERIZATION    . CHOLECYSTECTOMY    . COLONOSCOPY    . CORONARY STENT INTERVENTION  2012   Dr. Saralyn Pilar ([place]): DES PCI pLAD  . NM GATED MYOVIEW (ARMX HX)  05/09/2017   [facility]) exercised 8:36 min with no chest pain or dyspnea.  No EKG changes.  Normal EF 66%.  No  ischemia or infarction)  . TOTAL MASTECTOMY Left 05/03/2019   Procedure: TOTAL MASTECTOMY W/ Sentinel Node;  Surgeon: Herbert Pun, MD;  Location: [facility] ORS;  Service: General;  Laterality: Left;    There were no vitals filed for this visit.   Subjective Assessment - 06/09/19 1819    Subjective  Patient reports increased L shoulder stiffness with difficulty with lifting her UE overhead s/p massectomy on 05/03/2019. Patient states she was dx with CA of the breast 03/12/2019 which found several lesions along the L breast resulting in the ned for the massectomy. Patient states she currently has a filler in her L breast which is 100% filled as of 06/07/2019. Patient reports she has been trying to use her arm as normally as possible but states increased uncomfortableness with reaching overhead and raising the arm above 90 degrees of shoulder flexion/abd. Patient reports she practices target practice and the stiffness/discomfort has been limiting in the performance of this activity. Not only that, but the patient also reports increased difficulty with reaching to grab an obbject off of a high shelf and using her arm for cooking. Patient states she would like to improve the flexibility of her shoulder to return to prior level of function.    Pertinent History  Hx of CA breast 03/12/2019; massectomy 05/03/2019, DMII    Limitations  Lifting  Diagnostic tests  [place], mammogram + breast CA    Patient Stated Goals  Regain use of L Shoulder    Currently in Pain?  Yes    Pain Score  1    worst 4/10   Pain Location  Shoulder    Pain Orientation  Left    Pain Descriptors / Indicators  Discomfort    Pain Type  Surgical pain    Pain Onset  More than a month ago    Pain Frequency  Intermittent    Aggravating Factors   Reaching overhead         [facility] PT Assessment - 06/09/19 1837      Assessment   Medical Diagnosis  L shoulder stiffness/pain    Referring Provider (PT)  Scheeler MD    Onset Date/Surgical  Date  05/02/19    Hand Dominance  Right    Next MD Visit  unknown    Prior Therapy  no      Balance Screen   Has the patient fallen in the past 6 months  No    Has the patient had a decrease in activity level because of a fear of falling?   No    Is the patient reluctant to leave their home because of a fear of falling?   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, computer work    Leisure  Pension scheme manager, gardening, exercising      Cognition   Overall Cognitive Status  Within Functional Limits for tasks assessed      Observation/Other Assessments   Observations  Increased mass superior to spacer on the L      Sensation   Light Touch  Appears Intact      Functional Tests   Functional tests  Other      Other:   Other/ Comments  Finger together overhead lift: inhibited       Posture/Postural Control   Posture Comments  FHP, thoracic kyphosis      ROM / Strength   AROM / PROM / Strength  AROM;PROM;Strength      AROM   AROM Assessment Site  Shoulder;Elbow    Right/Left Shoulder  Left;Right    Right Shoulder Extension  40 Degrees    Right Shoulder Flexion  165 Degrees    Right Shoulder ABduction  165 Degrees    Right Shoulder Internal Rotation  75 Degrees    Right Shoulder External Rotation  85 Degrees    Left Shoulder Extension  30 Degrees    Left Shoulder Flexion  100 Degrees    Left Shoulder ABduction  95 Degrees    Left Shoulder Internal Rotation  65 Degrees    Left Shoulder External Rotation  75 Degrees    Right/Left Elbow  Left;Right    Right Elbow Flexion  --   WNL   Right Elbow Extension  --   WNL   Left Elbow Flexion  --   WNL   Left Elbow Extension  --   WNL     PROM   PROM Assessment Site  Shoulder;Elbow    Right/Left  Shoulder  Right;Left    Right Shoulder Extension  40 Degrees  Right Shoulder Flexion  165 Degrees    Right Shoulder ABduction  165 Degrees    Right Shoulder Internal Rotation  85 Degrees    Right Shoulder External Rotation  85 Degrees    Left Shoulder Extension  40 Degrees    Left Shoulder Flexion  135 Degrees   limited by pain   Left Shoulder ABduction  130 Degrees   limited by pain   Left Shoulder Internal Rotation  70 Degrees   limited by pain   Left Shoulder External Rotation  80 Degrees   limited by pain   Right/Left Elbow  Right;Left    Right Elbow Flexion  --   WNL   Right Elbow Extension  --   WNL   Left Elbow Flexion  --   WNL   Left Elbow Extension  --   WNL     Strength   Strength Assessment Site  Elbow;Shoulder    Right/Left Shoulder  Right;Left    Right Shoulder Flexion  5/5    Right Shoulder ABduction  5/5    Right Shoulder Internal Rotation  4+/5    Right Shoulder External Rotation  4+/5    Left Shoulder Flexion  4/5    Left Shoulder ABduction  3+/5    Left Shoulder Internal Rotation  4/5    Left Shoulder External Rotation  4/5    Right/Left Elbow  Left;Right    Right Elbow Flexion  5/5    Right Elbow Extension  5/5    Left Elbow Flexion  5/5    Left Elbow Extension  5/5      Palpation   Palpation comment  TTP: increased tenderness and pain along pec major, serratus ant       Objective measurements completed on examination: See above findings.     TREATMENT Therapeutic Exercise Shoulder AAROM in supine -- x 10  Shoulder pulleys in sitting --x 10 Serratus punches in sitting with RTB --x 10   Performed exercises to decrease increased pain and improve spasms.         PT Education - 06/09/19 1826    Education Details  POC, form/technique with exercise, supine shoulder AAROM, pulleys shoulder flexion, serratus punches with RTB in sitting    Person(s) Educated  Patient    Methods  Explanation;Demonstration;Handout    Comprehension   Returned demonstration;Verbalized understanding       PT Short Term Goals - 06/09/19 1832      PT SHORT TERM GOAL #1   Title  Patient will be independent with HEP to continue benefits of therapy after discharge    Baseline  dependnent with form and progressions    Time  6    Period  Weeks    Status  New    Target Date  07/21/19        PT Long Term Goals - 06/09/19 1833      PT LONG TERM GOAL #1   Title  Patient will have all AROM equal to the unaffected side in order to regain full use of L UE especially for reaching overhead    Baseline  Shoulder flexion: 100; abduction; 95    Time  6    Period  Weeks    Status  New    Target Date  07/21/19      PT LONG TERM GOAL #2   Title  Patient will be able to return to a full gym program with high confidence and independence to return to recreation activities  and improve UE fitness.    Baseline  Unable to return secondary to pain and limitations    Time  6    Period  Weeks    Status  New    Target Date  07/21/19      PT LONG TERM GOAL #3   Title  Patient will have a significant improvement in her FOTO score to indicate improvement in functional use of her affected UE and be able to carry items without increase in pain    Time  6    Period  Weeks    Status  New    Target Date  07/21/19             Plan - 06/09/19 1827    Clinical Impression Statement  Patient is a 69 yo right hand dominant female presenting with increased discomfortable and decreased L shoulder AROM s/p massectomy on 05/03/2019. Patient demonstrates L shoulder dysfunction as indicated by decreased shoulder AROM in flexion, abduction, IR, and ER accompanied by an increase in pain. Patient also demonstrates increased muscular guarding around the pectoral musculature limiting overhead motion. Patient with good strength within available AROM but will address this further as AROM is regained. Patient will benefit from further skilled therapy focused on improving  these limitations to return to prior level of function.    Personal Factors and Comorbidities  Age;Comorbidity 3+    Comorbidities  CA dx, massectomy, DMII    Examination-Activity Limitations  Carry;Reach Overhead;Bathing;Hygiene/Grooming    Examination-Participation Restrictions  Meal Prep    Stability/Clinical Decision Making  Stable/Uncomplicated    Clinical Decision Making  Low    Rehab Potential  Good    PT Frequency  2x / week    PT Duration  6 weeks    PT Treatment/Interventions  Electrical Stimulation;Iontophoresis 4mg /ml Dexamethasone;Cryotherapy;Ultrasound;Moist Heat;Therapeutic exercise;Manual techniques;Patient/family education;Passive range of motion;Dry needling;Scar mobilization;Joint Manipulations    PT Next Visit Plan  progress strengthening and improvement of AROM    PT Home Exercise Plan  See education section       Patient will benefit from skilled therapeutic intervention in order to improve the following deficits and impairments:  Increased fascial restricitons, Pain, Decreased coordination, Decreased mobility, Increased muscle spasms, Postural dysfunction, Decreased range of motion, Decreased endurance, Decreased strength, Hypomobility  Visit Diagnosis: Stiffness of left shoulder, not elsewhere classified  Acute pain of left shoulder     Problem List Patient Active Problem List   Diagnosis Date Noted  . S/P mastectomy, left 05/11/2019  . Breast cancer ([place]) 05/03/2019  . Malignant neoplasm of upper-outer quadrant of left breast in female, estrogen receptor positive ([place]) 03/23/2019  . CAD S/P percutaneous coronary angioplasty 05/04/2018    Blythe Stanford, PT DPT 06/09/2019, 6:47 PM  [place] [facility] [address], [place], [address] Phone: [phone]   Fax:  [phone]  Name: Tammy Elliott MRN: [id number] Date of Birth: 02/03/1951

## 2019-06-15 ENCOUNTER — Ambulatory Visit: Payer: 59 | Attending: Surgical

## 2019-06-15 ENCOUNTER — Other Ambulatory Visit: Payer: Self-pay

## 2019-06-15 DIAGNOSIS — M25612 Stiffness of left shoulder, not elsewhere classified: Secondary | ICD-10-CM | POA: Insufficient documentation

## 2019-06-15 DIAGNOSIS — M25512 Pain in left shoulder: Secondary | ICD-10-CM | POA: Insufficient documentation

## 2019-06-15 NOTE — Therapy (Signed)
[place] [facility] [address] [place], [address] Phone: [phone]   Fax:  [phone]  Physical Therapy Treatment  Patient Details  Name: Tammy Elliott MRN: [id number] Date of Birth: Jun 22, 1950 Referring Provider (PT): Scheeler MD   Encounter Date: 06/15/2019  PT End of Session - 06/15/19 1623    Visit Number  2    Number of Visits  13    Date for PT Re-Evaluation  07/21/19    Authorization Type  2/10 Medicare    PT Start Time  1615    PT Stop Time  1700    PT Time Calculation (min)  45 min    Activity Tolerance  Patient tolerated treatment well    Behavior During Therapy  [facility] for tasks assessed/performed       Past Medical History:  Diagnosis Date  . Basal cell carcinoma    basal cell -skin cancer s/p removal  . CAD S/P percutaneous coronary angioplasty 2012   Dr. Saralyn Pilar ([place], [place]): DES PCI to LAD  . DM (diabetes mellitus), type 2 with complications (HCC)    CAD  . GERD (gastroesophageal reflux disease)   . Hypercholesterolemia   . Hyperlipidemia associated with type 2 diabetes mellitus ([place])    On lovastatin    Past Surgical History:  Procedure Laterality Date  . BREAST BIOPSY Left 03/11/2019   stereo biopsy/ x clip/path pending  . BREAST RECONSTRUCTION WITH PLACEMENT OF TISSUE EXPANDER AND FLEX HD (ACELLULAR HYDRATED DERMIS) Left 05/03/2019   Procedure: LEFT BREAST RECONSTRUCTION WITH PLACEMENT OF TISSUE EXPANDER AND FLEX HD (ACELLULAR HYDRATED DERMIS);  Surgeon: Wallace Going, DO;  Location: [facility] ORS;  Service: Plastics;  Laterality: Left;  . CARDIAC CATHETERIZATION    . CHOLECYSTECTOMY    . COLONOSCOPY    . CORONARY STENT INTERVENTION  2012   Dr. Saralyn Pilar ([place]): DES PCI pLAD  . NM GATED MYOVIEW (ARMX HX)  05/09/2017   [facility]) exercised 8:36 min with no chest pain or dyspnea.  No EKG changes.  Normal EF 66%.  No  ischemia or infarction)  . TOTAL MASTECTOMY Left 05/03/2019   Procedure: TOTAL MASTECTOMY W/ Sentinel Node;  Surgeon: Herbert Pun, MD;  Location: [facility] ORS;  Service: General;  Laterality: Left;    There were no vitals filed for this visit.  Subjective Assessment - 06/15/19 1621    Subjective  Patient reports no pain currently and states it's been easier to move her arm. Patient reports she has been performing her exercises which she reports has been improving.    Pertinent History  Hx of CA breast 03/12/2019; massectomy 05/03/2019, DMII    Limitations  Lifting    Diagnostic tests  [place], mammogram + breast CA    Patient Stated Goals  Regain use of L Shoulder    Currently in Pain?  No/denies    Pain Onset  More than a month ago       TREATMENT Therapeutic Exercise Shoulder flexion with therapist assistance - x 10  Corner stretch - 5 sec x 10 Doorway stretch with rotation for pec stretching - x 10  AAROM shoulder flexion - x 10  Shoulder ER with patient in sitting with GTB - x 10  Shoulder extension in standing with YTB - x 10  Push up PLUS on the wall - x 10 Manual Therapy STM performed along the pec major to improve overhead flexion and shoulder ER such  as activities requiring the washing of her hair.  FOTO: 66   PT Education - 06/15/19 1622    Education Details  form/technique with exercise    Person(s) Educated  Patient    Methods  Explanation;Demonstration    Comprehension  Verbalized understanding;Returned demonstration       PT Short Term Goals - 06/09/19 1832      PT SHORT TERM GOAL #1   Title  Patient will be independent with HEP to continue benefits of therapy after discharge    Baseline  dependnent with form and progressions    Time  6    Period  Weeks    Status  New    Target Date  07/21/19        PT Long Term Goals - 06/09/19 1833      PT LONG TERM GOAL #1   Title  Patient will have all AROM equal to the unaffected side in order to regain full  use of L UE especially for reaching overhead    Baseline  Shoulder flexion: 100; abduction; 95    Time  6    Period  Weeks    Status  New    Target Date  07/21/19      PT LONG TERM GOAL #2   Title  Patient will be able to return to a full gym program with high confidence and independence to return to recreation activities and improve UE fitness.    Baseline  Unable to return secondary to pain and limitations    Time  6    Period  Weeks    Status  New    Target Date  07/21/19      PT LONG TERM GOAL #3   Title  Patient will have a significant improvement in her FOTO score to indicate improvement in functional use of her affected UE and be able to carry items without increase in pain    Time  6    Period  Weeks    Status  New    Target Date  07/21/19            Plan - 06/15/19 1641    Clinical Impression Statement  Patient demonstrates significant improvement with overhead flexion and ER in sitting with ability to perform throughout greater AROM in standing. Patient continues to have dificulty with performing full AROM with the R shoulder. Patient demonstrates singificant tightness along the pec major limiting overhead movement and patient will benefit from further skilled therapy to return to prior level of function.    Personal Factors and Comorbidities  Age;Comorbidity 3+    Comorbidities  CA dx, massectomy, DMII    Examination-Activity Limitations  Carry;Reach Overhead;Bathing;Hygiene/Grooming    Examination-Participation Restrictions  Meal Prep    Stability/Clinical Decision Making  Stable/Uncomplicated    Rehab Potential  Good    PT Frequency  2x / week    PT Duration  6 weeks    PT Treatment/Interventions  Electrical Stimulation;Iontophoresis 4mg /ml Dexamethasone;Cryotherapy;Ultrasound;Moist Heat;Therapeutic exercise;Manual techniques;Patient/family education;Passive range of motion;Dry needling;Scar mobilization;Joint Manipulations    PT Next Visit Plan  progress  strengthening and improvement of AROM    PT Home Exercise Plan  See education section       Patient will benefit from skilled therapeutic intervention in order to improve the following deficits and impairments:  Increased fascial restricitons, Pain, Decreased coordination, Decreased mobility, Increased muscle spasms, Postural dysfunction, Decreased range of motion, Decreased endurance, Decreased strength, Hypomobility  Visit Diagnosis: Stiffness of  left shoulder, not elsewhere classified  Acute pain of left shoulder     Problem List Patient Active Problem List   Diagnosis Date Noted  . S/P mastectomy, left 05/11/2019  . Breast cancer ([place]) 05/03/2019  . Malignant neoplasm of upper-outer quadrant of left breast in female, estrogen receptor positive ([place]) 03/23/2019  . CAD S/P percutaneous coronary angioplasty 05/04/2018    Blythe Stanford 06/15/2019, 5:00 PM  [place] [facility] [address] [place], [address] Phone: [phone]   Fax:  [phone]  Name: JAQUELA SHAKER MRN: [id number] Date of Birth: 1950-04-27

## 2019-06-22 ENCOUNTER — Other Ambulatory Visit: Payer: Self-pay

## 2019-06-22 ENCOUNTER — Encounter (HOSPITAL_BASED_OUTPATIENT_CLINIC_OR_DEPARTMENT_OTHER): Payer: Self-pay | Admitting: Plastic Surgery

## 2019-06-22 ENCOUNTER — Telehealth: Payer: Self-pay | Admitting: Plastic Surgery

## 2019-06-22 ENCOUNTER — Ambulatory Visit: Payer: 59

## 2019-06-22 ENCOUNTER — Ambulatory Visit (INDEPENDENT_AMBULATORY_CARE_PROVIDER_SITE_OTHER): Payer: 59 | Admitting: Surgical

## 2019-06-22 ENCOUNTER — Encounter: Payer: Self-pay | Admitting: Surgical

## 2019-06-22 VITALS — BP 120/67 | HR 81 | Temp 97.5°F | Ht 68.0 in | Wt 183.0 lb

## 2019-06-22 DIAGNOSIS — C50412 Malignant neoplasm of upper-outer quadrant of left female breast: Secondary | ICD-10-CM

## 2019-06-22 DIAGNOSIS — M25512 Pain in left shoulder: Secondary | ICD-10-CM

## 2019-06-22 DIAGNOSIS — M25612 Stiffness of left shoulder, not elsewhere classified: Secondary | ICD-10-CM

## 2019-06-22 DIAGNOSIS — Z9012 Acquired absence of left breast and nipple: Secondary | ICD-10-CM

## 2019-06-22 DIAGNOSIS — Z170 Estrogen receptor positive status [ER+]: Secondary | ICD-10-CM

## 2019-06-22 MED ORDER — CIPROFLOXACIN HCL 500 MG PO TABS: 500.0000 mg | ORAL_TABLET | Freq: Two times a day (BID) | ORAL | 0 refills | Status: AC

## 2019-06-22 NOTE — Telephone Encounter (Signed)
Dawn from [facility] called to advise she needed a surgical cardiac clearance for patient and needs to know how long to hold plavix. She can be reached at [phone]. Fax clearance to [phone].

## 2019-06-22 NOTE — Progress Notes (Signed)
Patient ID: Tammy Elliott, female    DOB: 07/27/1950, 69 y.o.   MRN: [id number]  Chief Complaint  Patient presents with  . Pre-op Exam    H&P SX: 07/01/19 removal of left breast expander with placement of implant and right mastopexy      ICD-10-CM   1. S/P mastectomy, left  Z90.12   2. Malignant neoplasm of upper-outer quadrant of left breast in female, estrogen receptor positive (Tammy Elliott)  C50.412    Z17.0      History of Present Illness: Tammy Elliott is a 69 y.o.  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  She underwent left mastectomy by Dr. Windell Moment and left breast reconstruction with placement of tissue expander and Flex HD on 05/03/2019 by Dr. Marla Roe.  She presents for preoperative evaluation for upcoming procedure, removal of left tissue expander and placement of left breast implant, right breast mastopexy for symmetry, scheduled for 07/01/2019 with Dr. Marla Roe.  The patient has not had problems with anesthesia.  No personal or family history of DVT/PE.  No family or personal history of bleeding or clotting disorders.  Patient is a non-smoker.    Past medical history of type 2 diabetes mellitus, coronary artery disease status post stent in LAD 2012.  History of hyperlipidemia.  Patient reports that she has been feeling great lately, she has been doing a lot of yard work and is very active.  Most recent A1c 7.2, patient reports she had splurged over the holidays and A1c is typically lower.  She reports that PT has been very helpful and she feels like she has great range of motion of her left arm.  She is currently on Plavix, prescribed by her cardiologist who placed her stent.  Prior to previous surgery she did have her Plavix held for 5 days.  Patient reports that she is unsure if she would like an implant on the right to match  the left pending any size discrepancy.  She reports that she is fine going a little bit smaller with the left reconstructed side to match the natural right breast or placing an implant on the right to match the left.  She does not have a preference and would be pleased with either option as long as she is fairly symmetric.   Past Medical History: Allergies: Allergies  Allergen Reactions  . Exenatide Nausea Only  . Orlistat     Other reaction(s): Unknown  . Penicillins Hives and Swelling  . Thiazide-Type Diuretics     Other reaction(s): Unknown    Current Medications:  Current Outpatient Medications:  .  anastrozole (ARIMIDEX) 1 MG tablet, Take 1 tablet (1 mg total) by mouth daily., Disp: 30 tablet, Rfl: 2 .  CALCIUM-MAGNESIUM-ZINC PO, Take 1 tablet by mouth every evening. , Disp: , Rfl:  .  clopidogrel (PLAVIX) 75 MG tablet, Take 75 mg by mouth daily. , Disp: , Rfl:  .  diazepam (VALIUM) 2 MG tablet, Take 1 tablet (2 mg total) by mouth every 12 (twelve) hours as needed for muscle spasms., Disp: 20 tablet, Rfl: 0 .  glimepiride (AMARYL) 4 MG tablet, Take 4 mg by mouth 2 (two) times daily. , Disp: , Rfl:  .  JANUMET 50-500 MG tablet, Take 1 tablet by mouth 2 (two) times daily., Disp: , Rfl:  .  JARDIANCE 25 MG TABS tablet,  Take 25 mg by mouth daily. , Disp: , Rfl:  .  lansoprazole (PREVACID) 15 MG capsule, Take 15 mg by mouth daily. , Disp: , Rfl:  .  lovastatin (MEVACOR) 40 MG tablet, Take 40 mg by mouth at bedtime. , Disp: , Rfl:  .  metFORMIN (GLUCOPHAGE) 500 MG tablet, Take 500 mg by mouth 2 (two) times daily. , Disp: , Rfl:  .  Multiple Vitamin (MULTIVITAMIN) tablet, Take 1 tablet by mouth daily., Disp: , Rfl:  .  ondansetron (ZOFRAN) 4 MG tablet, Take 1 tablet (4 mg total) by mouth every 8 (eight) hours as needed for nausea or vomiting., Disp: 20 tablet, Rfl: 0 .  ciprofloxacin (CIPRO) 500 MG tablet, Take 1 tablet (500 mg total) by mouth 2 (two) times daily for 5 days., Disp: 10  tablet, Rfl: 0  Past Medical Problems: Past Medical History:  Diagnosis Date  . Basal cell carcinoma    basal cell -skin cancer s/p removal  . CAD S/P percutaneous coronary angioplasty 2012   Dr. Saralyn Pilar ([place], [place]): DES PCI to LAD  . DM (diabetes mellitus), type 2 with complications (HCC)    CAD  . GERD (gastroesophageal reflux disease)   . Hypercholesterolemia   . Hyperlipidemia associated with type 2 diabetes mellitus ([place])    On lovastatin    Past Surgical History: Past Surgical History:  Procedure Laterality Date  . BREAST BIOPSY Left 03/11/2019   stereo biopsy/ x clip/path pending  . BREAST RECONSTRUCTION WITH PLACEMENT OF TISSUE EXPANDER AND FLEX HD (ACELLULAR HYDRATED DERMIS) Left 05/03/2019   Procedure: LEFT BREAST RECONSTRUCTION WITH PLACEMENT OF TISSUE EXPANDER AND FLEX HD (ACELLULAR HYDRATED DERMIS);  Surgeon: Wallace Going, DO;  Location: [facility] ORS;  Service: Plastics;  Laterality: Left;  . CARDIAC CATHETERIZATION    . CHOLECYSTECTOMY    . COLONOSCOPY    . CORONARY STENT INTERVENTION  2012   Dr. Saralyn Pilar ([place]): DES PCI pLAD  . NM GATED MYOVIEW (ARMX HX)  05/09/2017   [facility]) exercised 8:36 min with no chest pain or dyspnea.  No EKG changes.  Normal EF 66%.  No ischemia or infarction)  . TOTAL MASTECTOMY Left 05/03/2019   Procedure: TOTAL MASTECTOMY W/ Sentinel Node;  Surgeon: Herbert Pun, MD;  Location: [facility] ORS;  Service: General;  Laterality: Left;    Social History: Social History   Socioeconomic History  . Marital status: Married    Spouse name: Not on file  . Number of children: Not on file  . Years of education: Not on file  . Highest education level: Not on file  Occupational History  . Occupation: Printmaker: [employer]  Tobacco Use  . Smoking status: Never Smoker  . Smokeless tobacco: Never Used  Substance and Sexual Activity  . Alcohol use: Not  Currently  . Drug use: Never  . Sexual activity: Not on file  Other Topics Concern  . Not on file  Social History Narrative   reports that she has never smoked. She has never used smokeless tobacco. She reports that she rarely drinks alcohol. She reports that she does not use drugs.      Married to [name] ([name]) [name]    Social Determinants of Health   Financial Resource Strain:   . Difficulty of Paying Living Expenses:   Food Insecurity:   . Worried About Charity fundraiser in the Last Year:   . [place] in the Last  Year:   Transportation Needs:   . Film/video editor (Medical):   Marland Kitchen Lack of Transportation (Non-Medical):   Physical Activity:   . Days of Exercise per Week:   . Minutes of Exercise per Session:   Stress:   . Feeling of Stress :   Social Connections:   . Frequency of Communication with Friends and Family:   . Frequency of Social Gatherings with Friends and Family:   . Attends Religious Services:   . Active Member of Clubs or Organizations:   . Attends Archivist Meetings:   Marland Kitchen Marital Status:   Intimate Partner Violence:   . Fear of Current or Ex-Partner:   . Emotionally Abused:   Marland Kitchen Physically Abused:   . Sexually Abused:     Family History: Family History  Problem Relation Age of Onset  . CAD Mother   . Diabetes Mellitus II Mother   . Hypertension Mother   . Uterine cancer Mother   . CAD Father   . Heart attack Father   . Hypertension Father   . Diabetes Mellitus II Father        Took insulin  . Alcohol abuse Father   . Diabetes Mellitus II Sister   . Diabetes Other        Almost all sisters are diabetic or pre-diabetic   . Breast cancer Neg Hx     Review of Systems: Review of Systems  Constitutional: Negative.   Respiratory: Negative.   Cardiovascular: Negative.   Gastrointestinal: Negative.   Genitourinary: Negative.   Musculoskeletal: Negative.   Neurological: Negative.     Physical Exam: Vital Signs BP  120/67 (BP Location: Left Arm, Patient Position: Sitting, Cuff Size: Large)   Pulse 81   Temp (!) 97.5 F (36.4 C) (Temporal)   Ht '5\' 8"'  (1.727 m)   Wt 183 lb (83 kg)   SpO2 100%   BMI 27.83 kg/m  Physical Exam Exam conducted with a chaperone present.  Constitutional:      General: She is not in acute distress.    Appearance: Normal appearance. She is not ill-appearing.  HENT:     Head: Normocephalic and atraumatic.  Eyes:     Pupils: Pupils are equal, round Neck:     Musculoskeletal: Normal range of motion.  Cardiovascular:     Rate and Rhythm: Normal rate and regular rhythm.     Pulses: Normal pulses.     Heart sounds: Normal heart sounds. No murmur.  Pulmonary:     Effort: Pulmonary effort is normal. No respiratory distress.     Breath sounds: Normal breath sounds. No wheezing.  Abdominal:     General: Abdomen is flat. There is no distension.     Palpations: Abdomen is soft.     Tenderness: There is no abdominal tenderness.  Breast: Left breast s/p mastectomy with expander in place - well healed incisions. Normal right breast. Musculoskeletal: Normal range of motion.  Skin:    General: Skin is warm and dry.     Findings: No erythema or rash.  Neurological:     General: No focal deficit present.     Mental Status: She is alert and oriented to person, place, and time. Mental status is at baseline.     Motor: No weakness.  Psychiatric:        Mood and Affect: Mood normal.        Behavior: Behavior normal.    Assessment/Plan: The patient is scheduled for removal of left  tissue expander and placement of left breast silicone implant, right breast mastopexy for symmetry scheduled for 07/01/2019 with Dr. Marla Roe Risks, benefits, and alternatives of procedure discussed, questions answered and consent obtained.   Patient provided with general and reconstructive consent form covering surgical risks associated with surgical intervention. Patient was allocated adequate time  prior to appointment to read through, initial and sign that they agree that they are aware of the risks associated. We also covered the risks in person during this pre-op appointment. I answered all of the patient's questions to their content and informed them to call with any further questions or concerns prior to surgery and I would be happy to discuss with them further.  Mammogram: Left breast mastectomy, mammogram on 02/16/2019 showing left breast cancer.  No mention of any right sided abnormalities. Smoking status: Non-smoker Caprini score: 8, high risk, risk factors include 69 year old female, BMI greater than 25, history of cancer, HRT surgery length greater than 45 minutes.  Recommend mechanical prophylaxis intraoperatively, early ambulation and possible pharmacological prophylaxis postoperatively.  Medical clearance form sent to patient's cardiology office in [facility] by medical staff. Dr. Saralyn Pilar placed cardiac stent in patient's LAD in 2012 per pt.  She is currently taking Plavix daily.  She previously held Plavix for 5 days, we will plan to hold for this time and restart 24 hours post surgery.  Prescription for Cipro x5 days sent to pharmacy.  Patient reports that she still has Norco and Zofran from previous surgery and that she does not need additional prescription.  I recommended she check the expiration date on the medications and if they are expired to call and I can resend.   The risks that can be encountered with and after placement of a breast implant were discussed and include the following but not limited to these: bleeding, infection, delayed healing, anesthesia risks, skin sensation changes, injury to structures including nerves, blood vessels, and muscles which may be temporary or permanent, allergies to tape, suture materials and glues, blood products, topical preparations or injected agents, skin contour irregularities, skin discoloration and swelling, deep vein  thrombosis, cardiac and pulmonary complications, pain, which may persist, fluid accumulation, wrinkling of the skin over the implanmt, changes in nipple or breast sensation, implant leakage or rupture, faulty position of the implant, persistent pain, formation of tight scar tissue around the implant (capsular contracture).     Electronically signed by: Carola Rhine Jazzalyn Loewenstein, PA-C 06/22/2019 2:37 PM

## 2019-06-22 NOTE — H&P (View-Only) (Signed)
Patient ID: Tammy Elliott, female    DOB: 1950-03-18, 69 y.o.   MRN: [id number]  Chief Complaint  Patient presents with  . Pre-op Exam    H&P SX: 07/01/19 removal of left breast expander with placement of implant and right mastopexy      ICD-10-CM   1. S/P mastectomy, left  Z90.12   2. Malignant neoplasm of upper-outer quadrant of left breast in female, estrogen receptor positive ([place])  C50.412    Z17.0      History of Present Illness: Tammy Elliott is a 69 y.o.  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  She underwent left mastectomy by Dr. Windell Moment and left breast reconstruction with placement of tissue expander and Flex HD on 05/03/2019 by Dr. Marla Roe.  She presents for preoperative evaluation for upcoming procedure, removal of left tissue expander and placement of left breast implant, right breast mastopexy for symmetry, scheduled for 07/01/2019 with Dr. Marla Roe.  The patient has not had problems with anesthesia.  No personal or family history of DVT/PE.  No family or personal history of bleeding or clotting disorders.  Patient is a non-smoker.    Past medical history of type 2 diabetes mellitus, coronary artery disease status post stent in LAD 2012.  History of hyperlipidemia.  Patient reports that she has been feeling great lately, she has been doing a lot of yard work and is very active.  Most recent A1c 7.2, patient reports she had splurged over the holidays and A1c is typically lower.  She reports that PT has been very helpful and she feels like she has great range of motion of her left arm.  She is currently on Plavix, prescribed by her cardiologist who placed her stent.  Prior to previous surgery she did have her Plavix held for 5 days.  Patient reports that she is unsure if she would like an implant on the right to match  the left pending any size discrepancy.  She reports that she is fine going a little bit smaller with the left reconstructed side to match the natural right breast or placing an implant on the right to match the left.  She does not have a preference and would be pleased with either option as long as she is fairly symmetric.   Past Medical History: Allergies: Allergies  Allergen Reactions  . Exenatide Nausea Only  . Orlistat     Other reaction(s): Unknown  . Penicillins Hives and Swelling  . Thiazide-Type Diuretics     Other reaction(s): Unknown    Current Medications:  Current Outpatient Medications:  .  anastrozole (ARIMIDEX) 1 MG tablet, Take 1 tablet (1 mg total) by mouth daily., Disp: 30 tablet, Rfl: 2 .  CALCIUM-MAGNESIUM-ZINC PO, Take 1 tablet by mouth every evening. , Disp: , Rfl:  .  clopidogrel (PLAVIX) 75 MG tablet, Take 75 mg by mouth daily. , Disp: , Rfl:  .  diazepam (VALIUM) 2 MG tablet, Take 1 tablet (2 mg total) by mouth every 12 (twelve) hours as needed for muscle spasms., Disp: 20 tablet, Rfl: 0 .  glimepiride (AMARYL) 4 MG tablet, Take 4 mg by mouth 2 (two) times daily. , Disp: , Rfl:  .  JANUMET 50-500 MG tablet, Take 1 tablet by mouth 2 (two) times daily., Disp: , Rfl:  .  JARDIANCE 25 MG TABS tablet,  Take 25 mg by mouth daily. , Disp: , Rfl:  .  lansoprazole (PREVACID) 15 MG capsule, Take 15 mg by mouth daily. , Disp: , Rfl:  .  lovastatin (MEVACOR) 40 MG tablet, Take 40 mg by mouth at bedtime. , Disp: , Rfl:  .  metFORMIN (GLUCOPHAGE) 500 MG tablet, Take 500 mg by mouth 2 (two) times daily. , Disp: , Rfl:  .  Multiple Vitamin (MULTIVITAMIN) tablet, Take 1 tablet by mouth daily., Disp: , Rfl:  .  ondansetron (ZOFRAN) 4 MG tablet, Take 1 tablet (4 mg total) by mouth every 8 (eight) hours as needed for nausea or vomiting., Disp: 20 tablet, Rfl: 0 .  ciprofloxacin (CIPRO) 500 MG tablet, Take 1 tablet (500 mg total) by mouth 2 (two) times daily for 5 days., Disp: 10  tablet, Rfl: 0  Past Medical Problems: Past Medical History:  Diagnosis Date  . Basal cell carcinoma    basal cell -skin cancer s/p removal  . CAD S/P percutaneous coronary angioplasty 2012   Dr. Saralyn Pilar ([place], [place]): DES PCI to LAD  . DM (diabetes mellitus), type 2 with complications ([facility]CC)    CAD  . GERD (gastroesophageal reflux disease)   . Hypercholesterolemia   . Hyperlipidemia associated with type 2 diabetes mellitus ([place])    On lovastatin    Past Surgical History: Past Surgical History:  Procedure Laterality Date  . BREAST BIOPSY Left 03/11/2019   stereo biopsy/ x clip/path pending  . BREAST RECONSTRUCTION WITH PLACEMENT OF TISSUE EXPANDER AND FLEX HD (ACELLULAR HYDRATED DERMIS) Left 05/03/2019   Procedure: LEFT BREAST RECONSTRUCTION WITH PLACEMENT OF TISSUE EXPANDER AND FLEX HD (ACELLULAR HYDRATED DERMIS);  Surgeon: Wallace Going, DO;  Location: [facility] ORS;  Service: Plastics;  Laterality: Left;  . CARDIAC CATHETERIZATION    . CHOLECYSTECTOMY    . COLONOSCOPY    . CORONARY STENT INTERVENTION  2012   Dr. Saralyn Pilar ([place]): DES PCI pLAD  . NM GATED MYOVIEW (ARMX HX)  05/09/2017   [facility]) exercised 8:36 min with no chest pain or dyspnea.  No EKG changes.  Normal EF 66%.  No ischemia or infarction)  . TOTAL MASTECTOMY Left 05/03/2019   Procedure: TOTAL MASTECTOMY W/ Sentinel Node;  Surgeon: Herbert Pun, MD;  Location: [facility] ORS;  Service: General;  Laterality: Left;    Social History: Social History   Socioeconomic History  . Marital status: Married    Spouse name: Not on file  . Number of children: Not on file  . Years of education: Not on file  . Highest education level: Not on file  Occupational History  . Occupation: Printmaker: [employer]  Tobacco Use  . Smoking status: Never Smoker  . Smokeless tobacco: Never Used  Substance and Sexual Activity  . Alcohol use: Not  Currently  . Drug use: Never  . Sexual activity: Not on file  Other Topics Concern  . Not on file  Social History Narrative   reports that she has never smoked. She has never used smokeless tobacco. She reports that she rarely drinks alcohol. She reports that she does not use drugs.      Married to [name] ([name]) [name]    Social Determinants of Health   Financial Resource Strain:   . Difficulty of Paying Living Expenses:   Food Insecurity:   . Worried About Charity fundraiser in the Last Year:   . [place] in the Last  Year:   Transportation Needs:   . Film/video editor (Medical):   Marland Kitchen Lack of Transportation (Non-Medical):   Physical Activity:   . Days of Exercise per Week:   . Minutes of Exercise per Session:   Stress:   . Feeling of Stress :   Social Connections:   . Frequency of Communication with Friends and Family:   . Frequency of Social Gatherings with Friends and Family:   . Attends Religious Services:   . Active Member of Clubs or Organizations:   . Attends Archivist Meetings:   Marland Kitchen Marital Status:   Intimate Partner Violence:   . Fear of Current or Ex-Partner:   . Emotionally Abused:   Marland Kitchen Physically Abused:   . Sexually Abused:     Family History: Family History  Problem Relation Age of Onset  . CAD Mother   . Diabetes Mellitus II Mother   . Hypertension Mother   . Uterine cancer Mother   . CAD Father   . Heart attack Father   . Hypertension Father   . Diabetes Mellitus II Father        Took insulin  . Alcohol abuse Father   . Diabetes Mellitus II Sister   . Diabetes Other        Almost all sisters are diabetic or pre-diabetic   . Breast cancer Neg Hx     Review of Systems: Review of Systems  Constitutional: Negative.   Respiratory: Negative.   Cardiovascular: Negative.   Gastrointestinal: Negative.   Genitourinary: Negative.   Musculoskeletal: Negative.   Neurological: Negative.     Physical Exam: Vital Signs BP  120/67 (BP Location: Left Arm, Patient Position: Sitting, Cuff Size: Large)   Pulse 81   Temp (!) 97.5 F (36.4 C) (Temporal)   Ht '5\' 8"'  (1.727 m)   Wt 183 lb (83 kg)   SpO2 100%   BMI 27.83 kg/m  Physical Exam Exam conducted with a chaperone present.  Constitutional:      General: She is not in acute distress.    Appearance: Normal appearance. She is not ill-appearing.  HENT:     Head: Normocephalic and atraumatic.  Eyes:     Pupils: Pupils are equal, round Neck:     Musculoskeletal: Normal range of motion.  Cardiovascular:     Rate and Rhythm: Normal rate and regular rhythm.     Pulses: Normal pulses.     Heart sounds: Normal heart sounds. No murmur.  Pulmonary:     Effort: Pulmonary effort is normal. No respiratory distress.     Breath sounds: Normal breath sounds. No wheezing.  Abdominal:     General: Abdomen is flat. There is no distension.     Palpations: Abdomen is soft.     Tenderness: There is no abdominal tenderness.  Breast: Left breast s/p mastectomy with expander in place - well healed incisions. Normal right breast. Musculoskeletal: Normal range of motion.  Skin:    General: Skin is warm and dry.     Findings: No erythema or rash.  Neurological:     General: No focal deficit present.     Mental Status: She is alert and oriented to person, place, and time. Mental status is at baseline.     Motor: No weakness.  Psychiatric:        Mood and Affect: Mood normal.        Behavior: Behavior normal.    Assessment/Plan: The patient is scheduled for removal of left  tissue expander and placement of left breast silicone implant, right breast mastopexy for symmetry scheduled for 07/01/2019 with Dr. Marla Roe Risks, benefits, and alternatives of procedure discussed, questions answered and consent obtained.   Patient provided with general and reconstructive consent form covering surgical risks associated with surgical intervention. Patient was allocated adequate time  prior to appointment to read through, initial and sign that they agree that they are aware of the risks associated. We also covered the risks in person during this pre-op appointment. I answered all of the patient's questions to their content and informed them to call with any further questions or concerns prior to surgery and I would be happy to discuss with them further.  Mammogram: Left breast mastectomy, mammogram on 02/16/2019 showing left breast cancer.  No mention of any right sided abnormalities. Smoking status: Non-smoker Caprini score: 8, high risk, risk factors include 69 year old female, BMI greater than 25, history of cancer, HRT surgery length greater than 45 minutes.  Recommend mechanical prophylaxis intraoperatively, early ambulation and possible pharmacological prophylaxis postoperatively.  Medical clearance form sent to patient's cardiology office in [facility] by medical staff. Dr. Saralyn Pilar placed cardiac stent in patient's LAD in 2012 per pt.  She is currently taking Plavix daily.  She previously held Plavix for 5 days, we will plan to hold for this time and restart 24 hours post surgery.  Prescription for Cipro x5 days sent to pharmacy.  Patient reports that she still has Norco and Zofran from previous surgery and that she does not need additional prescription.  I recommended she check the expiration date on the medications and if they are expired to call and I can resend.   The risks that can be encountered with and after placement of a breast implant were discussed and include the following but not limited to these: bleeding, infection, delayed healing, anesthesia risks, skin sensation changes, injury to structures including nerves, blood vessels, and muscles which may be temporary or permanent, allergies to tape, suture materials and glues, blood products, topical preparations or injected agents, skin contour irregularities, skin discoloration and swelling, deep vein  thrombosis, cardiac and pulmonary complications, pain, which may persist, fluid accumulation, wrinkling of the skin over the implanmt, changes in nipple or breast sensation, implant leakage or rupture, faulty position of the implant, persistent pain, formation of tight scar tissue around the implant (capsular contracture).     Electronically signed by: Carola Rhine Amneet Cendejas, PA-C 06/22/2019 2:37 PM

## 2019-06-22 NOTE — Therapy (Signed)
[place] [facility] [address], [place], [address] Phone: [phone]   Fax:  [phone]  Physical Therapy Treatment  Patient Details  Name: Tammy Elliott MRN: [id number] Date of Birth: [birth year] Referring Provider (PT): Scheeler MD   Encounter Date: 06/22/2019  PT End of Session - 06/22/19 1722    Visit Number  3    Number of Visits  13    Date for PT Re-Evaluation  07/21/19    Authorization Type  3/10 Medicare    PT Start Time  1700    PT Stop Time  [id number]    PT Time Calculation (min)  45 min    Activity Tolerance  Patient tolerated treatment well    Behavior During Therapy  [facility] for tasks assessed/performed       Past Medical History:  Diagnosis Date  . Basal cell carcinoma    basal cell -skin cancer s/p removal  . CAD S/P percutaneous coronary angioplasty 2012   Dr. Saralyn Pilar ([place], [place]): DES PCI to LAD  . DM (diabetes mellitus), type 2 with complications (HCC)    CAD  . GERD (gastroesophageal reflux disease)   . Hypercholesterolemia   . Hyperlipidemia associated with type 2 diabetes mellitus ([place])    On lovastatin    Past Surgical History:  Procedure Laterality Date  . BREAST BIOPSY Left 03/11/2019   stereo biopsy/ x clip/path pending  . BREAST RECONSTRUCTION WITH PLACEMENT OF TISSUE EXPANDER AND FLEX HD (ACELLULAR HYDRATED DERMIS) Left 05/03/2019   Procedure: LEFT BREAST RECONSTRUCTION WITH PLACEMENT OF TISSUE EXPANDER AND FLEX HD (ACELLULAR HYDRATED DERMIS);  Surgeon: Wallace Going, DO;  Location: [facility] ORS;  Service: Plastics;  Laterality: Left;  . CARDIAC CATHETERIZATION    . CHOLECYSTECTOMY    . COLONOSCOPY    . CORONARY STENT INTERVENTION  2012   Dr. Saralyn Pilar ([place]): DES PCI pLAD  . NM GATED MYOVIEW (ARMX HX)  05/09/2017   [facility]) exercised 8:36 min with no chest pain or dyspnea.  No EKG changes.  Normal EF 66%.  No  ischemia or infarction)  . TOTAL MASTECTOMY Left 05/03/2019   Procedure: TOTAL MASTECTOMY W/ Sentinel Node;  Surgeon: Herbert Pun, MD;  Location: [facility] ORS;  Service: General;  Laterality: Left;    There were no vitals filed for this visit.  Subjective Assessment - 06/22/19 1704    Subjective  Patient reports no pain currently and states the shoulder has been easier to move. Patient states she's been performing her exercises and states minimal difficulties.    Pertinent History  Hx of CA breast 03/12/2019; massectomy 05/03/2019, DMII    Limitations  Lifting    Diagnostic tests  [place], mammogram + breast CA    Patient Stated Goals  Regain use of L Shoulder    Currently in Pain?  No/denies    Pain Onset  More than a month ago          TREATMENT Therapeutic Exercise Shoulder flexion with use of small ball - 2 x 10; 2 x 10  Doorway stretch with rotation for pec stretching - 2 x 10  Shoulder ER with patient in sitting with RTB - 2 x 10  Pulleys in sitting flexion - 2 x 10 ; abduction with shoulder -2 x 10  Push up PLUS on the wall - x 10; against table - x 10  Shoulder extension in standing with RTB - 2 x 10  Lat Pull down in sitting with 35# at [facility] - 2 x 10 Shoulder rows in standing at [place] - 2 x 10 15# Shoulder flexion in sitting with tactile response - 3 x 4    Patient performed exercises to improve strength  PT Education - 06/22/19 1720    Education Details  form/technique with exercise; doorway stretch,    Person(s) Educated  Patient    Methods  Explanation;Demonstration    Comprehension  Returned demonstration;Verbalized understanding       PT Short Term Goals - 06/09/19 1832      PT SHORT TERM GOAL #1   Title  Patient will be independent with HEP to continue benefits of therapy after discharge    Baseline  dependnent with form and progressions    Time  6    Period  Weeks    Status  New    Target Date  07/21/19        PT Long Term Goals - 06/09/19 1833       PT LONG TERM GOAL #1   Title  Patient will have all AROM equal to the unaffected side in order to regain full use of L UE especially for reaching overhead    Baseline  Shoulder flexion: 100; abduction; 95    Time  6    Period  Weeks    Status  New    Target Date  07/21/19      PT LONG TERM GOAL #2   Title  Patient will be able to return to a full gym program with high confidence and independence to return to recreation activities and improve UE fitness.    Baseline  Unable to return secondary to pain and limitations    Time  6    Period  Weeks    Status  New    Target Date  07/21/19      PT LONG TERM GOAL #3   Title  Patient will have a significant improvement in her FOTO score to indicate improvement in functional use of her affected UE and be able to carry items without increase in pain    Time  6    Period  Weeks    Status  New    Target Date  07/21/19            Plan - 06/22/19 1724    Clinical Impression Statement  Patient demonstrates improvement with perform shoulder movement with greater level of shoulder flexion and abduction. Progressed resistance levels and performing of higher level exercises. Patient tolareates this well and will benefit from furhter skilled therapy to return to prior level of function.    Personal Factors and Comorbidities  Age;Comorbidity 3+    Comorbidities  CA dx, massectomy, DMII    Examination-Activity Limitations  Carry;Reach Overhead;Bathing;Hygiene/Grooming    Examination-Participation Restrictions  Meal Prep    Stability/Clinical Decision Making  Stable/Uncomplicated    Rehab Potential  Good    PT Frequency  2x / week    PT Duration  6 weeks    PT Treatment/Interventions  Electrical Stimulation;Iontophoresis 4mg /ml Dexamethasone;Cryotherapy;Ultrasound;Moist Heat;Therapeutic exercise;Manual techniques;Patient/family education;Passive range of motion;Dry needling;Scar mobilization;Joint Manipulations    PT Next Visit Plan  progress  strengthening and improvement of AROM    PT Home Exercise Plan  See education section       Patient will benefit from skilled therapeutic intervention in order to improve the following deficits and impairments:  Increased fascial restricitons, Pain, Decreased coordination, Decreased mobility, Increased muscle  spasms, Postural dysfunction, Decreased range of motion, Decreased endurance, Decreased strength, Hypomobility  Visit Diagnosis: Stiffness of left shoulder, not elsewhere classified  Acute pain of left shoulder     Problem List Patient Active Problem List   Diagnosis Date Noted  . S/P mastectomy, left 05/11/2019  . Breast cancer ([place]) 05/03/2019  . Malignant neoplasm of upper-outer quadrant of left breast in female, estrogen receptor positive ([place]) 03/23/2019  . CAD S/P percutaneous coronary angioplasty 05/04/2018    Blythe Stanford, PT DPT 06/22/2019, 5:42 PM  [place] [facility] [address], [place], [address] Phone: [phone]   Fax:  [phone]  Name: NUSRAT LINE MRN: [id number] Date of Birth: 09/17/1950

## 2019-06-23 NOTE — Telephone Encounter (Signed)
Faxed surgical clearance to Dr. Lorinda Creed for plavix

## 2019-06-24 ENCOUNTER — Ambulatory Visit: Payer: 59

## 2019-06-24 ENCOUNTER — Telehealth: Payer: Self-pay

## 2019-06-24 NOTE — Telephone Encounter (Signed)
Notified Shayna, Dr. Marla Roe and [facility] surgical clearance has been received and cleared. She is to stop her Plavix 5 days prior to surgery. Copy scanned to chart.

## 2019-06-24 NOTE — Telephone Encounter (Signed)
Called patient regarding her surgery. LMVM to call back. She has been cleared for surgery from her cardiologist  as well as ok'd to stop Plavix 5 days prior to surgery

## 2019-06-25 ENCOUNTER — Telehealth: Payer: Self-pay

## 2019-06-25 NOTE — Telephone Encounter (Signed)
Called patient, LMVM to stop Plavix 5 days prior to surgery!!

## 2019-06-28 ENCOUNTER — Other Ambulatory Visit (HOSPITAL_COMMUNITY)
Admission: RE | Admit: 2019-06-28 | Discharge: 2019-06-28 | Disposition: A | Discharge status: Home / Self Care | Payer: 59 | Source: Ambulatory Visit | Attending: Plastic Surgery | Admitting: Plastic Surgery

## 2019-06-28 ENCOUNTER — Encounter (HOSPITAL_BASED_OUTPATIENT_CLINIC_OR_DEPARTMENT_OTHER)
Admission: RE | Admit: 2019-06-28 | Discharge: 2019-06-28 | Disposition: A | Discharge status: Home / Self Care | Payer: 59 | Source: Ambulatory Visit | Attending: Plastic Surgery | Admitting: Plastic Surgery

## 2019-06-28 DIAGNOSIS — Z20822 Contact with and (suspected) exposure to covid-19: Secondary | ICD-10-CM | POA: Diagnosis not present

## 2019-06-28 DIAGNOSIS — Z01812 Encounter for preprocedural laboratory examination: Secondary | ICD-10-CM | POA: Insufficient documentation

## 2019-06-28 LAB — BASIC METABOLIC PANEL
Anion gap: 15 (ref 5–15)
BUN: 15 mg/dL (ref 8–23)
CO2: 22 mmol/L (ref 22–32)
Calcium: 9.5 mg/dL (ref 8.9–10.3)
Chloride: 103 mmol/L (ref 98–111)
Creatinine, Ser: 0.84 mg/dL (ref 0.44–1.00)
GFR calc Af Amer: >60 mL/min (ref >60)
GFR calc non Af Amer: >60 mL/min (ref >60)
Glucose, Bld: 178 mg/dL — ABNORMAL HIGH (ref 70–99)
Potassium: 4.3 mmol/L (ref 3.5–5.1)
Sodium: 140 mmol/L (ref 135–145)

## 2019-06-28 LAB — SARS CORONAVIRUS 2 (TAT 6-24 HRS): SARS Coronavirus 2: NEGATIVE

## 2019-06-29 ENCOUNTER — Inpatient Hospital Stay: Payer: 59 | Attending: Oncology

## 2019-06-29 ENCOUNTER — Other Ambulatory Visit: Payer: Self-pay

## 2019-06-29 ENCOUNTER — Encounter: Payer: Self-pay | Admitting: Oncology

## 2019-06-29 ENCOUNTER — Inpatient Hospital Stay (HOSPITAL_BASED_OUTPATIENT_CLINIC_OR_DEPARTMENT_OTHER): Payer: 59 | Admitting: Oncology

## 2019-06-29 VITALS — BP 132/72 | HR 75 | Temp 96.4°F | Resp 18 | Wt 182.3 lb

## 2019-06-29 DIAGNOSIS — Z79811 Long term (current) use of aromatase inhibitors: Secondary | ICD-10-CM

## 2019-06-29 DIAGNOSIS — Z170 Estrogen receptor positive status [ER+]: Secondary | ICD-10-CM

## 2019-06-29 DIAGNOSIS — Z808 Family history of malignant neoplasm of other organs or systems: Secondary | ICD-10-CM | POA: Diagnosis not present

## 2019-06-29 DIAGNOSIS — C50812 Malignant neoplasm of overlapping sites of left female breast: Secondary | ICD-10-CM | POA: Insufficient documentation

## 2019-06-29 LAB — COMPREHENSIVE METABOLIC PANEL
ALT: 29 U/L (ref 0–44)
AST: 30 U/L (ref 15–41)
Albumin: 4.3 g/dL (ref 3.5–5.0)
Alkaline Phosphatase: 61 U/L (ref 38–126)
Anion gap: 12 (ref 5–15)
BUN: 16 mg/dL (ref 8–23)
CO2: 25 mmol/L (ref 22–32)
Calcium: 9.5 mg/dL (ref 8.9–10.3)
Chloride: 101 mmol/L (ref 98–111)
Creatinine, Ser: 0.84 mg/dL (ref 0.44–1.00)
GFR calc Af Amer: >60 mL/min (ref >60)
GFR calc non Af Amer: >60 mL/min (ref >60)
Glucose, Bld: 171 mg/dL — ABNORMAL HIGH (ref 70–99)
Potassium: 4.4 mmol/L (ref 3.5–5.1)
Sodium: 138 mmol/L (ref 135–145)
Total Bilirubin: 0.5 mg/dL (ref 0.3–1.2)
Total Protein: 7.3 g/dL (ref 6.5–8.1)

## 2019-06-29 LAB — CBC WITH DIFFERENTIAL/PLATELET
Abs Immature Granulocytes: 0.01 10*3/uL (ref 0.00–0.07)
Basophils Absolute: 0.1 10*3/uL (ref 0.0–0.1)
Basophils Relative: 1 %
Eosinophils Absolute: 0.1 10*3/uL (ref 0.0–0.5)
Eosinophils Relative: 2 %
HCT: 43 % (ref 36.0–46.0)
Hemoglobin: 14.1 g/dL (ref 12.0–15.0)
Immature Granulocytes: 0 %
Lymphocytes Relative: 29 %
Lymphs Abs: 1.6 10*3/uL (ref 0.7–4.0)
MCH: 30.6 pg (ref 26.0–34.0)
MCHC: 32.8 g/dL (ref 30.0–36.0)
MCV: 93.3 fL (ref 80.0–100.0)
Monocytes Absolute: 0.5 10*3/uL (ref 0.1–1.0)
Monocytes Relative: 9 %
Neutro Abs: 3.1 10*3/uL (ref 1.7–7.7)
Neutrophils Relative %: 59 %
Platelets: 246 10*3/uL (ref 150–400)
RBC: 4.61 MIL/uL (ref 3.87–5.11)
RDW: 12.2 % (ref 11.5–15.5)
WBC: 5.4 10*3/uL (ref 4.0–10.5)
nRBC: 0 % (ref 0.0–0.2)

## 2019-06-29 NOTE — Progress Notes (Signed)
Patient here for follow up. Pt concerned that Arimidex will give her a blood clot and she will die. No side effects from medication. No new breast concerns. Pt will have breast reconstruction on Thursday (4/22).

## 2019-06-29 NOTE — Progress Notes (Signed)
Hematology/Oncology follow up note [facility] Telephone:([phone] [phone]   Patient Care Team: Baxter Hire, MD as PCP - General (Internal Medicine) Leonie Man, MD as PCP - Cardiology (Cardiology) Rico Junker, RN as Registered Nurse Theodore Demark, RN as Registered Nurse  REFERRING PROVIDER: Baxter Hire, MD  CHIEF COMPLAINTS/REASON FOR VISIT:  Follow up of breast cancer  HISTORY OF PRESENTING ILLNESS:   Tammy Elliott is a  69 y.o.  female with PMH listed below was seen in consultation at the request of  Baxter Hire, MD  for evaluation of breast cancer Patient had screening mammogram done on 02/16/2019 which showed left breast asymmetry with distortion in the upper outer quadrant. 03/03/2019 left diagnostic mammogram showed small asymmetry noted on the screening study appears to be a small ill-defined mass with associated subtle distortion.  1:30 position of the left breast.  7 cm from the nipple. Sonographic evaluation of the left axillary showed no enlarged or abnormal lymph nodes. Patient underwent stereotactic core needle biopsy of the mass. 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  Grade 2, ER > 90%, PR 1 to 10%, HER-2 negative. Patient denies any family history of breast cancer. Menarche 69 years old, menopause 69 years old Remote use of OCP for about 10 to 15 years, Never used hormone replacement therapy. Denies any history of radiation to the chest.  03/13/2019 Breast MRI bilateral showed numerous suspicious enhancing masses through out the upper inner and upper outer left breast concerning for multicentric diseases. Suspicious findings span greater than 6cm in both AP and transverse dimensions.  MRI biopsy of left breast central middle and central anterior mass showed invasive mammary carcinoma, grade 1/2.  # 05/03/2019 patient  underwent left total mastectomy, and SLNB.  Pathology showed invasive lobular carcinoma, multifocal, LCIS, 1 lymph node negative for malignancy.  Stage IA, pT2(m) pN0 (sn), Grade 2, Margins are not involved by invasive carcinoma.  ER 95%, PR 30%, HER2 IHC 2+ equivocal, FISH negative.   OncotypeDX recurrence score is 19, patient is postmenopausal, chemotherapy benefit is <1%, I will not offer adjuvant chemotherapy.    INTERVAL HISTORY Tammy Elliott is a 69 y.o. female who has above history reviewed by me today presents for follow up visit for management of breast cancer. Problems and complaints are listed below: She has started Arimidex 65m daily and tolerates well. Mild facial flush.  She will have breast reconstruction this week.   Review of Systems  Constitutional: Negative for appetite change, chills, fatigue and fever.  HENT:   Negative for hearing loss and voice change.   Eyes: Negative for eye problems.  Respiratory: Negative for chest tightness and cough.   Cardiovascular: Negative for chest pain.  Gastrointestinal: Negative for abdominal distention, abdominal pain and blood in stool.  Endocrine: Negative for hot flashes.  Genitourinary: Negative for difficulty urinating and frequency.   Musculoskeletal: Negative for arthralgias.  Skin: Negative for itching and rash.  Neurological: Negative for extremity weakness.  Hematological: Negative for adenopathy.  Psychiatric/Behavioral: Negative for confusion.    MEDICAL HISTORY:  Past Medical History:  Diagnosis Date  . Basal cell carcinoma    basal cell -skin cancer s/p removal  . CAD S/P percutaneous coronary angioplasty 2012   [place] PCI to LAD  . DM (diabetes mellitus), type 2 with complications (HCC)    CAD  .  GERD (gastroesophageal reflux disease)   . Hypercholesterolemia   . Hyperlipidemia associated with type 2 diabetes mellitus ([place])    On lovastatin    SURGICAL  HISTORY: Past Surgical History:  Procedure Laterality Date  . BREAST BIOPSY Left 03/11/2019   stereo biopsy/ x clip/path pending  . BREAST RECONSTRUCTION WITH PLACEMENT OF TISSUE EXPANDER AND FLEX HD (ACELLULAR HYDRATED DERMIS) Left 05/03/2019   Procedure: LEFT BREAST RECONSTRUCTION WITH PLACEMENT OF TISSUE EXPANDER AND FLEX HD (ACELLULAR HYDRATED DERMIS);  Surgeon: Wallace Going, DO;  Location: [facility] ORS;  Service: Plastics;  Laterality: Left;  . CARDIAC CATHETERIZATION    . CHOLECYSTECTOMY    . COLONOSCOPY    . CORONARY STENT INTERVENTION  2012   Dr. Saralyn Pilar ([place]): DES PCI pLAD  . NM GATED MYOVIEW (ARMX HX)  05/09/2017   [facility]) exercised 8:36 min with no chest pain or dyspnea.  No EKG changes.  Normal EF 66%.  No ischemia or infarction)  . TOTAL MASTECTOMY Left 05/03/2019   Procedure: TOTAL MASTECTOMY W/ Sentinel Node;  Surgeon: Herbert Pun, MD;  Location: [facility] ORS;  Service: General;  Laterality: Left;    SOCIAL HISTORY: Social History   Socioeconomic History  . Marital status: Married    Spouse name: Not on file  . Number of children: Not on file  . Years of education: Not on file  . Highest education level: Not on file  Occupational History  . Occupation: Printmaker: [employer]  Tobacco Use  . Smoking status: Never Smoker  . Smokeless tobacco: Never Used  Substance and Sexual Activity  . Alcohol use: Not Currently  . Drug use: Never  . Sexual activity: Not on file  Other Topics Concern  . Not on file  Social History Narrative   reports that she has never smoked. She has never used smokeless tobacco. She reports that she rarely drinks alcohol. She reports that she does not use drugs.      Married to [name] ([name]) [name]    Social Determinants of Health   Financial Resource Strain:   . Difficulty of Paying Living Expenses:   Food Insecurity:   . Worried About Charity fundraiser in the Last Year:     . Arboriculturist in the Last Year:   Transportation Needs:   . Film/video editor (Medical):   Marland Kitchen Lack of Transportation (Non-Medical):   Physical Activity:   . Days of Exercise per Week:   . Minutes of Exercise per Session:   Stress:   . Feeling of Stress :   Social Connections:   . Frequency of Communication with Friends and Family:   . Frequency of Social Gatherings with Friends and Family:   . Attends Religious Services:   . Active Member of Clubs or Organizations:   . Attends Archivist Meetings:   Marland Kitchen Marital Status:   Intimate Partner Violence:   . Fear of Current or Ex-Partner:   . Emotionally Abused:   Marland Kitchen Physically Abused:   . Sexually Abused:     FAMILY HISTORY: Family History  Problem Relation Age of Onset  . CAD Mother   . Diabetes Mellitus II Mother   . Hypertension Mother   . Uterine cancer Mother   . CAD Father   . Heart attack Father   . Hypertension Father   . Diabetes Mellitus II Father        Took insulin  . Alcohol abuse  Father   . Diabetes Mellitus II Sister   . Diabetes Other        Almost all sisters are diabetic or pre-diabetic   . Breast cancer Neg Hx     ALLERGIES:  is allergic to exenatide; orlistat; penicillins; and thiazide-type diuretics.  MEDICATIONS:  Current Outpatient Medications  Medication Sig Dispense Refill  . anastrozole (ARIMIDEX) 1 MG tablet Take 1 tablet (1 mg total) by mouth daily. 30 tablet 2  . CALCIUM-MAGNESIUM-ZINC PO Take 1 tablet by mouth every evening.     . clopidogrel (PLAVIX) 75 MG tablet Take 75 mg by mouth daily.     Marland Kitchen glimepiride (AMARYL) 4 MG tablet Take 4 mg by mouth 2 (two) times daily.     Marland Kitchen JANUMET 50-500 MG tablet Take 1 tablet by mouth 2 (two) times daily.    Marland Kitchen JARDIANCE 25 MG TABS tablet Take 25 mg by mouth daily.     . lansoprazole (PREVACID) 15 MG capsule Take 15 mg by mouth daily.     Marland Kitchen lovastatin (MEVACOR) 40 MG tablet Take 40 mg by mouth at bedtime.     . metFORMIN (GLUCOPHAGE)  500 MG tablet Take 500 mg by mouth 2 (two) times daily.     . Multiple Vitamin (MULTIVITAMIN) tablet Take 1 tablet by mouth daily.    . diazepam (VALIUM) 2 MG tablet Take 1 tablet (2 mg total) by mouth every 12 (twelve) hours as needed for muscle spasms. (Patient not taking: Reported on 06/29/2019) 20 tablet 0  . ondansetron (ZOFRAN) 4 MG tablet Take 1 tablet (4 mg total) by mouth every 8 (eight) hours as needed for nausea or vomiting. (Patient not taking: Reported on 06/29/2019) 20 tablet 0   No current facility-administered medications for this visit.     PHYSICAL EXAMINATION: ECOG PERFORMANCE STATUS: 0 - Asymptomatic Vitals:   06/29/19 0948  BP: 132/72  Pulse: 75  Resp: 18  Temp: (!) 96.4 F (35.8 C)   Filed Weights   06/29/19 0948  Weight: 182 lb 4.8 oz (82.7 kg)    Physical Exam Constitutional:      General: She is not in acute distress. HENT:     Head: Normocephalic and atraumatic.  Eyes:     General: No scleral icterus.    Pupils: Pupils are equal, round, and reactive to light.  Cardiovascular:     Rate and Rhythm: Normal rate and regular rhythm.     Heart sounds: Normal heart sounds.  Pulmonary:     Effort: Pulmonary effort is normal. No respiratory distress.     Breath sounds: No wheezing.  Abdominal:     General: Bowel sounds are normal. There is no distension.     Palpations: Abdomen is soft. There is no mass.     Tenderness: There is no abdominal tenderness.  Musculoskeletal:        General: No deformity. Normal range of motion.     Cervical back: Normal range of motion and neck supple.  Skin:    General: Skin is warm and dry.     Findings: No erythema or rash.  Neurological:     Mental Status: She is alert and oriented to person, place, and time. Mental status is at baseline.     Cranial Nerves: No cranial nerve deficit.     Coordination: Coordination normal.  Psychiatric:        Mood and Affect: Mood normal.    LABORATORY DATA:  I have reviewed the  data as  listed Lab Results  Component Value Date   WBC 5.4 06/29/2019   HGB 14.1 06/29/2019   HCT 43.0 06/29/2019   MCV 93.3 06/29/2019   PLT 246 06/29/2019   Recent Labs    03/23/19 1005 03/23/19 1005 05/03/19 1911 06/28/19 1027 06/29/19 0935  NA 138  --   --  140 138  K 4.4  --   --  4.3 4.4  CL 101  --   --  103 101  CO2 25  --   --  22 25  GLUCOSE 219*  --   --  178* 171*  BUN 18  --   --  15 16  CREATININE 0.97   < > 0.94 0.84 0.84  CALCIUM 9.5  --   --  9.5 9.5  GFRNONAA >60   < > >60 >60 >60  GFRAA >60   < > >60 >60 >60  PROT 7.6  --   --   --  7.3  ALBUMIN 4.2  --   --   --  4.3  AST 23  --   --   --  30  ALT 23  --   --   --  29  ALKPHOS 57  --   --   --  61  BILITOT 0.6  --   --   --  0.5   < > = values in this interval not displayed.   Iron/TIBC/Ferritin/ %Sat No results found for: IRON, TIBC, FERRITIN, IRONPCTSAT    RADIOGRAPHIC STUDIES: I have personally reviewed the radiological images as listed and agreed with the findings in the report.  DG Bone Density  Result Date: 06/01/2019 EXAM: DUAL X-RAY ABSORPTIOMETRY (DXA) FOR BONE MINERAL DENSITY IMPRESSION: Your patient Anaja Monts completed a BMD test on 06/01/2019 using the [place] (software version: 14.10) manufactured by [employer]. The following summarizes the results of our evaluation. Technologist:VLM PATIENT BIOGRAPHICAL: Name: Karema, Tocci Patient ID: [id number] Birth Date: 01-Jul-1950 Height: 67.5 in. Gender: Female Exam Date: 06/01/2019 Weight: 181.9 lbs. Indications: Caucasian, Diabetic, Height Loss, History of Breast Cancer, Postmenopausal Fractures: Treatments: Arimidex, Calcium, Vitamin D DENSITOMETRY RESULTS: Site      Region     Measured Date Measured Age WHO Classification Young Adult T-score BMD         %Change vs. Previous Significant Change (*) AP Spine L1-L4 06/01/2019 66.8 Normal -0.5 1.136 g/cm2 - - DualFemur Neck Right 06/01/2019 66.8 Normal -0.3 0.999 g/cm2 - -  ASSESSMENT: The BMD measured at AP Spine L1-L4 is 1.136 g/cm2 with a T-score of -0.5. This patient is considered NORMAL according to [facility]) criteria. The scan quality is good. World Pharmacologist [facility]) criteria for post-menopausal, Caucasian Women: Normal:                   T-score at or above -1 SD Osteopenia/low bone mass: T-score between -1 and -2.5 SD Osteoporosis:             T-score at or below -2.5 SD RECOMMENDATIONS: 1. All patients should optimize calcium and vitamin D intake. 2. Consider FDA-approved medical therapies in postmenopausal women and men aged 18 years and older, based on the following: a. A hip or vertebral(clinical or morphometric) fracture b. T-score < -2.5 at the femoral neck or spine after appropriate evaluation to exclude secondary causes c. Low bone mass (T-score between -1.0 and -2.5 at the femoral neck or spine) and a 10-year probability of  a hip fracture > 3% or a 10-year probability of a major osteoporosis-related fracture > 20% based on the [place]-adapted WHO algorithm 3. Clinician judgment and/or patient preferences may indicate treatment for people with 10-year fracture probabilities above or below these levels FOLLOW-UP: People with diagnosed cases of osteoporosis or at high risk for fracture should have regular bone mineral density tests. For patients eligible for Medicare, routine testing is allowed once every 2 years. The testing frequency can be increased to one year for patients who have rapidly progressing disease, those who are receiving or discontinuing medical therapy to restore bone mass, or have additional risk factors. I have reviewed this report, and agree with the above findings. Mark A. Thornton Papas, M.D. [facility] Electronically Signed   By: Lavonia Dana M.D.   On: 06/01/2019 16:34      ASSESSMENT & PLAN:  1. Malignant neoplasm of overlapping sites of left breast in female, estrogen receptor positive ([place])   2. Aromatase  inhibitor use   Cancer Staging Malignant neoplasm of upper-outer quadrant of left breast in female, estrogen receptor positive ([place]) Staging form: Breast, AJCC 8th Edition - Clinical: Stage IA (cT1b, cN0, cM0, G2, ER+, PR+, HER2-) - Signed by Earlie Server, MD on 03/23/2019 - Pathologic: Stage IA (pT2, pN0, cM0, G2, ER+, PR+, HER2-, Oncotype DX score: 19) - Signed by Earlie Server, MD on 05/29/2019  1. Malignant neoplasm of overlapping sites of left breast in female, estrogen receptor positive ([place])   2. Aromatase inhibitor use    # Left Stage IA breast cancer, She tolerates adjuvant endocrine therapy with Arimidex 17m daily. Continue regimen.  Patient has questions about the side effects of Arimidex and I had further discussion with her.  Side effects of Arimidex including but not limited to hot flush, joint pain, fatigue, mood swing, osteoporosis, increase of cardiovascular events,  discussed with patient. Patient voices understanding and willing to proceed.  Annual right screening mammogram, due in 02/2020.  Reconstruction plan per plastic surgeon.  DEXA is normal. Recommend patient to continue calcium and vitamin D supplementation.    Follow up in  3 months.     Orders Placed This Encounter  Procedures  . CBC with Differential/Platelet    Standing Status:   Future    Standing Expiration Date:   09/27/2020  . Comprehensive metabolic panel    Standing Status:   Future    Standing Expiration Date:   09/27/2020    All questions were answered. The patient knows to call the clinic with any problems questions or concerns.  ZEarlie Server MD, PhD Hematology Oncology [facility] AM[facility] ClinicPager- [phone]/20/2021

## 2019-06-30 ENCOUNTER — Other Ambulatory Visit: Payer: Self-pay

## 2019-06-30 ENCOUNTER — Ambulatory Visit: Payer: 59

## 2019-06-30 DIAGNOSIS — M25612 Stiffness of left shoulder, not elsewhere classified: Secondary | ICD-10-CM

## 2019-06-30 DIAGNOSIS — M25512 Pain in left shoulder: Secondary | ICD-10-CM

## 2019-06-30 NOTE — Therapy (Signed)
[facility] [address] [place], [address] Phone: [phone]   Fax:  [phone]  Physical Therapy Treatment  Patient Details  Name: Tammy Elliott MRN: [id number] Date of Birth: 1950-09-07 Referring Provider (PT): Scheeler MD   Encounter Date: 06/30/2019  PT End of Session - 06/30/19 1728    Visit Number  4    Number of Visits  13    Date for PT Re-Evaluation  07/21/19    Authorization Type  4/10 Medicare    PT Start Time  [id number]    PT Stop Time  1800    PT Time Calculation (min)  45 min    Activity Tolerance  Patient tolerated treatment well    Behavior During Therapy  [facility] for tasks assessed/performed       Past Medical History:  Diagnosis Date  . Basal cell carcinoma    basal cell -skin cancer s/p removal  . CAD S/P percutaneous coronary angioplasty 2012   Dr. Saralyn Pilar ([place], [place]): DES PCI to LAD  . DM (diabetes mellitus), type 2 with complications (HCC)    CAD  . GERD (gastroesophageal reflux disease)   . Hypercholesterolemia   . Hyperlipidemia associated with type 2 diabetes mellitus ([place])    On lovastatin    Past Surgical History:  Procedure Laterality Date  . BREAST BIOPSY Left 03/11/2019   stereo biopsy/ x clip/path pending  . BREAST RECONSTRUCTION WITH PLACEMENT OF TISSUE EXPANDER AND FLEX HD (ACELLULAR HYDRATED DERMIS) Left 05/03/2019   Procedure: LEFT BREAST RECONSTRUCTION WITH PLACEMENT OF TISSUE EXPANDER AND FLEX HD (ACELLULAR HYDRATED DERMIS);  Surgeon: Wallace Going, DO;  Location: [facility] ORS;  Service: Plastics;  Laterality: Left;  . CARDIAC CATHETERIZATION    . CHOLECYSTECTOMY    . COLONOSCOPY    . CORONARY STENT INTERVENTION  2012   Dr. Saralyn Pilar ([place]): DES PCI pLAD  . NM GATED MYOVIEW (ARMX HX)  05/09/2017   [facility]) exercised 8:36 min with no chest pain or dyspnea.  No EKG changes.  Normal EF 66%.  No  ischemia or infarction)  . TOTAL MASTECTOMY Left 05/03/2019   Procedure: TOTAL MASTECTOMY W/ Sentinel Node;  Surgeon: Herbert Pun, MD;  Location: [facility] ORS;  Service: General;  Laterality: Left;    There were no vitals filed for this visit.  Subjective Assessment - 06/30/19 1723    Subjective  Patient states improvement in shoulder flexion but conitnues to have difficulties with bringing her arm into abduction.    Pertinent History  Hx of CA breast 03/12/2019; massectomy 05/03/2019, DMII    Limitations  Lifting    Diagnostic tests  [place], mammogram + breast CA    Patient Stated Goals  Regain use of L Shoulder    Currently in Pain?  No/denies    Pain Onset  More than a month ago        TREATMENT Therapeutic Exercise Doorway stretch with rotation for pec stretching - 2 x 10  Shoulder flexion in sitting with OP from therapist for full range - x 15 Lat Pull down in sitting with 35# at [place] - 2 x 15 Straight arm push downs at [place] - -2 x 15 15#  Shoulder rows in standing at [place] - 2 x 14 20# Shoulder flexion with use of small ball -  x 10  B Shoulder ER in standing RTB - 2 x 15 Tricep extension in standing at [facility] -  2 x 15 15# Ulnar nerve glide in supine - x 15 Push up PLUS on the wall - 2 x 15  Ulnar nerve glide in sitting - x 10   Patient performed exercises to improve strength   PT Education - 06/30/19 1728    Education Details  form/technique with exercise    Person(s) Educated  Patient    Methods  Explanation;Demonstration    Comprehension  Verbalized understanding;Returned demonstration       PT Short Term Goals - 06/09/19 1832      PT SHORT TERM GOAL #1   Title  Patient will be independent with HEP to continue benefits of therapy after discharge    Baseline  dependnent with form and progressions    Time  6    Period  Weeks    Status  New    Target Date  07/21/19        PT Long Term Goals - 06/09/19 1833      PT LONG TERM GOAL #1   Title  Patient will  have all AROM equal to the unaffected side in order to regain full use of L UE especially for reaching overhead    Baseline  Shoulder flexion: 100; abduction; 95    Time  6    Period  Weeks    Status  New    Target Date  07/21/19      PT LONG TERM GOAL #2   Title  Patient will be able to return to a full gym program with high confidence and independence to return to recreation activities and improve UE fitness.    Baseline  Unable to return secondary to pain and limitations    Time  6    Period  Weeks    Status  New    Target Date  07/21/19      PT LONG TERM GOAL #3   Title  Patient will have a significant improvement in her FOTO score to indicate improvement in functional use of her affected UE and be able to carry items without increase in pain    Time  6    Period  Weeks    Status  New    Target Date  07/21/19            Plan - 06/30/19 1735    Clinical Impression Statement  Patient demonstrates improvement with overhead motion and able to perform exercises with greater amount of resistance compared to previous sessions. Patient performs exercises well overhead but conitnues to lack ~20 degrees of shoulder abduction limitating return to recreational activities. Patient will benefit from further skilled therapy to return to prior level of function.    Personal Factors and Comorbidities  Age;Comorbidity 3+    Comorbidities  CA dx, massectomy, DMII    Examination-Activity Limitations  Carry;Reach Overhead;Bathing;Hygiene/Grooming    Examination-Participation Restrictions  Meal Prep    Stability/Clinical Decision Making  Stable/Uncomplicated    Rehab Potential  Good    PT Frequency  2x / week    PT Duration  6 weeks    PT Treatment/Interventions  Electrical Stimulation;Iontophoresis 4mg /ml Dexamethasone;Cryotherapy;Ultrasound;Moist Heat;Therapeutic exercise;Manual techniques;Patient/family education;Passive range of motion;Dry needling;Scar mobilization;Joint Manipulations     PT Next Visit Plan  progress strengthening and improvement of AROM    PT Home Exercise Plan  See education section       Patient will benefit from skilled therapeutic intervention in order to improve the following deficits and impairments:  Increased fascial restricitons, Pain, Decreased  coordination, Decreased mobility, Increased muscle spasms, Postural dysfunction, Decreased range of motion, Decreased endurance, Decreased strength, Hypomobility  Visit Diagnosis: Stiffness of left shoulder, not elsewhere classified  Acute pain of left shoulder     Problem List Patient Active Problem List   Diagnosis Date Noted  . S/P mastectomy, left 05/11/2019  . Breast cancer ([place]) 05/03/2019  . Malignant neoplasm of upper-outer quadrant of left breast in female, estrogen receptor positive ([place]) 03/23/2019  . CAD S/P percutaneous coronary angioplasty 05/04/2018    Blythe Stanford, PT DPT 06/30/2019, 5:43 PM  [facility] [address] [place], [address] Phone: [phone]   Fax:  [phone]  Name: SARESA CAPUCHINO MRN: [id number] Date of Birth: 05/27/1950

## 2019-06-30 NOTE — Progress Notes (Signed)

## 2019-07-01 ENCOUNTER — Encounter (HOSPITAL_BASED_OUTPATIENT_CLINIC_OR_DEPARTMENT_OTHER): Payer: Self-pay | Admitting: Plastic Surgery

## 2019-07-01 ENCOUNTER — Ambulatory Visit (HOSPITAL_BASED_OUTPATIENT_CLINIC_OR_DEPARTMENT_OTHER)
Admission: RE | Admit: 2019-07-01 | Discharge: 2019-07-01 | Disposition: A | Discharge status: Home / Self Care | Payer: 59 | Attending: Plastic Surgery | Admitting: Plastic Surgery

## 2019-07-01 ENCOUNTER — Encounter (HOSPITAL_BASED_OUTPATIENT_CLINIC_OR_DEPARTMENT_OTHER)
Admission: RE | Disposition: A | Discharge status: Home / Self Care | Payer: Self-pay | Source: Home / Self Care | Attending: Plastic Surgery

## 2019-07-01 ENCOUNTER — Other Ambulatory Visit: Payer: Self-pay

## 2019-07-01 ENCOUNTER — Ambulatory Visit (HOSPITAL_BASED_OUTPATIENT_CLINIC_OR_DEPARTMENT_OTHER): Payer: 59 | Admitting: Anesthesiology

## 2019-07-01 DIAGNOSIS — Z79899 Other long term (current) drug therapy: Secondary | ICD-10-CM | POA: Insufficient documentation

## 2019-07-01 DIAGNOSIS — Z7984 Long term (current) use of oral hypoglycemic drugs: Secondary | ICD-10-CM | POA: Diagnosis not present

## 2019-07-01 DIAGNOSIS — C50412 Malignant neoplasm of upper-outer quadrant of left female breast: Secondary | ICD-10-CM

## 2019-07-01 DIAGNOSIS — Z853 Personal history of malignant neoplasm of breast: Secondary | ICD-10-CM | POA: Diagnosis not present

## 2019-07-01 DIAGNOSIS — N651 Disproportion of reconstructed breast: Secondary | ICD-10-CM | POA: Diagnosis not present

## 2019-07-01 DIAGNOSIS — L905 Scar conditions and fibrosis of skin: Secondary | ICD-10-CM | POA: Diagnosis not present

## 2019-07-01 DIAGNOSIS — E1169 Type 2 diabetes mellitus with other specified complication: Secondary | ICD-10-CM | POA: Insufficient documentation

## 2019-07-01 DIAGNOSIS — K219 Gastro-esophageal reflux disease without esophagitis: Secondary | ICD-10-CM | POA: Insufficient documentation

## 2019-07-01 DIAGNOSIS — E7800 Pure hypercholesterolemia, unspecified: Secondary | ICD-10-CM | POA: Insufficient documentation

## 2019-07-01 DIAGNOSIS — Z7902 Long term (current) use of antithrombotics/antiplatelets: Secondary | ICD-10-CM | POA: Diagnosis not present

## 2019-07-01 DIAGNOSIS — Z9012 Acquired absence of left breast and nipple: Secondary | ICD-10-CM | POA: Diagnosis not present

## 2019-07-01 DIAGNOSIS — Z85828 Personal history of other malignant neoplasm of skin: Secondary | ICD-10-CM | POA: Insufficient documentation

## 2019-07-01 DIAGNOSIS — Z170 Estrogen receptor positive status [ER+]: Secondary | ICD-10-CM | POA: Diagnosis not present

## 2019-07-01 DIAGNOSIS — Z955 Presence of coronary angioplasty implant and graft: Secondary | ICD-10-CM | POA: Diagnosis not present

## 2019-07-01 DIAGNOSIS — E785 Hyperlipidemia, unspecified: Secondary | ICD-10-CM | POA: Insufficient documentation

## 2019-07-01 DIAGNOSIS — I2510 Atherosclerotic heart disease of native coronary artery without angina pectoris: Secondary | ICD-10-CM | POA: Insufficient documentation

## 2019-07-01 HISTORY — PX: REMOVAL OF TISSUE EXPANDER AND PLACEMENT OF IMPLANT: SHX6457

## 2019-07-01 HISTORY — PX: MASTOPEXY: SHX5358

## 2019-07-01 LAB — GLUCOSE, CAPILLARY
Glucose-Capillary: 107 mg/dL — ABNORMAL HIGH (ref 70–99)
Glucose-Capillary: 134 mg/dL — ABNORMAL HIGH (ref 70–99)

## 2019-07-01 SURGERY — REMOVAL, TISSUE EXPANDER, BREAST, WITH IMPLANT INSERTION
Anesthesia: General | Site: Breast | Laterality: Right

## 2019-07-01 MED ORDER — KETOROLAC TROMETHAMINE 30 MG/ML IJ SOLN
INTRAMUSCULAR | Status: AC
  Filled 2019-07-01: qty 1

## 2019-07-01 MED ORDER — SODIUM CHLORIDE 0.9 % IV SOLN
INTRAVENOUS | Status: DC | PRN
  Administered 2019-07-01: 250 mL

## 2019-07-01 MED ORDER — FENTANYL CITRATE (PF) 100 MCG/2ML IJ SOLN
INTRAMUSCULAR | Status: AC
  Filled 2019-07-01: qty 2

## 2019-07-01 MED ORDER — PHENYLEPHRINE 40 MCG/ML (10ML) SYRINGE FOR IV PUSH (FOR BLOOD PRESSURE SUPPORT)
PREFILLED_SYRINGE | INTRAVENOUS | Status: AC
  Filled 2019-07-01: qty 10

## 2019-07-01 MED ORDER — SUCCINYLCHOLINE CHLORIDE 200 MG/10ML IV SOSY
PREFILLED_SYRINGE | INTRAVENOUS | Status: AC
  Filled 2019-07-01: qty 10

## 2019-07-01 MED ORDER — HYDROMORPHONE HCL 1 MG/ML IJ SOLN: 0.2500 mg | INTRAMUSCULAR | Status: DC | PRN

## 2019-07-01 MED ORDER — PROPOFOL 10 MG/ML IV BOLUS
INTRAVENOUS | Status: AC
  Filled 2019-07-01: qty 20

## 2019-07-01 MED ORDER — ACETAMINOPHEN 325 MG PO TABS: 650.0000 mg | ORAL_TABLET | ORAL | Status: DC | PRN

## 2019-07-01 MED ORDER — SODIUM CHLORIDE 0.9% FLUSH: 3.0000 mL | Freq: Two times a day (BID) | INTRAVENOUS | Status: DC

## 2019-07-01 MED ORDER — ONDANSETRON HCL 4 MG/2ML IJ SOLN
INTRAMUSCULAR | Status: AC
  Filled 2019-07-01: qty 2

## 2019-07-01 MED ORDER — LACTATED RINGERS IV SOLN: INTRAVENOUS | Status: DC

## 2019-07-01 MED ORDER — DEXAMETHASONE SODIUM PHOSPHATE 4 MG/ML IJ SOLN: INTRAMUSCULAR | Status: DC | PRN

## 2019-07-01 MED ORDER — CHLORHEXIDINE GLUCONATE CLOTH 2 % EX PADS: 6.0000 | MEDICATED_PAD | Freq: Once | CUTANEOUS | Status: DC

## 2019-07-01 MED ORDER — DEXAMETHASONE SODIUM PHOSPHATE 10 MG/ML IJ SOLN
INTRAMUSCULAR | Status: AC
  Filled 2019-07-01: qty 1

## 2019-07-01 MED ORDER — DEXAMETHASONE SODIUM PHOSPHATE 4 MG/ML IJ SOLN
INTRAMUSCULAR | Status: DC | PRN
  Administered 2019-07-01: 5 mg via INTRAVENOUS

## 2019-07-01 MED ORDER — OXYCODONE HCL 5 MG PO TABS: 5.0000 mg | ORAL_TABLET | ORAL | Status: DC | PRN

## 2019-07-01 MED ORDER — ACETAMINOPHEN 80 MG RE SUPP: 650.0000 mg | RECTAL | Status: DC | PRN

## 2019-07-01 MED ORDER — LIDOCAINE-EPINEPHRINE 1 %-1:100000 IJ SOLN
INTRAMUSCULAR | Status: DC | PRN
  Administered 2019-07-01: 10 mL via INTRADERMAL

## 2019-07-01 MED ORDER — PROPOFOL 10 MG/ML IV BOLUS
INTRAVENOUS | Status: DC | PRN
  Administered 2019-07-01: 150 mg via INTRAVENOUS

## 2019-07-01 MED ORDER — CIPROFLOXACIN IN D5W 400 MG/200ML IV SOLN
400.0000 mg | INTRAVENOUS | Status: AC
  Administered 2019-07-01: 400 mg via INTRAVENOUS

## 2019-07-01 MED ORDER — FENTANYL CITRATE (PF) 100 MCG/2ML IJ SOLN
INTRAMUSCULAR | Status: DC | PRN
  Administered 2019-07-01 (×4): 50 ug via INTRAVENOUS

## 2019-07-01 MED ORDER — MIDAZOLAM HCL 2 MG/2ML IJ SOLN: 1.0000 mg | INTRAMUSCULAR | Status: DC | PRN

## 2019-07-01 MED ORDER — FENTANYL CITRATE (PF) 100 MCG/2ML IJ SOLN: 50.0000 ug | INTRAMUSCULAR | Status: DC | PRN

## 2019-07-01 MED ORDER — FENTANYL CITRATE (PF) 100 MCG/2ML IJ SOLN: 25.0000 ug | INTRAMUSCULAR | Status: DC | PRN

## 2019-07-01 MED ORDER — SODIUM CHLORIDE 0.9% FLUSH: 3.0000 mL | INTRAVENOUS | Status: DC | PRN

## 2019-07-01 MED ORDER — AMISULPRIDE (ANTIEMETIC) 5 MG/2ML IV SOLN: 10.0000 mg | Freq: Once | INTRAVENOUS | Status: DC | PRN

## 2019-07-01 MED ORDER — KETOROLAC TROMETHAMINE 30 MG/ML IJ SOLN
30.0000 mg | Freq: Once | INTRAMUSCULAR | Status: AC | PRN
  Administered 2019-07-01: 30 mg via INTRAVENOUS

## 2019-07-01 MED ORDER — MIDAZOLAM HCL 5 MG/5ML IJ SOLN
INTRAMUSCULAR | Status: DC | PRN
  Administered 2019-07-01: 2 mg via INTRAVENOUS

## 2019-07-01 MED ORDER — EPHEDRINE 5 MG/ML INJ
INTRAVENOUS | Status: AC
  Filled 2019-07-01: qty 10

## 2019-07-01 MED ORDER — DIPHENHYDRAMINE HCL 50 MG/ML IJ SOLN
INTRAMUSCULAR | Status: AC
  Filled 2019-07-01: qty 1

## 2019-07-01 MED ORDER — LIDOCAINE HCL (CARDIAC) PF 100 MG/5ML IV SOSY
PREFILLED_SYRINGE | INTRAVENOUS | Status: DC | PRN
  Administered 2019-07-01: 60 mg via INTRAVENOUS

## 2019-07-01 MED ORDER — SODIUM CHLORIDE 0.9 % IV SOLN: 250.0000 mL | INTRAVENOUS | Status: DC | PRN

## 2019-07-01 MED ORDER — ONDANSETRON HCL 4 MG/2ML IJ SOLN
INTRAMUSCULAR | Status: DC | PRN
  Administered 2019-07-01: 4 mg via INTRAVENOUS

## 2019-07-01 MED ORDER — MIDAZOLAM HCL 2 MG/2ML IJ SOLN
INTRAMUSCULAR | Status: AC
  Filled 2019-07-01: qty 2

## 2019-07-01 MED ORDER — DROPERIDOL 2.5 MG/ML IJ SOLN
INTRAMUSCULAR | Status: DC | PRN
  Administered 2019-07-01: .625 mg via INTRAVENOUS

## 2019-07-01 MED ORDER — LIDOCAINE 2% (20 MG/ML) 5 ML SYRINGE
INTRAMUSCULAR | Status: AC
  Filled 2019-07-01: qty 5

## 2019-07-01 MED ORDER — DIPHENHYDRAMINE HCL 50 MG/ML IJ SOLN
INTRAMUSCULAR | Status: DC | PRN
  Administered 2019-07-01: 6.25 mg via INTRAVENOUS

## 2019-07-01 MED ORDER — CIPROFLOXACIN IN D5W 400 MG/200ML IV SOLN
INTRAVENOUS | Status: AC
  Filled 2019-07-01: qty 200

## 2019-07-01 SURGICAL SUPPLY — 82 items
BAG DECANTER FOR FLEXI CONT (MISCELLANEOUS) ×4 IMPLANT
BINDER BREAST LRG (GAUZE/BANDAGES/DRESSINGS) IMPLANT
BINDER BREAST MEDIUM (GAUZE/BANDAGES/DRESSINGS) IMPLANT
BINDER BREAST XLRG (GAUZE/BANDAGES/DRESSINGS) ×4 IMPLANT
BINDER BREAST XXLRG (GAUZE/BANDAGES/DRESSINGS) IMPLANT
BIOPATCH RED 1 DISK 7.0 (GAUZE/BANDAGES/DRESSINGS) IMPLANT
BIOPATCH RED 1IN DISK 7.0MM (GAUZE/BANDAGES/DRESSINGS)
BLADE HEX COATED 2.75 (ELECTRODE) ×4 IMPLANT
BLADE SURG 10 STRL SS (BLADE) ×4 IMPLANT
BLADE SURG 15 STRL LF DISP TIS (BLADE) ×4 IMPLANT
BLADE SURG 15 STRL SS (BLADE) ×4
BNDG GAUZE ELAST 4 BULKY (GAUZE/BANDAGES/DRESSINGS) IMPLANT
CANISTER SUCT 1200ML W/VALVE (MISCELLANEOUS) ×4 IMPLANT
CLOSURE WOUND 1/2 X4 (GAUZE/BANDAGES/DRESSINGS) ×1
COVER BACK TABLE 60X90IN (DRAPES) ×4 IMPLANT
COVER MAYO STAND STRL (DRAPES) ×4 IMPLANT
COVER WAND RF STERILE (DRAPES) IMPLANT
DECANTER SPIKE VIAL GLASS SM (MISCELLANEOUS) IMPLANT
DERMABOND ADVANCED (GAUZE/BANDAGES/DRESSINGS) ×4
DERMABOND ADVANCED .7 DNX12 (GAUZE/BANDAGES/DRESSINGS) ×4 IMPLANT
DRAIN CHANNEL 19F RND (DRAIN) IMPLANT
DRAPE LAPAROSCOPIC ABDOMINAL (DRAPES) ×4 IMPLANT
DRSG PAD ABDOMINAL 8X10 ST (GAUZE/BANDAGES/DRESSINGS) ×8 IMPLANT
DRSG TEGADERM 2-3/8X2-3/4 SM (GAUZE/BANDAGES/DRESSINGS) IMPLANT
ELECT BLADE 4.0 EZ CLEAN MEGAD (MISCELLANEOUS) ×4
ELECT COATED BLADE 2.86 ST (ELECTRODE) ×4 IMPLANT
ELECT REM PT RETURN 9FT ADLT (ELECTROSURGICAL) ×4
ELECTRODE BLDE 4.0 EZ CLN MEGD (MISCELLANEOUS) ×2 IMPLANT
ELECTRODE REM PT RTRN 9FT ADLT (ELECTROSURGICAL) ×2 IMPLANT
EVACUATOR SILICONE 100CC (DRAIN) IMPLANT
GAUZE SPONGE 4X4 12PLY STRL (GAUZE/BANDAGES/DRESSINGS) IMPLANT
GAUZE SPONGE 4X4 12PLY STRL LF (GAUZE/BANDAGES/DRESSINGS) ×8 IMPLANT
GLOVE BIO SURGEON STRL SZ 6.5 (GLOVE) ×6 IMPLANT
GLOVE BIO SURGEON STRL SZ7 (GLOVE) ×4 IMPLANT
GLOVE BIO SURGEONS STRL SZ 6.5 (GLOVE) ×2
GLOVE BIOGEL M STRL SZ7.5 (GLOVE) ×4 IMPLANT
GLOVE BIOGEL PI IND STRL 6.5 (GLOVE) ×2 IMPLANT
GLOVE BIOGEL PI IND STRL 7.0 (GLOVE) ×4 IMPLANT
GLOVE BIOGEL PI INDICATOR 6.5 (GLOVE) ×2
GLOVE BIOGEL PI INDICATOR 7.0 (GLOVE) ×4
GLOVE SURG SYN 7.5  E (GLOVE) ×2
GLOVE SURG SYN 7.5 E (GLOVE) ×2 IMPLANT
GOWN STRL REUS W/ TWL LRG LVL3 (GOWN DISPOSABLE) ×4 IMPLANT
GOWN STRL REUS W/TWL LRG LVL3 (GOWN DISPOSABLE) ×4
IMPL GEL HI PROFILE 350CC (Breast) ×2 IMPLANT
IMPLANT GEL HI PROFILE 350CC (Breast) ×4 IMPLANT
IV NS 1000ML (IV SOLUTION)
IV NS 1000ML BAXH (IV SOLUTION) IMPLANT
NDL SAFETY ECLIPSE 18X1.5 (NEEDLE) ×2 IMPLANT
NEEDLE HYPO 18GX1.5 SHARP (NEEDLE) ×2
NEEDLE HYPO 25X1 1.5 SAFETY (NEEDLE) ×4 IMPLANT
NS IRRIG 1000ML POUR BTL (IV SOLUTION) ×4 IMPLANT
PENCIL SMOKE EVACUATOR (MISCELLANEOUS) ×4 IMPLANT
PIN SAFETY STERILE (MISCELLANEOUS) IMPLANT
SET BASIN DAY SURGERY F.S. (CUSTOM PROCEDURE TRAY) ×4 IMPLANT
SIZER BREAST REUSE STRL 350CC (SIZER) ×4
SIZER BRST REUSE STRL 350CC (SIZER) ×2 IMPLANT
SLEEVE SCD COMPRESS KNEE MED (MISCELLANEOUS) ×4 IMPLANT
SPONGE LAP 18X18 RF (DISPOSABLE) ×8 IMPLANT
STRIP CLOSURE SKIN 1/2X4 (GAUZE/BANDAGES/DRESSINGS) ×3 IMPLANT
STRIP SUTURE WOUND CLOSURE 1/2 (MISCELLANEOUS) ×4 IMPLANT
SUT MNCRL AB 3-0 PS2 18 (SUTURE) IMPLANT
SUT MNCRL AB 4-0 PS2 18 (SUTURE) ×16 IMPLANT
SUT MON AB 3-0 SH 27 (SUTURE) ×6
SUT MON AB 3-0 SH27 (SUTURE) ×6 IMPLANT
SUT MON AB 5-0 PS2 18 (SUTURE) IMPLANT
SUT PDS 3-0 CT2 (SUTURE) ×4
SUT PDS AB 2-0 CT2 27 (SUTURE) IMPLANT
SUT PDS II 3-0 CT2 27 ABS (SUTURE) ×2 IMPLANT
SUT SILK 3 0 PS 1 (SUTURE) IMPLANT
SUT VIC AB 3-0 SH 27 (SUTURE)
SUT VIC AB 3-0 SH 27X BRD (SUTURE) IMPLANT
SUT VICRYL 4-0 PS2 18IN ABS (SUTURE) ×4 IMPLANT
SYR BULB IRRIGATION 50ML (SYRINGE) ×4 IMPLANT
SYR CONTROL 10ML LL (SYRINGE) ×4 IMPLANT
TAPE MEASURE VINYL STERILE (MISCELLANEOUS) ×4 IMPLANT
TOWEL GREEN STERILE FF (TOWEL DISPOSABLE) ×8 IMPLANT
TRAY DSU PREP LF (CUSTOM PROCEDURE TRAY) ×4 IMPLANT
TUBE CONNECTING 20'X1/4 (TUBING) ×1
TUBE CONNECTING 20X1/4 (TUBING) ×3 IMPLANT
UNDERPAD 30X36 HEAVY ABSORB (UNDERPADS AND DIAPERS) ×8 IMPLANT
YANKAUER SUCT BULB TIP NO VENT (SUCTIONS) ×4 IMPLANT

## 2019-07-01 NOTE — Anesthesia Preprocedure Evaluation (Signed)
Anesthesia Evaluation  Patient identified by MRN, date of birth, ID band Patient awake    Reviewed: Allergy & Precautions, NPO status , Patient's Chart, lab work & pertinent test results  Airway Mallampati: II  TM Distance: >3 FB Neck ROM: Full    Dental no notable dental hx.    Pulmonary neg pulmonary ROS,    Pulmonary exam normal breath sounds clear to auscultation       Cardiovascular + CAD and + Cardiac Stents  Normal cardiovascular exam Rhythm:Regular Rate:Normal     Neuro/Psych negative neurological ROS  negative psych ROS   GI/Hepatic Neg liver ROS, GERD  ,  Endo/Other  diabetes  Renal/GU negative Renal ROS  negative genitourinary   Musculoskeletal negative musculoskeletal ROS (+)   Abdominal   Peds negative pediatric ROS (+)  Hematology negative hematology ROS (+)   Anesthesia Other Findings   Reproductive/Obstetrics negative OB ROS                             Anesthesia Physical Anesthesia Plan  ASA: III  Anesthesia Plan: General   Post-op Pain Management:    Induction: Intravenous  PONV Risk Score and Plan: 3 and Ondansetron, Dexamethasone, Treatment may vary due to age or medical condition and Droperidol  Airway Management Planned: LMA  Additional Equipment:   Intra-op Plan:   Post-operative Plan: Extubation in OR  Informed Consent: I have reviewed the patients History and Physical, chart, labs and discussed the procedure including the risks, benefits and alternatives for the proposed anesthesia with the patient or authorized representative who has indicated his/her understanding and acceptance.     Dental advisory given  Plan Discussed with: CRNA and Surgeon  Anesthesia Plan Comments:         Anesthesia Quick Evaluation

## 2019-07-01 NOTE — Discharge Instructions (Addendum)
Next dose of Ibuprofen can be given at 8:45pm if needed.   INSTRUCTIONS FOR AFTER SURGERY   You will likely have some questions about what to expect following your operation.  The following information will help you and your family understand what to expect when you are discharged from the hospital.  Following these guidelines will help ensure a smooth recovery and reduce risks of complications.  Postoperative instructions include information on: diet, wound care, medications and physical activity.  AFTER SURGERY Expect to go home after the procedure.  In some cases, you may need to spend one night in the hospital for observation.  DIET This surgery does not require a specific diet.  However, I have to mention that the healthier you eat the better your body can start healing. It is important to increasing your protein intake.  This means limiting the foods with added sugar.  Focus on fruits and vegetables and some meat.  If you have any liposuction during your procedure be sure to drink water.  If your urine is bright yellow, then it is concentrated, and you need to drink more water.  As a general rule after surgery, you should have 8 ounces of water every hour while awake.  If you find you are persistently nauseated or unable to take in liquids let us know.  NO TOBACCO USE or EXPOSURE.  This will slow your healing process and increase the risk of a wound.  WOUND CARE If you don't have a drain: You can shower the day after surgery.  Use fragrance free soap.  Dial, [place], [place] and Cetaphil are usually mild on the skin.  If you have a drain: Clean with baby wipes until the drain is removed.   If you have steri-strips / tape directly attached to your skin leave them in place. It is OK to get these wet.  No baths, pools or hot tubs for two weeks. We close your incision to leave the smallest and best-looking scar. No ointment or creams on your incisions until given the go ahead.  Especially not Neosporin  (Too many skin reactions with this one).  A few weeks after surgery you can use Mederma and start massaging the scar. We ask you to wear your binder or sports bra for the first 6 weeks around the clock, including while sleeping. This provides added comfort and helps reduce the fluid accumulation at the surgery site.  ACTIVITY No heavy lifting until cleared by the doctor.  It is OK to walk and climb stairs. In fact, moving your legs is very important to decrease your risk of a blood clot.  It will also help keep you from getting deconditioned.  Every 1 to 2 hours get up and walk for 5 minutes. This will help with a quicker recovery back to normal.  Let pain be your guide so you don't do too much.  NO, you cannot do the spring cleaning and don't plan on taking care of anyone else.  This is your time for TLC.   WORK Everyone returns to work at different times. As a rough guide, most people take at least 1 - 2 weeks off prior to returning to work. If you need documentation for your job, bring the forms to your postoperative follow up visit.  DRIVING Arrange for someone to bring you home from the hospital.  You may be able to drive a few days after surgery but not while taking any narcotics or valium.  BOWEL MOVEMENTS Constipation  can occur after anesthesia and while taking pain medication.  It is important to stay ahead for your comfort.  We recommend taking Milk of Magnesia (2 tablespoons; twice a day) while taking the pain pills.  SEROMA This is fluid your body tried to put in the surgical site.  This is normal but if it creates excessive pain and swelling let us know.  It usually decreases in a few weeks.  MEDICATIONS and PAIN CONTROL At your preoperative visit for you history and physical you were given the following medications: 1. An antibiotic: Start this medication when you get home and take according to the instructions on the bottle. 2. Zofran 4 mg:  This is to treat nausea and vomiting.   You can take this every 6 hours as needed and only if needed. 3. Norco (hydrocodone/acetaminophen) 5/325 mg:  This is only to be used after you have taken the motrin or the tylenol. Every 8 hours as needed. Over the counter Medication to take: 4. Ibuprofen (Motrin) 600 mg:  Take this every 6 hours.  If you have additional pain then take 500 mg of the tylenol.  Only take the Norco after you have tried these two. 5. Miralax or stool softener of choice: Take this according to the bottle if you take the [place] Call your surgeon's office if any of the following occur: . Fever 101 degrees F or greater . Excessive bleeding or fluid from the incision site. . Pain that increases over time without aid from the medications . Redness, warmth, or pus draining from incision sites . Persistent nausea or inability to take in liquids . Severe misshapen area that underwent the operation.   Post Anesthesia Home Care Instructions  Activity: Get plenty of rest for the remainder of the day. A responsible individual must stay with you for 24 hours following the procedure.  For the next 24 hours, DO NOT: -Drive a car -Paediatric nurse -Drink alcoholic beverages -Take any medication unless instructed by your physician -Make any legal decisions or sign important papers.  Meals: Start with liquid foods such as gelatin or soup. Progress to regular foods as tolerated. Avoid greasy, spicy, heavy foods. If nausea and/or vomiting occur, drink only clear liquids until the nausea and/or vomiting subsides. Call your physician if vomiting continues.  Special Instructions/Symptoms: Your throat may feel dry or sore from the anesthesia or the breathing tube placed in your throat during surgery. If this causes discomfort, gargle with warm salt water. The discomfort should disappear within 24 hours.  If you had a scopolamine patch placed behind your ear for the management of post- operative nausea and/or  vomiting:  1. The medication in the patch is effective for 72 hours, after which it should be removed.  Wrap patch in a tissue and discard in the trash. Wash hands thoroughly with soap and water. 2. You may remove the patch earlier than 72 hours if you experience unpleasant side effects which may include dry mouth, dizziness or visual disturbances. 3. Avoid touching the patch. Wash your hands with soap and water after contact with the patch.

## 2019-07-01 NOTE — Interval H&P Note (Signed)
History and Physical Interval Note:  07/01/2019 11:49 AM  Tammy Elliott  has presented today for surgery, with the diagnosis of S/p Left Mastectomy, Malignant neoplasm of upper-outer quadrant of left breast in female, estrogen receptor positive.  The various methods of treatment have been discussed with the patient and family. After consideration of risks, benefits and other options for treatment, the patient has consented to  Procedure(s) with comments: REMOVAL OF TISSUE EXPANDER AND PLACEMENT OF IMPLANT (Left) - total case 2.5 hours MASTOPEXY (Right) as a surgical intervention.  The patient's history has been reviewed, patient examined, no change in status, stable for surgery.  I have reviewed the patient's chart and labs.  Questions were answered to the patient's satisfaction.     Loel Lofty Venba Zenner

## 2019-07-01 NOTE — Op Note (Signed)
Op report Bilateral Exchange   DATE OF OPERATION: 07/01/2019  LOCATION: [facility]  SURGICAL DIVISION: Plastic Surgery  PREOPERATIVE DIAGNOSES:  1.History of left breast cancer.  2. Acquired absence of left breast.  3. Breast asymmetry after reconstruction.  POSTOPERATIVE DIAGNOSES:  1. History of left breast cancer.  2. Acquired absence of left breast.  3. Breast asymmetry after reconstruction.  PROCEDURE:  1. Left exchange of tissue expanders for implant. 2. Left breast pocket capsulotomies for implant respositioning. 3. Right breast mastopexy.  SURGEON: Rashawn Rolon Sanger Nabeel Gladson, DO  ASSISTANT: Roetta Sessions, PA  ANESTHESIA:  General.   COMPLICATIONS: None.   IMPLANTS: Left - Mentor Smooth Round High Profile Gel 350cc. Ref [id number].  Serial Number [id number]  INDICATIONS FOR PROCEDURE:  The patient, Tammy Elliott, is a 69 y.o. female born on 09/20/1950, is here for treatment after a left mastectomt.  She had a tissue expander placed at the time of the mastectomy. She now presents for exchange of her expander for an implant.  She requires capsulotomies to better position the implant.  She also wants symmetry surgery to get the right side to match the left.  MRN: [id number]  CONSENT:  Informed consent was obtained directly from the patient. Risks, benefits and alternatives were fully discussed. Specific risks including but not limited to bleeding, infection, hematoma, seroma, scarring, pain, implant infection, implant extrusion, capsular contracture, asymmetry, wound healing problems, and need for further surgery were all discussed. The patient did have an ample opportunity to have her questions answered to her satisfaction.   DESCRIPTION OF PROCEDURE:  The patient was taken to the operating room. SCDs were placed and IV antibiotics were given. The patient's chest was prepped and draped in a sterile fashion. A time out was performed and the implants  to be used were identified.    On the left breast: The old mastectomy scar was excised.  The mastectomy flaps from the superior and inferior flaps were raised over the pectoralis major muscle for one centimeter to minimize tension for the closure. The capsule was split to expose and remove the tissue expander.  Inspection of the pocket showed a normal healthy capsule and good integration of the biologic matrix. Additional inferior skin was excised and the capsule release for better contour at the lateral inferior aspect.  Circumferential capsulotomies were performed to allow for breast pocket expansion.  Measurements were made and a sizer utilized to confirm adequate pocket size for the implant dimensions.  Hemostasis was ensured with the electrocautery.  New gloves were applied. The implant was soaked in antibiotic solution and placed in the pocket and oriented appropriately. The pectoralis major muscle and capsule on the anterior surface were re-closed with a 3-0 PDS suture. The remaining skin was closed with 4-0 Monocryl deep dermal and 5-0 Monocryl subcuticular stitches.    Right Breast:  Preoperative markings were confirmed.  Incision lines were injected with 1% Xylocaine with epinephrine.  After waiting for vasoconstriction, the marked lines were incised.  A Wise-pattern superior mastopexy was performed by de-epithelializing the periareola tissue.  Hemostasis was achieved.  The nipple was gently lifted into position and the soft tissue closed with 3-0 and 4-0 Monocryl.  Hemostasis was confirmed.  The deep tissues were approximated with 3-0 monocryl sutures and the skin was closed with deep dermal and subcuticular 4-0 Monocryl sutures at the vertical limb.  The nipple and skin flaps had good capillary refill at the end of the procedure.    Dermabond  was applied to the incision site. A breast binder and ABDs were placed.  The patient was allowed to wake from anesthesia and taken to the recovery room in  satisfactory condition.   The advanced practice practitioner (APP) assisted throughout the case.  The APP was essential in retraction and counter traction when needed to make the case progress smoothly.  This retraction and assistance made it possible to see the tissue plans for the procedure.  The assistance was needed for blood control, tissue re-approximation and assisted with closure of the incision site.  The [place] was signed into law in 2016 which includes the topic of electronic health records.  This provides immediate access to information in MyChart.  This includes consultation notes, operative notes, office notes, lab results and pathology reports.  If you have any questions about what you read please let us know at your next visit or call us at the office.  We are right here with you.

## 2019-07-01 NOTE — Progress Notes (Signed)
Patient completed antibiotic regime pre-operatively. Catalina Antigua, PA aware and new prescription sent to pharmacy.

## 2019-07-01 NOTE — Transfer of Care (Signed)
Immediate Anesthesia Transfer of Care Note  Patient: Tammy Elliott  Procedure(s) Performed: REMOVAL OF TISSUE EXPANDER AND PLACEMENT OF IMPLANT (Left Breast) MASTOPEXY (Right Breast)  Patient Location: PACU  Anesthesia Type:General  Level of Consciousness: awake, alert , oriented and drowsy  Airway & Oxygen Therapy: Patient Spontanous Breathing and Patient connected to face mask oxygen  Post-op Assessment: Report given to RN and Post -op Vital signs reviewed and stable  Post vital signs: Reviewed and stable  Last Vitals:  Vitals Value Taken Time  BP    Temp    Pulse    Resp    SpO2      Last Pain:  Vitals:   07/01/19 1122  TempSrc: Oral  PainSc: 0-No pain         Complications: No apparent anesthesia complications

## 2019-07-01 NOTE — Anesthesia Procedure Notes (Signed)
Procedure Name: LMA Insertion Date/Time: 07/01/2019 12:24 PM Performed by: Willa Frater, CRNA Pre-anesthesia Checklist: Patient identified, Emergency Drugs available, Suction available and Patient being monitored Patient Re-evaluated:Patient Re-evaluated prior to induction Oxygen Delivery Method: Circle system utilized Preoxygenation: Pre-oxygenation with 100% oxygen Induction Type: IV induction LMA: LMA inserted LMA Size: 4.0 Number of attempts: 1 Dental Injury: Teeth and Oropharynx as per pre-operative assessment

## 2019-07-02 ENCOUNTER — Encounter: Payer: Self-pay | Admitting: *Deleted

## 2019-07-02 ENCOUNTER — Telehealth: Payer: Self-pay | Admitting: Plastic Surgery

## 2019-07-02 LAB — SURGICAL PATHOLOGY

## 2019-07-02 MED ORDER — CIPROFLOXACIN HCL 500 MG PO TABS: 500.0000 mg | ORAL_TABLET | Freq: Two times a day (BID) | ORAL | 0 refills | Status: DC

## 2019-07-02 NOTE — Anesthesia Postprocedure Evaluation (Signed)
Anesthesia Post Note  Patient: Tammy Elliott  Procedure(s) Performed: REMOVAL OF TISSUE EXPANDER AND PLACEMENT OF IMPLANT (Left Breast) MASTOPEXY (Right Breast)     Patient location during evaluation: PACU Anesthesia Type: General Level of consciousness: awake and alert Pain management: pain level controlled Vital Signs Assessment: post-procedure vital signs reviewed and stable Respiratory status: spontaneous breathing, nonlabored ventilation, respiratory function stable and patient connected to nasal cannula oxygen Cardiovascular status: blood pressure returned to baseline and stable Postop Assessment: no apparent nausea or vomiting Anesthetic complications: no    Last Vitals:  Vitals:   07/01/19 1500 07/01/19 1530  BP: 126/88 133/66  Pulse: 78 80  Resp: 15 16  Temp:  36.5 C  SpO2: 94% 99%    Last Pain:  Vitals:   07/01/19 1430  TempSrc:   PainSc: 6                  Takako Minckler S

## 2019-07-02 NOTE — Telephone Encounter (Signed)
Resent rx for cipro x 3 days.

## 2019-07-02 NOTE — Telephone Encounter (Signed)
Patient said that she was supposed to receive a refill on the antibiotic she was prescribed. She took it before surgery instead of waiting until after and she was told at her last appt, a little more would be called in. Please send to [facility] on [address]. Call her to advise.

## 2019-07-06 NOTE — Progress Notes (Signed)
   Subjective:     Patient ID: Tammy Elliott, female    DOB: Dec 21, 1950, 69 y.o.   MRN: [id number]  Chief Complaint  Patient presents with  . Post-op Follow-up    for removal of (L) breast expander w/placement of implant and (R) mastopexy    HPI: The patient is a 69 y.o. female here for follow-up after going left breast exchange of tissue expander for implant and a right breast mastopexy on 07/01/2019 with Dr. Marla Roe.  Patient is doing well today.  She reports that pain has been easily controllable with Tylenol, she has only needed 3 doses of of Tylenol since surgery.  She reports this is common for her, she tolerated last procedure without much needed pain medication as well.  She notes that she feels her breasts are asymmetric when she is not wearing her bra, but when she is wearing a bra she notes that they are symmetric.  She has restarted her Plavix.  Review of Systems  Constitutional: Negative for chills and fever.  Gastrointestinal: Negative.   Skin: Positive for color change. Negative for wound.     Objective:   Vital Signs BP 118/72 (BP Location: Left Arm, Patient Position: Sitting, Cuff Size: Normal)   Pulse 83   Temp (!) 97.5 F (36.4 C) (Temporal)   Ht 5\' 8"  (1.727 m)   Wt 182 lb (82.6 kg)   SpO2 100%   BMI 27.67 kg/m  Vital Signs and Nursing Note Reviewed Chaperone present Physical Exam  Constitutional: She is oriented to person, place, and time and well-developed, well-nourished, and in no distress.  Pulmonary/Chest: Effort normal.    Musculoskeletal:        General: No edema. Normal range of motion.  Neurological: She is alert and oriented to person, place, and time. Gait normal.  Skin: Skin is warm and dry. No rash noted. She is not diaphoretic. No erythema.  Psychiatric: Mood and affect normal.      Assessment/Plan:     ICD-10-CM   1. S/P mastectomy, left  Z90.12   2. Malignant neoplasm of upper-outer quadrant of left breast in female,  estrogen receptor positive ([place])  C50.412    Z17.0    Patient is doing well after expander to implant exchange of left breast and right breast mastopexy.  Her left breast incisions are healing nicely with Steri-Strips still in place.  Right breast incisions healing well with Steri-Strips and Dermabond in place.  She does have a little bit of bruising, but good NAC color and no sign of any infection, hematoma, seroma.  Recommend wearing a compressive binder around breasts or sports bra to prevent additional swelling of right and left breast.  She has not been wearing a sports bra or compressive binder, this will be helpful.  Follow-up in 2 weeks for reevaluation.  Call with any questions or concerns.   Carola Rhine Jackqulyn Mendel, PA-C 07/08/2019, 1:41 PM

## 2019-07-07 ENCOUNTER — Ambulatory Visit: Payer: 59

## 2019-07-08 ENCOUNTER — Ambulatory Visit (INDEPENDENT_AMBULATORY_CARE_PROVIDER_SITE_OTHER): Payer: 59 | Admitting: Surgical

## 2019-07-08 ENCOUNTER — Encounter: Payer: Self-pay | Admitting: Plastic Surgery

## 2019-07-08 ENCOUNTER — Other Ambulatory Visit: Payer: Self-pay

## 2019-07-08 VITALS — BP 118/72 | HR 83 | Temp 97.5°F | Ht 68.0 in | Wt 182.0 lb

## 2019-07-08 DIAGNOSIS — Z9012 Acquired absence of left breast and nipple: Secondary | ICD-10-CM

## 2019-07-08 DIAGNOSIS — Z170 Estrogen receptor positive status [ER+]: Secondary | ICD-10-CM

## 2019-07-08 DIAGNOSIS — C50412 Malignant neoplasm of upper-outer quadrant of left female breast: Secondary | ICD-10-CM

## 2019-07-14 ENCOUNTER — Other Ambulatory Visit: Payer: Self-pay

## 2019-07-14 ENCOUNTER — Ambulatory Visit: Payer: 59 | Attending: Surgical

## 2019-07-14 DIAGNOSIS — M25512 Pain in left shoulder: Secondary | ICD-10-CM | POA: Diagnosis present

## 2019-07-14 DIAGNOSIS — M25612 Stiffness of left shoulder, not elsewhere classified: Secondary | ICD-10-CM | POA: Insufficient documentation

## 2019-07-14 NOTE — Therapy (Signed)
[facility] [address] [place], [address] Phone: [phone]   Fax:  [phone]  Physical Therapy Treatment  Patient Details  Name: Tammy Elliott MRN: [id number] Date of Birth: 27-Apr-1950 Referring Provider (PT): Scheeler MD   Encounter Date: 07/14/2019  PT End of Session - 07/14/19 1757    Visit Number  5    Number of Visits  13    Date for PT Re-Evaluation  07/21/19    Authorization Type  5/10 Medicare    PT Start Time  1745    PT Stop Time  1830    PT Time Calculation (min)  45 min    Activity Tolerance  Patient tolerated treatment well    Behavior During Therapy  [facility] for tasks assessed/performed       Past Medical History:  Diagnosis Date  . Basal cell carcinoma    basal cell -skin cancer s/p removal  . CAD S/P percutaneous coronary angioplasty 2012   Dr. Saralyn Pilar ([place], [place]): DES PCI to LAD  . DM (diabetes mellitus), type 2 with complications (HCC)    CAD  . GERD (gastroesophageal reflux disease)   . Hypercholesterolemia   . Hyperlipidemia associated with type 2 diabetes mellitus ([place])    On lovastatin    Past Surgical History:  Procedure Laterality Date  . BREAST BIOPSY Left 03/11/2019   stereo biopsy/ x clip/path pending  . BREAST RECONSTRUCTION WITH PLACEMENT OF TISSUE EXPANDER AND FLEX HD (ACELLULAR HYDRATED DERMIS) Left 05/03/2019   Procedure: LEFT BREAST RECONSTRUCTION WITH PLACEMENT OF TISSUE EXPANDER AND FLEX HD (ACELLULAR HYDRATED DERMIS);  Surgeon: Wallace Going, DO;  Location: [facility];  Service: Plastics;  Laterality: Left;  . CARDIAC CATHETERIZATION    . CHOLECYSTECTOMY    . COLONOSCOPY    . CORONARY STENT INTERVENTION  2012   Dr. Saralyn Pilar ([place]): DES PCI pLAD  . MASTOPEXY Right 07/01/2019   Procedure: MASTOPEXY;  Surgeon: Wallace Going, DO;  Location: [place];  Service: Plastics;  Laterality:  Right;  . NM GATED MYOVIEW (ARMX HX)  05/09/2017   [facility]) exercised 8:36 min with no chest pain or dyspnea.  No EKG changes.  Normal EF 66%.  No ischemia or infarction)  . REMOVAL OF TISSUE EXPANDER AND PLACEMENT OF IMPLANT Left 07/01/2019   Procedure: REMOVAL OF TISSUE EXPANDER AND PLACEMENT OF IMPLANT;  Surgeon: Wallace Going, DO;  Location: [place];  Service: Plastics;  Laterality: Left;  total case 2.5 hours  . TOTAL MASTECTOMY Left 05/03/2019   Procedure: TOTAL MASTECTOMY W/ Sentinel Node;  Surgeon: Herbert Pun, MD;  Location: [facility];  Service: General;  Laterality: Left;    There were no vitals filed for this visit.  Subjective Assessment - 07/14/19 1752    Subjective  Patient reports the surgery went well however states she continues to have difficulties with performing pec stretches.    Pertinent History  Hx of CA breast 03/12/2019; massectomy 05/03/2019, DMII; 07/06/2019: breast reconstruction surgery    Limitations  Lifting    Diagnostic tests  [place], mammogram + breast CA    Patient Stated Goals  Regain use of L Shoulder    Currently in Pain?  No/denies                TREATMENT Therapeutic Exercise Doorway stretch with rotation for pec stretching - 2 x 10  Lat Pull down in sitting  with 35# at [facility] - 2 x 10 Pulleys in sitting forward - 2 x 10 ; abduction - 2 x 10  Shoulder rows in standing at [place] - 2 x 12 15# Shoulder flexion with use of physioball with YTB around wrist - x 10, x 10 without YTB secondary to wrist comfort Tricep extension in standing at [place] -  x 15, x 10 15# Elbow flexion with 4# -- x 15 hammer position and bicep position - x 15 4#  Ulnar nerve glide in sitting - x 5 Shoulder extension at machine - x 5   Patient performed exercises to improve strength   PT Education - 07/14/19 1756    Education Details  form/technique with exercise    Person(s) Educated  Patient    Methods   Explanation;Demonstration    Comprehension  Verbalized understanding;Returned demonstration       PT Short Term Goals - 06/09/19 1832      PT SHORT TERM GOAL #1   Title  Patient will be independent with HEP to continue benefits of therapy after discharge    Baseline  dependnent with form and progressions    Time  6    Period  Weeks    Status  New    Target Date  07/21/19        PT Long Term Goals - 06/09/19 1833      PT LONG TERM GOAL #1   Title  Patient will have all AROM equal to the unaffected side in order to regain full use of L UE especially for reaching overhead    Baseline  Shoulder flexion: 100; abduction; 95    Time  6    Period  Weeks    Status  New    Target Date  07/21/19      PT LONG TERM GOAL #2   Title  Patient will be able to return to a full gym program with high confidence and independence to return to recreation activities and improve UE fitness.    Baseline  Unable to return secondary to pain and limitations    Time  6    Period  Weeks    Status  New    Target Date  07/21/19      PT LONG TERM GOAL #3   Title  Patient will have a significant improvement in her FOTO score to indicate improvement in functional use of her affected UE and be able to carry items without increase in pain    Time  6    Period  Weeks    Status  New    Target Date  07/21/19            Plan - 07/14/19 1808    Clinical Impression Statement  Patient maintains shoulder AROM after the surgery with no singifcant changes when grossly measured. Performed exercises through a gentle AROM as to not aggravate the surgical area. No increase in pain noted throuhgout the session. Patient will benefit from further skileld therapy focused on improving limitations to return to prior level of function.    Personal Factors and Comorbidities  Age;Comorbidity 3+    Comorbidities  CA dx, massectomy, DMII    Examination-Activity Limitations  Carry;Reach Overhead;Bathing;Hygiene/Grooming     Examination-Participation Restrictions  Meal Prep    Stability/Clinical Decision Making  Stable/Uncomplicated    Rehab Potential  Good    PT Frequency  2x / week    PT Duration  6 weeks    PT  Treatment/Interventions  Printmaker;Iontophoresis 4mg /ml Dexamethasone;Cryotherapy;Ultrasound;Moist Heat;Therapeutic exercise;Manual techniques;Patient/family education;Passive range of motion;Dry needling;Scar mobilization;Joint Manipulations    PT Next Visit Plan  progress strengthening and improvement of AROM    PT Home Exercise Plan  See education section       Patient will benefit from skilled therapeutic intervention in order to improve the following deficits and impairments:  Increased fascial restricitons, Pain, Decreased coordination, Decreased mobility, Increased muscle spasms, Postural dysfunction, Decreased range of motion, Decreased endurance, Decreased strength, Hypomobility  Visit Diagnosis: Stiffness of left shoulder, not elsewhere classified  Acute pain of left shoulder     Problem List Patient Active Problem List   Diagnosis Date Noted  . S/P mastectomy, left 05/11/2019  . Breast cancer ([place]) 05/03/2019  . Malignant neoplasm of upper-outer quadrant of left breast in female, estrogen receptor positive ([place]) 03/23/2019  . CAD S/P percutaneous coronary angioplasty 05/04/2018    Blythe Stanford, PT DPT 07/14/2019, 6:21 PM  [facility] [address] [place], [address] Phone: [phone]   Fax:  [phone]  Name: JOANNA NEUHAUS MRN: [id number] Date of Birth: 22-Sep-1950

## 2019-07-20 ENCOUNTER — Other Ambulatory Visit: Payer: Self-pay

## 2019-07-20 ENCOUNTER — Ambulatory Visit: Payer: 59

## 2019-07-20 DIAGNOSIS — M25512 Pain in left shoulder: Secondary | ICD-10-CM

## 2019-07-20 DIAGNOSIS — M25612 Stiffness of left shoulder, not elsewhere classified: Secondary | ICD-10-CM | POA: Diagnosis not present

## 2019-07-21 NOTE — Therapy (Signed)
[facility] [address] [place], [address] Phone: [phone]   Fax:  [phone]  Physical Therapy Treatment  Patient Details  Name: Tammy Elliott MRN: [id number] Date of Birth: March 31, 1950 Referring Provider (PT): Scheeler MD   Encounter Date: 07/20/2019  PT End of Session - 07/20/19 1711    Visit Number  6    Number of Visits  13    Date for PT Re-Evaluation  07/21/19    Authorization Type  6/10 Medicare    PT Start Time  1700    PT Stop Time  [id number]    PT Time Calculation (min)  45 min    Activity Tolerance  Patient tolerated treatment well    Behavior During Therapy  [facility] for tasks assessed/performed       Past Medical History:  Diagnosis Date  . Basal cell carcinoma    basal cell -skin cancer s/p removal  . CAD S/P percutaneous coronary angioplasty 2012   Dr. Saralyn Pilar ([place], [place]): DES PCI to LAD  . DM (diabetes mellitus), type 2 with complications (HCC)    CAD  . GERD (gastroesophageal reflux disease)   . Hypercholesterolemia   . Hyperlipidemia associated with type 2 diabetes mellitus ([place])    On lovastatin    Past Surgical History:  Procedure Laterality Date  . BREAST BIOPSY Left 03/11/2019   stereo biopsy/ x clip/path pending  . BREAST RECONSTRUCTION WITH PLACEMENT OF TISSUE EXPANDER AND FLEX HD (ACELLULAR HYDRATED DERMIS) Left 05/03/2019   Procedure: LEFT BREAST RECONSTRUCTION WITH PLACEMENT OF TISSUE EXPANDER AND FLEX HD (ACELLULAR HYDRATED DERMIS);  Surgeon: Wallace Going, DO;  Location: [facility];  Service: Plastics;  Laterality: Left;  . CARDIAC CATHETERIZATION    . CHOLECYSTECTOMY    . COLONOSCOPY    . CORONARY STENT INTERVENTION  2012   Dr. Saralyn Pilar ([place]): DES PCI pLAD  . MASTOPEXY Right 07/01/2019   Procedure: MASTOPEXY;  Surgeon: Wallace Going, DO;  Location: [place];  Service: Plastics;  Laterality:  Right;  . NM GATED MYOVIEW (ARMX HX)  05/09/2017   [facility]) exercised 8:36 min with no chest pain or dyspnea.  No EKG changes.  Normal EF 66%.  No ischemia or infarction)  . REMOVAL OF TISSUE EXPANDER AND PLACEMENT OF IMPLANT Left 07/01/2019   Procedure: REMOVAL OF TISSUE EXPANDER AND PLACEMENT OF IMPLANT;  Surgeon: Wallace Going, DO;  Location: [place];  Service: Plastics;  Laterality: Left;  total case 2.5 hours  . TOTAL MASTECTOMY Left 05/03/2019   Procedure: TOTAL MASTECTOMY W/ Sentinel Node;  Surgeon: Herbert Pun, MD;  Location: [facility];  Service: General;  Laterality: Left;    There were no vitals filed for this visit.  Subjective Assessment - 07/20/19 1703    Subjective  Patient states her shoulder has been feeling "really good" and states she's been taking breaks from the exercise as needed secondary to the medicine    Pertinent History  Hx of CA breast 03/12/2019; massectomy 05/03/2019, DMII; 07/06/2019: breast reconstruction surgery    Limitations  Lifting    Diagnostic tests  [place], mammogram + breast CA    Patient Stated Goals  Regain use of L Shoulder    Currently in Pain?  No/denies            TREATMENT Therapeutic Exercise Lat Pull down in sitting with 35# at [facility] - 2 x 12 Pulleys  in sitting forward - 2 x 10 ; abduction - 2 x 10  Shoulder rows in standing at [place] - 2 x 12 25# in sitting Overhead Body blade B UEs - 25 x 2 with large body blade Wall angels performing into shoulder ER in standing against wall - 2 x 8 Attempted Total gym pull ups (unable to tolerate pressure onto chest) TRX shoulder rows in standing - 2 x 12 TRX push ups - x8,  x 12 Wall shoulder ER - 2 x 12 Shoulder press squatting - x5    Patient performed exercises to improve strength    PT Education - 07/20/19 1709    Education Details  form/technique with exercise    Person(s) Educated  Patient    Methods  Explanation;Demonstration     Comprehension  Returned demonstration;Verbalized understanding       PT Short Term Goals - 06/09/19 1832      PT SHORT TERM GOAL #1   Title  Patient will be independent with HEP to continue benefits of therapy after discharge    Baseline  dependnent with form and progressions    Time  6    Period  Weeks    Status  New    Target Date  07/21/19        PT Long Term Goals - 06/09/19 1833      PT LONG TERM GOAL #1   Title  Patient will have all AROM equal to the unaffected side in order to regain full use of L UE especially for reaching overhead    Baseline  Shoulder flexion: 100; abduction; 95    Time  6    Period  Weeks    Status  New    Target Date  07/21/19      PT LONG TERM GOAL #2   Title  Patient will be able to return to a full gym program with high confidence and independence to return to recreation activities and improve UE fitness.    Baseline  Unable to return secondary to pain and limitations    Time  6    Period  Weeks    Status  New    Target Date  07/21/19      PT LONG TERM GOAL #3   Title  Patient will have a significant improvement in her FOTO score to indicate improvement in functional use of her affected UE and be able to carry items without increase in pain    Time  6    Period  Weeks    Status  New    Target Date  07/21/19            Plan - 07/20/19 1717    Clinical Impression Statement  Performed exercises that mimicked gym exercises and pushed into shoulder ER. patient demosntraes decreased ability to acheive full pact position of the shoulder. Patient is improving with ability to perform greater amount of exercises and repetitions compared to previous sessions. Patient is making improvements and patient will benefit from fruther skilled therapy to return to prior level of function.    Personal Factors and Comorbidities  Age;Comorbidity 3+    Comorbidities  CA dx, massectomy, DMII    Examination-Activity Limitations  Carry;Reach  Overhead;Bathing;Hygiene/Grooming    Examination-Participation Restrictions  Meal Prep    Stability/Clinical Decision Making  Stable/Uncomplicated    Rehab Potential  Good    PT Frequency  2x / week    PT Duration  6 weeks  PT Treatment/Interventions  Electrical Stimulation;Iontophoresis 4mg /ml Dexamethasone;Cryotherapy;Ultrasound;Moist Heat;Therapeutic exercise;Manual techniques;Patient/family education;Passive range of motion;Dry needling;Scar mobilization;Joint Manipulations    PT Next Visit Plan  progress strengthening and improvement of AROM    PT Home Exercise Plan  See education section       Patient will benefit from skilled therapeutic intervention in order to improve the following deficits and impairments:  Increased fascial restricitons, Pain, Decreased coordination, Decreased mobility, Increased muscle spasms, Postural dysfunction, Decreased range of motion, Decreased endurance, Decreased strength, Hypomobility  Visit Diagnosis: Stiffness of left shoulder, not elsewhere classified  Acute pain of left shoulder     Problem List Patient Active Problem List   Diagnosis Date Noted  . S/P mastectomy, left 05/11/2019  . Breast cancer ([place]) 05/03/2019  . Malignant neoplasm of upper-outer quadrant of left breast in female, estrogen receptor positive ([place]) 03/23/2019  . CAD S/P percutaneous coronary angioplasty 05/04/2018    Blythe Stanford, PT DPT 07/21/2019, 9:12 AM  [facility] [address] [place], [address] Phone: [phone]   Fax:  [phone]  Name: Tammy Elliott MRN: [id number] Date of Birth: 04/18/1950

## 2019-07-22 ENCOUNTER — Ambulatory Visit: Payer: 59 | Admitting: Plastic Surgery

## 2019-07-23 ENCOUNTER — Ambulatory Visit (INDEPENDENT_AMBULATORY_CARE_PROVIDER_SITE_OTHER): Payer: 59 | Admitting: Plastic Surgery

## 2019-07-23 ENCOUNTER — Encounter: Payer: Self-pay | Admitting: Plastic Surgery

## 2019-07-23 ENCOUNTER — Other Ambulatory Visit: Payer: Self-pay

## 2019-07-23 VITALS — BP 127/78 | HR 86 | Temp 97.5°F | Ht 61.0 in | Wt 179.0 lb

## 2019-07-23 DIAGNOSIS — L989 Disorder of the skin and subcutaneous tissue, unspecified: Secondary | ICD-10-CM

## 2019-07-23 DIAGNOSIS — C50412 Malignant neoplasm of upper-outer quadrant of left female breast: Secondary | ICD-10-CM

## 2019-07-23 DIAGNOSIS — Z9012 Acquired absence of left breast and nipple: Secondary | ICD-10-CM

## 2019-07-23 DIAGNOSIS — Z170 Estrogen receptor positive status [ER+]: Secondary | ICD-10-CM

## 2019-07-23 NOTE — Progress Notes (Signed)
   Subjective:    Patient ID: Tammy Elliott, female    DOB: Jul 26, 1950, 69 y.o.   MRN: [id number]  The patient is a 69 year old female here for follow-up after undergoing a right breast mastopexy reduction and the left breast implant placement after removal of expander.  The patient is concerned about the overall symmetry and size.  Everything is healing very nicely.  She has a little seroma on the right.  All incisions are healing well.  There is no sign of infection.  Review of Systems  Constitutional: Negative.   Eyes: Negative.   Respiratory: Negative.   Cardiovascular: Negative.   Genitourinary: Negative.        Objective:   Physical Exam Vitals and nursing note reviewed.  Constitutional:      Appearance: Normal appearance.  Cardiovascular:     Rate and Rhythm: Normal rate.     Pulses: Normal pulses.  Skin:    General: Skin is warm.  Neurological:     General: No focal deficit present.     Mental Status: She is alert. Mental status is at baseline.  Psychiatric:        Mood and Affect: Mood normal.        Behavior: Behavior normal.        Thought Content: Thought content normal.        Assessment & Plan:     ICD-10-CM   1. S/P mastectomy, left  Z90.12   2. Malignant neoplasm of upper-outer quadrant of left breast in female, estrogen receptor positive ([place])  C50.412    Z17.0   3. Changing skin lesion  L98.9     I encouraged her to start massaging on the left lateral breast area this will take time for everything to settle.  I had like to see her back in a few weeks.  She also has a changing skin lesion on her chest that looks like a basal cell carcinoma.  We will get this excised as well.  I will need to get a picture of it at her next visit.

## 2019-07-27 ENCOUNTER — Ambulatory Visit: Payer: 59

## 2019-07-27 ENCOUNTER — Other Ambulatory Visit: Payer: Self-pay

## 2019-07-27 DIAGNOSIS — M25612 Stiffness of left shoulder, not elsewhere classified: Secondary | ICD-10-CM | POA: Diagnosis not present

## 2019-07-27 DIAGNOSIS — M25512 Pain in left shoulder: Secondary | ICD-10-CM

## 2019-07-28 NOTE — Therapy (Signed)
[facility] [address], [place], [address] Phone: [phone]   Fax:  [phone]  Physical Therapy Treatment/ Progress Note  Patient Details  Name: Tammy Elliott MRN: [id number] Date of Birth: 08/17/1950 Referring Provider (PT): Scheeler MD  Reporting Period: 06/09/2019 - 07/27/2019  Encounter Date: 07/27/2019  PT End of Session - 07/27/19 1700    Visit Number  7    Number of Visits  13    Date for PT Re-Evaluation  07/21/19    Authorization Type  7/10 Medicare    PT Start Time  [id number]    PT Stop Time  1730    PT Time Calculation (min)  40 min    Activity Tolerance  Patient tolerated treatment well    Behavior During Therapy  [facility] for tasks assessed/performed       Past Medical History:  Diagnosis Date  . Basal cell carcinoma    basal cell -skin cancer s/p removal  . CAD S/P percutaneous coronary angioplasty 2012   Dr. Saralyn Pilar ([place], [place]): DES PCI to LAD  . DM (diabetes mellitus), type 2 with complications (HCC)    CAD  . GERD (gastroesophageal reflux disease)   . Hypercholesterolemia   . Hyperlipidemia associated with type 2 diabetes mellitus ([place])    On lovastatin    Past Surgical History:  Procedure Laterality Date  . BREAST BIOPSY Left 03/11/2019   stereo biopsy/ x clip/path pending  . BREAST RECONSTRUCTION WITH PLACEMENT OF TISSUE EXPANDER AND FLEX HD (ACELLULAR HYDRATED DERMIS) Left 05/03/2019   Procedure: LEFT BREAST RECONSTRUCTION WITH PLACEMENT OF TISSUE EXPANDER AND FLEX HD (ACELLULAR HYDRATED DERMIS);  Surgeon: Wallace Going, DO;  Location: [facility];  Service: Plastics;  Laterality: Left;  . CARDIAC CATHETERIZATION    . CHOLECYSTECTOMY    . COLONOSCOPY    . CORONARY STENT INTERVENTION  2012   Dr. Saralyn Pilar ([place]): DES PCI pLAD  . MASTOPEXY Right 07/01/2019   Procedure: MASTOPEXY;  Surgeon: Wallace Going, DO;  Location:  [facility];  Service: Plastics;  Laterality: Right;  . NM GATED MYOVIEW (ARMX HX)  05/09/2017   [facility]) exercised 8:36 min with no chest pain or dyspnea.  No EKG changes.  Normal EF 66%.  No ischemia or infarction)  . REMOVAL OF TISSUE EXPANDER AND PLACEMENT OF IMPLANT Left 07/01/2019   Procedure: REMOVAL OF TISSUE EXPANDER AND PLACEMENT OF IMPLANT;  Surgeon: Wallace Going, DO;  Location: [facility];  Service: Plastics;  Laterality: Left;  total case 2.5 hours  . TOTAL MASTECTOMY Left 05/03/2019   Procedure: TOTAL MASTECTOMY W/ Sentinel Node;  Surgeon: Herbert Pun, MD;  Location: [facility];  Service: General;  Laterality: Left;    There were no vitals filed for this visit.  Subjective Assessment - 07/27/19 1656    Subjective  Patient states she would like to address her AROM more during today's session.    Pertinent History  Hx of CA breast 03/12/2019; massectomy 05/03/2019, DMII; 07/06/2019: breast reconstruction surgery    Limitations  Lifting    Diagnostic tests  [place], mammogram + breast CA    Patient Stated Goals  Regain use of L Shoulder    Currently in Pain?  No/denies         TREATMENT Therapeutic Exercise Pulleys in sitting forward - 2 x 10 ; abduction - 2 x 10  Doorway stretch - x 30 10sec  Shoulder abduction with use of physioball- 2 x 10 Shoulder flexion in standing of physioball with 3# weight - 2 x 10  Shoulder rows in standing at [place] - 2 x 12 25# in sitting Lat Pull down in sitting with 35# at [place] - 2 x 12 Shoulder press with 1# weight - 2 x 12 9090 shoulder ER at wall - x10 , x7 Overhead shoulder adduction with BTB - 2 x 10   Patient performed exercises to improve strength  FOTO: 56     PT Education - 07/27/19 1659    Education Details  form/technique with exercise    Person(s) Educated  Patient    Methods  Explanation;Demonstration    Comprehension  Returned demonstration;Verbalized understanding        PT Short Term Goals - 06/09/19 1832      PT SHORT TERM GOAL #1   Title  Patient will be independent with HEP to continue benefits of therapy after discharge    Baseline  dependnent with form and progressions    Time  6    Period  Weeks    Status  New    Target Date  07/21/19        PT Long Term Goals - 07/27/19 1701      PT LONG TERM GOAL #1   Title  Patient will have all AROM equal to the unaffected side in order to regain full use of L UE especially for reaching overhead    Baseline  Shoulder flexion: 100; abduction; 95; 07/27/2019:    Time  6    Period  Weeks    Status  On-going      PT LONG TERM GOAL #2   Title  Patient will be able to return to a full gym program with high confidence and independence to return to recreation activities and improve UE fitness.    Baseline  Unable to return secondary to pain and limitations    Time  6    Period  Weeks    Status  On-going      PT LONG TERM GOAL #3   Title  Patient will have a significant improvement in her FOTO score to 68 toindicate improvement in functional use of her affected UE and be able to carry items without increase in pain    Baseline  37; 07/27/2019; 07/28/2019: 56    Time  6    Period  Weeks    Status  New            Plan - 07/28/19 0904    Clinical Impression Statement  Patient is making progress towards long term goals with improvement in shoulder AROM, lifting strength and overall mobility. This improvement is indicated by an increase in FOTO score shoulder flexion goniometric measurements and overall ability to perform exercises. Although patient is improving she conitnues to have decreased AROM and poor mobility. Patient will benefit from further skilled therapy to return to prior level of function.    Personal Factors and Comorbidities  Age;Comorbidity 3+;Profession    Comorbidities  CA dx, massectomy, DMII    Examination-Activity Limitations  Carry;Reach Overhead;Bathing;Hygiene/Grooming     Examination-Participation Restrictions  Meal Prep    Stability/Clinical Decision Making  Stable/Uncomplicated    Rehab Potential  Good    PT Frequency  2x / week    PT Duration  6 weeks    PT Treatment/Interventions  Electrical Stimulation;Iontophoresis 4mg /ml Dexamethasone;Cryotherapy;Ultrasound;Moist Heat;Therapeutic exercise;Manual techniques;Patient/family education;Passive range of motion;Dry needling;Scar mobilization;Joint Manipulations  PT Next Visit Plan  progress strengthening and improvement of AROM    PT Home Exercise Plan  See education section       Patient will benefit from skilled therapeutic intervention in order to improve the following deficits and impairments:  Increased fascial restricitons, Pain, Decreased coordination, Decreased mobility, Increased muscle spasms, Postural dysfunction, Decreased range of motion, Decreased endurance, Decreased strength, Hypomobility  Visit Diagnosis: Stiffness of left shoulder, not elsewhere classified  Acute pain of left shoulder     Problem List Patient Active Problem List   Diagnosis Date Noted  . Changing skin lesion 07/23/2019  . S/P mastectomy, left 05/11/2019  . Breast cancer ([place]) 05/03/2019  . Malignant neoplasm of upper-outer quadrant of left breast in female, estrogen receptor positive ([place]) 03/23/2019  . CAD S/P percutaneous coronary angioplasty 05/04/2018    Blythe Stanford, PT DPT 07/28/2019, 9:13 AM  [facility] [address] [place], [address] Phone: [phone]   Fax:  [phone]  Name: Tammy Elliott MRN: [id number] Date of Birth: [birth year]

## 2019-08-03 ENCOUNTER — Ambulatory Visit: Payer: 59

## 2019-08-11 ENCOUNTER — Other Ambulatory Visit: Payer: Self-pay | Admitting: *Deleted

## 2019-08-11 ENCOUNTER — Encounter: Payer: Self-pay | Admitting: Oncology

## 2019-08-11 ENCOUNTER — Telehealth: Payer: Self-pay

## 2019-08-11 MED ORDER — ANASTROZOLE 1 MG PO TABS: 1.0000 mg | ORAL_TABLET | Freq: Every day | ORAL | 0 refills | Status: DC

## 2019-08-11 NOTE — Telephone Encounter (Signed)
T/C to pt to discuss SCP visit.   No answer but left message asking her to return my call to discuss SCP visit.   Waiting for return call

## 2019-08-12 ENCOUNTER — Telehealth: Payer: Self-pay

## 2019-08-12 DIAGNOSIS — Z170 Estrogen receptor positive status [ER+]: Secondary | ICD-10-CM

## 2019-08-12 NOTE — Telephone Encounter (Signed)
Survivorship Care Plan visit completed.  Treatment summary reviewed and mailed to patient.  ASCO answers booklet reviewed and mailed to patient.  CARE program and Cancer Transitions discussed with patient along with other resources cancer center offers to patients and caregivers.  Patient verbalized understanding.  SCP packet mailed.  Patient in agreement for APP to have a Virtual visit to introduce them to the Survivorship Clinic.  Encouraged patient to call for any questions or concerns. 

## 2019-08-25 ENCOUNTER — Inpatient Hospital Stay: Payer: 59 | Attending: Oncology | Admitting: Oncology

## 2019-08-25 ENCOUNTER — Other Ambulatory Visit: Payer: Self-pay

## 2019-08-26 ENCOUNTER — Ambulatory Visit (INDEPENDENT_AMBULATORY_CARE_PROVIDER_SITE_OTHER): Payer: 59 | Admitting: Plastic Surgery

## 2019-08-26 ENCOUNTER — Other Ambulatory Visit (HOSPITAL_COMMUNITY)
Admission: RE | Admit: 2019-08-26 | Discharge: 2019-08-26 | Disposition: A | Discharge status: Home / Self Care | Payer: 59 | Source: Ambulatory Visit | Attending: Plastic Surgery | Admitting: Plastic Surgery

## 2019-08-26 ENCOUNTER — Encounter: Payer: Self-pay | Admitting: Plastic Surgery

## 2019-08-26 VITALS — BP 107/69 | HR 86 | Temp 96.4°F

## 2019-08-26 DIAGNOSIS — L989 Disorder of the skin and subcutaneous tissue, unspecified: Secondary | ICD-10-CM | POA: Diagnosis present

## 2019-08-26 NOTE — Progress Notes (Signed)
Procedure Note  Preoperative Dx: changing skin lesion chest  Postoperative Dx: Same  Procedure: Excision of changing skin lesion chest 3 cm  Anesthesia: Lidocaine 1% with 1:100,000 epinepherine  Description of Procedure: Risks and complications were explained to the patient.  Consent was confirmed and the patient understands the risks and benefits.  The potential complications and alternatives were explained and the patient consents.  The patient expressed understanding the option of not having the procedure and the risks of a scar.  Time out was called and all information was confirmed to be correct.    The area was prepped and drapped.  Lidocaine 1% with epinepherine was injected in the subcutaneous area.  After waiting several minutes for the local to take affect a #15 blade was used to excise the area in an eliptical pattern.  A 4-0 Monocryl was used to close the deep layers with simple interrupted stitches.  The skin edges were reapproximated with 5-0 Monocryl subcuticular running closure.  A dressing was applied.  The patient was given instructions on how to care for the area and a follow up appointment.  Tammy Elliott tolerated the procedure well and there were no complications. The specimen was sent to pathology.

## 2019-08-27 LAB — SURGICAL PATHOLOGY

## 2019-09-06 ENCOUNTER — Ambulatory Visit (INDEPENDENT_AMBULATORY_CARE_PROVIDER_SITE_OTHER): Payer: 59 | Admitting: Surgical

## 2019-09-06 ENCOUNTER — Other Ambulatory Visit: Payer: Self-pay

## 2019-09-06 ENCOUNTER — Encounter: Payer: Self-pay | Admitting: Surgical

## 2019-09-06 VITALS — BP 121/75 | HR 83 | Temp 97.1°F

## 2019-09-06 DIAGNOSIS — C44529 Squamous cell carcinoma of skin of other part of trunk: Secondary | ICD-10-CM

## 2019-09-06 NOTE — Progress Notes (Signed)
   Subjective:     Patient ID: Tammy Elliott, female    DOB: 04/17/1950, 69 y.o.   MRN: [id number]  Chief Complaint  Patient presents with  . Follow-up    HPI: The patient is a 69 y.o. female here for follow-up after excision of changing skin lesion as well as the right chest on 08/26/2019 with Dr. Marla Roe.  Pathology showed well-differentiated squamous cell carcinoma with close margins.  Incision is healing well, Steri-Strips placed, no drainage or periincisional erythema noted.  No wounds noted.  Sutures in place.  Review of Systems  Constitutional: Negative.   Respiratory: Negative.   Skin: Negative.      Objective:   Vital Signs BP 121/75 (BP Location: Left Arm, Patient Position: Sitting, Cuff Size: Large)   Pulse 83   Temp (!) 97.1 F (36.2 C) (Temporal)   SpO2 98%  Vital Signs and Nursing Note Reviewed Physical Exam Constitutional:      Appearance: Normal appearance.  HENT:     Head: Normocephalic and atraumatic.  Pulmonary:     Effort: Pulmonary effort is normal.  Chest:    Neurological:     Mental Status: She is alert.  Psychiatric:        Mood and Affect: Mood normal.        Behavior: Behavior normal.     Assessment/Plan:     ICD-10-CM   1. 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   Incision healing well, no sign of infection.  Plan for re-excision on 11/02/19.  Call with any questions or concerns.   Carola Rhine Joury Allcorn, PA-C 09/06/2019, 4:21 PM

## 2019-09-15 ENCOUNTER — Encounter: Payer: Self-pay | Admitting: Plastic Surgery

## 2019-09-20 ENCOUNTER — Telehealth: Payer: Self-pay | Admitting: Plastic Surgery

## 2019-09-20 NOTE — Telephone Encounter (Signed)
Sent message through patient portal:  Hi Tammy Elliott!  I spoke with Catalina Antigua, our PA and he said that you should be far along from your last breast surgery back in April of this year, along with your excision of changing skin lesion.  Should you have any further questions, please fill free to give our office a call.  Windy Canny, CMA

## 2019-09-20 NOTE — Telephone Encounter (Signed)
Patient called to state she sent message through mychart asking if she could participate in a boot camp at the gym but didn't receive a response yet. Patient said it's okay to respond to her via mychart to advise if she is healed enough to do the boot camp which starts next week.

## 2019-09-28 ENCOUNTER — Inpatient Hospital Stay (HOSPITAL_BASED_OUTPATIENT_CLINIC_OR_DEPARTMENT_OTHER): Payer: 59 | Admitting: Oncology

## 2019-09-28 ENCOUNTER — Other Ambulatory Visit: Payer: Self-pay

## 2019-09-28 ENCOUNTER — Encounter: Payer: Self-pay | Admitting: Oncology

## 2019-09-28 ENCOUNTER — Inpatient Hospital Stay: Payer: 59 | Attending: Oncology

## 2019-09-28 VITALS — BP 120/73 | HR 77 | Temp 96.7°F | Resp 16 | Wt 180.1 lb

## 2019-09-28 DIAGNOSIS — Z79811 Long term (current) use of aromatase inhibitors: Secondary | ICD-10-CM

## 2019-09-28 DIAGNOSIS — Z79899 Other long term (current) drug therapy: Secondary | ICD-10-CM | POA: Insufficient documentation

## 2019-09-28 DIAGNOSIS — K219 Gastro-esophageal reflux disease without esophagitis: Secondary | ICD-10-CM | POA: Insufficient documentation

## 2019-09-28 DIAGNOSIS — Z85828 Personal history of other malignant neoplasm of skin: Secondary | ICD-10-CM | POA: Insufficient documentation

## 2019-09-28 DIAGNOSIS — I2510 Atherosclerotic heart disease of native coronary artery without angina pectoris: Secondary | ICD-10-CM | POA: Diagnosis not present

## 2019-09-28 DIAGNOSIS — Z7984 Long term (current) use of oral hypoglycemic drugs: Secondary | ICD-10-CM | POA: Diagnosis not present

## 2019-09-28 DIAGNOSIS — E119 Type 2 diabetes mellitus without complications: Secondary | ICD-10-CM | POA: Diagnosis not present

## 2019-09-28 DIAGNOSIS — E7800 Pure hypercholesterolemia, unspecified: Secondary | ICD-10-CM | POA: Diagnosis not present

## 2019-09-28 DIAGNOSIS — Z8249 Family history of ischemic heart disease and other diseases of the circulatory system: Secondary | ICD-10-CM | POA: Insufficient documentation

## 2019-09-28 DIAGNOSIS — Z170 Estrogen receptor positive status [ER+]: Secondary | ICD-10-CM | POA: Diagnosis not present

## 2019-09-28 DIAGNOSIS — E785 Hyperlipidemia, unspecified: Secondary | ICD-10-CM | POA: Insufficient documentation

## 2019-09-28 DIAGNOSIS — Z1231 Encounter for screening mammogram for malignant neoplasm of breast: Secondary | ICD-10-CM | POA: Diagnosis not present

## 2019-09-28 DIAGNOSIS — Z833 Family history of diabetes mellitus: Secondary | ICD-10-CM | POA: Diagnosis not present

## 2019-09-28 DIAGNOSIS — C50812 Malignant neoplasm of overlapping sites of left female breast: Secondary | ICD-10-CM | POA: Diagnosis not present

## 2019-09-28 LAB — CBC WITH DIFFERENTIAL/PLATELET
Abs Immature Granulocytes: 0.01 10*3/uL (ref 0.00–0.07)
Basophils Absolute: 0.1 10*3/uL (ref 0.0–0.1)
Basophils Relative: 1 %
Eosinophils Absolute: 0.1 10*3/uL (ref 0.0–0.5)
Eosinophils Relative: 2 %
HCT: 41.8 % (ref 36.0–46.0)
Hemoglobin: 14.1 g/dL (ref 12.0–15.0)
Immature Granulocytes: 0 %
Lymphocytes Relative: 34 %
Lymphs Abs: 2.1 10*3/uL (ref 0.7–4.0)
MCH: 30.2 pg (ref 26.0–34.0)
MCHC: 33.7 g/dL (ref 30.0–36.0)
MCV: 89.5 fL (ref 80.0–100.0)
Monocytes Absolute: 0.6 10*3/uL (ref 0.1–1.0)
Monocytes Relative: 9 %
Neutro Abs: 3.4 10*3/uL (ref 1.7–7.7)
Neutrophils Relative %: 54 %
Platelets: 244 10*3/uL (ref 150–400)
RBC: 4.67 MIL/uL (ref 3.87–5.11)
RDW: 13.1 % (ref 11.5–15.5)
WBC: 6.2 10*3/uL (ref 4.0–10.5)
nRBC: 0 % (ref 0.0–0.2)

## 2019-09-28 LAB — COMPREHENSIVE METABOLIC PANEL
ALT: 24 U/L (ref 0–44)
AST: 29 U/L (ref 15–41)
Albumin: 4.4 g/dL (ref 3.5–5.0)
Alkaline Phosphatase: 63 U/L (ref 38–126)
Anion gap: 9 (ref 5–15)
BUN: 21 mg/dL (ref 8–23)
CO2: 27 mmol/L (ref 22–32)
Calcium: 9.2 mg/dL (ref 8.9–10.3)
Chloride: 102 mmol/L (ref 98–111)
Creatinine, Ser: 0.9 mg/dL (ref 0.44–1.00)
GFR calc Af Amer: >60 mL/min (ref >60)
GFR calc non Af Amer: >60 mL/min (ref >60)
Glucose, Bld: 160 mg/dL — ABNORMAL HIGH (ref 70–99)
Potassium: 4.3 mmol/L (ref 3.5–5.1)
Sodium: 138 mmol/L (ref 135–145)
Total Bilirubin: 0.6 mg/dL (ref 0.3–1.2)
Total Protein: 7.5 g/dL (ref 6.5–8.1)

## 2019-09-28 NOTE — Progress Notes (Signed)
Patient denies new problems/concerns today.   °

## 2019-09-28 NOTE — Progress Notes (Signed)
Hematology/Oncology follow up note [facility] Telephone:(336) [phone] Fax:(336) [phone]   Patient Care Team: Baxter Hire, MD as PCP - General (Internal Medicine) Leonie Man, MD as PCP - Cardiology (Cardiology) Earlie Server, MD as Consulting Physician (Oncology) Herbert Pun, MD as Consulting Physician (General Surgery) Dillingham, Loel Lofty, DO as Attending Physician (Plastic Surgery) Rico Junker, RN as Registered Nurse Theodore Demark, RN as Registered Nurse  REFERRING PROVIDER: Baxter Hire, MD  CHIEF COMPLAINTS/REASON FOR VISIT:  Follow up of breast cancer  HISTORY OF PRESENTING ILLNESS:   Tammy Elliott is a  69 y.o.  female with PMH listed below was seen in consultation at the request of  Baxter Hire, MD  for evaluation of breast cancer Patient had screening mammogram done on 02/16/2019 which showed left breast asymmetry with distortion in the upper outer quadrant. 03/03/2019 left diagnostic mammogram showed small asymmetry noted on the screening study appears to be a small ill-defined mass with associated subtle distortion.  1:30 position of the left breast.  7 cm from the nipple. Sonographic evaluation of the left axillary showed no enlarged or abnormal lymph nodes. Patient underwent stereotactic core needle biopsy of the mass. 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  Grade 2, ER > 90%, PR 1 to 10%, HER-2 negative. Patient denies any family history of breast cancer. Menarche 69 years old, menopause 69 years old Remote use of OCP for about 10 to 15 years, Never used hormone replacement therapy. Denies any history of radiation to the chest.  03/13/2019 Breast MRI bilateral showed numerous suspicious enhancing masses through out the upper inner and upper outer left breast concerning for multicentric diseases. Suspicious findings span greater  than 6cm in both AP and transverse dimensions.  MRI biopsy of left breast central middle and central anterior mass showed invasive mammary carcinoma, grade 1/2.  # 05/03/2019 patient underwent left total mastectomy, and SLNB.  Pathology showed invasive lobular carcinoma, multifocal, LCIS, 1 lymph node negative for malignancy.  Stage IA, pT2(m) pN0 (sn), Grade 2, Margins are not involved by invasive carcinoma.  ER 95%, PR 30%, HER2 IHC 2+ equivocal, FISH negative.   OncotypeDX recurrence score is 19, patient is postmenopausal, chemotherapy benefit is <1%, I will not offer adjuvant chemotherapy.    INTERVAL HISTORY Tammy Elliott is a 69 y.o. female who has above history reviewed by me today presents for follow up visit for management of breast cancer. Problems and complaints are listed below: She has started Arimidex 30m daily , she experiences intermittent hot flash.  Otherwise tolerates well. She has had left breast reconstruction  Review of Systems  Constitutional: Negative for appetite change, chills, fatigue and fever.  HENT:   Negative for hearing loss and voice change.   Eyes: Negative for eye problems.  Respiratory: Negative for chest tightness and cough.   Cardiovascular: Negative for chest pain.  Gastrointestinal: Negative for abdominal distention, abdominal pain and blood in stool.  Endocrine: Negative for hot flashes.  Genitourinary: Negative for difficulty urinating and frequency.   Musculoskeletal: Negative for arthralgias.  Skin: Negative for itching and rash.  Neurological: Negative for extremity weakness.  Hematological: Negative for adenopathy.  Psychiatric/Behavioral: Negative for confusion.    MEDICAL HISTORY:  Past Medical History:  Diagnosis Date  . Basal cell carcinoma    basal cell -skin cancer s/p removal  . CAD S/P percutaneous coronary angioplasty 2012   Dr.  Paraschos ([place], [place]): DES PCI to LAD  . DM (diabetes mellitus), type  2 with complications (HCC)    CAD  . GERD (gastroesophageal reflux disease)   . Hypercholesterolemia   . Hyperlipidemia associated with type 2 diabetes mellitus ([place])    On lovastatin    SURGICAL HISTORY: Past Surgical History:  Procedure Laterality Date  . BREAST BIOPSY Left 03/11/2019   stereo biopsy/ x clip/path pending  . BREAST RECONSTRUCTION WITH PLACEMENT OF TISSUE EXPANDER AND FLEX HD (ACELLULAR HYDRATED DERMIS) Left 05/03/2019   Procedure: LEFT BREAST RECONSTRUCTION WITH PLACEMENT OF TISSUE EXPANDER AND FLEX HD (ACELLULAR HYDRATED DERMIS);  Surgeon: Wallace Going, DO;  Location: [facility];  Service: Plastics;  Laterality: Left;  . CARDIAC CATHETERIZATION    . CHOLECYSTECTOMY    . COLONOSCOPY    . CORONARY STENT INTERVENTION  2012   Dr. Saralyn Pilar ([place]): DES PCI pLAD  . MASTOPEXY Right 07/01/2019   Procedure: MASTOPEXY;  Surgeon: Wallace Going, DO;  Location: [place];  Service: Plastics;  Laterality: Right;  . NM GATED MYOVIEW (ARMX HX)  05/09/2017   [facility]) exercised 8:36 min with no chest pain or dyspnea.  No EKG changes.  Normal EF 66%.  No ischemia or infarction)  . REMOVAL OF TISSUE EXPANDER AND PLACEMENT OF IMPLANT Left 07/01/2019   Procedure: REMOVAL OF TISSUE EXPANDER AND PLACEMENT OF IMPLANT;  Surgeon: Wallace Going, DO;  Location: [place];  Service: Plastics;  Laterality: Left;  total case 2.5 hours  . TOTAL MASTECTOMY Left 05/03/2019   Procedure: TOTAL MASTECTOMY W/ Sentinel Node;  Surgeon: Herbert Pun, MD;  Location: [facility];  Service: General;  Laterality: Left;    SOCIAL HISTORY: Social History   Socioeconomic History  . Marital status: Married    Spouse name: Not on file  . Number of children: Not on file  . Years of education: Not on file  . Highest education level: Not on file  Occupational History  . Occupation: Printmaker: [employer]   Tobacco Use  . Smoking status: Never Smoker  . Smokeless tobacco: Never Used  Vaping Use  . Vaping Use: Never used  Substance and Sexual Activity  . Alcohol use: Not Currently  . Drug use: Never  . Sexual activity: Not on file  Other Topics Concern  . Not on file  Social History Narrative   reports that she has never smoked. She has never used smokeless tobacco. She reports that she rarely drinks alcohol. She reports that she does not use drugs.      Married to [name] ([name]) [name]    Social Determinants of Health   Financial Resource Strain:   . Difficulty of Paying Living Expenses:   Food Insecurity:   . Worried About Charity fundraiser in the Last Year:   . Arboriculturist in the Last Year:   Transportation Needs:   . Film/video editor (Medical):   Marland Kitchen Lack of Transportation (Non-Medical):   Physical Activity:   . Days of Exercise per Week:   . Minutes of Exercise per Session:   Stress:   . Feeling of Stress :   Social Connections:   . Frequency of Communication with Friends and Family:   . Frequency of Social Gatherings with Friends and Family:   . Attends Religious Services:   . Active Member of Clubs or Organizations:   . Attends Archivist  Meetings:   Marland Kitchen Marital Status:   Intimate Partner Violence:   . Fear of Current or Ex-Partner:   . Emotionally Abused:   Marland Kitchen Physically Abused:   . Sexually Abused:     FAMILY HISTORY: Family History  Problem Relation Age of Onset  . CAD Mother   . Diabetes Mellitus II Mother   . Hypertension Mother   . Uterine cancer Mother   . CAD Father   . Heart attack Father   . Hypertension Father   . Diabetes Mellitus II Father        Took insulin  . Alcohol abuse Father   . Diabetes Mellitus II Sister   . Diabetes Other        Almost all sisters are diabetic or pre-diabetic   . Breast cancer Neg Hx     ALLERGIES:  is allergic to exenatide, orlistat, penicillins, and thiazide-type diuretics.  MEDICATIONS:   Current Outpatient Medications  Medication Sig Dispense Refill  . anastrozole (ARIMIDEX) 1 MG tablet Take 1 tablet (1 mg total) by mouth daily. 90 tablet 0  . CALCIUM-MAGNESIUM-ZINC PO Take 1 tablet by mouth every evening.     . clopidogrel (PLAVIX) 75 MG tablet Take 75 mg by mouth daily.     Marland Kitchen glimepiride (AMARYL) 4 MG tablet Take 4 mg by mouth 2 (two) times daily.     Marland Kitchen JANUMET 50-500 MG tablet Take 1 tablet by mouth 2 (two) times daily.    Marland Kitchen JARDIANCE 25 MG TABS tablet Take 25 mg by mouth daily.     . lansoprazole (PREVACID) 15 MG capsule Take 15 mg by mouth daily.     Marland Kitchen lovastatin (MEVACOR) 40 MG tablet Take 40 mg by mouth at bedtime.     . metFORMIN (GLUCOPHAGE) 500 MG tablet Take 500 mg by mouth 2 (two) times daily.     . Multiple Vitamin (MULTIVITAMIN) tablet Take 1 tablet by mouth daily.    . Vitamin D, Cholecalciferol, 25 MCG (1000 UT) TABS Take by mouth.    . ciprofloxacin (CIPRO) 500 MG tablet Take 1 tablet (500 mg total) by mouth 2 (two) times daily. (Patient not taking: Reported on 09/28/2019) 6 tablet 0  . diazepam (VALIUM) 2 MG tablet Take 1 tablet (2 mg total) by mouth every 12 (twelve) hours as needed for muscle spasms. (Patient not taking: Reported on 09/28/2019) 20 tablet 0  . ondansetron (ZOFRAN) 4 MG tablet Take 1 tablet (4 mg total) by mouth every 8 (eight) hours as needed for nausea or vomiting. (Patient not taking: Reported on 09/28/2019) 20 tablet 0   No current facility-administered medications for this visit.     PHYSICAL EXAMINATION: ECOG PERFORMANCE STATUS: 0 - Asymptomatic Vitals:   09/28/19 1402  BP: 120/73  Pulse: 77  Resp: 16  Temp: (!) 96.7 F (35.9 C)   Filed Weights   09/28/19 1402  Weight: 180 lb 1.6 oz (81.7 kg)    Physical Exam Constitutional:      General: She is not in acute distress. HENT:     Head: Normocephalic and atraumatic.  Eyes:     General: No scleral icterus.    Pupils: Pupils are equal, round, and reactive to light.   Cardiovascular:     Rate and Rhythm: Normal rate and regular rhythm.     Heart sounds: Normal heart sounds.  Pulmonary:     Effort: Pulmonary effort is normal. No respiratory distress.     Breath sounds: No wheezing.  Abdominal:  General: Bowel sounds are normal. There is no distension.     Palpations: Abdomen is soft. There is no mass.     Tenderness: There is no abdominal tenderness.  Musculoskeletal:        General: No deformity. Normal range of motion.     Cervical back: Normal range of motion and neck supple.  Skin:    General: Skin is warm and dry.     Findings: No erythema or rash.  Neurological:     Mental Status: She is alert and oriented to person, place, and time. Mental status is at baseline.     Cranial Nerves: No cranial nerve deficit.     Coordination: Coordination normal.  Psychiatric:        Mood and Affect: Mood normal.    LABORATORY DATA:  I have reviewed the data as listed Lab Results  Component Value Date   WBC 6.2 09/28/2019   HGB 14.1 09/28/2019   HCT 41.8 09/28/2019   MCV 89.5 09/28/2019   PLT 244 09/28/2019   Recent Labs    03/23/19 1005 05/03/19 1911 06/28/19 1027 06/29/19 0935 09/28/19 1344  NA 138  --  140 138 138  K 4.4  --  4.3 4.4 4.3  CL 101  --  103 101 102  CO2 25  --  '22 25 27  ' GLUCOSE 219*  --  178* 171* 160*  BUN 18  --  '15 16 21  ' CREATININE 0.97   < > 0.84 0.84 0.90  CALCIUM 9.5  --  9.5 9.5 9.2  GFRNONAA >60   < > >60 >60 >60  GFRAA >60   < > >60 >60 >60  PROT 7.6  --   --  7.3 7.5  ALBUMIN 4.2  --   --  4.3 4.4  AST 23  --   --  30 29  ALT 23  --   --  29 24  ALKPHOS 57  --   --  61 63  BILITOT 0.6  --   --  0.5 0.6   < > = values in this interval not displayed.   Iron/TIBC/Ferritin/ %Sat No results found for: IRON, TIBC, FERRITIN, IRONPCTSAT    RADIOGRAPHIC STUDIES: I have personally reviewed the radiological images as listed and agreed with the findings in the report.  No results found.    ASSESSMENT  & PLAN:  1. Malignant neoplasm of overlapping sites of left breast in female, estrogen receptor positive ([place])   2. Screening mammogram, encounter for   Cancer Staging Malignant neoplasm of upper-outer quadrant of left breast in female, estrogen receptor positive ([place]) Staging form: Breast, AJCC 8th Edition - Clinical: Stage IA (cT1b, cN0, cM0, G2, ER+, PR+, HER2-) - Signed by Earlie Server, MD on 03/23/2019 - Pathologic: Stage IA (pT2, pN0, cM0, G2, ER+, PR+, HER2-, Oncotype DX score: 19) - Signed by Earlie Server, MD on 05/29/2019  1. Malignant neoplasm of overlapping sites of left breast in female, estrogen receptor positive ([place])   2. Screening mammogram, encounter for   3. Aromatase inhibitor use    # Left Stage IA breast cancer, She tolerates adjuvant endocrine therapy with Arimidex 71m daily.  Labs reviewed and discussed with patient. Continue current regimen. She has questions about the side effects of Arimidex and I further discussed with her. Also discussed about Effexor if she develops severe symptoms.  She feels that hot flashes manageable.  Continue monitor. Patient is due for annual right screening mammogram in December  2021.  Previous DEXA is normal.  Continue calcium and vitamin D supplementation .  Follow-up in 6 months.   Orders Placed This Encounter  Procedures  . MM 3D SCREEN BREAST UNI RIGHT    Standing Status:   Future    Standing Expiration Date:   09/27/2020    Order Specific Question:   Reason for Exam (SYMPTOM  OR DIAGNOSIS REQUIRED)    Answer:   annual screening mammogram, history of breast cancer    Order Specific Question:   Preferred imaging location?    Answer:   [facility]  . CBC with Differential/Platelet    Standing Status:   Future    Standing Expiration Date:   09/27/2020  . Comprehensive metabolic panel    Standing Status:   Future    Standing Expiration Date:   09/27/2020    All questions were answered. The patient knows to call the clinic with  any problems questions or concerns.  Earlie Server, MD, PhD Hematology Oncology [facility] at [facility] Pager- [phone] 09/28/2019

## 2019-10-17 NOTE — Progress Notes (Unsigned)
Survivorship Clinic Consult Note [facility]  Telephone:([phone] Fax:(336) [phone]  CLINIC:  Survivorship  REASON FOR VISIT:  Survivorship surveillance visit for patient with history of breast cancer   BRIEF ONCOLOGIC HISTORY:  Oncology History  Malignant neoplasm of upper-outer quadrant of left breast in female, estrogen receptor positive ([place])  03/23/2019 Initial Diagnosis   Malignant neoplasm of upper-outer quadrant of left breast in female, estrogen receptor positive ([place])   03/23/2019 Cancer Staging   Staging form: Breast, AJCC 8th Edition - Clinical: Stage IA (cT1b, cN0, cM0, G2, ER+, PR+, HER2-) - Signed by Earlie Server, MD on 03/23/2019   05/29/2019 Cancer Staging   Staging form: Breast, AJCC 8th Edition - Pathologic: Stage IA (pT2, pN0, cM0, G2, ER+, PR+, HER2-, Oncotype DX score: 19) - Signed by Earlie Server, MD on 05/29/2019      INTERVAL HISTORY:  ***    ADDITIONAL REVIEW OF SYSTEMS:  ROS   PAST MEDICAL & SURGICAL HISTORY:  Past Medical History:  Diagnosis Date  . Basal cell carcinoma    basal cell -skin cancer s/p removal  . CAD S/P percutaneous coronary angioplasty 2012   Dr. Saralyn Pilar ([place], [place]): DES PCI to LAD  . DM (diabetes mellitus), type 2 with complications (HCC)    CAD  . GERD (gastroesophageal reflux disease)   . Hypercholesterolemia   . Hyperlipidemia associated with type 2 diabetes mellitus ([place])    On lovastatin   Past Surgical History:  Procedure Laterality Date  . BREAST BIOPSY Left 03/11/2019   stereo biopsy/ x clip/path pending  . BREAST RECONSTRUCTION WITH PLACEMENT OF TISSUE EXPANDER AND FLEX HD (ACELLULAR HYDRATED DERMIS) Left 05/03/2019   Procedure: LEFT BREAST RECONSTRUCTION WITH PLACEMENT OF TISSUE EXPANDER AND FLEX HD (ACELLULAR HYDRATED DERMIS);  Surgeon: Wallace Going, DO;  Location: [facility];  Service: Plastics;  Laterality: Left;  . CARDIAC CATHETERIZATION    .  CHOLECYSTECTOMY    . COLONOSCOPY    . CORONARY STENT INTERVENTION  2012   Dr. Saralyn Pilar ([place]): DES PCI pLAD  . MASTOPEXY Right 07/01/2019   Procedure: MASTOPEXY;  Surgeon: Wallace Going, DO;  Location: [place];  Service: Plastics;  Laterality: Right;  . NM GATED MYOVIEW (ARMX HX)  05/09/2017   [facility]) exercised 8:36 min with no chest pain or dyspnea.  No EKG changes.  Normal EF 66%.  No ischemia or infarction)  . REMOVAL OF TISSUE EXPANDER AND PLACEMENT OF IMPLANT Left 07/01/2019   Procedure: REMOVAL OF TISSUE EXPANDER AND PLACEMENT OF IMPLANT;  Surgeon: Wallace Going, DO;  Location: [place];  Service: Plastics;  Laterality: Left;  total case 2.5 hours  . TOTAL MASTECTOMY Left 05/03/2019   Procedure: TOTAL MASTECTOMY W/ Sentinel Node;  Surgeon: Herbert Pun, MD;  Location: [facility];  Service: General;  Laterality: Left;    SOCIAL HISTORY:  ***   CURRENT MEDICATIONS:  Current Outpatient Medications on File Prior to Visit  Medication Sig Dispense Refill  . anastrozole (ARIMIDEX) 1 MG tablet Take 1 tablet (1 mg total) by mouth daily. 90 tablet 0  . CALCIUM-MAGNESIUM-ZINC PO Take 1 tablet by mouth every evening.     . ciprofloxacin (CIPRO) 500 MG tablet Take 1 tablet (500 mg total) by mouth 2 (two) times daily. (Patient not taking: Reported on 09/28/2019) 6 tablet 0  . clopidogrel (PLAVIX) 75 MG tablet Take 75 mg by mouth daily.     . diazepam (  VALIUM) 2 MG tablet Take 1 tablet (2 mg total) by mouth every 12 (twelve) hours as needed for muscle spasms. (Patient not taking: Reported on 09/28/2019) 20 tablet 0  . glimepiride (AMARYL) 4 MG tablet Take 4 mg by mouth 2 (two) times daily.     Marland Kitchen JANUMET 50-500 MG tablet Take 1 tablet by mouth 2 (two) times daily.    Marland Kitchen JARDIANCE 25 MG TABS tablet Take 25 mg by mouth daily.     . lansoprazole (PREVACID) 15 MG capsule Take 15 mg by mouth daily.     Marland Kitchen  lovastatin (MEVACOR) 40 MG tablet Take 40 mg by mouth at bedtime.     . metFORMIN (GLUCOPHAGE) 500 MG tablet Take 500 mg by mouth 2 (two) times daily.     . Multiple Vitamin (MULTIVITAMIN) tablet Take 1 tablet by mouth daily.    . ondansetron (ZOFRAN) 4 MG tablet Take 1 tablet (4 mg total) by mouth every 8 (eight) hours as needed for nausea or vomiting. (Patient not taking: Reported on 09/28/2019) 20 tablet 0   No current facility-administered medications on file prior to visit.    ALLERGIES:  Allergies  Allergen Reactions  . Exenatide Nausea Only  . Orlistat     Other reaction(s): Unknown  . Penicillins Hives and Swelling  . Thiazide-Type Diuretics     Other reaction(s): Unknown    PHYSICAL EXAM:  Limited due to virtual platform  LABORATORY DATA:  Lab Results  Component Value Date   WBC 6.2 09/28/2019   HGB 14.1 09/28/2019   HCT 41.8 09/28/2019   MCV 89.5 09/28/2019   PLT 244 09/28/2019      Chemistry      Component Value Date/Time   NA 138 09/28/2019 1344   K 4.3 09/28/2019 1344   CL 102 09/28/2019 1344   CO2 27 09/28/2019 1344   BUN 21 09/28/2019 1344   CREATININE 0.90 09/28/2019 1344      Component Value Date/Time   CALCIUM 9.2 09/28/2019 1344   ALKPHOS 63 09/28/2019 1344   AST 29 09/28/2019 1344   ALT 24 09/28/2019 1344   BILITOT 0.6 09/28/2019 1344        DIAGNOSTIC IMAGING:  '@BIMFINDINGS' @    ASSESSMENT & PLAN:  Ms. Dulac is a pleasant 69 y.o.   with history of Stage ***, treated with ***; completed treatment on ***.  Patient presents to survivorship clinic today for survivorship care plan visit and to address any acute survivorship concerns since completing treatment.    1. History of *** cancer: Clinically, she is without evidence of disease recurrence based on physical exam/diagnostic imaging.  Today, she received a copy of her survivorship care plan (SCP) document, which was reviewed with her in detail.  The SCP details her cancer treatment  history and potential late/long-term side effects of those treatments.  We discussed the follow-up schedule she can anticipate with interval imaging for surveillance of her cancer.  I have also shared a copy of her treatment summary/SCP with her PCP.  Ms. Porcaro will return to the survivorship clinic as needed; she will return to [place] at [facility] for surveillance visit with Dr. Marland Kitchen in *** months.    2. Problem at visit: None  3. Smoking cessation: I commended Ms. Rill's continued efforts to remain tobacco-free.  We discussed that one of the most important risk reduction strategies in preventing cancer recurrence in lung cancer patients is smoking cessation.  He is committed to abstaining from tobacco.  4. Physical activity/Healthy eating: Getting adequate physical activity and maintaining a healthy diet as a cancer survivor is important for overall wellness and reduces the risk of cancer recurrence. We discussed the CARE program which is a fitness program that is offered to cancer survivors free of charge.  We also reviewed the American Cancer Society's booklet with recommendations for nutrition and physical activity.    4. Health promotion/Cancer screening:  Ms. Campanella is reportedly up-to-date on her colonoscopy, pap smear, PSA tests, skin screenings, and vaccinations.  I encouraged her to talk with his PCP about arranging appropriate cancer screening tests, as appropriate.   5. Support services/Counseling: Ms. Brandenburger was seen today in in effort to address both the physical and social concerns of our cancer survivors at [facility]. It is not uncommon for this period of the patient's cancer care trajectory to be one of many emotions and stressors.  I provided support today through active listening, validation of concerns, and expressive supportive counseling.  Ms. Pietsch was encouraged to take advantage of our support services programs and support groups to better  cope in her new life as a cancer survivor after completing anti-cancer treatment.   NCCN Guidelines and Recommendations:  NCCN guidelines recommends the following surveillance for DCIS of breast post-operatively (1.2018):  1. Interval history and physical exam every 6-12 months for 5 years, then annually.  2. Mammogram every 12 months (first mammogram 6-12 months after breast conserving therapy, category 2B).  3. If treated with Tamoxifen, monitor per NCCN guidelines for Breast Cancer Risk Reduction.  NCCN guideline recommendations for invasive breast cancer ER/PR positive HER-2/neu negative disease  1.  Interval history and physical exam 1-4 times per year as clinically appropriate for 5 years, then annually.  2.  Mammogram every 12 months.  3, endocrine therapy: Assess and encourage adherence; patients taking tamoxifen should have annual gynecological  assessment.   4.  Patient is on an AI or those who experienced ovarian failure secondary to treatment should have monitoring of bone health with a bone mineral density at baseline and periodically thereafter.  Dispo:  -RTC for follow-up with oncologist  -RTC for follow-up with radiation oncology as scheduled   A total of 30 minutes was spent in face-to-face care of this patient, with greater than 50% of that time spent in counseling and care coordination.    Rulon Abide, AGNP-C [place] at [place] (office) 10/17/19 7:34 AM

## 2019-11-02 ENCOUNTER — Ambulatory Visit (INDEPENDENT_AMBULATORY_CARE_PROVIDER_SITE_OTHER): Payer: 59 | Admitting: Plastic Surgery

## 2019-11-02 ENCOUNTER — Other Ambulatory Visit: Payer: Self-pay

## 2019-11-02 ENCOUNTER — Encounter: Payer: Self-pay | Admitting: Plastic Surgery

## 2019-11-02 ENCOUNTER — Other Ambulatory Visit (HOSPITAL_COMMUNITY)
Admission: RE | Admit: 2019-11-02 | Discharge: 2019-11-02 | Disposition: A | Discharge status: Home / Self Care | Payer: 59 | Source: Ambulatory Visit | Attending: Plastic Surgery | Admitting: Plastic Surgery

## 2019-11-02 VITALS — BP 112/69 | HR 82 | Temp 98.1°F

## 2019-11-02 DIAGNOSIS — C44529 Squamous cell carcinoma of skin of other part of trunk: Secondary | ICD-10-CM | POA: Diagnosis not present

## 2019-11-02 NOTE — Progress Notes (Signed)
Procedure Note  Preoperative Dx: Squamous cell carcinoma chest on the right  Postoperative Dx: Same  Procedure: Reexcision of squamous cell carcinoma with close margins 1 x 3 cm  Anesthesia: Lidocaine 1% with 1:100,000 epinepherine  Indication for Procedure: Squamous cell carcinoma  Description of Procedure: Risks and complications were explained to the patient.  Consent was confirmed and the patient understands the risks and benefits.  The potential complications and alternatives were explained and the patient consents.  The patient expressed understanding the option of not having the procedure and the risks of a scar.  Time out was called and all information was confirmed to be correct.    The area was prepped and drapped.  Lidocaine 1% with epinepherine was injected in the subcutaneous area.  After waiting several minutes for the local to take affect a #15 blade was used to excise the area in an eliptical pattern with 5 mm borders from the incision.  A 5-0 Monocryl was used to close the deep layers with simple interrupted stitches.  The skin edges were reapproximated with 5-0 Monocryl subcuticular running closure.  A dressing was applied.  The patient was given instructions on how to care for the area and a follow up appointment.  Perline tolerated the procedure well and there were no complications. The specimen was sent to pathology.

## 2019-11-04 LAB — SURGICAL PATHOLOGY

## 2019-11-08 ENCOUNTER — Other Ambulatory Visit: Payer: Self-pay | Admitting: *Deleted

## 2019-11-08 MED ORDER — ANASTROZOLE 1 MG PO TABS: 1.0000 mg | ORAL_TABLET | Freq: Every day | ORAL | 0 refills | Status: DC

## 2019-11-15 ENCOUNTER — Encounter: Payer: Self-pay | Admitting: Plastic Surgery

## 2019-11-15 NOTE — Progress Notes (Signed)
The patient is a 69 year old female who underwent excision of a lesion on her chest.  Pathology showed a squamous cell carcinoma.  Margins are clear.  There is no sign of infection.  The sutures were removed and a fresh Steri-Strip was applied.  Patient also had a left breast reconstruction for breast cancer with a right mastopexy.  She is unhappy with the way that it looks.  She has some volume loss on the medial aspect of the left breast in the superior medial aspect.  She has some volume loss on the superior lateral aspect of the left breast.  Then there is some wrinkling on the lateral portion of the left breast.  Overall she would do very well for fat filling for improved symmetry.  She would also do very well with the nipple areolar tattoo on the left breast.  She is agreeable to both.  Plan for nipple areolar tattoo of the left breast with [facility] in the office around January Recommend fat filling of the left breast for improved symmetry.  Pictures were obtained of the patient and placed in the chart with the patient's or guardian's permission.

## 2019-11-16 ENCOUNTER — Encounter: Payer: Self-pay | Admitting: Plastic Surgery

## 2019-11-16 ENCOUNTER — Other Ambulatory Visit: Payer: Self-pay

## 2019-11-16 ENCOUNTER — Ambulatory Visit (INDEPENDENT_AMBULATORY_CARE_PROVIDER_SITE_OTHER): Payer: 59 | Admitting: Plastic Surgery

## 2019-11-16 VITALS — BP 122/80 | HR 76 | Temp 98.2°F

## 2019-11-16 DIAGNOSIS — C44521 Squamous cell carcinoma of skin of breast: Secondary | ICD-10-CM

## 2019-11-16 DIAGNOSIS — L989 Disorder of the skin and subcutaneous tissue, unspecified: Secondary | ICD-10-CM

## 2019-11-25 ENCOUNTER — Encounter: Payer: Self-pay | Admitting: Oncology

## 2019-11-25 ENCOUNTER — Encounter: Payer: Self-pay | Admitting: Plastic Surgery

## 2020-01-10 NOTE — Progress Notes (Signed)
Patient ID: Tammy Elliott, female    DOB: 09-17-1950, 69 y.o.   MRN: [id number]  Chief Complaint  Patient presents with  . Pre-op Exam      ICD-10-CM   1. S/P mastectomy, left  Z90.12   2. Malignant neoplasm of upper-outer quadrant of left breast in female, estrogen receptor positive ([place])  C50.412    Z17.0     History of Present Illness: Tammy Elliott is a 69 y.o.  female  with a history of left breast cancer.  She presents for preoperative evaluation for upcoming procedure, liposuction for lipofilling of left breast, scheduled for 01/27/20 with Dr. Marla Elliott  The patient has not had problems with anesthesia. No history of DVT/PE.  No family history of DVT/PE.  No family or personal history of bleeding or clotting disorders.  PMH Significant for: left breast cancer, currently on anastrazole. Hx of DM2 (most recent a1c 7.0 5/21), GERD, CAD s/p stent in LAD 2012 on plavix. Hx of HLD.   Patient reports he has been feeling well lately.  She does report that she feels as if she may have a UTI and is going to see her PCP for evaluation of this  Past Medical History: Allergies: Allergies  Allergen Reactions  . Exenatide Nausea Only  . Orlistat     Other reaction(s): Unknown  . Penicillins Hives and Swelling  . Thiazide-Type Diuretics     Other reaction(s): Unknown    Current Medications:  Current Outpatient Medications:  .  anastrozole (ARIMIDEX) 1 MG tablet, Take 1 tablet (1 mg total) by mouth daily., Disp: 90 tablet, Rfl: 0 .  CALCIUM-MAGNESIUM-ZINC PO, Take 1 tablet by mouth every evening. , Disp: , Rfl:  .  clopidogrel (PLAVIX) 75 MG tablet, Take 75 mg by mouth daily. , Disp: , Rfl:  .  glimepiride (AMARYL) 4 MG tablet, Take 4 mg by mouth 2 (two) times daily. , Disp: , Rfl:  .  JANUMET 50-500 MG tablet, Take 1 tablet by mouth 2 (two) times daily., Disp: , Rfl:  .  JARDIANCE 25 MG TABS tablet, Take 25 mg by mouth daily. , Disp: , Rfl:  .  lansoprazole (PREVACID) 15 MG  capsule, Take 15 mg by mouth daily. , Disp: , Rfl:  .  lovastatin (MEVACOR) 40 MG tablet, Take 40 mg by mouth at bedtime. , Disp: , Rfl:  .  metFORMIN (GLUCOPHAGE) 500 MG tablet, Take 500 mg by mouth 2 (two) times daily. , Disp: , Rfl:  .  Multiple Vitamin (MULTIVITAMIN) tablet, Take 1 tablet by mouth daily., Disp: , Rfl:  .  Vitamin D, Cholecalciferol, 25 MCG (1000 UT) TABS, Take by mouth., Disp: , Rfl:  .  diazepam (VALIUM) 2 MG tablet, Take 1 tablet (2 mg total) by mouth every 12 (twelve) hours as needed for muscle spasms. (Patient not taking: Reported on 01/11/2020), Disp: 20 tablet, Rfl: 0 .  ondansetron (ZOFRAN) 4 MG tablet, Take 1 tablet (4 mg total) by mouth every 8 (eight) hours as needed for nausea or vomiting. (Patient not taking: Reported on 01/11/2020), Disp: 20 tablet, Rfl: 0  Past Medical Problems: Past Medical History:  Diagnosis Date  . Basal cell carcinoma    basal cell -skin cancer s/p removal  . CAD S/P percutaneous coronary angioplasty 2012   Dr. Saralyn Pilar ([place], [place]): DES PCI to LAD  . DM (diabetes mellitus), type 2 with complications ([facility])    CAD  . GERD (gastroesophageal reflux disease)   .  Hypercholesterolemia   . Hyperlipidemia associated with type 2 diabetes mellitus ([place])    On lovastatin    Past Surgical History: Past Surgical History:  Procedure Laterality Date  . BREAST BIOPSY Left 03/11/2019   stereo biopsy/ x clip/path pending  . BREAST RECONSTRUCTION WITH PLACEMENT OF TISSUE EXPANDER AND FLEX HD (ACELLULAR HYDRATED DERMIS) Left 05/03/2019   Procedure: LEFT BREAST RECONSTRUCTION WITH PLACEMENT OF TISSUE EXPANDER AND FLEX HD (ACELLULAR HYDRATED DERMIS);  Surgeon: Wallace Going, DO;  Location: [facility];  Service: Plastics;  Laterality: Left;  . CARDIAC CATHETERIZATION    . CHOLECYSTECTOMY    . COLONOSCOPY    . CORONARY STENT INTERVENTION  2012   Dr. Saralyn Pilar ([place]): DES PCI pLAD  . MASTOPEXY Right  07/01/2019   Procedure: MASTOPEXY;  Surgeon: Wallace Going, DO;  Location: [place];  Service: Plastics;  Laterality: Right;  . NM GATED MYOVIEW (ARMX HX)  05/09/2017   [facility]) exercised 8:36 min with no chest pain or dyspnea.  No EKG changes.  Normal EF 66%.  No ischemia or infarction)  . REMOVAL OF TISSUE EXPANDER AND PLACEMENT OF IMPLANT Left 07/01/2019   Procedure: REMOVAL OF TISSUE EXPANDER AND PLACEMENT OF IMPLANT;  Surgeon: Wallace Going, DO;  Location: [place];  Service: Plastics;  Laterality: Left;  total case 2.5 hours  . TOTAL MASTECTOMY Left 05/03/2019   Procedure: TOTAL MASTECTOMY W/ Sentinel Node;  Surgeon: Herbert Pun, MD;  Location: [facility];  Service: General;  Laterality: Left;    Social History: Social History   Socioeconomic History  . Marital status: Married    Spouse name: Not on file  . Number of children: Not on file  . Years of education: Not on file  . Highest education level: Not on file  Occupational History  . Occupation: Printmaker: [employer]  Tobacco Use  . Smoking status: Never Smoker  . Smokeless tobacco: Never Used  Vaping Use  . Vaping Use: Never used  Substance and Sexual Activity  . Alcohol use: Not Currently  . Drug use: Never  . Sexual activity: Not on file  Other Topics Concern  . Not on file  Social History Narrative   reports that she has never smoked. She has never used smokeless tobacco. She reports that she rarely drinks alcohol. She reports that she does not use drugs.      Married to Tammy Elliott ([name]) Tammy Elliott    Social Determinants of Health   Financial Resource Strain:   . Difficulty of Paying Living Expenses: Not on file  Food Insecurity:   . Worried About Charity fundraiser in the Last Year: Not on file  . Ran Out of Food in the Last Year: Not on file  Transportation Needs:   . Lack of Transportation (Medical): Not on file  . Lack of  Transportation (Non-Medical): Not on file  Physical Activity:   . Days of Exercise per Week: Not on file  . Minutes of Exercise per Session: Not on file  Stress:   . Feeling of Stress : Not on file  Social Connections:   . Frequency of Communication with Friends and Family: Not on file  . Frequency of Social Gatherings with Friends and Family: Not on file  . Attends Religious Services: Not on file  . Active Member of Clubs or Organizations: Not on file  . Attends Archivist Meetings: Not on file  . Marital  Status: Not on file  Intimate Partner Violence:   . Fear of Current or Ex-Partner: Not on file  . Emotionally Abused: Not on file  . Physically Abused: Not on file  . Sexually Abused: Not on file    Family History: Family History  Problem Relation Age of Onset  . CAD Mother   . Diabetes Mellitus II Mother   . Hypertension Mother   . Uterine cancer Mother   . CAD Father   . Heart attack Father   . Hypertension Father   . Diabetes Mellitus II Father        Took insulin  . Alcohol abuse Father   . Diabetes Mellitus II Sister   . Diabetes Other        Almost all sisters are diabetic or pre-diabetic   . Breast cancer Neg Hx     Review of Systems: Review of Systems  Constitutional: Negative for chills, diaphoresis and fever.  Respiratory: Negative for shortness of breath.   Cardiovascular: Negative for chest pain, palpitations and leg swelling.  Gastrointestinal: Negative for nausea and vomiting.    Physical Exam: Vital Signs BP 124/73 (BP Location: Right Arm, Patient Position: Sitting, Cuff Size: Normal)   Pulse 72   Temp 98.2 F (36.8 C) (Oral)   Ht 5\' 8"  (1.727 m)   Wt 181 lb 12.8 oz (82.5 kg)   SpO2 99%   BMI 27.64 kg/m  Physical Exam Constitutional:      General: She is not in acute distress.    Appearance: Normal appearance. She is not ill-appearing.  HENT:     Head: Normocephalic and atraumatic.  Eyes:     Pupils: Pupils are equal,  round Neck:     Musculoskeletal: Normal range of motion.  Cardiovascular:     Rate and Rhythm: Normal rate and regular rhythm.     Pulses: Normal pulses.     Heart sounds: Normal heart sounds. No murmur.  Pulmonary:     Effort: Pulmonary effort is normal. No respiratory distress.     Breath sounds: Normal breath sounds. No wheezing.  Abdominal:     General: Abdomen is flat. There is no distension.     Palpations: Abdomen is soft.     Tenderness: There is no abdominal tenderness.  Musculoskeletal: Normal range of motion.  Skin:    General: Skin is warm and dry.     Findings: No erythema or rash.  Neurological:     General: No focal deficit present.     Mental Status: She is alert and oriented to person, place, and time. Mental status is at baseline.     Motor: No weakness.  Psychiatric:        Mood and Affect: Mood normal.        Behavior: Behavior normal.     Assessment/Plan: The patient is scheduled for left breast lipofilling and liposuction of abdomen with Dr. Marla Elliott.  Risks, benefits, and alternatives of procedure discussed, questions answered and consent obtained.    Smoking Status: non smoker  Caprini Score: 8, high; Risk Factors include: age, BMI > 25, on anastrazole, Hx of cancer and length of planned surgery. Recommendation for mechanical and pharmacological prophylaxis. Encourage early ambulation.  Currently on Plavix, has previously held 5 days prior to surgery, will hold for 5 days again and restart 24 hrs postop  Pictures obtained: 11/16/19  Post-op Rx sent to pharmacy: Doxy x5 days.  Patient has previous prescriptions of Norco, Zofran from previous surgery.  Patient  was provided with the General Surgical Risk consent document and Pain Medication Agreement prior to their appointment.  They had adequate time to read through the risk consent documents and Pain Medication Agreement. We also discussed them in person together during this preop appointment. All of  their questions were answered to their satisfaction.  Recommended calling if they have any further questions.  Risk consent form and Pain Medication Agreement to be scanned into patient's chart.  The risks that can be encountered with and after liposuction were discussed and include the following but no limited to these:  Asymmetry, fluid accumulation, firmness of the area, fat necrosis with death of fat tissue, bleeding, infection, delayed healing, anesthesia risks, skin sensation changes, injury to structures including nerves, blood vessels, and muscles which may be temporary or permanent, allergies to tape, suture materials and glues, blood products, topical preparations or injected agents, skin and contour irregularities, skin discoloration and swelling, deep vein thrombosis, cardiac and pulmonary complications, pain, which may persist, persistent pain, recurrence of the lesion, poor healing of the incision, possible need for revisional surgery or staged procedures. Thiere can also be persistent swelling, poor wound healing, rippling or loose skin, worsening of cellulite, swelling, and thermal burn or heat injury from ultrasound with the ultrasound-assisted lipoplasty technique. Any change in weight fluctuations can alter the outcome.    Electronically signed by: Carola Rhine Connell Bognar, PA-C 01/11/2020 8:19 AM

## 2020-01-10 NOTE — H&P (View-Only) (Signed)
Patient ID: Tammy Elliott, female    DOB: 10/19/1950, 69 y.o.   MRN: [id number]  Chief Complaint  Patient presents with  . Pre-op Exam      ICD-10-CM   1. S/P mastectomy, left  Z90.12   2. Malignant neoplasm of upper-outer quadrant of left breast in female, estrogen receptor positive ([place] [place])  C50.412    Z17.0     History of Present Illness: Tammy Elliott is a 69 y.o.  female  with a history of left breast cancer.  She presents for preoperative evaluation for upcoming procedure, liposuction for lipofilling of left breast, scheduled for 01/27/20 with Dr. Marla Roe  The patient has not had problems with anesthesia. No history of DVT/PE.  No family history of DVT/PE.  No family or personal history of bleeding or clotting disorders.  PMH Significant for: left breast cancer, currently on anastrazole. Hx of DM2 (most recent a1c 7.0 5/21), GERD, CAD s/p stent in LAD 2012 on plavix. Hx of HLD.   Patient reports he has been feeling well lately.  She does report that she feels as if she may have a UTI and is going to see her PCP for evaluation of this  Past Medical History: Allergies: Allergies  Allergen Reactions  . Exenatide Nausea Only  . Orlistat     Other reaction(s): Unknown  . Penicillins Hives and Swelling  . Thiazide-Type Diuretics     Other reaction(s): Unknown    Current Medications:  Current Outpatient Medications:  .  anastrozole (ARIMIDEX) 1 MG tablet, Take 1 tablet (1 mg total) by mouth daily., Disp: 90 tablet, Rfl: 0 .  CALCIUM-MAGNESIUM-ZINC PO, Take 1 tablet by mouth every evening. , Disp: , Rfl:  .  clopidogrel (PLAVIX) 75 MG tablet, Take 75 mg by mouth daily. , Disp: , Rfl:  .  glimepiride (AMARYL) 4 MG tablet, Take 4 mg by mouth 2 (two) times daily. , Disp: , Rfl:  .  JANUMET 50-500 MG tablet, Take 1 tablet by mouth 2 (two) times daily., Disp: , Rfl:  .  JARDIANCE 25 MG TABS tablet, Take 25 mg by mouth daily. , Disp: , Rfl:  .  lansoprazole (PREVACID) 15 MG  capsule, Take 15 mg by mouth daily. , Disp: , Rfl:  .  lovastatin (MEVACOR) 40 MG tablet, Take 40 mg by mouth at bedtime. , Disp: , Rfl:  .  metFORMIN (GLUCOPHAGE) 500 MG tablet, Take 500 mg by mouth 2 (two) times daily. , Disp: , Rfl:  .  Multiple Vitamin (MULTIVITAMIN) tablet, Take 1 tablet by mouth daily., Disp: , Rfl:  .  Vitamin D, Cholecalciferol, 25 MCG (1000 UT) TABS, Take by mouth., Disp: , Rfl:  .  diazepam (VALIUM) 2 MG tablet, Take 1 tablet (2 mg total) by mouth every 12 (twelve) hours as needed for muscle spasms. (Patient not taking: Reported on 01/11/2020), Disp: 20 tablet, Rfl: 0 .  ondansetron (ZOFRAN) 4 MG tablet, Take 1 tablet (4 mg total) by mouth every 8 (eight) hours as needed for nausea or vomiting. (Patient not taking: Reported on 01/11/2020), Disp: 20 tablet, Rfl: 0  Past Medical Problems: Past Medical History:  Diagnosis Date  . Basal cell carcinoma    basal cell -skin cancer s/p removal  . CAD S/P percutaneous coronary angioplasty 2012   Dr. Saralyn Pilar ([place], [place]): DES PCI to LAD  . DM (diabetes mellitus), type 2 with complications ([facility])    CAD  . GERD (gastroesophageal reflux disease)   .  Hypercholesterolemia   . Hyperlipidemia associated with type 2 diabetes mellitus ([place])    On lovastatin    Past Surgical History: Past Surgical History:  Procedure Laterality Date  . BREAST BIOPSY Left 03/11/2019   stereo biopsy/ x clip/path pending  . BREAST RECONSTRUCTION WITH PLACEMENT OF TISSUE EXPANDER AND FLEX HD (ACELLULAR HYDRATED DERMIS) Left 05/03/2019   Procedure: LEFT BREAST RECONSTRUCTION WITH PLACEMENT OF TISSUE EXPANDER AND FLEX HD (ACELLULAR HYDRATED DERMIS);  Surgeon: Wallace Going, DO;  Location: [facility];  Service: Plastics;  Laterality: Left;  . CARDIAC CATHETERIZATION    . CHOLECYSTECTOMY    . COLONOSCOPY    . CORONARY STENT INTERVENTION  2012   Dr. Saralyn Pilar ([place]): DES PCI pLAD  . MASTOPEXY Right  07/01/2019   Procedure: MASTOPEXY;  Surgeon: Wallace Going, DO;  Location: [facility];  Service: Plastics;  Laterality: Right;  . NM GATED MYOVIEW (ARMX HX)  05/09/2017   [facility]) exercised 8:36 min with no chest pain or dyspnea.  No EKG changes.  Normal EF 66%.  No ischemia or infarction)  . REMOVAL OF TISSUE EXPANDER AND PLACEMENT OF IMPLANT Left 07/01/2019   Procedure: REMOVAL OF TISSUE EXPANDER AND PLACEMENT OF IMPLANT;  Surgeon: Wallace Going, DO;  Location: [place];  Service: Plastics;  Laterality: Left;  total case 2.5 hours  . TOTAL MASTECTOMY Left 05/03/2019   Procedure: TOTAL MASTECTOMY W/ Sentinel Node;  Surgeon: Herbert Pun, MD;  Location: [facility];  Service: General;  Laterality: Left;    Social History: Social History   Socioeconomic History  . Marital status: Married    Spouse name: Not on file  . Number of children: Not on file  . Years of education: Not on file  . Highest education level: Not on file  Occupational History  . Occupation: Printmaker: [employer]  Tobacco Use  . Smoking status: Never Smoker  . Smokeless tobacco: Never Used  Vaping Use  . Vaping Use: Never used  Substance and Sexual Activity  . Alcohol use: Not Currently  . Drug use: Never  . Sexual activity: Not on file  Other Topics Concern  . Not on file  Social History Narrative   reports that she has never smoked. She has never used smokeless tobacco. She reports that she rarely drinks alcohol. She reports that she does not use drugs.      Married to [name] ([name]) [name]    Social Determinants of Health   Financial Resource Strain:   . Difficulty of Paying Living Expenses: Not on file  Food Insecurity:   . Worried About Charity fundraiser in the Last Year: Not on file  . Ran Out of Food in the Last Year: Not on file  Transportation Needs:   . Lack of Transportation (Medical): Not on file  . Lack of  Transportation (Non-Medical): Not on file  Physical Activity:   . Days of Exercise per Week: Not on file  . Minutes of Exercise per Session: Not on file  Stress:   . Feeling of Stress : Not on file  Social Connections:   . Frequency of Communication with Friends and Family: Not on file  . Frequency of Social Gatherings with Friends and Family: Not on file  . Attends Religious Services: Not on file  . Active Member of Clubs or Organizations: Not on file  . Attends Archivist Meetings: Not on file  . Marital  Status: Not on file  Intimate Partner Violence:   . Fear of Current or Ex-Partner: Not on file  . Emotionally Abused: Not on file  . Physically Abused: Not on file  . Sexually Abused: Not on file    Family History: Family History  Problem Relation Age of Onset  . CAD Mother   . Diabetes Mellitus II Mother   . Hypertension Mother   . Uterine cancer Mother   . CAD Father   . Heart attack Father   . Hypertension Father   . Diabetes Mellitus II Father        Took insulin  . Alcohol abuse Father   . Diabetes Mellitus II Sister   . Diabetes Other        Almost all sisters are diabetic or pre-diabetic   . Breast cancer Neg Hx     Review of Systems: Review of Systems  Constitutional: Negative for chills, diaphoresis and fever.  Respiratory: Negative for shortness of breath.   Cardiovascular: Negative for chest pain, palpitations and leg swelling.  Gastrointestinal: Negative for nausea and vomiting.    Physical Exam: Vital Signs BP 124/73 (BP Location: Right Arm, Patient Position: Sitting, Cuff Size: Normal)   Pulse 72   Temp 98.2 F (36.8 C) (Oral)   Ht 5\' 8"  (1.727 m)   Wt 181 lb 12.8 oz (82.5 kg)   SpO2 99%   BMI 27.64 kg/m  Physical Exam Constitutional:      General: She is not in acute distress.    Appearance: Normal appearance. She is not ill-appearing.  HENT:     Head: Normocephalic and atraumatic.  Eyes:     Pupils: Pupils are equal,  round Neck:     Musculoskeletal: Normal range of motion.  Cardiovascular:     Rate and Rhythm: Normal rate and regular rhythm.     Pulses: Normal pulses.     Heart sounds: Normal heart sounds. No murmur.  Pulmonary:     Effort: Pulmonary effort is normal. No respiratory distress.     Breath sounds: Normal breath sounds. No wheezing.  Abdominal:     General: Abdomen is flat. There is no distension.     Palpations: Abdomen is soft.     Tenderness: There is no abdominal tenderness.  Musculoskeletal: Normal range of motion.  Skin:    General: Skin is warm and dry.     Findings: No erythema or rash.  Neurological:     General: No focal deficit present.     Mental Status: She is alert and oriented to person, place, and time. Mental status is at baseline.     Motor: No weakness.  Psychiatric:        Mood and Affect: Mood normal.        Behavior: Behavior normal.     Assessment/Plan: The patient is scheduled for left breast lipofilling and liposuction of abdomen with Dr. Marla Roe.  Risks, benefits, and alternatives of procedure discussed, questions answered and consent obtained.    Smoking Status: non smoker  Caprini Score: 8, high; Risk Factors include: age, BMI > 25, on anastrazole, Hx of cancer and length of planned surgery. Recommendation for mechanical and pharmacological prophylaxis. Encourage early ambulation.  Currently on Plavix, has previously held 5 days prior to surgery, will hold for 5 days again and restart 24 hrs postop  Pictures obtained: 11/16/19  Post-op Rx sent to pharmacy: Doxy x5 days.  Patient has previous prescriptions of Norco, Zofran from previous surgery.  Patient  was provided with the General Surgical Risk consent document and Pain Medication Agreement prior to their appointment.  They had adequate time to read through the risk consent documents and Pain Medication Agreement. We also discussed them in person together during this preop appointment. All of  their questions were answered to their satisfaction.  Recommended calling if they have any further questions.  Risk consent form and Pain Medication Agreement to be scanned into patient's chart.  The risks that can be encountered with and after liposuction were discussed and include the following but no limited to these:  Asymmetry, fluid accumulation, firmness of the area, fat necrosis with death of fat tissue, bleeding, infection, delayed healing, anesthesia risks, skin sensation changes, injury to structures including nerves, blood vessels, and muscles which may be temporary or permanent, allergies to tape, suture materials and glues, blood products, topical preparations or injected agents, skin and contour irregularities, skin discoloration and swelling, deep vein thrombosis, cardiac and pulmonary complications, pain, which may persist, persistent pain, recurrence of the lesion, poor healing of the incision, possible need for revisional surgery or staged procedures. Thiere can also be persistent swelling, poor wound healing, rippling or loose skin, worsening of cellulite, swelling, and thermal burn or heat injury from ultrasound with the ultrasound-assisted lipoplasty technique. Any change in weight fluctuations can alter the outcome.    Electronically signed by: Carola Rhine Detrell Umscheid, PA-C 01/11/2020 8:19 AM

## 2020-01-11 ENCOUNTER — Encounter: Payer: Self-pay | Admitting: Surgical

## 2020-01-11 ENCOUNTER — Ambulatory Visit (INDEPENDENT_AMBULATORY_CARE_PROVIDER_SITE_OTHER): Payer: 59 | Admitting: Surgical

## 2020-01-11 ENCOUNTER — Other Ambulatory Visit: Payer: Self-pay

## 2020-01-11 VITALS — BP 124/73 | HR 72 | Temp 98.2°F | Ht 68.0 in | Wt 181.8 lb

## 2020-01-11 DIAGNOSIS — C50412 Malignant neoplasm of upper-outer quadrant of left female breast: Secondary | ICD-10-CM

## 2020-01-11 DIAGNOSIS — Z170 Estrogen receptor positive status [ER+]: Secondary | ICD-10-CM

## 2020-01-11 DIAGNOSIS — Z9012 Acquired absence of left breast and nipple: Secondary | ICD-10-CM

## 2020-01-11 MED ORDER — DOXYCYCLINE HYCLATE 100 MG PO TABS: 100.0000 mg | ORAL_TABLET | Freq: Two times a day (BID) | ORAL | 0 refills | Status: AC

## 2020-01-19 ENCOUNTER — Other Ambulatory Visit (HOSPITAL_COMMUNITY): Payer: Self-pay | Admitting: Internal Medicine

## 2020-01-19 ENCOUNTER — Other Ambulatory Visit: Payer: Self-pay | Admitting: Internal Medicine

## 2020-01-19 DIAGNOSIS — R1013 Epigastric pain: Secondary | ICD-10-CM

## 2020-01-20 ENCOUNTER — Encounter (HOSPITAL_BASED_OUTPATIENT_CLINIC_OR_DEPARTMENT_OTHER): Payer: Self-pay | Admitting: Plastic Surgery

## 2020-01-20 ENCOUNTER — Other Ambulatory Visit: Payer: Self-pay

## 2020-01-24 ENCOUNTER — Other Ambulatory Visit (HOSPITAL_COMMUNITY)
Admission: RE | Admit: 2020-01-24 | Discharge: 2020-01-24 | Disposition: A | Discharge status: Home / Self Care | Payer: 59 | Source: Ambulatory Visit | Attending: Plastic Surgery | Admitting: Plastic Surgery

## 2020-01-24 ENCOUNTER — Encounter (HOSPITAL_BASED_OUTPATIENT_CLINIC_OR_DEPARTMENT_OTHER)
Admission: RE | Admit: 2020-01-24 | Discharge: 2020-01-24 | Disposition: A | Discharge status: Home / Self Care | Payer: 59 | Source: Ambulatory Visit | Attending: Plastic Surgery | Admitting: Plastic Surgery

## 2020-01-24 DIAGNOSIS — Z01812 Encounter for preprocedural laboratory examination: Secondary | ICD-10-CM | POA: Insufficient documentation

## 2020-01-24 DIAGNOSIS — Z20822 Contact with and (suspected) exposure to covid-19: Secondary | ICD-10-CM | POA: Diagnosis not present

## 2020-01-24 LAB — BASIC METABOLIC PANEL
Anion gap: 12 (ref 5–15)
BUN: 15 mg/dL (ref 8–23)
CO2: 24 mmol/L (ref 22–32)
Calcium: 9.6 mg/dL (ref 8.9–10.3)
Chloride: 102 mmol/L (ref 98–111)
Creatinine, Ser: 0.85 mg/dL (ref 0.44–1.00)
GFR, Estimated: >60 mL/min (ref >60)
Glucose, Bld: 135 mg/dL — ABNORMAL HIGH (ref 70–99)
Potassium: 4.7 mmol/L (ref 3.5–5.1)
Sodium: 138 mmol/L (ref 135–145)

## 2020-01-24 LAB — SARS CORONAVIRUS 2 (TAT 6-24 HRS): SARS Coronavirus 2: NEGATIVE

## 2020-01-27 ENCOUNTER — Encounter (HOSPITAL_BASED_OUTPATIENT_CLINIC_OR_DEPARTMENT_OTHER): Payer: Self-pay | Admitting: Plastic Surgery

## 2020-01-27 ENCOUNTER — Ambulatory Visit (HOSPITAL_BASED_OUTPATIENT_CLINIC_OR_DEPARTMENT_OTHER): Payer: 59 | Admitting: Anesthesiology

## 2020-01-27 ENCOUNTER — Encounter (HOSPITAL_BASED_OUTPATIENT_CLINIC_OR_DEPARTMENT_OTHER)
Admission: RE | Disposition: A | Discharge status: Home / Self Care | Payer: Self-pay | Source: Home / Self Care | Attending: Plastic Surgery

## 2020-01-27 ENCOUNTER — Other Ambulatory Visit: Payer: Self-pay

## 2020-01-27 ENCOUNTER — Ambulatory Visit (HOSPITAL_BASED_OUTPATIENT_CLINIC_OR_DEPARTMENT_OTHER)
Admission: RE | Admit: 2020-01-27 | Discharge: 2020-01-27 | Disposition: A | Discharge status: Home / Self Care | Payer: 59 | Attending: Plastic Surgery | Admitting: Plastic Surgery

## 2020-01-27 DIAGNOSIS — N6489 Other specified disorders of breast: Secondary | ICD-10-CM | POA: Insufficient documentation

## 2020-01-27 DIAGNOSIS — Z9012 Acquired absence of left breast and nipple: Secondary | ICD-10-CM | POA: Diagnosis not present

## 2020-01-27 DIAGNOSIS — Z888 Allergy status to other drugs, medicaments and biological substances status: Secondary | ICD-10-CM | POA: Diagnosis not present

## 2020-01-27 DIAGNOSIS — Z880 Allergy status to penicillin: Secondary | ICD-10-CM | POA: Insufficient documentation

## 2020-01-27 DIAGNOSIS — Z955 Presence of coronary angioplasty implant and graft: Secondary | ICD-10-CM | POA: Diagnosis not present

## 2020-01-27 DIAGNOSIS — Z7984 Long term (current) use of oral hypoglycemic drugs: Secondary | ICD-10-CM | POA: Insufficient documentation

## 2020-01-27 DIAGNOSIS — Z853 Personal history of malignant neoplasm of breast: Secondary | ICD-10-CM | POA: Insufficient documentation

## 2020-01-27 DIAGNOSIS — Z7902 Long term (current) use of antithrombotics/antiplatelets: Secondary | ICD-10-CM | POA: Diagnosis not present

## 2020-01-27 DIAGNOSIS — Z79899 Other long term (current) drug therapy: Secondary | ICD-10-CM | POA: Insufficient documentation

## 2020-01-27 HISTORY — PX: LIPOSUCTION WITH LIPOFILLING: SHX6436

## 2020-01-27 LAB — GLUCOSE, CAPILLARY: Glucose-Capillary: 165 mg/dL — ABNORMAL HIGH (ref 70–99)

## 2020-01-27 SURGERY — LIPOSUCTION, WITH FAT TRANSFER
Anesthesia: General | Site: Breast | Laterality: Bilateral

## 2020-01-27 MED ORDER — FENTANYL CITRATE (PF) 100 MCG/2ML IJ SOLN
INTRAMUSCULAR | Status: AC
  Filled 2020-01-27: qty 2

## 2020-01-27 MED ORDER — OXYCODONE HCL 5 MG PO TABS: 5.0000 mg | ORAL_TABLET | Freq: Once | ORAL | Status: DC | PRN

## 2020-01-27 MED ORDER — EPHEDRINE SULFATE 50 MG/ML IJ SOLN: INTRAMUSCULAR | Status: DC | PRN

## 2020-01-27 MED ORDER — OXYCODONE HCL 5 MG/5ML PO SOLN: 5.0000 mg | Freq: Once | ORAL | Status: DC | PRN

## 2020-01-27 MED ORDER — SODIUM CHLORIDE 0.9 % IV SOLN: 250.0000 mL | INTRAVENOUS | Status: DC | PRN

## 2020-01-27 MED ORDER — MORPHINE SULFATE (PF) 4 MG/ML IV SOLN: 2.0000 mg | INTRAVENOUS | Status: DC | PRN

## 2020-01-27 MED ORDER — BUPIVACAINE HCL (PF) 0.25 % IJ SOLN
INTRAMUSCULAR | Status: DC | PRN
  Administered 2020-01-27: 30 mL

## 2020-01-27 MED ORDER — CHLORHEXIDINE GLUCONATE CLOTH 2 % EX PADS: 6.0000 | MEDICATED_PAD | Freq: Once | CUTANEOUS | Status: DC

## 2020-01-27 MED ORDER — LIDOCAINE 2% (20 MG/ML) 5 ML SYRINGE
INTRAMUSCULAR | Status: AC
  Filled 2020-01-27: qty 5

## 2020-01-27 MED ORDER — FENTANYL CITRATE (PF) 100 MCG/2ML IJ SOLN: 25.0000 ug | INTRAMUSCULAR | Status: DC | PRN

## 2020-01-27 MED ORDER — SUCCINYLCHOLINE CHLORIDE 200 MG/10ML IV SOSY
PREFILLED_SYRINGE | INTRAVENOUS | Status: AC
  Filled 2020-01-27: qty 10

## 2020-01-27 MED ORDER — LACTATED RINGERS IV SOLN: INTRAVENOUS | Status: DC

## 2020-01-27 MED ORDER — CIPROFLOXACIN IN D5W 400 MG/200ML IV SOLN
INTRAVENOUS | Status: DC | PRN
  Administered 2020-01-27: 400 mg via INTRAVENOUS

## 2020-01-27 MED ORDER — OXYCODONE HCL 5 MG PO TABS: 5.0000 mg | ORAL_TABLET | ORAL | Status: DC | PRN

## 2020-01-27 MED ORDER — EPINEPHRINE PF 1 MG/ML IJ SOLN
INTRAMUSCULAR | Status: AC
  Filled 2020-01-27: qty 1

## 2020-01-27 MED ORDER — SODIUM CHLORIDE 0.9% FLUSH: 3.0000 mL | INTRAVENOUS | Status: DC | PRN

## 2020-01-27 MED ORDER — LIDOCAINE HCL (CARDIAC) PF 100 MG/5ML IV SOSY
PREFILLED_SYRINGE | INTRAVENOUS | Status: DC | PRN
  Administered 2020-01-27: 50 mg via INTRAVENOUS

## 2020-01-27 MED ORDER — ACETAMINOPHEN 325 MG PO TABS: 650.0000 mg | ORAL_TABLET | ORAL | Status: DC | PRN

## 2020-01-27 MED ORDER — DIPHENHYDRAMINE HCL 50 MG/ML IJ SOLN
INTRAMUSCULAR | Status: DC | PRN
  Administered 2020-01-27: 6.25 mg via INTRAVENOUS

## 2020-01-27 MED ORDER — LIDOCAINE HCL 1 % IJ SOLN
INTRAVENOUS | Status: DC | PRN
  Administered 2020-01-27: 700 mL

## 2020-01-27 MED ORDER — MIDAZOLAM HCL 2 MG/2ML IJ SOLN
INTRAMUSCULAR | Status: AC
  Filled 2020-01-27: qty 2

## 2020-01-27 MED ORDER — CIPROFLOXACIN IN D5W 400 MG/200ML IV SOLN
INTRAVENOUS | Status: AC
  Filled 2020-01-27: qty 200

## 2020-01-27 MED ORDER — ONDANSETRON HCL 4 MG/2ML IJ SOLN
INTRAMUSCULAR | Status: DC | PRN
  Administered 2020-01-27: 4 mg via INTRAVENOUS

## 2020-01-27 MED ORDER — FENTANYL CITRATE (PF) 100 MCG/2ML IJ SOLN
INTRAMUSCULAR | Status: DC | PRN
  Administered 2020-01-27 (×3): 50 ug via INTRAVENOUS

## 2020-01-27 MED ORDER — SODIUM CHLORIDE 0.9% FLUSH: 3.0000 mL | Freq: Two times a day (BID) | INTRAVENOUS | Status: DC

## 2020-01-27 MED ORDER — ONDANSETRON HCL 4 MG/2ML IJ SOLN: 4.0000 mg | Freq: Four times a day (QID) | INTRAMUSCULAR | Status: DC | PRN

## 2020-01-27 MED ORDER — PHENYLEPHRINE 40 MCG/ML (10ML) SYRINGE FOR IV PUSH (FOR BLOOD PRESSURE SUPPORT)
PREFILLED_SYRINGE | INTRAVENOUS | Status: AC
  Filled 2020-01-27: qty 10

## 2020-01-27 MED ORDER — PROPOFOL 10 MG/ML IV BOLUS
INTRAVENOUS | Status: DC | PRN
  Administered 2020-01-27: 200 mg via INTRAVENOUS

## 2020-01-27 MED ORDER — DEXAMETHASONE SODIUM PHOSPHATE 10 MG/ML IJ SOLN
INTRAMUSCULAR | Status: AC
  Filled 2020-01-27: qty 1

## 2020-01-27 MED ORDER — CIPROFLOXACIN IN D5W 400 MG/200ML IV SOLN
400.0000 mg | INTRAVENOUS | Status: AC
  Administered 2020-01-27: 400 mg via INTRAVENOUS

## 2020-01-27 MED ORDER — LIDOCAINE HCL (PF) 1 % IJ SOLN
INTRAMUSCULAR | Status: AC
  Filled 2020-01-27: qty 60

## 2020-01-27 MED ORDER — LIDOCAINE-EPINEPHRINE 1 %-1:100000 IJ SOLN
INTRAMUSCULAR | Status: DC | PRN
  Administered 2020-01-27: 20 mL

## 2020-01-27 MED ORDER — PROPOFOL 10 MG/ML IV BOLUS
INTRAVENOUS | Status: AC
  Filled 2020-01-27: qty 20

## 2020-01-27 MED ORDER — EPHEDRINE 5 MG/ML INJ
INTRAVENOUS | Status: AC
  Filled 2020-01-27: qty 10

## 2020-01-27 MED ORDER — DIPHENHYDRAMINE HCL 50 MG/ML IJ SOLN
INTRAMUSCULAR | Status: AC
  Filled 2020-01-27: qty 1

## 2020-01-27 MED ORDER — ONDANSETRON HCL 4 MG/2ML IJ SOLN
INTRAMUSCULAR | Status: AC
  Filled 2020-01-27: qty 2

## 2020-01-27 MED ORDER — ACETAMINOPHEN 325 MG RE SUPP: 650.0000 mg | RECTAL | Status: DC | PRN

## 2020-01-27 MED ORDER — MIDAZOLAM HCL 5 MG/5ML IJ SOLN
INTRAMUSCULAR | Status: DC | PRN
  Administered 2020-01-27: 2 mg via INTRAVENOUS

## 2020-01-27 SURGICAL SUPPLY — 56 items
ADH SKN CLS APL DERMABOND .7 (GAUZE/BANDAGES/DRESSINGS) ×1
BAG DRN INLT TBG SET TISS ACC (MISCELLANEOUS) ×1
BINDER ABDOMINAL  9 SM 30-45 (SOFTGOODS)
BINDER ABDOMINAL 10 UNV 27-48 (MISCELLANEOUS) ×3 IMPLANT
BINDER ABDOMINAL 12 SM 30-45 (SOFTGOODS) IMPLANT
BINDER ABDOMINAL 9 SM 30-45 (SOFTGOODS) IMPLANT
BINDER BREAST LRG (GAUZE/BANDAGES/DRESSINGS) IMPLANT
BINDER BREAST MEDIUM (GAUZE/BANDAGES/DRESSINGS) IMPLANT
BINDER BREAST XLRG (GAUZE/BANDAGES/DRESSINGS) ×3 IMPLANT
BINDER BREAST XXLRG (GAUZE/BANDAGES/DRESSINGS) IMPLANT
BLADE HEX COATED 2.75 (ELECTRODE) IMPLANT
BLADE SURG 15 STRL LF DISP TIS (BLADE) ×2 IMPLANT
BLADE SURG 15 STRL SS (BLADE) ×6
BNDG GAUZE ELAST 4 BULKY (GAUZE/BANDAGES/DRESSINGS) IMPLANT
COVER BACK TABLE 60X90IN (DRAPES) ×3 IMPLANT
COVER MAYO STAND STRL (DRAPES) ×3 IMPLANT
COVER WAND RF STERILE (DRAPES) IMPLANT
DECANTER SPIKE VIAL GLASS SM (MISCELLANEOUS) IMPLANT
DERMABOND ADVANCED (GAUZE/BANDAGES/DRESSINGS) ×2
DERMABOND ADVANCED .7 DNX12 (GAUZE/BANDAGES/DRESSINGS) ×1 IMPLANT
DRAPE LAPAROSCOPIC ABDOMINAL (DRAPES) ×3 IMPLANT
DRSG PAD ABDOMINAL 8X10 ST (GAUZE/BANDAGES/DRESSINGS) ×12 IMPLANT
ELECT REM PT RETURN 9FT ADLT (ELECTROSURGICAL) ×3
ELECTRODE REM PT RTRN 9FT ADLT (ELECTROSURGICAL) ×1 IMPLANT
EXTRACTOR CANIST REVOLVE STRL (CANNISTER) IMPLANT
GLOVE BIO SURGEON STRL SZ 6.5 (GLOVE) ×8 IMPLANT
GLOVE BIO SURGEONS STRL SZ 6.5 (GLOVE) ×4
GOWN STRL REUS W/ TWL LRG LVL3 (GOWN DISPOSABLE) ×3 IMPLANT
GOWN STRL REUS W/TWL LRG LVL3 (GOWN DISPOSABLE) ×9
IV LACTATED RINGERS 1000ML (IV SOLUTION) ×9 IMPLANT
LINER CANISTER 1000CC FLEX (MISCELLANEOUS) ×3 IMPLANT
NDL SAFETY ECLIPSE 18X1.5 (NEEDLE) ×1 IMPLANT
NEEDLE HYPO 18GX1.5 SHARP (NEEDLE) ×3
NEEDLE HYPO 25X1 1.5 SAFETY (NEEDLE) ×3 IMPLANT
NEEDLE PRECISIONGLIDE 27X1.5 (NEEDLE) ×3 IMPLANT
PACK BASIN DAY SURGERY FS (CUSTOM PROCEDURE TRAY) ×3 IMPLANT
PAD ALCOHOL SWAB (MISCELLANEOUS) ×3 IMPLANT
PENCIL SMOKE EVACUATOR (MISCELLANEOUS) IMPLANT
SLEEVE SCD COMPRESS KNEE MED (MISCELLANEOUS) ×3 IMPLANT
SPONGE LAP 18X18 RF (DISPOSABLE) ×6 IMPLANT
SUT MNCRL AB 4-0 PS2 18 (SUTURE) IMPLANT
SUT MON AB 5-0 PS2 18 (SUTURE) ×6 IMPLANT
SUT PDS 3-0 CT2 (SUTURE)
SUT PDS AB 2-0 CT2 27 (SUTURE) IMPLANT
SUT PDS II 3-0 CT2 27 ABS (SUTURE) IMPLANT
SYR 10ML LL (SYRINGE) ×12 IMPLANT
SYR 3ML 18GX1 1/2 (SYRINGE) IMPLANT
SYR 50ML LL SCALE MARK (SYRINGE) ×6 IMPLANT
SYR CONTROL 10ML LL (SYRINGE) ×3 IMPLANT
SYR TOOMEY 50ML (SYRINGE) ×6 IMPLANT
SYSTEM FAT FILTRATION 250 (MISCELLANEOUS) ×3 IMPLANT
TOWEL GREEN STERILE FF (TOWEL DISPOSABLE) ×6 IMPLANT
TRAY DSU PREP LF (CUSTOM PROCEDURE TRAY) ×3 IMPLANT
TUBING INFILTRATION IT-10001 (TUBING) ×3 IMPLANT
TUBING SET GRADUATE ASPIR 12FT (MISCELLANEOUS) ×3 IMPLANT
UNDERPAD 30X36 HEAVY ABSORB (UNDERPADS AND DIAPERS) ×6 IMPLANT

## 2020-01-27 NOTE — Transfer of Care (Signed)
Immediate Anesthesia Transfer of Care Note  Patient: LAVITA PONTIUS  Procedure(s) Performed: LIPOSUCTION WITH LIPOFILLING (Bilateral Breast)  Patient Location: PACU  Anesthesia Type:General  Level of Consciousness: sedated  Airway & Oxygen Therapy: Patient Spontanous Breathing and Patient connected to face mask oxygen  Post-op Assessment: Report given to RN and Post -op Vital signs reviewed and stable  Post vital signs: Reviewed and stable  Last Vitals:  Vitals Value Taken Time  BP 147/71 01/27/20 1149  Temp 36.5 C 01/27/20 1149  Pulse 89 01/27/20 1152  Resp 14 01/27/20 1152  SpO2 100 % 01/27/20 1152  Vitals shown include unvalidated device data.  Last Pain:  Vitals:   01/27/20 0826  TempSrc: Oral  PainSc: 0-No pain         Complications: No complications documented.

## 2020-01-27 NOTE — Interval H&P Note (Signed)
History and Physical Interval Note:  01/27/2020 9:31 AM  Tammy Elliott  has presented today for surgery, with the diagnosis of history of breast cancer.  The various methods of treatment have been discussed with the patient and family. After consideration of risks, benefits and other options for treatment, the patient has consented to  Procedure(s) with comments: LIPOSUCTION WITH LIPOFILLING (Bilateral) - 90 min as a surgical intervention.  The patient's history has been reviewed, patient examined, no change in status, stable for surgery.  I have reviewed the patient's chart and labs.  Questions were answered to the patient's satisfaction.     Loel Lofty Samwise Eckardt

## 2020-01-27 NOTE — Anesthesia Postprocedure Evaluation (Signed)
Anesthesia Post Note  Patient: Tammy Elliott  Procedure(s) Performed: LIPOSUCTION WITH LIPOFILLING (Bilateral Breast)     Patient location during evaluation: PACU Anesthesia Type: General Level of consciousness: awake and alert Pain management: pain level controlled Vital Signs Assessment: post-procedure vital signs reviewed and stable Respiratory status: spontaneous breathing, nonlabored ventilation, respiratory function stable and patient connected to nasal cannula oxygen Cardiovascular status: blood pressure returned to baseline and stable Postop Assessment: no apparent nausea or vomiting Anesthetic complications: no   No complications documented.  Last Vitals:  Vitals:   01/27/20 1200 01/27/20 1215  BP: (!) 150/65   Pulse: 86 83  Resp: 13 12  Temp:    SpO2: 100% 98%    Last Pain:  Vitals:   01/27/20 1149  TempSrc:   PainSc: [place]

## 2020-01-27 NOTE — Anesthesia Preprocedure Evaluation (Signed)
Anesthesia Evaluation  Patient identified by MRN, date of birth, ID band Patient awake    Reviewed: Allergy & Precautions, H&P , NPO status , Patient's Chart, lab work & pertinent test results  Airway Mallampati: II   Neck ROM: full    Dental   Pulmonary neg pulmonary ROS,    breath sounds clear to auscultation       Cardiovascular + CAD and + Cardiac Stents   Rhythm:regular Rate:Normal     Neuro/Psych    GI/Hepatic GERD  ,  Endo/Other  diabetes, Type 2  Renal/GU      Musculoskeletal   Abdominal   Peds  Hematology   Anesthesia Other Findings   Reproductive/Obstetrics                             Anesthesia Physical Anesthesia Plan  ASA: III  Anesthesia Plan: General   Post-op Pain Management:    Induction: Intravenous  PONV Risk Score and Plan: 3 and Ondansetron, Dexamethasone, Midazolam and Treatment may vary due to age or medical condition  Airway Management Planned: Oral ETT  Additional Equipment:   Intra-op Plan:   Post-operative Plan: Extubation in OR  Informed Consent: I have reviewed the patients History and Physical, chart, labs and discussed the procedure including the risks, benefits and alternatives for the proposed anesthesia with the patient or authorized representative who has indicated his/her understanding and acceptance.       Plan Discussed with: CRNA, Anesthesiologist and Surgeon  Anesthesia Plan Comments:         Anesthesia Quick Evaluation

## 2020-01-27 NOTE — Op Note (Signed)
DATE OF OPERATION: 01/27/2020  LOCATION: [facility]  PREOPERATIVE DIAGNOSIS: breast asymmetry left after breast cancer treatment  POSTOPERATIVE DIAGNOSIS: Same  PROCEDURE: Left breast lipofilling with release of scar contracture 2 x 4 cm (100 cc)  SURGEON: Lyndee Leo Sanger Timber Marshman, DO  ASSISTANT: Roetta Sessions. PA  EBL: 5 cc  CONDITION: Stable  COMPLICATIONS: None  INDICATION: The patient, Tammy Elliott, is a 69 y.o. female born on 05-21-1950, is here for treatment of breast asymmetry after breast reconstruction for cancer treatmetn.   PROCEDURE DETAILS:  The patient was seen prior to surgery and marked.  The IV antibiotics were given. The patient was taken to the operating room and given a general anesthetic. A standard time out was performed and all information was confirmed by those in the room. SCDs were placed.   The breast and abdomen were prepped and draped.  Local was placed at the umbilicus and inframammary fold for a 2 mm incision at each site.  The #15 blade was used to make the incision.  The tumescent was placed in the abdomen.  After waiting for the local to take effect the liposuction was performed.  The Pure Graft device was used to collect the fat.  The adipose was prepared according to the manufacture guidelines.  The fat was then placed in the medial aspect of the left breast for improved contour.  The edged cannula was placed to break up the scar tissue at the medial and lateral aspect of the left breast.  The liposuction was performed at the lateral aspect to improve the contour of the lump at the axillary line.  Fat was placed in the axilla for improved contour.  The incisions were closed with the 5-0 Monocryl.   The patient was placed in a binder with sterile dressings. The patient was allowed to wake up and taken to recovery room in stable condition at the end of the case. The family was notified at the end of the case.   The advanced practice  practitioner (APP) assisted throughout the case.  The APP was essential in retraction and counter traction when needed to make the case progress smoothly.  This retraction and assistance made it possible to see the tissue plans for the procedure.  The assistance was needed for blood control, tissue re-approximation and assisted with closure of the incision site.

## 2020-01-27 NOTE — Anesthesia Procedure Notes (Signed)
Procedure Name: LMA Insertion Date/Time: 01/27/2020 10:22 AM Performed by: Willa Frater, CRNA Pre-anesthesia Checklist: Patient identified, Emergency Drugs available, Suction available and Patient being monitored Patient Re-evaluated:Patient Re-evaluated prior to induction Oxygen Delivery Method: Circle system utilized Preoxygenation: Pre-oxygenation with 100% oxygen Induction Type: IV induction Ventilation: Mask ventilation without difficulty LMA: LMA inserted LMA Size: 4.0 Number of attempts: 1 Airway Equipment and Method: Bite block Placement Confirmation: positive ETCO2 Tube secured with: Tape Dental Injury: Teeth and Oropharynx as per pre-operative assessment

## 2020-01-27 NOTE — Discharge Instructions (Signed)
Post Anesthesia Home Care Instructions  Activity: Get plenty of rest for the remainder of the day. A responsible individual must stay with you for 24 hours following the procedure.  For the next 24 hours, DO NOT: -Drive a car -Paediatric nurse -Drink alcoholic beverages -Take any medication unless instructed by your physician -Make any legal decisions or sign important papers.  Meals: Start with liquid foods such as gelatin or soup. Progress to regular foods as tolerated. Avoid greasy, spicy, heavy foods. If nausea and/or vomiting occur, drink only clear liquids until the nausea and/or vomiting subsides. Call your physician if vomiting continues.  Special Instructions/Symptoms: Your throat may feel dry or sore from the anesthesia or the breathing tube placed in your throat during surgery. If this causes discomfort, gargle with warm salt water. The discomfort should disappear within 24 hours.        INSTRUCTIONS FOR AFTER SURGERY   You will likely have some questions about what to expect following your operation.  The following information will help you and your family understand what to expect when you are discharged from the hospital.  Following these guidelines will help ensure a smooth recovery and reduce risks of complications.  Postoperative instructions include information on: diet, wound care, medications and physical activity.  AFTER SURGERY Expect to go home after the procedure.  In some cases, you may need to spend one night in the hospital for observation.  DIET This surgery does not require a specific diet.  However, I have to mention that the healthier you eat the better your body can start healing. It is important to increasing your protein intake.  This means limiting the foods with added sugar.  Focus on fruits and vegetables and some meat. It is very important to drink water after your surgery.  If your urine is bright yellow, then it is concentrated, and you need to  drink more water.  As a general rule after surgery, you should have 8 ounces of water every hour while awake.  If you find you are persistently nauseated or unable to take in liquids let us know.  NO TOBACCO USE or EXPOSURE.  This will slow your healing process and increase the risk of a wound.  WOUND CARE If you don't have a drain: You can shower the day after surgery.  Use fragrance free soap.  Dial, [place], [place] and Cetaphil are usually mild on the skin.   If you have steri-strips / tape directly attached to your skin leave them in place. It is OK to get these wet.  No baths, pools or hot tubs for two weeks. We close your incision to leave the smallest and best-looking scar. No ointment or creams on your incisions until given the go ahead.  Especially not Neosporin (Too many skin reactions with this one).  A few weeks after surgery you can use Mederma and start massaging the scar. We ask you to wear your binder or sports bra for the first 6 weeks around the clock, including while sleeping. This provides added comfort and helps reduce the fluid accumulation at the surgery site.  ACTIVITY No heavy lifting until cleared by the doctor.  It is OK to walk and climb stairs. In fact, moving your legs is very important to decrease your risk of a blood clot.  It will also help keep you from getting deconditioned.  Every 1 to 2 hours get up and walk for 5 minutes. This will help with a quicker recovery back  to normal.  Let pain be your guide so you don't do too much.  NO, you cannot do the spring cleaning and don't plan on taking care of anyone else.  This is your time for TLC.   WORK Everyone returns to work at different times. As a rough guide, most people take at least 1 - 2 weeks off prior to returning to work. If you need documentation for your job, bring the forms to your postoperative follow up visit.  DRIVING Arrange for someone to bring you home from the hospital.  You may be able to drive a few days  after surgery but not while taking any narcotics or valium.  BOWEL MOVEMENTS Constipation can occur after anesthesia and while taking pain medication.  It is important to stay ahead for your comfort.  We recommend taking Milk of Magnesia (2 tablespoons; twice a day) while taking the pain pills.  SEROMA This is fluid your body tried to put in the surgical site.  This is normal but if it creates excessive pain and swelling let us know.  It usually decreases in a few weeks.  MEDICATIONS and PAIN CONTROL At your preoperative visit for you history and physical you were given the following medications: 1. An antibiotic: Start this medication when you get home and take according to the instructions on the bottle. 2. Zofran 4 mg:  This is to treat nausea and vomiting.  You can take this every 6 hours as needed and only if needed. 3. Norco (hydrocodone/acetaminophen) 5/325 mg:  This is only to be used after you have taken the motrin or the tylenol. Every 8 hours as needed. Over the counter Medication to take: 4. Ibuprofen (Motrin) 600 mg:  Take this every 6 hours.  If you have additional pain then take 500 mg of the tylenol.  Only take the Norco after you have tried these two. 5. Miralax or stool softener of choice: Take this according to the bottle if you take the [place] Call your surgeon's office if any of the following occur: . Fever 101 degrees F or greater . Excessive bleeding or fluid from the incision site. . Pain that increases over time without aid from the medications . Redness, warmth, or pus draining from incision sites . Persistent nausea or inability to take in liquids . Severe misshapen area that underwent the operation.

## 2020-01-28 ENCOUNTER — Encounter (HOSPITAL_BASED_OUTPATIENT_CLINIC_OR_DEPARTMENT_OTHER): Payer: Self-pay | Admitting: Plastic Surgery

## 2020-02-01 ENCOUNTER — Encounter: Payer: Self-pay | Admitting: Plastic Surgery

## 2020-02-02 ENCOUNTER — Ambulatory Visit: Payer: 59

## 2020-02-08 ENCOUNTER — Encounter: Payer: Self-pay | Admitting: Plastic Surgery

## 2020-02-08 ENCOUNTER — Other Ambulatory Visit: Payer: Self-pay

## 2020-02-08 ENCOUNTER — Ambulatory Visit (INDEPENDENT_AMBULATORY_CARE_PROVIDER_SITE_OTHER): Payer: 59 | Admitting: Plastic Surgery

## 2020-02-08 VITALS — BP 113/67 | HR 79 | Temp 96.6°F

## 2020-02-08 DIAGNOSIS — C44521 Squamous cell carcinoma of skin of breast: Secondary | ICD-10-CM

## 2020-02-08 NOTE — Progress Notes (Signed)
The patient is a 69 year old female here for follow-up after lipofilling of the left breast.  She has bruising of her abdomen and a little in the breast.  This is as expected.  Overall she feels like she is doing very well.  There is a great improvement in the breast area.  She has noticed a little bit of change of the last day or 2 with the swelling.  There is no sign of a seroma or hematoma.  No sign of infection.  Continue with the sports bra and start wearing a spanks.  We will plan to see her back in a few weeks.  Pictures were obtained of the patient and placed in the chart with the patient's or guardian's permission.

## 2020-02-10 ENCOUNTER — Encounter: Payer: Self-pay | Admitting: Oncology

## 2020-02-10 ENCOUNTER — Other Ambulatory Visit: Payer: Self-pay

## 2020-02-10 MED ORDER — ANASTROZOLE 1 MG PO TABS
1.0000 mg | ORAL_TABLET | Freq: Every day | ORAL | 0 refills | Status: DC
Start: 2020-02-10 — End: 2020-05-04

## 2020-02-17 ENCOUNTER — Ambulatory Visit
Admission: RE | Admit: 2020-02-17 | Discharge: 2020-02-17 | Disposition: A | Discharge status: Home / Self Care | Payer: 59 | Source: Ambulatory Visit | Attending: Oncology | Admitting: Oncology

## 2020-02-17 ENCOUNTER — Other Ambulatory Visit: Payer: Self-pay

## 2020-02-17 DIAGNOSIS — Z1231 Encounter for screening mammogram for malignant neoplasm of breast: Secondary | ICD-10-CM | POA: Diagnosis not present

## 2020-02-18 ENCOUNTER — Inpatient Hospital Stay: Admission: RE | Admit: 2020-02-18 | Payer: 59 | Source: Ambulatory Visit

## 2020-02-25 ENCOUNTER — Encounter: Payer: Self-pay | Admitting: Surgical

## 2020-02-25 ENCOUNTER — Ambulatory Visit (INDEPENDENT_AMBULATORY_CARE_PROVIDER_SITE_OTHER): Payer: 59 | Admitting: Surgical

## 2020-02-25 ENCOUNTER — Other Ambulatory Visit: Payer: Self-pay

## 2020-02-25 VITALS — BP 129/88 | HR 80 | Temp 94.1°F

## 2020-02-25 DIAGNOSIS — Z9012 Acquired absence of left breast and nipple: Secondary | ICD-10-CM

## 2020-02-25 NOTE — Progress Notes (Signed)
Patient is a 69 year old female here for follow-up after liposuction of abdomen with Lipo filling of left breast with Dr. Marla Roe on 01/27/2020.  Patient reports she had a lot of pain postoperatively in her abdomen.  She reports now she is doing a lot better.  She feels as if she has lost some of the fat grafting in the left medial breast.  Chaperone present on exam On exam left breast incisions intact.  Left breast medial scar contracture noted, some loss of volume noted.  There is some improvement from her preoperative photos in regards to the medial scar contracture and the loss of volume superior to this.  There is no erythema.  There is no drainage noted.  I removed the Monocryl suture knot over the left medial breast -patient tolerated this fine. Patient is overall doing well, no sign of infection, seroma, hematoma. Some of the fat has been retained, there is some improved contour of the left medial breast, it has slightly diminished since her last visit, however it is improved from her preoperative photos.  We did discuss that this is possible with fat grafting.  Patient would like to schedule follow-up in 6 months for reevaluation.  I recommend she call with any questions or concerns prior to then.  Pictures were obtained of the patient and placed in the patient's chart with permission of the patient.

## 2020-03-02 ENCOUNTER — Ambulatory Visit
Admission: RE | Admit: 2020-03-02 | Discharge: 2020-03-02 | Disposition: A | Discharge status: Home / Self Care | Payer: 59 | Source: Ambulatory Visit | Attending: Internal Medicine | Admitting: Internal Medicine

## 2020-03-02 ENCOUNTER — Other Ambulatory Visit: Payer: Self-pay

## 2020-03-02 DIAGNOSIS — R1013 Epigastric pain: Secondary | ICD-10-CM

## 2020-03-02 LAB — POCT I-STAT CREATININE: Creatinine, Ser: 0.8 mg/dL (ref 0.44–1.00)

## 2020-03-02 MED ORDER — IOHEXOL 300 MG/ML  SOLN
100.0000 mL | Freq: Once | INTRAMUSCULAR | Status: AC | PRN
  Administered 2020-03-02: 09:00:00 100 mL via INTRAVENOUS

## 2020-03-24 ENCOUNTER — Telehealth: Payer: Self-pay | Admitting: Oncology

## 2020-03-24 NOTE — Telephone Encounter (Signed)
03/24/2020  Called pt to inform her appts have been moved out to 04/20/20 per Dr. Tasia Catchings due to inclement weather. Pt confirmed change   srw

## 2020-03-27 ENCOUNTER — Inpatient Hospital Stay: Payer: Medicare Other | Admitting: Oncology

## 2020-03-27 ENCOUNTER — Inpatient Hospital Stay: Payer: Medicare Other

## 2020-03-28 ENCOUNTER — Encounter (INDEPENDENT_AMBULATORY_CARE_PROVIDER_SITE_OTHER): Payer: Self-pay | Admitting: Vascular Surgery

## 2020-03-28 ENCOUNTER — Other Ambulatory Visit: Payer: Self-pay

## 2020-03-28 ENCOUNTER — Ambulatory Visit (INDEPENDENT_AMBULATORY_CARE_PROVIDER_SITE_OTHER): Payer: Medicare Other | Admitting: Vascular Surgery

## 2020-03-28 VITALS — BP 139/78 | HR 88 | Resp 16 | Ht 68.0 in | Wt 180.4 lb

## 2020-03-28 DIAGNOSIS — E118 Type 2 diabetes mellitus with unspecified complications: Secondary | ICD-10-CM | POA: Diagnosis not present

## 2020-03-28 DIAGNOSIS — R102 Pelvic and perineal pain: Secondary | ICD-10-CM | POA: Insufficient documentation

## 2020-03-28 DIAGNOSIS — E785 Hyperlipidemia, unspecified: Secondary | ICD-10-CM

## 2020-03-28 DIAGNOSIS — C4491 Basal cell carcinoma of skin, unspecified: Secondary | ICD-10-CM | POA: Insufficient documentation

## 2020-03-28 DIAGNOSIS — R1030 Lower abdominal pain, unspecified: Secondary | ICD-10-CM

## 2020-03-28 DIAGNOSIS — R109 Unspecified abdominal pain: Secondary | ICD-10-CM | POA: Insufficient documentation

## 2020-03-28 NOTE — Assessment & Plan Note (Signed)
blood glucose control important in reducing the progression of atherosclerotic disease. Also, involved in wound healing. On appropriate medications.  

## 2020-03-28 NOTE — Assessment & Plan Note (Signed)
The patient has abdominal pain that is not entirely clear in its etiology.  The possibility of ischemic colitis was raised on the CT scan and full view of the visceral vessels is really not possible on the study.  I would recommend a mesenteric duplex to evaluate the flow within her visceral vessels.  I discussed the differences between ischemic colitis and other forms of colitis.  I also discussed that most ischemic colitis is not from large vessel disease but from small vessel disease that does not benefit from revascularization.  We will see her back in the near future with mesenteric duplex.

## 2020-03-28 NOTE — Progress Notes (Signed)
Patient ID: Tammy Elliott, female   DOB: Jun 21, 1950, 70 y.o.   MRN: [id number]  Chief Complaint  Patient presents with  . New Patient (Initial Visit)    Ref Johnson disorder of intestine    HPI Tammy Elliott is a 70 y.o. female.  I am asked to see the patient by Dr. Edwina Barth for evaluation of abdominal pain and findings on her CT scan worrisome for either infectious, inflammatory, or ischemic colitis.  She was having lower abdominal pain as well as low back pain that prompted the CT scan.  I have independently reviewed the CT scan.  It is difficult to discern degree of stenosis within the visceral vessels, but she definitely has atherosclerotic plaque of the aorta and branch vessels.  She does not have significant postprandial abdominal pain in general.  The pain is more random.  She is eating okay and has not had unintentional weight loss.  Her bowel habits have been okay as well.     Past Medical History:  Diagnosis Date  . Basal cell carcinoma    basal cell -skin cancer s/p removal  . Breast cancer ([place]) 02/2019   left  . CAD S/P percutaneous coronary angioplasty 2012   Dr. Saralyn Pilar ([place], [place]): DES PCI to LAD  . DM (diabetes mellitus), type 2 with complications (HCC)    CAD  . GERD (gastroesophageal reflux disease)   . Hypercholesterolemia   . Hyperlipidemia associated with type 2 diabetes mellitus ([place])    On lovastatin    Past Surgical History:  Procedure Laterality Date  . BREAST BIOPSY Left 03/11/2019   stereo biopsy/ x clip/path pending  . BREAST RECONSTRUCTION WITH PLACEMENT OF TISSUE EXPANDER AND FLEX HD (ACELLULAR HYDRATED DERMIS) Left 05/03/2019   Procedure: LEFT BREAST RECONSTRUCTION WITH PLACEMENT OF TISSUE EXPANDER AND FLEX HD (ACELLULAR HYDRATED DERMIS);  Surgeon: Wallace Going, DO;  Location: [facility];  Service: Plastics;  Laterality: Left;  . CARDIAC CATHETERIZATION    . CHOLECYSTECTOMY    . COLONOSCOPY    . CORONARY STENT  INTERVENTION  2012   Dr. Saralyn Pilar ([place]): DES PCI pLAD  . LIPOSUCTION WITH LIPOFILLING Bilateral 01/27/2020   Procedure: LIPOSUCTION WITH LIPOFILLING;  Surgeon: Wallace Going, DO;  Location: [facility];  Service: Plastics;  Laterality: Bilateral;  90 min  . MASTECTOMY Left 02/2019  . MASTOPEXY Right 07/01/2019   Procedure: MASTOPEXY;  Surgeon: Wallace Going, DO;  Location: [facility];  Service: Plastics;  Laterality: Right;  . NM GATED MYOVIEW (ARMX HX)  05/09/2017   [facility]) exercised 8:36 min with no chest pain or dyspnea.  No EKG changes.  Normal EF 66%.  No ischemia or infarction)  . REMOVAL OF TISSUE EXPANDER AND PLACEMENT OF IMPLANT Left 07/01/2019   Procedure: REMOVAL OF TISSUE EXPANDER AND PLACEMENT OF IMPLANT;  Surgeon: Wallace Going, DO;  Location: [facility];  Service: Plastics;  Laterality: Left;  total case 2.5 hours  . TOTAL MASTECTOMY Left 05/03/2019   Procedure: TOTAL MASTECTOMY W/ Sentinel Node;  Surgeon: Herbert Pun, MD;  Location: [facility];  Service: General;  Laterality: Left;     Family History  Problem Relation Age of Onset  . CAD Mother   . Diabetes Mellitus II Mother   . Hypertension Mother   . Uterine cancer Mother   . CAD Father   . Heart attack Father   . Hypertension Father   .  Diabetes Mellitus II Father        Took insulin  . Alcohol abuse Father   . Diabetes Mellitus II Sister   . Diabetes Other        Almost all sisters are diabetic or pre-diabetic   . Breast cancer Neg Hx      Social History   Tobacco Use  . Smoking status: Never Smoker  . Smokeless tobacco: Never Used  Vaping Use  . Vaping Use: Never used  Substance Use Topics  . Alcohol use: Not Currently  . Drug use: Never     Allergies  Allergen Reactions  . Exenatide Nausea Only  . Orlistat     Other reaction(s): Unknown  . Penicillins Hives and Swelling  .  Thiazide-Type Diuretics     Other reaction(s): Unknown    Current Outpatient Medications  Medication Sig Dispense Refill  . anastrozole (ARIMIDEX) 1 MG tablet Take 1 tablet (1 mg total) by mouth daily. 90 tablet 0  . CALCIUM-MAGNESIUM-ZINC PO Take 1 tablet by mouth every evening.     . clopidogrel (PLAVIX) 75 MG tablet Take 75 mg by mouth daily.     Marland Kitchen glimepiride (AMARYL) 4 MG tablet Take 4 mg by mouth 2 (two) times daily.     Marland Kitchen JANUMET 50-500 MG tablet Take 1 tablet by mouth 2 (two) times daily.    Marland Kitchen JARDIANCE 25 MG TABS tablet Take 25 mg by mouth daily.     . lansoprazole (PREVACID) 15 MG capsule Take 15 mg by mouth daily.     Marland Kitchen lovastatin (MEVACOR) 40 MG tablet Take 40 mg by mouth at bedtime.     . metFORMIN (GLUCOPHAGE) 500 MG tablet Take 500 mg by mouth 2 (two) times daily.     . Multiple Vitamin (MULTIVITAMIN) tablet Take 1 tablet by mouth daily.    . Vitamin D, Cholecalciferol, 25 MCG (1000 UT) TABS Take by mouth.    . diazepam (VALIUM) 2 MG tablet Take 1 tablet (2 mg total) by mouth every 12 (twelve) hours as needed for muscle spasms. (Patient not taking: No sig reported) 20 tablet 0  . ondansetron (ZOFRAN) 4 MG tablet Take 1 tablet (4 mg total) by mouth every 8 (eight) hours as needed for nausea or vomiting. (Patient not taking: No sig reported) 20 tablet 0   No current facility-administered medications for this visit.      REVIEW OF SYSTEMS (Negative unless checked)  Constitutional: [] Weight loss  [] Fever  [] Chills Cardiac: [] Chest pain   [] Chest pressure   [] Palpitations   [] Shortness of breath when laying flat   [] Shortness of breath at rest   [] Shortness of breath with exertion. Vascular:  [] Pain in legs with walking   [] Pain in legs at rest   [] Pain in legs when laying flat   [] Claudication   [] Pain in feet when walking  [] Pain in feet at rest  [] Pain in feet when laying flat   [] History of DVT   [] Phlebitis   [] Swelling in legs   [] Varicose veins   [] Non-healing  ulcers Pulmonary:   [] Uses home oxygen   [] Productive cough   [] Hemoptysis   [] Wheeze  [] COPD   [] Asthma Neurologic:  [] Dizziness  [] Blackouts   [] Seizures   [] History of stroke   [] History of TIA  [] Aphasia   [] Temporary blindness   [] Dysphagia   [] Weakness or numbness in arms   [] Weakness or numbness in legs Musculoskeletal:  [x] Arthritis   [] Joint swelling   [] Joint pain   []   Low back pain Hematologic:  [] Easy bruising  [] Easy bleeding   [] Hypercoagulable state   [] Anemic  [] Hepatitis Gastrointestinal:  [] Blood in stool   [] Vomiting blood  [x] Gastroesophageal reflux/heartburn   [x] Abdominal pain Genitourinary:  [] Chronic kidney disease   [] Difficult urination  [] Frequent urination  [] Burning with urination   [] Hematuria Skin:  [] Rashes   [] Ulcers   [] Wounds Psychological:  [] History of anxiety   []  History of major depression.    Physical Exam BP 139/78 (BP Location: Right Arm)   Pulse 88   Resp 16   Ht 5\' 8"  (1.727 m)   Wt 180 lb 6.4 oz (81.8 kg)   BMI 27.43 kg/m  Gen:  WD/WN, NAD Head: [place]/AT, No temporalis wasting.  Ear/Nose/Throat: Hearing grossly intact, nares w/o erythema or drainage, oropharynx w/o Erythema/Exudate Eyes: Conjunctiva clear, sclera non-icteric  Neck: trachea midline.  No JVD.  Pulmonary:  Good air movement, respirations not labored, no use of accessory muscles  Cardiac: RRR, no JVD Vascular:  Vessel Right Left  Radial Palpable Palpable                                   Gastrointestinal:. No masses, surgical incisions, or scars.  No rebound or guarding Musculoskeletal: M/S 5/5 throughout.  Extremities without ischemic changes.  No deformity or atrophy. No edema. Neurologic: Sensation grossly intact in extremities.  Symmetrical.  Speech is fluent. Motor exam as listed above. Psychiatric: Judgment intact, Mood & affect appropriate for pt's clinical situation. Dermatologic: No rashes or ulcers noted.  No cellulitis or open wounds.    Radiology CT  ABDOMEN PELVIS W WO CONTRAST  Result Date: 03/02/2020 CLINICAL DATA:  Abdominal pain for 1 year, history of left breast cancer status post mastectomy and cholecystectomy. EXAM: CT ABDOMEN AND PELVIS WITHOUT AND WITH CONTRAST TECHNIQUE: Multidetector CT imaging of the abdomen and pelvis was performed following the standard protocol before and following the bolus administration of intravenous contrast. CONTRAST:  167mL OMNIPAQUE IOHEXOL 300 MG/ML SOLN, additional oral enteric contrast COMPARISON:  None. FINDINGS: Lower chest: No acute abnormality.  Coronary artery calcifications. Hepatobiliary: No focal liver abnormality is seen. Status post cholecystectomy. No biliary dilatation. Pancreas: Unremarkable. No pancreatic ductal dilatation or surrounding inflammatory changes. Spleen: Normal in size without significant abnormality. Adrenals/Urinary Tract: Adrenal glands are unremarkable. Kidneys are normal, without renal calculi, solid lesion, or hydronephrosis. Bladder is unremarkable. Stomach/Bowel: Stomach is within normal limits. Appendix appears normal. The sigmoid colon and rectum are decompressed although somewhat thickened and inflamed appearing with adjacent vascular combing (series 4, image 78). Vascular/Lymphatic: Aortic atherosclerosis. No enlarged abdominal or pelvic lymph nodes. Reproductive: Small calcified uterine fibroids. Other: No abdominal wall hernia or abnormality. Evidence of prior abdominoplasty. No abdominopelvic ascites. Musculoskeletal: No acute or significant osseous findings. IMPRESSION: 1. The sigmoid colon and rectum are decompressed although somewhat thickened and inflamed appearing with adjacent vascular combing. Findings suggest, but are not definitive for nonspecific infectious, inflammatory, or ischemic colitis. Correlate with referable clinical signs and symptoms, if present. 2. No other findings of the abdomen or pelvis to explain abdominal pain. 3. Status post cholecystectomy. 4.  Coronary artery disease. Aortic Atherosclerosis (ICD10-I70.0). Electronically Signed   By: Eddie Candle M.D.   On: 03/02/2020 09:39    Labs Recent Results (from the past 2160 hour(s))  SARS CORONAVIRUS 2 (TAT 6-24 HRS) Nasopharyngeal Nasopharyngeal Swab     Status: None   Collection Time: 01/24/20 11:15 AM  Specimen: Nasopharyngeal Swab  Result Value Ref Range   SARS Coronavirus 2 NEGATIVE NEGATIVE    Comment: (NOTE) SARS-CoV-2 target nucleic acids are NOT DETECTED.  The SARS-CoV-2 RNA is generally detectable in upper and lower respiratory specimens during the acute phase of infection. Negative results do not preclude SARS-CoV-2 infection, do not rule out co-infections with other pathogens, and should not be used as the sole basis for treatment or other patient management decisions. Negative results must be combined with clinical observations, patient history, and epidemiological information. The expected result is Negative.  Fact Sheet for Patients: SugarRoll.be  Fact Sheet for Healthcare Providers: https://www.woods-mathews.com/  This test is not yet approved or cleared by the [place] FDA and  has been authorized for detection and/or diagnosis of SARS-CoV-2 by FDA under an Emergency Use Authorization (EUA). This EUA will remain  in effect (meaning this test can be used) for the duration of the COVID-19 declaration under Se ction 564(b)(1) of the Act, 21 U.S.C. section 360bbb-3(b)(1), unless the authorization is terminated or revoked sooner.  Performed at [facility], [place] [address] [place], [place] [id number]   Basic metabolic panel     Status: Abnormal   Collection Time: 01/24/20 12:42 PM  Result Value Ref Range   Sodium 138 135 - 145 mmol/L   Potassium 4.7 3.5 - 5.1 mmol/L   Chloride 102 98 - 111 mmol/L   CO2 24 22 - 32 mmol/L   Glucose, Bld 135 (H) 70 - 99 mg/dL    Comment: Glucose reference range applies only  to samples taken after fasting for at least 8 hours.   BUN 15 8 - 23 mg/dL   Creatinine, Ser 0.85 0.44 - 1.00 mg/dL   Calcium 9.6 8.9 - 10.3 mg/dL   GFR, Estimated >60 >60 mL/min    Comment: (NOTE) Calculated using the CKD-EPI Creatinine Equation (2021)    Anion gap 12 5 - 15    Comment: Performed at [facility] [address], [place], [place] [address]  Glucose, capillary     Status: Abnormal   Collection Time: 01/27/20 11:56 AM  Result Value Ref Range   Glucose-Capillary 165 (H) 70 - 99 mg/dL    Comment: Glucose reference range applies only to samples taken after fasting for at least 8 hours.  I-STAT creatinine     Status: None   Collection Time: 03/02/20  9:05 AM  Result Value Ref Range   Creatinine, Ser 0.80 0.44 - 1.00 mg/dL    Assessment/Plan:  Abdominal pain The patient has abdominal pain that is not entirely clear in its etiology.  The possibility of ischemic colitis was raised on the CT scan and full view of the visceral vessels is really not possible on the study.  I would recommend a mesenteric duplex to evaluate the flow within her visceral vessels.  I discussed the differences between ischemic colitis and other forms of colitis.  I also discussed that most ischemic colitis is not from large vessel disease but from small vessel disease that does not benefit from revascularization.  We will see her back in the near future with mesenteric duplex.  Diabetes mellitus type 2 with complications (HCC) blood glucose control important in reducing the progression of atherosclerotic disease. Also, involved in wound healing. On appropriate medications.   Hyperlipemia lipid control important in reducing the progression of atherosclerotic disease. Continue statin therapy       Leotis Pain 03/28/2020, 2:32 PM   This note was  created with Dragon medical transcription system.  Any errors from dictation are unintentional.

## 2020-03-28 NOTE — Patient Instructions (Signed)
Chronic Mesenteric Ischemia  Chronic mesenteric ischemia is poor blood flow (circulation) in the vessels that supply blood to the stomach, intestines, and liver (mesenteric organs). When the blood supply is severely restricted, these organs cannot work properly. This condition is also called mesenteric angina, or intestinal angina. This condition is a long-term (chronic) condition. It happens when an artery or vein that provides blood to the mesenteric organs gradually becomes blocked or narrows over time, restricting the blood supply to these organs. What are the causes? This condition is commonly caused by fatty deposits that build up in an artery (plaque), which can narrow the artery and restrict blood flow. Other causes include:  Weakened areas in blood vessel walls (aneurysms).  Conditions that cause twisting or inflammation of blood vessels, such as fibromuscular dysplasia or arteritis.  A disorder in which blood clots form in the veins (venous thrombosis).  Scarring and thickening (fibrosis) of blood vessels caused by radiation therapy.  A tear in the aorta, the body's main artery (aortic dissection).  Blood vessel problems after illegal drug use, such as use of cocaine.  Tumors in the nervous system (neurofibromatosis).  Certain autoimmune diseases, such as lupus. What increases the risk? The following factors may make you more likely to develop this condition:  Being female.  Being over age 40, especially if you have a history of heart problems.  Smoking.  Having congestive heart failure.  Having an irregular heartbeat (arrhythmia).  Having a history of heart attack or stroke.  Having diabetes.  Having high cholesterol.  Having high blood pressure (hypertension).  Being overweight or obese.  Having kidney disease (renal disease) that requires dialysis. What are the signs or symptoms? Symptoms of this condition include:  Pain or cramps in the abdomen that  develop 15-60 minutes after a meal. This pain may last for 1-3 hours. Some people may develop a fear of eating because of this symptom.  Weight loss.  Diarrhea.  Bloody stool.  Nausea.  Vomiting.  Bloating.  Abdominal pain after stress or with exercise. How is this diagnosed? This condition is diagnosed based on:  Your medical history.  A physical exam.  Tests, such as: ? Ultrasound. ? CT scan. ? Blood tests. ? Urine tests. ? An imaging test that involves injecting a dye into your arteries to show blood flow through blood vessels (angiogram). This can help to show if there are any blockages in the vessels that lead to the intestines. ? Passing a small probe through the mouth and into the stomach to measure the output of carbon dioxide (gastric tonometry). This can help to indicate whether there is decreased blood flow to the stomach and intestines. How is this treated? This condition may be treated with:  Dietary changes such as eating smaller, low-fat, meals more frequently.  Lifestyle changes to treat underlying conditions that contribute to the disease, such as high cholesterol and high blood pressure.  Medicines to reduce blood clotting and increase blood flow.  Surgery to remove the blockage, repair arteries or veins, and restore blood flow. This may involve: ? Angioplasty. This is surgery to widen the affected artery, reduce the blockage, and sometimes insert a small, mesh tube (stent). ? Bypass surgery. This may be done to go around (bypass) the blockage and reconnect healthy arteries or veins. ? Placing a stent in the affected area. This may be done to help keep blocked arteries open. Follow these instructions at home: Eating and drinking  Eat a heart-healthy diet. This includes  fresh fruits and vegetables, whole grains, and lean proteins like chicken, fish, and beans.  Avoid foods that contain a lot of: ? Salt (sodium). ? Sugar. ? Saturated fat (such as red  meat). ? Trans fat (such as in fried foods).  Stay hydrated. Drink enough fluid to keep your urine pale yellow.   Lifestyle  Stay active and get regular exercise as told by your health care provider. Aim for 150 minutes of moderate activity or 75 minutes of vigorous activity a week. Ask your health care provider what activities and forms of exercise are safe for you.  Maintain a healthy weight.  Work with your health care provider to manage your cholesterol.  Manage any other health problems you have, such as high blood pressure, diabetes, or heart rhythm problems.  Do not use any products that contain nicotine or tobacco, such as cigarettes, e-cigarettes, and chewing tobacco. If you need help quitting, ask your health care provider. General instructions  Take over-the-counter and prescription medicines only as told by your health care provider.  Keep all follow-up visits as told by your health care provider. This is important.  You may need to take actions to prevent or treat constipation, such as: ? Drink enough fluid to keep your urine pale yellow. ? Take over-the-counter or prescription medicines. ? Eat foods that are high in fiber, such as beans, whole grains, and fresh fruits and vegetables. ? Limit foods that are high in fat and processed sugars, such as fried or sweet foods. Contact a health care provider if:  Your symptoms do not improve or they return after treatment.  You have a fever.  You are constipated. Get help right away if you:  Have severe abdominal pain.  Have severe chest pain.  Have shortness of breath.  Feel weak or dizzy.  Have fast or irregular heartbeats (palpitations).  Have numbness or weakness in your face, arm, or leg.  Are confused.  Have trouble speaking or people have trouble understanding what you are saying.  Have trouble urinating.  Have blood in your stool.  Have severe nausea, vomiting, or persistent diarrhea. These  symptoms may represent a serious problem that is an emergency. Do not wait to see if the symptoms will go away. Get medical help right away. Call your local emergency services (911 in the [place] Do not drive yourself to the hospital. Summary  Mesenteric ischemia is poor circulation in the vessels that supply blood to the the stomach, intestines, and liver (mesenteric organs).  This condition happens when an artery or vein that provides blood to the mesenteric organs gradually becomes blocked or narrow, restricting the blood supply to the organs.  This condition is commonly caused by fatty deposits that build up in an artery (plaque), which can narrow the artery and restrict blood flow.  You are more likely to develop this condition if you are over age 75 and have a history of heart problems, high blood pressure, diabetes, or high cholesterol.  This condition is usually treated with medicines, dietary and lifestyle changes, and surgery to remove the blockage, repair arteries or veins, and restore blood flow. This information is not intended to replace advice given to you by your health care provider. Make sure you discuss any questions you have with your health care provider. Document Revised: 10/31/2017 Document Reviewed: 10/31/2017 Elsevier Patient Education  2021 [employer].

## 2020-03-28 NOTE — Assessment & Plan Note (Signed)
lipid control important in reducing the progression of atherosclerotic disease. Continue statin therapy  

## 2020-04-03 ENCOUNTER — Ambulatory Visit: Payer: 59

## 2020-04-07 ENCOUNTER — Encounter: Payer: Self-pay | Admitting: Oncology

## 2020-04-10 ENCOUNTER — Ambulatory Visit (INDEPENDENT_AMBULATORY_CARE_PROVIDER_SITE_OTHER): Payer: Medicare Other | Admitting: Nurse Practitioner

## 2020-04-10 ENCOUNTER — Ambulatory Visit (INDEPENDENT_AMBULATORY_CARE_PROVIDER_SITE_OTHER): Payer: Medicare Other

## 2020-04-10 ENCOUNTER — Other Ambulatory Visit: Payer: Self-pay

## 2020-04-10 ENCOUNTER — Encounter (INDEPENDENT_AMBULATORY_CARE_PROVIDER_SITE_OTHER): Payer: Self-pay | Admitting: Nurse Practitioner

## 2020-04-10 VITALS — BP 115/67 | HR 90 | Ht 68.0 in | Wt 180.0 lb

## 2020-04-10 DIAGNOSIS — K551 Chronic vascular disorders of intestine: Secondary | ICD-10-CM | POA: Diagnosis not present

## 2020-04-10 DIAGNOSIS — E785 Hyperlipidemia, unspecified: Secondary | ICD-10-CM

## 2020-04-10 DIAGNOSIS — R1030 Lower abdominal pain, unspecified: Secondary | ICD-10-CM

## 2020-04-10 NOTE — H&P (View-Only) (Signed)
Subjective:    Patient ID: Tammy Elliott, female    DOB: [birth year], 70 y.o.   MRN: [id number] Chief Complaint  Patient presents with  . Follow-up    Pt conv. Mesenteric     The patient presents today for follow-up studies following a CT scan that raised the possibility of having ischemic colitis.  In the CT scan for review of the visceral vessels was not exactly possible.  Patient describes a diffuse abdominal pain.  She denies any food phobia or worsening of her abdominal pain after eating.  She denies any nausea or vomiting.  She notes the pain has a sort of dull ache.  She denies any fever, chills, nausea, vomiting or diarrhea.  Today noninvasive studies show a normal celiac artery, splenic artery hepatic artery findings.  There is a 70 to 99% stenosis seen in the superior mesenteric artery.  The velocities in the proximal SFA estimate a greater than 70 to 99% stenosis.  The largest aortic diameter is 2.3 cm.     Review of Systems  Gastrointestinal: Positive for abdominal pain. Negative for nausea and vomiting.  All other systems reviewed and are negative.      Objective:   Physical Exam Vitals reviewed.  HENT:     Head: Normocephalic.  Cardiovascular:     Rate and Rhythm: Normal rate.     Pulses: Normal pulses.  Pulmonary:     Effort: Pulmonary effort is normal.  Abdominal:     General: Abdomen is flat.     Palpations: Abdomen is soft.  Musculoskeletal:        General: Normal range of motion.  Skin:    General: Skin is warm and dry.  Neurological:     Mental Status: She is alert and oriented to person, place, and time.  Psychiatric:        Mood and Affect: Mood normal.        Behavior: Behavior normal.        Thought Content: Thought content normal.        Judgment: Judgment normal.     BP 115/67   Pulse 90   Ht 5\' 8"  (1.727 m)   Wt 180 lb (81.6 kg)   BMI 27.37 kg/m   Past Medical History:  Diagnosis Date  . Basal cell carcinoma    basal cell -skin  cancer s/p removal  . Breast cancer ([place]) 02/2019   left  . CAD S/P percutaneous coronary angioplasty 2012   Dr. Saralyn Pilar ([place], [place]): DES PCI to LAD  . DM (diabetes mellitus), type 2 with complications (HCC)    CAD  . GERD (gastroesophageal reflux disease)   . Hypercholesterolemia   . Hyperlipidemia associated with type 2 diabetes mellitus ([place])    On lovastatin    Social History   Socioeconomic History  . Marital status: Married    Spouse name: Not on file  . Number of children: Not on file  . Years of education: Not on file  . Highest education level: Not on file  Occupational History  . Occupation: Printmaker: [employer]  Tobacco Use  . Smoking status: Never Smoker  . Smokeless tobacco: Never Used  Vaping Use  . Vaping Use: Never used  Substance and Sexual Activity  . Alcohol use: Not Currently  . Drug use: Never  . Sexual activity: Not on file  Other Topics Concern  . Not on file  Social History Narrative   reports that  she has never smoked. She has never used smokeless tobacco. She reports that she rarely drinks alcohol. She reports that she does not use drugs.      Married to [name] ([name]) [name]    Social Determinants of Health   Financial Resource Strain: Not on file  Food Insecurity: Not on file  Transportation Needs: Not on file  Physical Activity: Not on file  Stress: Not on file  Social Connections: Not on file  Intimate Partner Violence: Not on file    Past Surgical History:  Procedure Laterality Date  . BREAST BIOPSY Left 03/11/2019   stereo biopsy/ x clip/path pending  . BREAST RECONSTRUCTION WITH PLACEMENT OF TISSUE EXPANDER AND FLEX HD (ACELLULAR HYDRATED DERMIS) Left 05/03/2019   Procedure: LEFT BREAST RECONSTRUCTION WITH PLACEMENT OF TISSUE EXPANDER AND FLEX HD (ACELLULAR HYDRATED DERMIS);  Surgeon: Wallace Going, DO;  Location: [facility];  Service: Plastics;  Laterality: Left;  . CARDIAC  CATHETERIZATION    . CHOLECYSTECTOMY    . COLONOSCOPY    . CORONARY STENT INTERVENTION  2012   Dr. Saralyn Pilar ([place]): DES PCI pLAD  . LIPOSUCTION WITH LIPOFILLING Bilateral 01/27/2020   Procedure: LIPOSUCTION WITH LIPOFILLING;  Surgeon: Wallace Going, DO;  Location: [place];  Service: Plastics;  Laterality: Bilateral;  90 min  . MASTECTOMY Left 02/2019  . MASTOPEXY Right 07/01/2019   Procedure: MASTOPEXY;  Surgeon: Wallace Going, DO;  Location: [place];  Service: Plastics;  Laterality: Right;  . NM GATED MYOVIEW (ARMX HX)  05/09/2017   [facility]) exercised 8:36 min with no chest pain or dyspnea.  No EKG changes.  Normal EF 66%.  No ischemia or infarction)  . REMOVAL OF TISSUE EXPANDER AND PLACEMENT OF IMPLANT Left 07/01/2019   Procedure: REMOVAL OF TISSUE EXPANDER AND PLACEMENT OF IMPLANT;  Surgeon: Wallace Going, DO;  Location: [place];  Service: Plastics;  Laterality: Left;  total case 2.5 hours  . TOTAL MASTECTOMY Left 05/03/2019   Procedure: TOTAL MASTECTOMY W/ Sentinel Node;  Surgeon: Herbert Pun, MD;  Location: [facility];  Service: General;  Laterality: Left;    Family History  Problem Relation Age of Onset  . CAD Mother   . Diabetes Mellitus II Mother   . Hypertension Mother   . Uterine cancer Mother   . CAD Father   . Heart attack Father   . Hypertension Father   . Diabetes Mellitus II Father        Took insulin  . Alcohol abuse Father   . Diabetes Mellitus II Sister   . Diabetes Other        Almost all sisters are diabetic or pre-diabetic   . Breast cancer Neg Hx     Allergies  Allergen Reactions  . Exenatide Nausea Only  . Orlistat     Other reaction(s): Unknown  . Penicillins Hives and Swelling  . Thiazide-Type Diuretics     Other reaction(s): Unknown    CBC Latest Ref Rng & Units 09/28/2019 06/29/2019 03/23/2019  WBC 4.0 - 10.5 K/uL 6.2 5.4 5.3   Hemoglobin 12.0 - 15.0 g/dL 14.1 14.1 14.2  Hematocrit 36.0 - 46.0 % 41.8 43.0 44.2  Platelets 150 - 400 K/uL 244 246 227      CMP     Component Value Date/Time   NA 138 01/24/2020 1242   K 4.7 01/24/2020 1242   CL 102 01/24/2020 1242   CO2 24 01/24/2020 1242  GLUCOSE 135 (H) 01/24/2020 1242   BUN 15 01/24/2020 1242   CREATININE 0.80 03/02/2020 0905   CALCIUM 9.6 01/24/2020 1242   PROT 7.5 09/28/2019 1344   ALBUMIN 4.4 09/28/2019 1344   AST 29 09/28/2019 1344   ALT 24 09/28/2019 1344   ALKPHOS 63 09/28/2019 1344   BILITOT 0.6 09/28/2019 1344   GFRNONAA >60 01/24/2020 1242   GFRAA >60 09/28/2019 1344     No results found.     Assessment & Plan:   1. Superior mesenteric artery stenosis (HCC) Recommend:  The patient has evidence of severe atherosclerotic changes of the mesenteric arteries associated with consistent abdominal pian.  This represents a high risk for bowel infarction.  Patient should undergo angiography of the mesenteric arteries with the hope for intervention to eliminate the ischemic symptoms.    The risks and benefits as well as the alternative therapies was discussed in detail with the patient.  All questions were answered.  Patient agrees to proceed with angiography and intervention.  The patient will follow up with me after the angiogram.  2. Hyperlipidemia, unspecified hyperlipidemia type Continue statin as ordered and reviewed, no changes at this time    Current Outpatient Medications on File Prior to Visit  Medication Sig Dispense Refill  . anastrozole (ARIMIDEX) 1 MG tablet Take 1 tablet (1 mg total) by mouth daily. 90 tablet 0  . CALCIUM-MAGNESIUM-ZINC PO Take 1 tablet by mouth every evening.     . clopidogrel (PLAVIX) 75 MG tablet Take 75 mg by mouth daily.     . diazepam (VALIUM) 2 MG tablet Take 1 tablet (2 mg total) by mouth every 12 (twelve) hours as needed for muscle spasms. 20 tablet 0  . glimepiride (AMARYL) 4 MG tablet Take 4 mg  by mouth 2 (two) times daily.     Marland Kitchen glucose blood (CONTOUR NEXT TEST) test strip Use once daily e11.65    . JANUMET 50-500 MG tablet Take 1 tablet by mouth 2 (two) times daily.    Marland Kitchen JARDIANCE 25 MG TABS tablet Take 25 mg by mouth daily.     . lansoprazole (PREVACID) 15 MG capsule Take 15 mg by mouth daily.     Marland Kitchen lovastatin (MEVACOR) 40 MG tablet Take 40 mg by mouth at bedtime.     . metFORMIN (GLUCOPHAGE) 500 MG tablet Take 500 mg by mouth 2 (two) times daily.     . Multiple Vitamin (MULTIVITAMIN) tablet Take 1 tablet by mouth daily.    . ondansetron (ZOFRAN) 4 MG tablet Take 1 tablet (4 mg total) by mouth every 8 (eight) hours as needed for nausea or vomiting. 20 tablet 0  . Vitamin D, Cholecalciferol, 25 MCG (1000 UT) TABS Take by mouth.     No current facility-administered medications on file prior to visit.    There are no Patient Instructions on file for this visit. No follow-ups on file.   Kris Hartmann, NP

## 2020-04-10 NOTE — Progress Notes (Signed)
Subjective:    Patient ID: Tammy Elliott, female    DOB: 03-24-1950, 70 y.o.   MRN: [id number] Chief Complaint  Patient presents with  . Follow-up    Pt conv. Mesenteric     The patient presents today for follow-up studies following a CT scan that raised the possibility of having ischemic colitis.  In the CT scan for review of the visceral vessels was not exactly possible.  Patient describes a diffuse abdominal pain.  She denies any food phobia or worsening of her abdominal pain after eating.  She denies any nausea or vomiting.  She notes the pain has a sort of dull ache.  She denies any fever, chills, nausea, vomiting or diarrhea.  Today noninvasive studies show a normal celiac artery, splenic artery hepatic artery findings.  There is a 70 to 99% stenosis seen in the superior mesenteric artery.  The velocities in the proximal SFA estimate a greater than 70 to 99% stenosis.  The largest aortic diameter is 2.3 cm.     Review of Systems  Gastrointestinal: Positive for abdominal pain. Negative for nausea and vomiting.  All other systems reviewed and are negative.      Objective:   Physical Exam Vitals reviewed.  HENT:     Head: Normocephalic.  Cardiovascular:     Rate and Rhythm: Normal rate.     Pulses: Normal pulses.  Pulmonary:     Effort: Pulmonary effort is normal.  Abdominal:     General: Abdomen is flat.     Palpations: Abdomen is soft.  Musculoskeletal:        General: Normal range of motion.  Skin:    General: Skin is warm and dry.  Neurological:     Mental Status: She is alert and oriented to person, place, and time.  Psychiatric:        Mood and Affect: Mood normal.        Behavior: Behavior normal.        Thought Content: Thought content normal.        Judgment: Judgment normal.     BP 115/67   Pulse 90   Ht 5\' 8"  (1.727 m)   Wt 180 lb (81.6 kg)   BMI 27.37 kg/m   Past Medical History:  Diagnosis Date  . Basal cell carcinoma    basal cell -skin  cancer s/p removal  . Breast cancer ([place]) 02/2019   left  . CAD S/P percutaneous coronary angioplasty 2012   Dr. Saralyn Pilar ([place], [place]): DES PCI to LAD  . DM (diabetes mellitus), type 2 with complications (HCC)    CAD  . GERD (gastroesophageal reflux disease)   . Hypercholesterolemia   . Hyperlipidemia associated with type 2 diabetes mellitus ([place])    On lovastatin    Social History   Socioeconomic History  . Marital status: Married    Spouse name: Not on file  . Number of children: Not on file  . Years of education: Not on file  . Highest education level: Not on file  Occupational History  . Occupation: Printmaker: [employer]  Tobacco Use  . Smoking status: Never Smoker  . Smokeless tobacco: Never Used  Vaping Use  . Vaping Use: Never used  Substance and Sexual Activity  . Alcohol use: Not Currently  . Drug use: Never  . Sexual activity: Not on file  Other Topics Concern  . Not on file  Social History Narrative   reports that  she has never smoked. She has never used smokeless tobacco. She reports that she rarely drinks alcohol. She reports that she does not use drugs.      Married to [name] ([name]) [name]    Social Determinants of Health   Financial Resource Strain: Not on file  Food Insecurity: Not on file  Transportation Needs: Not on file  Physical Activity: Not on file  Stress: Not on file  Social Connections: Not on file  Intimate Partner Violence: Not on file    Past Surgical History:  Procedure Laterality Date  . BREAST BIOPSY Left 03/11/2019   stereo biopsy/ x clip/path pending  . BREAST RECONSTRUCTION WITH PLACEMENT OF TISSUE EXPANDER AND FLEX HD (ACELLULAR HYDRATED DERMIS) Left 05/03/2019   Procedure: LEFT BREAST RECONSTRUCTION WITH PLACEMENT OF TISSUE EXPANDER AND FLEX HD (ACELLULAR HYDRATED DERMIS);  Surgeon: Wallace Going, DO;  Location: [facility];  Service: Plastics;  Laterality: Left;  . CARDIAC  CATHETERIZATION    . CHOLECYSTECTOMY    . COLONOSCOPY    . CORONARY STENT INTERVENTION  2012   Dr. Saralyn Pilar ([place]): DES PCI pLAD  . LIPOSUCTION WITH LIPOFILLING Bilateral 01/27/2020   Procedure: LIPOSUCTION WITH LIPOFILLING;  Surgeon: Wallace Going, DO;  Location: [place];  Service: Plastics;  Laterality: Bilateral;  90 min  . MASTECTOMY Left 02/2019  . MASTOPEXY Right 07/01/2019   Procedure: MASTOPEXY;  Surgeon: Wallace Going, DO;  Location: [place];  Service: Plastics;  Laterality: Right;  . NM GATED MYOVIEW (ARMX HX)  05/09/2017   [facility]) exercised 8:36 min with no chest pain or dyspnea.  No EKG changes.  Normal EF 66%.  No ischemia or infarction)  . REMOVAL OF TISSUE EXPANDER AND PLACEMENT OF IMPLANT Left 07/01/2019   Procedure: REMOVAL OF TISSUE EXPANDER AND PLACEMENT OF IMPLANT;  Surgeon: Wallace Going, DO;  Location: [place];  Service: Plastics;  Laterality: Left;  total case 2.5 hours  . TOTAL MASTECTOMY Left 05/03/2019   Procedure: TOTAL MASTECTOMY W/ Sentinel Node;  Surgeon: Herbert Pun, MD;  Location: [facility];  Service: General;  Laterality: Left;    Family History  Problem Relation Age of Onset  . CAD Mother   . Diabetes Mellitus II Mother   . Hypertension Mother   . Uterine cancer Mother   . CAD Father   . Heart attack Father   . Hypertension Father   . Diabetes Mellitus II Father        Took insulin  . Alcohol abuse Father   . Diabetes Mellitus II Sister   . Diabetes Other        Almost all sisters are diabetic or pre-diabetic   . Breast cancer Neg Hx     Allergies  Allergen Reactions  . Exenatide Nausea Only  . Orlistat     Other reaction(s): Unknown  . Penicillins Hives and Swelling  . Thiazide-Type Diuretics     Other reaction(s): Unknown    CBC Latest Ref Rng & Units 09/28/2019 06/29/2019 03/23/2019  WBC 4.0 - 10.5 K/uL 6.2 5.4 5.3   Hemoglobin 12.0 - 15.0 g/dL 14.1 14.1 14.2  Hematocrit 36.0 - 46.0 % 41.8 43.0 44.2  Platelets 150 - 400 K/uL 244 246 227      CMP     Component Value Date/Time   NA 138 01/24/2020 1242   K 4.7 01/24/2020 1242   CL 102 01/24/2020 1242   CO2 24 01/24/2020 1242  GLUCOSE 135 (H) 01/24/2020 1242   BUN 15 01/24/2020 1242   CREATININE 0.80 03/02/2020 0905   CALCIUM 9.6 01/24/2020 1242   PROT 7.5 09/28/2019 1344   ALBUMIN 4.4 09/28/2019 1344   AST 29 09/28/2019 1344   ALT 24 09/28/2019 1344   ALKPHOS 63 09/28/2019 1344   BILITOT 0.6 09/28/2019 1344   GFRNONAA >60 01/24/2020 1242   GFRAA >60 09/28/2019 1344     No results found.     Assessment & Plan:   1. Superior mesenteric artery stenosis (HCC) Recommend:  The patient has evidence of severe atherosclerotic changes of the mesenteric arteries associated with consistent abdominal pian.  This represents a high risk for bowel infarction.  Patient should undergo angiography of the mesenteric arteries with the hope for intervention to eliminate the ischemic symptoms.    The risks and benefits as well as the alternative therapies was discussed in detail with the patient.  All questions were answered.  Patient agrees to proceed with angiography and intervention.  The patient will follow up with me after the angiogram.  2. Hyperlipidemia, unspecified hyperlipidemia type Continue statin as ordered and reviewed, no changes at this time    Current Outpatient Medications on File Prior to Visit  Medication Sig Dispense Refill  . anastrozole (ARIMIDEX) 1 MG tablet Take 1 tablet (1 mg total) by mouth daily. 90 tablet 0  . CALCIUM-MAGNESIUM-ZINC PO Take 1 tablet by mouth every evening.     . clopidogrel (PLAVIX) 75 MG tablet Take 75 mg by mouth daily.     . diazepam (VALIUM) 2 MG tablet Take 1 tablet (2 mg total) by mouth every 12 (twelve) hours as needed for muscle spasms. 20 tablet 0  . glimepiride (AMARYL) 4 MG tablet Take 4 mg  by mouth 2 (two) times daily.     Marland Kitchen glucose blood (CONTOUR NEXT TEST) test strip Use once daily e11.65    . JANUMET 50-500 MG tablet Take 1 tablet by mouth 2 (two) times daily.    Marland Kitchen JARDIANCE 25 MG TABS tablet Take 25 mg by mouth daily.     . lansoprazole (PREVACID) 15 MG capsule Take 15 mg by mouth daily.     Marland Kitchen lovastatin (MEVACOR) 40 MG tablet Take 40 mg by mouth at bedtime.     . metFORMIN (GLUCOPHAGE) 500 MG tablet Take 500 mg by mouth 2 (two) times daily.     . Multiple Vitamin (MULTIVITAMIN) tablet Take 1 tablet by mouth daily.    . ondansetron (ZOFRAN) 4 MG tablet Take 1 tablet (4 mg total) by mouth every 8 (eight) hours as needed for nausea or vomiting. 20 tablet 0  . Vitamin D, Cholecalciferol, 25 MCG (1000 UT) TABS Take by mouth.     No current facility-administered medications on file prior to visit.    There are no Patient Instructions on file for this visit. No follow-ups on file.   Kris Hartmann, NP

## 2020-04-17 ENCOUNTER — Telehealth (INDEPENDENT_AMBULATORY_CARE_PROVIDER_SITE_OTHER): Payer: Self-pay

## 2020-04-17 NOTE — Telephone Encounter (Signed)
Spoke with the patient to schedule her for a mesenteric angio with Dr. Lucky Cowboy. Patient was offered to be scheduled for 04/19/20 but declined and stated she had to much to do right now and she would call when she was ready to be scheduled.

## 2020-04-19 DIAGNOSIS — K551 Chronic vascular disorders of intestine: Secondary | ICD-10-CM | POA: Insufficient documentation

## 2020-04-20 ENCOUNTER — Inpatient Hospital Stay: Payer: Medicare Other | Attending: Oncology

## 2020-04-20 ENCOUNTER — Encounter: Payer: Self-pay | Admitting: Oncology

## 2020-04-20 ENCOUNTER — Inpatient Hospital Stay (HOSPITAL_BASED_OUTPATIENT_CLINIC_OR_DEPARTMENT_OTHER): Payer: Medicare Other | Admitting: Oncology

## 2020-04-20 VITALS — BP 130/74 | HR 77 | Temp 97.9°F | Resp 18 | Wt 178.9 lb

## 2020-04-20 DIAGNOSIS — Z170 Estrogen receptor positive status [ER+]: Secondary | ICD-10-CM | POA: Diagnosis not present

## 2020-04-20 DIAGNOSIS — C50812 Malignant neoplasm of overlapping sites of left female breast: Secondary | ICD-10-CM | POA: Diagnosis present

## 2020-04-20 DIAGNOSIS — Z79899 Other long term (current) drug therapy: Secondary | ICD-10-CM | POA: Insufficient documentation

## 2020-04-20 DIAGNOSIS — I2510 Atherosclerotic heart disease of native coronary artery without angina pectoris: Secondary | ICD-10-CM | POA: Insufficient documentation

## 2020-04-20 DIAGNOSIS — R222 Localized swelling, mass and lump, trunk: Secondary | ICD-10-CM

## 2020-04-20 DIAGNOSIS — R232 Flushing: Secondary | ICD-10-CM | POA: Diagnosis not present

## 2020-04-20 DIAGNOSIS — Z833 Family history of diabetes mellitus: Secondary | ICD-10-CM | POA: Insufficient documentation

## 2020-04-20 DIAGNOSIS — Z8249 Family history of ischemic heart disease and other diseases of the circulatory system: Secondary | ICD-10-CM | POA: Diagnosis not present

## 2020-04-20 DIAGNOSIS — Z79811 Long term (current) use of aromatase inhibitors: Secondary | ICD-10-CM | POA: Insufficient documentation

## 2020-04-20 DIAGNOSIS — Z85828 Personal history of other malignant neoplasm of skin: Secondary | ICD-10-CM | POA: Diagnosis not present

## 2020-04-20 LAB — CBC WITH DIFFERENTIAL/PLATELET
Abs Immature Granulocytes: 0.02 10*3/uL (ref 0.00–0.07)
Basophils Absolute: 0 10*3/uL (ref 0.0–0.1)
Basophils Relative: 1 %
Eosinophils Absolute: 0.1 10*3/uL (ref 0.0–0.5)
Eosinophils Relative: 2 %
HCT: 42.1 % (ref 36.0–46.0)
Hemoglobin: 14.3 g/dL (ref 12.0–15.0)
Immature Granulocytes: 0 %
Lymphocytes Relative: 29 %
Lymphs Abs: 1.7 10*3/uL (ref 0.7–4.0)
MCH: 31 pg (ref 26.0–34.0)
MCHC: 34 g/dL (ref 30.0–36.0)
MCV: 91.3 fL (ref 80.0–100.0)
Monocytes Absolute: 0.5 10*3/uL (ref 0.1–1.0)
Monocytes Relative: 8 %
Neutro Abs: 3.7 10*3/uL (ref 1.7–7.7)
Neutrophils Relative %: 60 %
Platelets: 219 10*3/uL (ref 150–400)
RBC: 4.61 MIL/uL (ref 3.87–5.11)
RDW: 12.4 % (ref 11.5–15.5)
WBC: 6.1 10*3/uL (ref 4.0–10.5)
nRBC: 0 % (ref 0.0–0.2)

## 2020-04-20 LAB — COMPREHENSIVE METABOLIC PANEL
ALT: 22 U/L (ref 0–44)
AST: 25 U/L (ref 15–41)
Albumin: 4.2 g/dL (ref 3.5–5.0)
Alkaline Phosphatase: 55 U/L (ref 38–126)
Anion gap: 13 (ref 5–15)
BUN: 17 mg/dL (ref 8–23)
CO2: 24 mmol/L (ref 22–32)
Calcium: 9.4 mg/dL (ref 8.9–10.3)
Chloride: 101 mmol/L (ref 98–111)
Creatinine, Ser: 0.84 mg/dL (ref 0.44–1.00)
GFR, Estimated: >60 mL/min (ref >60)
Glucose, Bld: 162 mg/dL — ABNORMAL HIGH (ref 70–99)
Potassium: 5 mmol/L (ref 3.5–5.1)
Sodium: 138 mmol/L (ref 135–145)
Total Bilirubin: 0.7 mg/dL (ref 0.3–1.2)
Total Protein: 7.3 g/dL (ref 6.5–8.1)

## 2020-04-20 NOTE — Progress Notes (Signed)
Patient has hot flashes and would like to discuss the MD.

## 2020-04-20 NOTE — Progress Notes (Signed)
Hematology/Oncology follow up note [facility] Telephone:([phone]) [phone] [phone]   Patient Care Team: Baxter Hire, MD as PCP - General (Internal Medicine) Leonie Man, MD as PCP - Cardiology (Cardiology) Earlie Server, MD as Consulting Physician (Oncology) Herbert Pun, MD as Consulting Physician (General Surgery) Dillingham, Loel Lofty, DO as Attending Physician (Plastic Surgery) Rico Junker, RN as Registered Nurse Theodore Demark, RN as Registered Nurse  REFERRING PROVIDER: Baxter Hire, MD  CHIEF COMPLAINTS/REASON FOR VISIT:  Follow up of breast cancer  HISTORY OF PRESENTING ILLNESS:   Tammy Elliott is a  70 y.o.  female with PMH listed below was seen in consultation at the request of  Baxter Hire, MD  for evaluation of breast cancer Patient had screening mammogram done on 02/16/2019 which showed left breast asymmetry with distortion in the upper outer quadrant. 03/03/2019 left diagnostic mammogram showed small asymmetry noted on the screening study appears to be a small ill-defined mass with associated subtle distortion.  1:30 position of the left breast.  7 cm from the nipple. Sonographic evaluation of the left axillary showed no enlarged or abnormal lymph nodes. Patient underwent stereotactic core needle biopsy of the mass. 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  Grade 2, ER > 90%, PR 1 to 10%, HER-2 negative. Patient denies any family history of breast cancer. Menarche 70 years old, menopause 70 years old Remote use of OCP for about 10 to 15 years, Never used hormone replacement therapy. Denies any history of radiation to the chest.  03/13/2019 Breast MRI bilateral showed numerous suspicious enhancing masses through out the upper inner and upper outer left breast concerning for multicentric diseases. Suspicious findings span greater  than 6cm in both AP and transverse dimensions.  MRI biopsy of left breast central middle and central anterior mass showed invasive mammary carcinoma, grade 1/2.  # 05/03/2019 patient underwent left total mastectomy, and SLNB.  Pathology showed invasive lobular carcinoma, multifocal, LCIS, 1 lymph node negative for malignancy.  Stage IA, pT2(m) pN0 (sn), Grade 2, Margins are not involved by invasive carcinoma.  ER 95%, PR 30%, HER2 IHC 2+ equivocal, FISH negative.   OncotypeDX recurrence score is 19, patient is postmenopausal, chemotherapy benefit is <1%, I will not offer adjuvant chemotherapy.  Patient also has an resection of skin lesion close to her left breast.-SCC  April 2021 started on Armidex 43m daily.  Patient underwent left breast reconstruction  INTERVAL HISTORY Tammy BALINSKIis a 70y.o. female who has above history reviewed by me today presents for follow up visit for management of breast cancer. Problems and complaints are listed below: Patient takes Arimidex daily.  Intermittent hot flash, manageable.  Otherwise no new complaints   Review of Systems  Constitutional: Negative for appetite change, chills, fatigue and fever.  HENT:   Negative for hearing loss and voice change.   Eyes: Negative for eye problems.  Respiratory: Negative for chest tightness and cough.   Cardiovascular: Negative for chest pain.  Gastrointestinal: Negative for abdominal distention, abdominal pain and blood in stool.  Endocrine: Negative for hot flashes.  Genitourinary: Negative for difficulty urinating and frequency.   Musculoskeletal: Negative for arthralgias.  Skin: Negative for itching and rash.  Neurological: Negative for extremity weakness.  Hematological: Negative for adenopathy.  Psychiatric/Behavioral: Negative for confusion.    MEDICAL HISTORY:  Past Medical History:  Diagnosis Date  . Basal cell carcinoma  basal cell -skin cancer s/p removal  . Breast cancer ([place]) 02/2019    left  . CAD S/P percutaneous coronary angioplasty 2012   Dr. Saralyn Pilar ([place], [place]): DES PCI to LAD  . DM (diabetes mellitus), type 2 with complications (H[facility])    CAD  . GERD (gastroesophageal reflux disease)   . Hypercholesterolemia   . Hyperlipidemia associated with type 2 diabetes mellitus ([place])    On lovastatin    SURGICAL HISTORY: Past Surgical History:  Procedure Laterality Date  . BREAST BIOPSY Left 03/11/2019   stereo biopsy/ x clip/path pending  . BREAST RECONSTRUCTION WITH PLACEMENT OF TISSUE EXPANDER AND FLEX HD (ACELLULAR HYDRATED DERMIS) Left 05/03/2019   Procedure: LEFT BREAST RECONSTRUCTION WITH PLACEMENT OF TISSUE EXPANDER AND FLEX HD (ACELLULAR HYDRATED DERMIS);  Surgeon: Wallace Going, DO;  Location: [facility];  Service: Plastics;  Laterality: Left;  . CARDIAC CATHETERIZATION    . CHOLECYSTECTOMY    . COLONOSCOPY    . CORONARY STENT INTERVENTION  2012   Dr. Saralyn Pilar ([place]): DES PCI pLAD  . LIPOSUCTION WITH LIPOFILLING Bilateral 01/27/2020   Procedure: LIPOSUCTION WITH LIPOFILLING;  Surgeon: Wallace Going, DO;  Location: [place];  Service: Plastics;  Laterality: Bilateral;  90 min  . MASTECTOMY Left 02/2019  . MASTOPEXY Right 07/01/2019   Procedure: MASTOPEXY;  Surgeon: Wallace Going, DO;  Location: [place];  Service: Plastics;  Laterality: Right;  . NM GATED MYOVIEW (ARMX HX)  05/09/2017   [facility]) exercised 8:36 min with no chest pain or dyspnea.  No EKG changes.  Normal EF 66%.  No ischemia or infarction)  . REMOVAL OF TISSUE EXPANDER AND PLACEMENT OF IMPLANT Left 07/01/2019   Procedure: REMOVAL OF TISSUE EXPANDER AND PLACEMENT OF IMPLANT;  Surgeon: Wallace Going, DO;  Location: [place];  Service: Plastics;  Laterality: Left;  total case 2.5 hours  . TOTAL MASTECTOMY Left 05/03/2019   Procedure: TOTAL MASTECTOMY W/ Sentinel  Node;  Surgeon: Herbert Pun, MD;  Location: [facility];  Service: General;  Laterality: Left;    SOCIAL HISTORY: Social History   Socioeconomic History  . Marital status: Married    Spouse name: Not on file  . Number of children: Not on file  . Years of education: Not on file  . Highest education level: Not on file  Occupational History  . Occupation: Printmaker: [employer]  Tobacco Use  . Smoking status: Never Smoker  . Smokeless tobacco: Never Used  Vaping Use  . Vaping Use: Never used  Substance and Sexual Activity  . Alcohol use: Not Currently  . Drug use: Never  . Sexual activity: Not on file  Other Topics Concern  . Not on file  Social History Narrative   reports that she has never smoked. She has never used smokeless tobacco. She reports that she rarely drinks alcohol. She reports that she does not use drugs.      Married to [name] ([name]) [name]    Social Determinants of Health   Financial Resource Strain: Not on file  Food Insecurity: Not on file  Transportation Needs: Not on file  Physical Activity: Not on file  Stress: Not on file  Social Connections: Not on file  Intimate Partner Violence: Not on file    FAMILY HISTORY: Family History  Problem Relation Age of Onset  . CAD Mother   . Diabetes Mellitus II Mother   .  Hypertension Mother   . Uterine cancer Mother   . CAD Father   . Heart attack Father   . Hypertension Father   . Diabetes Mellitus II Father        Took insulin  . Alcohol abuse Father   . Diabetes Mellitus II Sister   . Diabetes Other        Almost all sisters are diabetic or pre-diabetic   . Breast cancer Neg Hx     ALLERGIES:  is allergic to exenatide, orlistat, penicillins, and thiazide-type diuretics.  MEDICATIONS:  Current Outpatient Medications  Medication Sig Dispense Refill  . anastrozole (ARIMIDEX) 1 MG tablet Take 1 tablet (1 mg total) by mouth daily. 90 tablet 0  . CALCIUM-MAGNESIUM-ZINC PO Take 1  tablet by mouth every evening.     . clopidogrel (PLAVIX) 75 MG tablet Take 75 mg by mouth daily.     . diazepam (VALIUM) 2 MG tablet Take 1 tablet (2 mg total) by mouth every 12 (twelve) hours as needed for muscle spasms. 20 tablet 0  . glimepiride (AMARYL) 4 MG tablet Take 4 mg by mouth 2 (two) times daily.     Marland Kitchen glucose blood (CONTOUR NEXT TEST) test strip Use once daily e11.65    . JANUMET 50-500 MG tablet Take 1 tablet by mouth 2 (two) times daily.    Marland Kitchen JARDIANCE 25 MG TABS tablet Take 25 mg by mouth daily.     . lansoprazole (PREVACID) 15 MG capsule Take 15 mg by mouth daily.     Marland Kitchen lovastatin (MEVACOR) 40 MG tablet Take 40 mg by mouth at bedtime.     . metFORMIN (GLUCOPHAGE) 500 MG tablet Take 500 mg by mouth 2 (two) times daily.     . Multiple Vitamin (MULTIVITAMIN) tablet Take 1 tablet by mouth daily.    . ondansetron (ZOFRAN) 4 MG tablet Take 1 tablet (4 mg total) by mouth every 8 (eight) hours as needed for nausea or vomiting. 20 tablet 0  . Vitamin D, Cholecalciferol, 25 MCG (1000 UT) TABS Take by mouth.     No current facility-administered medications for this visit.     PHYSICAL EXAMINATION: ECOG PERFORMANCE STATUS: 0 - Asymptomatic Vitals:   04/20/20 1153  BP: 130/74  Pulse: 77  Resp: 18  Temp: 97.9 F (36.6 C)   Filed Weights   04/20/20 1153  Weight: 178 lb 14.4 oz (81.1 kg)    Physical Exam Constitutional:      General: She is not in acute distress. HENT:     Head: Normocephalic and atraumatic.  Eyes:     General: No scleral icterus.    Pupils: Pupils are equal, round, and reactive to light.  Cardiovascular:     Rate and Rhythm: Normal rate and regular rhythm.     Heart sounds: Normal heart sounds.  Pulmonary:     Effort: Pulmonary effort is normal. No respiratory distress.     Breath sounds: No wheezing.  Abdominal:     General: Bowel sounds are normal. There is no distension.     Palpations: Abdomen is soft. There is no mass.     Tenderness: There  is no abdominal tenderness.  Musculoskeletal:        General: No deformity. Normal range of motion.     Cervical back: Normal range of motion and neck supple.  Skin:    General: Skin is warm and dry.     Findings: No erythema or rash.  Neurological:  Mental Status: She is alert and oriented to person, place, and time. Mental status is at baseline.     Cranial Nerves: No cranial nerve deficit.     Coordination: Coordination normal.  Psychiatric:        Mood and Affect: Mood normal.    LABORATORY DATA:  I have reviewed the data as listed Lab Results  Component Value Date   WBC 6.1 04/20/2020   HGB 14.3 04/20/2020   HCT 42.1 04/20/2020   MCV 91.3 04/20/2020   PLT 219 04/20/2020   Recent Labs    06/28/19 1027 06/29/19 0935 09/28/19 1344 01/24/20 1242 03/02/20 0905 04/20/20 1034  NA 140 138 138 138  --  138  K 4.3 4.4 4.3 4.7  --  5.0  CL 103 101 102 102  --  101  CO2 _0 --  24  GLUCOSE 178* 171* 160* 135*  --  162*  BUN _1 --  17  CREATININE 0.84 0.84 0.90 0.85 0.80 0.84  CALCIUM 9.5 9.5 9.2 9.6  --  9.4  GFRNONAA >60 >60 >60 >60  --  >60  GFRAA >60 >60 >60  --   --   --   PROT  --  7.3 7.5  --   --  7.3  ALBUMIN  --  4.3 4.4  --   --  4.2  AST  --  30 29  --   --  25  ALT  --  29 24  --   --  22  ALKPHOS  --  61 63  --   --  55  BILITOT  --  0.5 0.6  --   --  0.7   Iron/TIBC/Ferritin/ %Sat No results found for: IRON, TIBC, FERRITIN, IRONPCTSAT    RADIOGRAPHIC STUDIES: I have personally reviewed the radiological images as listed and agreed with the findings in the report.  VAS [place] MESENTERIC DUPLEX  Result Date: 04/11/2020 ABDOMINAL VISCERAL Indications: Abdominal Pain Performing Technologist: Almira Coaster RVS  Examination Guidelines: A complete evaluation includes B-mode imaging, spectral Doppler, color Doppler, and power Doppler as needed of all accessible portions of each vessel. Bilateral testing is considered an integral part of a  complete examination. Limited examinations for reoccurring indications may be performed as noted.  Duplex Findings: +----------------------+--------+--------+------+--------+ Mesenteric            PSV cm/sEDV cm/sPlaqueComments +----------------------+--------+--------+------+--------+ Aorta Prox               93      16                  +----------------------+--------+--------+------+--------+ Aorta Mid                94      18                  +----------------------+--------+--------+------+--------+ Aorta Distal             68      0                   +----------------------+--------+--------+------+--------+ Celiac Artery Origin     87      20                  +----------------------+--------+--------+------+--------+ Celiac Artery Proximal  105      25                  +----------------------+--------+--------+------+--------+  SMA Origin              233      43                  +----------------------+--------+--------+------+--------+ SMA Proximal            333      60                  +----------------------+--------+--------+------+--------+ SMA Mid                 169      28                  +----------------------+--------+--------+------+--------+ SMA Distal              174      26                  +----------------------+--------+--------+------+--------+ CHA                      91      20                  +----------------------+--------+--------+------+--------+ Splenic                  85      16                  +----------------------+--------+--------+------+--------+    Summary: Largest Aortic Diameter: 2.3 cm  Mesenteric: Normal Celiac artery , Splenic artery and Hepatic artery findings. 70 to 99% stenosis in the superior mesenteric artery. Elevated Velocities seen in the Proximal SMA suggestive of >70-99% stenosis.  *See table(s) above for measurements and observations.  Diagnosing physician: Leotis Pain MD   Electronically signed by Leotis Pain MD on 04/11/2020 at 11:51:42 AM.    Final       ASSESSMENT & PLAN:  1. Malignant neoplasm of overlapping sites of left breast in female, estrogen receptor positive ([place])   2. Supraclavicular fossa fullness   3. Aromatase inhibitor use   Cancer Staging Malignant neoplasm of upper-outer quadrant of left breast in female, estrogen receptor positive ([place]) Staging form: Breast, AJCC 8th Edition - Clinical: Stage IA (cT1b, cN0, cM0, G2, ER+, PR+, HER2-) - Signed by Earlie Server, MD on 03/23/2019 - Pathologic: Stage IA (pT2, pN0, cM0, G2, ER+, PR+, HER2-, Oncotype DX score: 19) - Signed by Earlie Server, MD on 05/29/2019  1. Malignant neoplasm of overlapping sites of left breast in female, estrogen receptor positive ([place])   2. Supraclavicular fossa fullness   3. Aromatase inhibitor use    # Left Stage IA breast cancer, She tolerates adjuvant endocrine therapy with Arimidex 35m daily.  Labs are reviewed and discussed with patient. Continue Arimidex. Discussed with patient about healthy weight loss, exercise as tolerated, calcium and vitamin D supplementation. Her DEXA was normal.  Recommend DEXA every 2 years.  Left supraclavicular fossa fullness.  I will check an ultrasound neck soft tissue.  Patient is due for annual right screening mammogram in December 2021.  Previous DEXA is normal.  Continue calcium and vitamin D supplementation .  Follow-up in 6 months.   Orders Placed This Encounter  Procedures  . [place] Tissue Head/Neck    Standing Status:   Future    Standing Expiration Date:   04/20/2021    Order Specific Question:   Reason for Exam (SYMPTOM  OR DIAGNOSIS REQUIRED)  Answer:   left supraclavicular fullness, history of breast cancer    Order Specific Question:   Preferred imaging location?    Answer:   [facility]    All questions were answered. The patient knows to call the clinic with any problems questions or concerns.  Earlie Server, MD,  PhD Hematology Oncology [facility] Pager- [phone] 04/20/2020

## 2020-04-24 NOTE — Telephone Encounter (Signed)
Patient is scheduled with Dr. Lucky Cowboy for a mesenteric angio on 05/01/20 with a 9:00 am arrival time to the [facility]. Covid testing on 04/27/20 between 8-1 pm at the [place]. Pre-procedure instructions were discussed and will be mailed.

## 2020-04-25 ENCOUNTER — Other Ambulatory Visit: Payer: Self-pay

## 2020-04-25 ENCOUNTER — Ambulatory Visit
Admission: RE | Admit: 2020-04-25 | Discharge: 2020-04-25 | Disposition: A | Discharge status: Home / Self Care | Payer: Medicare Other | Source: Ambulatory Visit | Attending: Oncology | Admitting: Oncology

## 2020-04-25 DIAGNOSIS — C50812 Malignant neoplasm of overlapping sites of left female breast: Secondary | ICD-10-CM | POA: Insufficient documentation

## 2020-04-25 DIAGNOSIS — R222 Localized swelling, mass and lump, trunk: Secondary | ICD-10-CM | POA: Diagnosis present

## 2020-04-25 DIAGNOSIS — Z170 Estrogen receptor positive status [ER+]: Secondary | ICD-10-CM | POA: Insufficient documentation

## 2020-04-27 ENCOUNTER — Other Ambulatory Visit
Admission: RE | Admit: 2020-04-27 | Discharge: 2020-04-27 | Disposition: A | Discharge status: Home / Self Care | Payer: Medicare Other | Source: Ambulatory Visit | Attending: Vascular Surgery | Admitting: Vascular Surgery

## 2020-04-27 ENCOUNTER — Other Ambulatory Visit: Payer: Self-pay

## 2020-04-27 DIAGNOSIS — Z01812 Encounter for preprocedural laboratory examination: Secondary | ICD-10-CM | POA: Insufficient documentation

## 2020-04-27 DIAGNOSIS — Z20822 Contact with and (suspected) exposure to covid-19: Secondary | ICD-10-CM | POA: Insufficient documentation

## 2020-04-27 LAB — SARS CORONAVIRUS 2 (TAT 6-24 HRS): SARS Coronavirus 2: NEGATIVE

## 2020-05-01 ENCOUNTER — Other Ambulatory Visit: Payer: Self-pay

## 2020-05-01 ENCOUNTER — Encounter: Payer: Self-pay | Admitting: Vascular Surgery

## 2020-05-01 ENCOUNTER — Other Ambulatory Visit (INDEPENDENT_AMBULATORY_CARE_PROVIDER_SITE_OTHER): Payer: Self-pay | Admitting: Nurse Practitioner

## 2020-05-01 ENCOUNTER — Encounter
Admission: RE | Disposition: A | Discharge status: Home / Self Care | Payer: Self-pay | Source: Ambulatory Visit | Attending: Vascular Surgery

## 2020-05-01 ENCOUNTER — Ambulatory Visit
Admission: RE | Admit: 2020-05-01 | Discharge: 2020-05-01 | Disposition: A | Discharge status: Home / Self Care | Payer: Medicare Other | Source: Ambulatory Visit | Attending: Vascular Surgery | Admitting: Vascular Surgery

## 2020-05-01 DIAGNOSIS — Z7984 Long term (current) use of oral hypoglycemic drugs: Secondary | ICD-10-CM | POA: Diagnosis not present

## 2020-05-01 DIAGNOSIS — Z79899 Other long term (current) drug therapy: Secondary | ICD-10-CM | POA: Diagnosis not present

## 2020-05-01 DIAGNOSIS — Z888 Allergy status to other drugs, medicaments and biological substances status: Secondary | ICD-10-CM | POA: Insufficient documentation

## 2020-05-01 DIAGNOSIS — Z880 Allergy status to penicillin: Secondary | ICD-10-CM | POA: Insufficient documentation

## 2020-05-01 DIAGNOSIS — R1084 Generalized abdominal pain: Secondary | ICD-10-CM

## 2020-05-01 DIAGNOSIS — E785 Hyperlipidemia, unspecified: Secondary | ICD-10-CM | POA: Diagnosis not present

## 2020-05-01 DIAGNOSIS — K551 Chronic vascular disorders of intestine: Secondary | ICD-10-CM | POA: Diagnosis not present

## 2020-05-01 DIAGNOSIS — Z7902 Long term (current) use of antithrombotics/antiplatelets: Secondary | ICD-10-CM | POA: Insufficient documentation

## 2020-05-01 HISTORY — PX: VISCERAL ANGIOGRAPHY: CATH118276

## 2020-05-01 LAB — GLUCOSE, CAPILLARY
Glucose-Capillary: 153 mg/dL — ABNORMAL HIGH (ref 70–99)
Glucose-Capillary: 162 mg/dL — ABNORMAL HIGH (ref 70–99)

## 2020-05-01 SURGERY — VISCERAL ANGIOGRAPHY
Anesthesia: Moderate Sedation

## 2020-05-01 MED ORDER — MIDAZOLAM HCL 2 MG/2ML IJ SOLN
INTRAMUSCULAR | Status: AC
  Administered 2020-05-01: 2 mg
  Filled 2020-05-01: qty 2

## 2020-05-01 MED ORDER — CLINDAMYCIN PHOSPHATE 300 MG/50ML IV SOLN
INTRAVENOUS | Status: AC
  Filled 2020-05-01: qty 50

## 2020-05-01 MED ORDER — FAMOTIDINE 20 MG PO TABS: 40.0000 mg | ORAL_TABLET | Freq: Once | ORAL | Status: DC | PRN

## 2020-05-01 MED ORDER — DIPHENHYDRAMINE HCL 50 MG/ML IJ SOLN: 50.0000 mg | Freq: Once | INTRAMUSCULAR | Status: DC | PRN

## 2020-05-01 MED ORDER — IODIXANOL 320 MG/ML IV SOLN
INTRAVENOUS | Status: DC | PRN
  Administered 2020-05-01: 40 mL

## 2020-05-01 MED ORDER — FENTANYL CITRATE (PF) 100 MCG/2ML IJ SOLN
INTRAMUSCULAR | Status: AC
  Administered 2020-05-01: 50 ug
  Filled 2020-05-01: qty 2

## 2020-05-01 MED ORDER — HYDROMORPHONE HCL 1 MG/ML IJ SOLN: 1.0000 mg | Freq: Once | INTRAMUSCULAR | Status: DC | PRN

## 2020-05-01 MED ORDER — MIDAZOLAM HCL 2 MG/ML PO SYRP: 8.0000 mg | ORAL_SOLUTION | Freq: Once | ORAL | Status: DC | PRN

## 2020-05-01 MED ORDER — CLINDAMYCIN PHOSPHATE 300 MG/50ML IV SOLN
300.0000 mg | Freq: Once | INTRAVENOUS | Status: AC
  Administered 2020-05-01: 300 mg via INTRAVENOUS

## 2020-05-01 MED ORDER — SODIUM CHLORIDE 0.9 % IV SOLN: INTRAVENOUS | Status: DC

## 2020-05-01 MED ORDER — ONDANSETRON HCL 4 MG/2ML IJ SOLN: 4.0000 mg | Freq: Four times a day (QID) | INTRAMUSCULAR | Status: DC | PRN

## 2020-05-01 MED ORDER — METHYLPREDNISOLONE SODIUM SUCC 125 MG IJ SOLR: 125.0000 mg | Freq: Once | INTRAMUSCULAR | Status: DC | PRN

## 2020-05-01 SURGICAL SUPPLY — 10 items
CANNULA 5F STIFF (CANNULA) ×2 IMPLANT
CATH ANGIO 5F 80CM MHK1-H (CATHETERS) ×2 IMPLANT
CATH ANGIO 5F PIGTAIL 65CM (CATHETERS) ×2 IMPLANT
DEVICE STARCLOSE SE CLOSURE (Vascular Products) ×2 IMPLANT
GLIDEWIRE STIFF .35X180X3 HYDR (WIRE) ×2 IMPLANT
PACK ANGIOGRAPHY (CUSTOM PROCEDURE TRAY) ×2 IMPLANT
SHEATH BRITE TIP 5FRX11 (SHEATH) ×2 IMPLANT
SYR MEDRAD MARK 7 150ML (SYRINGE) ×2 IMPLANT
TUBING CONTRAST HIGH PRESS 72 (TUBING) ×2 IMPLANT
WIRE GUIDERIGHT .035X150 (WIRE) ×2 IMPLANT

## 2020-05-01 NOTE — Interval H&P Note (Signed)
History and Physical Interval Note:  05/01/2020 9:33 AM  Tammy Elliott  has presented today for surgery, with the diagnosis of Mesenteric Angio   Mesenteric artery stenosis Covid  Feb 17.  The various methods of treatment have been discussed with the patient and family. After consideration of risks, benefits and other options for treatment, the patient has consented to  Procedure(s): VISCERAL ANGIOGRAPHY (N/A) as a surgical intervention.  The patient's history has been reviewed, patient examined, no change in status, stable for surgery.  I have reviewed the patient's chart and labs.  Questions were answered to the patient's satisfaction.     Leotis Pain

## 2020-05-01 NOTE — Op Note (Signed)
[facility] Percutaneous Study/Intervention Procedural Note   Date: 05/01/2020  Surgeon(s): Leotis Pain, MD  Assistants: none  Pre-operative Diagnosis: 1.  Abdominal pain worrisome for chronic mesenteric ischemia 2.  Plaque suggesting superior mesenteric artery stenosis   Post-operative diagnosis: Same  Procedure(s) Performed: 1. Ultrasound guidance for vascular access right femoral artery  2. Catheter placement into the celiac artery and the superior mesenteric artery from right femoral approach 3. Aortogram and selective angiogram of the celiac artery and superior mesenteric artery 4. StarClose closure device right femoral artery  Contrast: 40  Fluoro time: 2.7 minutes  EBL: 10 cc  Anesthesia: Approximately 22 minutes of Moderate conscious sedation using 2 mg of Versed and 50 mcg of Fentanyl  Indications: Patient is a 70 y.o. female who has symptoms abdominal pain which were not entirely clear in their etiology, but there was concern for possible chronic mesenteric ischemia. The patient has a duplex showing elevated velocities in the superior mesenteric artery worrisome for high-grade stenosis. The patient is brought in for angiography for further evaluation and potential treatment. Risks and benefits are discussed and informed consent is obtained  Procedure: The patient was identified and appropriate procedural time out was performed. The patient was then placed supine on the table and prepped and draped in the usual sterile fashion. Moderate conscious sedation was administered during a face to face encounter with the patient throughout the procedure with my supervision of the RN administering medicines and monitoring the patient's vital signs, pulse oximetry, telemetry and mental status throughout from the start of the procedure until the patient was taken to the recovery room. Ultrasound  was used to evaluate the right common femoral artery. It was patent . A digital ultrasound image was acquired. A Seldinger needle was used to access the right common femoral artery under direct ultrasound guidance and a permanent image was performed. A 0.035 J wire was advanced without resistance and a 5Fr sheath was placed. Pigtail catheter was placed into the aorta and an AP aortogram was performed. This demonstrated fairly normal renal arteries and fairly normal aorta and iliac arteries without obvious stenosis.  The origins of the celiac artery and SMA were not well seen in the AP projection but there was flow through both vessels. We transitioned to the lateral projection to image the celiac and SMA. The lateral image demonstrated calcification at the origins of the celiac artery and SMA with what appeared to be fairly mild stenosis and decent flow present.  I elected to selectively cannulate both vessels for further evaluation. A VS1 catheter was used to first cannulate the celiac artery where images in both the lateral and AP projection was performed.  This demonstrated about a 20 to 25% stenosis of the celiac artery which was not flow-limiting.  I then used the V S1 catheter to selectively cannulate superior mesenteric artery where selective imaging was performed in both the lateral and AP projection.  This demonstrated about a 20% stenosis in the proximal SMA which was not flow-limiting.  At this point, I elected to terminate the procedure. The diagnostic catheter was removed. StarClose closure device was deployed in usual fashion with excellent hemostatic result. The patient was taken to the recovery room in stable condition having tolerated the procedure well.     Findings:20 to 25% stenosis in the celiac artery, 20% stenosis in the SMA, neither of which were flow-limiting or required intervention.  Disposition: Patient was taken to the recovery room in stable condition having  tolerated the  procedure well.  Complications: None  Leotis Pain 05/01/2020 10:21 AM   This note was created with Dragon Medical transcription system. Any errors in dictation are purely unintentional.

## 2020-05-01 NOTE — Discharge Instructions (Signed)
Angiogram, Care After This sheet gives you information about how to care for yourself after your procedure. Your health care provider may also give you more specific instructions. If you have problems or questions, contact your health care provider. What can I expect after the procedure? After the procedure, it is common to have:  Bruising and tenderness at the catheter insertion area.  A collection of blood (hematoma) at the insertion area. This may feel like a small lump under the skin at the insertion site. Follow these instructions at home: Insertion site care  Follow instructions from your health care provider about how to take care of your insertion site. Make sure you: ? Wash your hands with soap and water before and after you change your bandage (dressing). If soap and water are not available, use hand sanitizer. ? Change your dressing as told by your health care provider.  Do not take baths, swim, or use a hot tub until your health care provider approves.  You may shower 24-48 hours after the procedure, or as told by your health care provider. To clean the insertion site: ? Gently wash the area with plain soap and water. ? Pat the area dry with a clean towel. ? Do not rub the site. This may cause bleeding.  Check your insertion site every day for signs of infection. Check for: ? Redness, swelling, or pain. ? Fluid or blood. ? Warmth. ? Pus or a bad smell.  Do not apply powder or lotion to the site. Keep the site clean and dry.   Activity  Do not drive for 24 hours if you were given a sedative during your procedure.  Rest as told by your health care provider, usually for 1-2 days.  Do not lift anything that is heavier than 10 lb (4.5 kg), or the limit that you are told, until your health care provider says that it is safe.  If the insertion site was in your leg, try to avoid stairs for a few days.  Return to your normal activities as told by your health care provider,  usually in about a week. Ask your health care provider what activities are safe for you. General instructions  If your insertion site starts bleeding, lie flat and put pressure on the site. If the bleeding does not stop, get help right away. This is a medical emergency.  Take over-the-counter and prescription medicines only as told by your health care provider.  Drink enough fluid to keep your urine pale yellow. This helps flush the contrast dye from your body.  Keep all follow-up visits as told by your health care provider. This is important.   Contact a health care provider if:  You have a fever or chills.  You have redness, swelling, or pain around your insertion site.  You have fluid or blood coming from your insertion site.  Your insertion site feels warm to the touch.  You have pus or a bad smell coming from your insertion site.  You have more bruising around the insertion site. Get help right away if you have:  A problem with the insertion area, such as: ? The area swells fast or bleeds even after you apply pressure. ? The area becomes pale, cool, tingly, or numb.  Chest pain.  Trouble breathing.  A rash.  Any symptoms of a stroke. "BE FAST" is an easy way to remember the main warning signs of a stroke: ? B - Balance. Signs are dizziness, sudden trouble walking,   or loss of balance. ? E - Eyes. Signs are trouble seeing or a sudden change in vision. ? F - Face. Signs are sudden weakness or loss of feeling of the face, or the face or eyelid drooping on one side. ? A - Arms. Signs are weakness or loss of feeling in an arm. This happens suddenly and usually on one side of the body. ? S - Speech. Signs are sudden trouble speaking, slurred speech, or trouble understanding what people say. ? T - Time. Time to call emergency services. Write down what time symptoms started.  You have other signs of a stroke, such as: ? A sudden, severe headache with no known cause. ? Nausea  or vomiting. ? Seizure. These symptoms may represent a serious problem that is an emergency. Do not wait to see if the symptoms will go away. Get medical help right away. Call your local emergency services (911 in the [place] Do not drive yourself to the hospital. Summary  It is common to have bruising and tenderness at the catheter insertion area.  Do not take baths, swim, or use a hot tub until your health care provider approves. You may shower 24-48 hours after the procedure or as told.  It is important to rest and drink plenty of fluids.  If the insertion site bleeds, lie flat and put pressure on the site. If the bleeding continues, get help right away. This is a medical emergency. This information is not intended to replace advice given to you by your health care provider. Make sure you discuss any questions you have with your health care provider. Document Revised: 12/30/2018 Document Reviewed: 12/30/2018 Elsevier Patient Education  2021 Elsevier Inc.  

## 2020-05-04 ENCOUNTER — Other Ambulatory Visit: Payer: Self-pay | Admitting: *Deleted

## 2020-05-04 MED ORDER — ANASTROZOLE 1 MG PO TABS: 1.0000 mg | ORAL_TABLET | Freq: Every day | ORAL | 1 refills | Status: DC

## 2020-05-04 NOTE — Addendum Note (Signed)
Addended by: Betti Cruz on: 05/04/2020 02:47 PM   Modules accepted: Orders

## 2020-05-04 NOTE — Telephone Encounter (Signed)
Patient called back requesting her prescription [facility] of [facility]

## 2020-05-22 ENCOUNTER — Encounter (INDEPENDENT_AMBULATORY_CARE_PROVIDER_SITE_OTHER): Payer: Self-pay | Admitting: Nurse Practitioner

## 2020-05-22 ENCOUNTER — Other Ambulatory Visit: Payer: Self-pay

## 2020-05-22 ENCOUNTER — Ambulatory Visit (INDEPENDENT_AMBULATORY_CARE_PROVIDER_SITE_OTHER): Payer: Medicare Other | Admitting: Nurse Practitioner

## 2020-05-22 VITALS — BP 133/74 | HR 78 | Resp 16 | Wt 178.8 lb

## 2020-05-22 DIAGNOSIS — E785 Hyperlipidemia, unspecified: Secondary | ICD-10-CM | POA: Diagnosis not present

## 2020-05-22 DIAGNOSIS — R1030 Lower abdominal pain, unspecified: Secondary | ICD-10-CM

## 2020-05-28 ENCOUNTER — Encounter (INDEPENDENT_AMBULATORY_CARE_PROVIDER_SITE_OTHER): Payer: Self-pay | Admitting: Nurse Practitioner

## 2020-05-28 NOTE — Progress Notes (Signed)
Subjective:    Patient ID: Tammy Elliott, female    DOB: 1950/08/30, 70 y.o.   MRN: [id number] Chief Complaint  Patient presents with  . Follow-up    [facility] 3wk post visceral angio    Tammy Elliott is a 70 year old female that presents today following mesenteric angiogram on 05/01/2020.  Currently the patient reports no issues post angiogram other than frustration.  The patient initially presented due to lower abdominal pain.  A CT scan ordered by her primary care physician noted concern for possible ischemic colitis.  The patient has evidence of atherosclerosis within her aorta and some of the larger branch vessels.  Due to the type of CT scan it was difficult to evaluate the degree of stenosis within the visceral vessels.  This scan was independently reviewed by Dr. Lucky Cowboy.  The patient returns for follow-up for evaluation by mesenteric duplex.  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  It indicated the velocities in the proximal SMA were greater than a 70 to 99% stenosis.  During the mesenteric angiogram it was noted that there was a,20 to 25% stenosis of the celiac artery as well as a 20% stenosis of the SMA neither of which would be flow-limiting.  No intervention was indicated at that time.   Review of Systems  All other systems reviewed and are negative.      Objective:   Physical Exam Vitals reviewed.  HENT:     Head: Normocephalic.  Cardiovascular:     Rate and Rhythm: Normal rate and regular rhythm.  Pulmonary:     Effort: Pulmonary effort is normal.  Neurological:     Mental Status: She is alert and oriented to person, place, and time.  Psychiatric:        Mood and Affect: Mood normal.        Behavior: Behavior normal.        Thought Content: Thought content normal.        Judgment: Judgment normal.     BP 133/74 (BP Location: Right Arm)   Pulse 78   Resp 16   Wt 178 lb 12.8 oz (81.1 kg)   BMI 27.19 kg/m   Past Medical History:  Diagnosis Date  . Basal  cell carcinoma    basal cell -skin cancer s/p removal  . Breast cancer ([place]) 02/2019   left  . CAD S/P percutaneous coronary angioplasty 2012   Dr. Saralyn Pilar ([place], [place]): DES PCI to LAD  . DM (diabetes mellitus), type 2 with complications ([facility])    CAD  . GERD (gastroesophageal reflux disease)   . Hypercholesterolemia   . Hyperlipidemia associated with type 2 diabetes mellitus ([place])    On lovastatin    Social History   Socioeconomic History  . Marital status: Married    Spouse name: Not on file  . Number of children: Not on file  . Years of education: Not on file  . Highest education level: Not on file  Occupational History  . Occupation: Printmaker: [employer]  Tobacco Use  . Smoking status: Never Smoker  . Smokeless tobacco: Never Used  Vaping Use  . Vaping Use: Never used  Substance and Sexual Activity  . Alcohol use: Not Currently  . Drug use: Never  . Sexual activity: Not on file  Other Topics Concern  . Not on file  Social History Narrative   reports that she has never smoked. She has never used smokeless  tobacco. She reports that she rarely drinks alcohol. She reports that she does not use drugs.      Married to [name] ([name]) [name]    Social Determinants of Health   Financial Resource Strain: Not on file  Food Insecurity: Not on file  Transportation Needs: Not on file  Physical Activity: Not on file  Stress: Not on file  Social Connections: Not on file  Intimate Partner Violence: Not on file    Past Surgical History:  Procedure Laterality Date  . BREAST BIOPSY Left 03/11/2019   stereo biopsy/ x clip/path pending  . BREAST RECONSTRUCTION WITH PLACEMENT OF TISSUE EXPANDER AND FLEX HD (ACELLULAR HYDRATED DERMIS) Left 05/03/2019   Procedure: LEFT BREAST RECONSTRUCTION WITH PLACEMENT OF TISSUE EXPANDER AND FLEX HD (ACELLULAR HYDRATED DERMIS);  Surgeon: Wallace Going, DO;  Location: [facility];  Service: Plastics;   Laterality: Left;  . CARDIAC CATHETERIZATION    . CHOLECYSTECTOMY    . COLONOSCOPY    . CORONARY STENT INTERVENTION  2012   Dr. Saralyn Pilar ([place]): DES PCI pLAD  . LIPOSUCTION WITH LIPOFILLING Bilateral 01/27/2020   Procedure: LIPOSUCTION WITH LIPOFILLING;  Surgeon: Wallace Going, DO;  Location: [place];  Service: Plastics;  Laterality: Bilateral;  90 min  . MASTECTOMY Left 02/2019  . MASTOPEXY Right 07/01/2019   Procedure: MASTOPEXY;  Surgeon: Wallace Going, DO;  Location: [place];  Service: Plastics;  Laterality: Right;  . NM GATED MYOVIEW (ARMX HX)  05/09/2017   [facility]) exercised 8:36 min with no chest pain or dyspnea.  No EKG changes.  Normal EF 66%.  No ischemia or infarction)  . REMOVAL OF TISSUE EXPANDER AND PLACEMENT OF IMPLANT Left 07/01/2019   Procedure: REMOVAL OF TISSUE EXPANDER AND PLACEMENT OF IMPLANT;  Surgeon: Wallace Going, DO;  Location: [place];  Service: Plastics;  Laterality: Left;  total case 2.5 hours  . TOTAL MASTECTOMY Left 05/03/2019   Procedure: TOTAL MASTECTOMY W/ Sentinel Node;  Surgeon: Herbert Pun, MD;  Location: [facility];  Service: General;  Laterality: Left;  Marland Kitchen VISCERAL ANGIOGRAPHY N/A 05/01/2020   Procedure: VISCERAL ANGIOGRAPHY;  Surgeon: Algernon Huxley, MD;  Location: [place] [facility];  Service: Cardiovascular;  Laterality: N/A;    Family History  Problem Relation Age of Onset  . CAD Mother   . Diabetes Mellitus II Mother   . Hypertension Mother   . Uterine cancer Mother   . CAD Father   . Heart attack Father   . Hypertension Father   . Diabetes Mellitus II Father        Took insulin  . Alcohol abuse Father   . Diabetes Mellitus II Sister   . Diabetes Other        Almost all sisters are diabetic or pre-diabetic   . Breast cancer Neg Hx     Allergies  Allergen Reactions  . Exenatide Nausea Only  . Orlistat     Other  reaction(s): Unknown  . Penicillins Hives and Swelling  . Thiazide-Type Diuretics     Other reaction(s): Unknown    CBC Latest Ref Rng & Units 04/20/2020 09/28/2019 06/29/2019  WBC 4.0 - 10.5 K/uL 6.1 6.2 5.4  Hemoglobin 12.0 - 15.0 g/dL 14.3 14.1 14.1  Hematocrit 36.0 - 46.0 % 42.1 41.8 43.0  Platelets 150 - 400 K/uL 219 244 246      CMP     Component Value Date/Time   NA 138  04/20/2020 1034   K 5.0 04/20/2020 1034   CL 101 04/20/2020 1034   CO2 24 04/20/2020 1034   GLUCOSE 162 (H) 04/20/2020 1034   BUN 17 04/20/2020 1034   CREATININE 0.84 04/20/2020 1034   CALCIUM 9.4 04/20/2020 1034   PROT 7.3 04/20/2020 1034   ALBUMIN 4.2 04/20/2020 1034   AST 25 04/20/2020 1034   ALT 22 04/20/2020 1034   ALKPHOS 55 04/20/2020 1034   BILITOT 0.7 04/20/2020 1034   GFRNONAA >60 04/20/2020 1034   GFRAA >60 09/28/2019 1344     No results found.     Assessment & Plan:   1. 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  The patient is understandably frustrated due to the findings during the angiogram.  It was discussed that certain CT scans and ultrasounds offered 1-dimensional view, whereas the angiogram offers a multidimensional view in addition to real-time visualization of blood flow.  Based on this, vascular causes can be ruled out as cause of the patient's abdominal pain.  Patient is advised to follow-up with PCP for further work-up of possible causes. 2. Hyperlipidemia, unspecified hyperlipidemia type Continue statin as ordered and reviewed, no changes at this time    Current Outpatient Medications on File Prior to Visit  Medication Sig Dispense Refill  . anastrozole (ARIMIDEX) 1 MG tablet Take 1 tablet (1 mg total) by mouth daily. 90 tablet 1  . CALCIUM-MAGNESIUM-ZINC PO Take 1 tablet by mouth every evening.     . clopidogrel  (PLAVIX) 75 MG tablet Take 75 mg by mouth daily.     Marland Kitchen glimepiride (AMARYL) 4 MG tablet Take 4 mg by mouth 2 (two) times daily.     Marland Kitchen glucose blood (CONTOUR NEXT TEST) test strip Use once daily e11.65    . JANUMET 50-500 MG tablet Take 1 tablet by mouth 2 (two) times daily.    Marland Kitchen JARDIANCE 25 MG TABS tablet Take 25 mg by mouth daily.     . lansoprazole (PREVACID) 15 MG capsule Take 15 mg by mouth daily.     Marland Kitchen lovastatin (MEVACOR) 40 MG tablet Take 40 mg by mouth at bedtime.     . metFORMIN (GLUCOPHAGE) 500 MG tablet Take 500 mg by mouth 2 (two) times daily.     . Multiple Vitamin (MULTIVITAMIN) tablet Take 1 tablet by mouth daily.    . Vitamin D, Cholecalciferol, 25 MCG (1000 UT) TABS Take by mouth.     No current facility-administered medications on file prior to visit.    There are no Patient Instructions on file for this visit. No follow-ups on file.   Kris Hartmann, NP

## 2020-09-12 ENCOUNTER — Encounter: Payer: Self-pay | Admitting: Plastic Surgery

## 2020-09-12 ENCOUNTER — Ambulatory Visit (INDEPENDENT_AMBULATORY_CARE_PROVIDER_SITE_OTHER): Payer: Medicare Other | Admitting: Plastic Surgery

## 2020-09-12 ENCOUNTER — Other Ambulatory Visit: Payer: Self-pay

## 2020-09-12 DIAGNOSIS — Z9012 Acquired absence of left breast and nipple: Secondary | ICD-10-CM | POA: Diagnosis not present

## 2020-09-12 DIAGNOSIS — Z170 Estrogen receptor positive status [ER+]: Secondary | ICD-10-CM

## 2020-09-12 DIAGNOSIS — C50412 Malignant neoplasm of upper-outer quadrant of left female breast: Secondary | ICD-10-CM | POA: Diagnosis not present

## 2020-09-12 NOTE — Progress Notes (Signed)
   Subjective:    Patient ID: Tammy Elliott, female    DOB: 04/08/1950, 70 y.o.   MRN: [id number]  The patient is a 70 year old female here for follow-up on her breast reconstruction.  She had left-sided breast cancer.  She underwent a mastectomy with reconstruction.  She had an implant placed in April 2021.  She has a Product manager high profile 350 cc gel implant.  Then in November she had fat filling of the left breast.  She says it was very painful on her abdomen and she does not really want to do that anytime soon.  Overall she is happy she has a couple concerns today.  She has a knee 2 mm cyst like area deep in her skin around the 12 o'clock position of the left breast it is movable and feels like a cyst.  She does not like the indentation she has on the medial part of the left breast.  She also has a little tender spot on her arm.  Her arm feels like a little fat globule.    Review of Systems  Constitutional: Negative.   HENT: Negative.    Eyes: Negative.   Respiratory: Negative.  Negative for chest tightness and shortness of breath.   Cardiovascular: Negative.   Gastrointestinal: Negative.   Endocrine: Negative.   Genitourinary: Negative.   Neurological: Negative.   Hematological: Negative.   Psychiatric/Behavioral: Negative.        Objective:   Physical Exam Nursing note reviewed.  Constitutional:      Appearance: Normal appearance.  HENT:     Head: Normocephalic and atraumatic.  Cardiovascular:     Rate and Rhythm: Normal rate.     Pulses: Normal pulses.  Pulmonary:     Effort: Pulmonary effort is normal.  Musculoskeletal:        General: Tenderness present. No swelling or deformity. Normal range of motion.  Skin:    General: Skin is warm.     Capillary Refill: Capillary refill takes less than 2 seconds.     Coloration: Skin is not jaundiced.     Findings: Lesion present. No bruising.  Neurological:     Mental Status: She is alert. Mental status is at baseline.         Assessment & Plan:     ICD-10-CM   1. S/P mastectomy, left  Z90.12     2. Malignant neoplasm of upper-outer quadrant of left breast in female, estrogen receptor positive ([place])  C50.412    Z17.0      I offered the patient more fat grafting.  She would like to wait until next year.  At that time we could also remove the cyst if it still there.  If it gets any bigger or if the area on her arm changes she knows to let me know and we can do an ultrasound or a fine-needle biopsy.  In the meantime continue massage.  She still needs to keep up with her mammogram on the right breast.  And that should be done yearly.  I will plan to see her back in early 2023. Pictures were obtained of the patient and placed in the chart with the patient's or guardian's permission.

## 2020-10-13 ENCOUNTER — Other Ambulatory Visit: Payer: Self-pay

## 2020-10-13 DIAGNOSIS — Z170 Estrogen receptor positive status [ER+]: Secondary | ICD-10-CM

## 2020-10-13 DIAGNOSIS — C50812 Malignant neoplasm of overlapping sites of left female breast: Secondary | ICD-10-CM

## 2020-10-18 ENCOUNTER — Inpatient Hospital Stay (HOSPITAL_BASED_OUTPATIENT_CLINIC_OR_DEPARTMENT_OTHER): Payer: Medicare Other | Admitting: Oncology

## 2020-10-18 ENCOUNTER — Inpatient Hospital Stay: Payer: Medicare Other | Attending: Oncology

## 2020-10-18 ENCOUNTER — Encounter: Payer: Self-pay | Admitting: Oncology

## 2020-10-18 ENCOUNTER — Other Ambulatory Visit: Payer: Self-pay

## 2020-10-18 VITALS — BP 130/71 | HR 80 | Temp 99.3°F | Wt 180.3 lb

## 2020-10-18 DIAGNOSIS — Z79811 Long term (current) use of aromatase inhibitors: Secondary | ICD-10-CM | POA: Insufficient documentation

## 2020-10-18 DIAGNOSIS — Z170 Estrogen receptor positive status [ER+]: Secondary | ICD-10-CM

## 2020-10-18 DIAGNOSIS — C50812 Malignant neoplasm of overlapping sites of left female breast: Secondary | ICD-10-CM | POA: Insufficient documentation

## 2020-10-18 DIAGNOSIS — R222 Localized swelling, mass and lump, trunk: Secondary | ICD-10-CM | POA: Diagnosis not present

## 2020-10-18 LAB — CBC WITH DIFFERENTIAL/PLATELET
Abs Immature Granulocytes: 0.02 10*3/uL (ref 0.00–0.07)
Basophils Absolute: 0.1 10*3/uL (ref 0.0–0.1)
Basophils Relative: 1 %
Eosinophils Absolute: 0.1 10*3/uL (ref 0.0–0.5)
Eosinophils Relative: 2 %
HCT: 40.6 % (ref 36.0–46.0)
Hemoglobin: 13.6 g/dL (ref 12.0–15.0)
Immature Granulocytes: 0 %
Lymphocytes Relative: 37 %
Lymphs Abs: 1.9 10*3/uL (ref 0.7–4.0)
MCH: 31.6 pg (ref 26.0–34.0)
MCHC: 33.5 g/dL (ref 30.0–36.0)
MCV: 94.4 fL (ref 80.0–100.0)
Monocytes Absolute: 0.5 10*3/uL (ref 0.1–1.0)
Monocytes Relative: 9 %
Neutro Abs: 2.6 10*3/uL (ref 1.7–7.7)
Neutrophils Relative %: 51 %
Platelets: 216 10*3/uL (ref 150–400)
RBC: 4.3 MIL/uL (ref 3.87–5.11)
RDW: 12.5 % (ref 11.5–15.5)
WBC: 5.1 10*3/uL (ref 4.0–10.5)
nRBC: 0 % (ref 0.0–0.2)

## 2020-10-18 LAB — COMPREHENSIVE METABOLIC PANEL
ALT: 19 U/L (ref 0–44)
AST: 24 U/L (ref 15–41)
Albumin: 4.2 g/dL (ref 3.5–5.0)
Alkaline Phosphatase: 55 U/L (ref 38–126)
Anion gap: 10 (ref 5–15)
BUN: 29 mg/dL — ABNORMAL HIGH (ref 8–23)
CO2: 24 mmol/L (ref 22–32)
Calcium: 9.1 mg/dL (ref 8.9–10.3)
Chloride: 102 mmol/L (ref 98–111)
Creatinine, Ser: 0.71 mg/dL (ref 0.44–1.00)
GFR, Estimated: >60 mL/min (ref >60)
Glucose, Bld: 93 mg/dL (ref 70–99)
Potassium: 4 mmol/L (ref 3.5–5.1)
Sodium: 136 mmol/L (ref 135–145)
Total Bilirubin: 0.8 mg/dL (ref 0.3–1.2)
Total Protein: 7.4 g/dL (ref 6.5–8.1)

## 2020-10-18 MED ORDER — ANASTROZOLE 1 MG PO TABS: 1.0000 mg | ORAL_TABLET | Freq: Every day | ORAL | 1 refills | Status: DC

## 2020-10-18 NOTE — Progress Notes (Signed)
Hematology/Oncology follow up note [facility] Telephone:([phone] Fax:([phone]) [phone]   Patient Care Team: Baxter Hire, MD as PCP - General (Internal Medicine) Leonie Man, MD as PCP - Cardiology (Cardiology) Earlie Server, MD as Consulting Physician (Oncology) Herbert Pun, MD as Consulting Physician (General Surgery) Dillingham, Loel Lofty, DO as Attending Physician (Plastic Surgery) Rico Junker, RN as Registered Nurse Theodore Demark, RN as Registered Nurse  REFERRING PROVIDER: Baxter Hire, MD  CHIEF COMPLAINTS/REASON FOR VISIT:  Follow up of breast cancer  HISTORY OF PRESENTING ILLNESS:   Tammy Elliott is a  70 y.o.  female with PMH listed below was seen in consultation at the request of  Baxter Hire, MD  for evaluation of breast cancer Patient had screening mammogram done on 02/16/2019 which showed left breast asymmetry with distortion in the upper outer quadrant. 03/03/2019 left diagnostic mammogram showed small asymmetry noted on the screening study appears to be a small ill-defined mass with associated subtle distortion.  1:30 position of the left breast.  7 cm from the nipple. Sonographic evaluation of the left axillary showed no enlarged or abnormal lymph nodes. Patient underwent stereotactic core needle biopsy of the mass. 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  Grade 2, ER > 90%, PR 1 to 10%, HER-2 negative. Patient denies any family history of breast cancer. Menarche 70 years old, menopause 70 years old Remote use of OCP for about 10 to 15 years, Never used hormone replacement therapy. Denies any history of radiation to the chest.  03/13/2019 Breast MRI bilateral showed numerous suspicious enhancing masses through out the upper inner and upper outer left breast concerning for multicentric diseases. Suspicious findings span greater  than 6cm in both AP and transverse dimensions.  MRI biopsy of left breast central middle and central anterior mass showed invasive mammary carcinoma, grade 1/2.  # 05/03/2019 patient underwent left total mastectomy, and SLNB.  Pathology showed invasive lobular carcinoma, multifocal, LCIS, 1 lymph node negative for malignancy.  Stage IA, pT2(m) pN0 (sn), Grade 2, Margins are not involved by invasive carcinoma.  ER 95%, PR 30%, HER2 IHC 2+ equivocal, FISH negative.   OncotypeDX recurrence score is 19, patient is postmenopausal, chemotherapy benefit is <1%, I will not offer adjuvant chemotherapy.  Patient also has an resection of skin lesion close to her left breast.-SCC  April 2021 started on Armidex 27m daily.  Patient underwent left breast reconstruction  INTERVAL HISTORY Tammy WHITELAWis a 70y.o. female who has above history reviewed by me today presents for follow up visit for management of breast cancer. Problems and complaints are listed below: Patient takes Arimidex daily.  She palpated a very small left chest wall nodule.  Also complains left arm soreness.  No swelling. She is anxious about screening mammogram not able to pick up early signals of cancer if any for her right breast.  She prefers her annual mammogram of unilateral right side to be diagnostic.    Review of Systems  Constitutional:  Negative for appetite change, chills, fatigue and fever.  HENT:   Negative for hearing loss and voice change.   Eyes:  Negative for eye problems.  Respiratory:  Negative for chest tightness and cough.   Cardiovascular:  Negative for chest pain.  Gastrointestinal:  Negative for abdominal distention, abdominal pain and blood in stool.  Endocrine: Negative for hot flashes.  Genitourinary:  Negative for difficulty  urinating and frequency.   Musculoskeletal:  Negative for arthralgias.  Skin:  Negative for itching and rash.  Neurological:  Negative for extremity weakness.  Hematological:   Negative for adenopathy.  Psychiatric/Behavioral:  Negative for confusion.   Right chest wall nodule MEDICAL HISTORY:  Past Medical History:  Diagnosis Date   Basal cell carcinoma    basal cell -skin cancer s/p removal   Breast cancer ([place]) 02/2019   left   CAD S/P percutaneous coronary angioplasty 2012   Dr. Saralyn Pilar ([place], [place]): DES PCI to LAD   DM (diabetes mellitus), type 2 with complications ([place])    CAD   GERD (gastroesophageal reflux disease)    Hypercholesterolemia    Hyperlipidemia associated with type 2 diabetes mellitus ([place])    On lovastatin    SURGICAL HISTORY: Past Surgical History:  Procedure Laterality Date   BREAST BIOPSY Left 03/11/2019   stereo biopsy/ x clip/path pending   BREAST RECONSTRUCTION WITH PLACEMENT OF TISSUE EXPANDER AND FLEX HD (ACELLULAR HYDRATED DERMIS) Left 05/03/2019   Procedure: LEFT BREAST RECONSTRUCTION WITH PLACEMENT OF TISSUE EXPANDER AND FLEX HD (ACELLULAR HYDRATED DERMIS);  Surgeon: Wallace Going, DO;  Location: [facility];  Service: Plastics;  Laterality: Left;   CARDIAC CATHETERIZATION     CHOLECYSTECTOMY     COLONOSCOPY     CORONARY STENT INTERVENTION  2012   Dr. Saralyn Pilar ([place]): DES PCI pLAD   LIPOSUCTION WITH LIPOFILLING Bilateral 01/27/2020   Procedure: LIPOSUCTION WITH LIPOFILLING;  Surgeon: Wallace Going, DO;  Location: [place];  Service: Plastics;  Laterality: Bilateral;  90 min   MASTECTOMY Left 02/2019   MASTOPEXY Right 07/01/2019   Procedure: MASTOPEXY;  Surgeon: Wallace Going, DO;  Location: [place];  Service: Plastics;  Laterality: Right;   NM GATED MYOVIEW (ARMX HX)  05/09/2017   [facility]) exercised 8:36 min with no chest pain or dyspnea.  No EKG changes.  Normal EF 66%.  No ischemia or infarction)   REMOVAL OF TISSUE EXPANDER AND PLACEMENT OF IMPLANT Left 07/01/2019   Procedure: REMOVAL OF TISSUE  EXPANDER AND PLACEMENT OF IMPLANT;  Surgeon: Wallace Going, DO;  Location: [place];  Service: Plastics;  Laterality: Left;  total case 2.5 hours   TOTAL MASTECTOMY Left 05/03/2019   Procedure: TOTAL MASTECTOMY W/ Sentinel Node;  Surgeon: Herbert Pun, MD;  Location: [facility];  Service: General;  Laterality: Left;   VISCERAL ANGIOGRAPHY N/A 05/01/2020   Procedure: VISCERAL ANGIOGRAPHY;  Surgeon: Algernon Huxley, MD;  Location: [place] [facility];  Service: Cardiovascular;  Laterality: N/A;    SOCIAL HISTORY: Social History   Socioeconomic History   Marital status: Married    Spouse name: Not on file   Number of children: Not on file   Years of education: Not on file   Highest education level: Not on file  Occupational History   Occupation: Analyst    Employer: [employer]  Tobacco Use   Smoking status: Never   Smokeless tobacco: Never  Vaping Use   Vaping Use: Never used  Substance and Sexual Activity   Alcohol use: Not Currently   Drug use: Never   Sexual activity: Not on file  Other Topics Concern   Not on file  Social History Narrative   reports that she has never smoked. She has never used smokeless tobacco. She reports that she rarely drinks alcohol. She reports that she does not use drugs.  Married to [name] ([place]) [name]    Social Determinants of Health   Financial Resource Strain: Not on file  Food Insecurity: Not on file  Transportation Needs: Not on file  Physical Activity: Not on file  Stress: Not on file  Social Connections: Not on file  Intimate Partner Violence: Not on file    FAMILY HISTORY: Family History  Problem Relation Age of Onset   CAD Mother    Diabetes Mellitus II Mother    Hypertension Mother    Uterine cancer Mother    CAD Father    Heart attack Father    Hypertension Father    Diabetes Mellitus II Father        Took insulin   Alcohol abuse Father    Diabetes Mellitus II Sister    Diabetes Other         Almost all sisters are diabetic or pre-diabetic    Breast cancer Neg Hx     ALLERGIES:  is allergic to exenatide, orlistat, penicillins, and thiazide-type diuretics.  MEDICATIONS:  Current Outpatient Medications  Medication Sig Dispense Refill   CALCIUM-MAGNESIUM-ZINC PO Take 1 tablet by mouth every evening.      clopidogrel (PLAVIX) 75 MG tablet Take 75 mg by mouth daily.      glimepiride (AMARYL) 4 MG tablet Take 4 mg by mouth 2 (two) times daily.      glucose blood (CONTOUR NEXT TEST) test strip Use once daily e11.65     JANUMET 50-500 MG tablet Take 1 tablet by mouth 2 (two) times daily.     JARDIANCE 25 MG TABS tablet Take 25 mg by mouth daily.      lansoprazole (PREVACID) 15 MG capsule Take 15 mg by mouth daily.      lovastatin (MEVACOR) 40 MG tablet Take 40 mg by mouth at bedtime.      metFORMIN (GLUCOPHAGE) 500 MG tablet Take 500 mg by mouth 2 (two) times daily.      Multiple Vitamin (MULTIVITAMIN) tablet Take 1 tablet by mouth daily.     Vitamin D, Cholecalciferol, 25 MCG (1000 UT) TABS Take by mouth.     anastrozole (ARIMIDEX) 1 MG tablet Take 1 tablet (1 mg total) by mouth daily. 90 tablet 1   No current facility-administered medications for this visit.     PHYSICAL EXAMINATION: ECOG PERFORMANCE STATUS: 0 - Asymptomatic Vitals:   10/18/20 1357  BP: 130/71  Pulse: 80  Temp: 99.3 F (37.4 C)  SpO2: 100%   Filed Weights   10/18/20 1357  Weight: 180 lb 4.8 oz (81.8 kg)    Physical Exam Constitutional:      General: She is not in acute distress. HENT:     Head: Normocephalic and atraumatic.  Eyes:     General: No scleral icterus.    Pupils: Pupils are equal, round, and reactive to light.  Cardiovascular:     Rate and Rhythm: Normal rate and regular rhythm.     Heart sounds: Normal heart sounds.  Pulmonary:     Effort: Pulmonary effort is normal. No respiratory distress.     Breath sounds: No wheezing.  Abdominal:     General: Bowel sounds are normal.  There is no distension.     Palpations: Abdomen is soft. There is no mass.     Tenderness: There is no abdominal tenderness.  Musculoskeletal:        General: No deformity. Normal range of motion.     Cervical back: Normal range of motion  and neck supple.  Skin:    General: Skin is warm and dry.     Findings: No erythema or rash.  Neurological:     Mental Status: She is alert and oriented to person, place, and time. Mental status is at baseline.     Cranial Nerves: No cranial nerve deficit.     Coordination: Coordination normal.  Psychiatric:     Comments: Anxious   Breast exam was performed in seated and lying down position. Patient is status post lumpectomy /reconstruction. There is a very small but palpable nodule, mobile, 0.5 cm, 12:00,  above her upper age of the left breast implant No palpable mass in right breast.   LABORATORY DATA:  I have reviewed the data as listed Lab Results  Component Value Date   WBC 5.1 10/18/2020   HGB 13.6 10/18/2020   HCT 40.6 10/18/2020   MCV 94.4 10/18/2020   PLT 216 10/18/2020   Recent Labs    01/24/20 1242 03/02/20 0905 04/20/20 1034 10/18/20 1301  NA 138  --  138 136  K 4.7  --  5.0 4.0  CL 102  --  101 102  CO2 24  --  24 24  GLUCOSE 135*  --  162* 93  BUN 15  --  17 29*  CREATININE 0.85 0.80 0.84 0.71  CALCIUM 9.6  --  9.4 9.1  GFRNONAA >60  --  >60 >60  PROT  --   --  7.3 7.4  ALBUMIN  --   --  4.2 4.2  AST  --   --  25 24  ALT  --   --  22 19  ALKPHOS  --   --  55 55  BILITOT  --   --  0.7 0.8    Iron/TIBC/Ferritin/ %Sat No results found for: IRON, TIBC, FERRITIN, IRONPCTSAT    RADIOGRAPHIC STUDIES: I have personally reviewed the radiological images as listed and agreed with the findings in the report. No results found.      ASSESSMENT & PLAN:  1. Malignant neoplasm of overlapping sites of left breast in female, estrogen receptor positive ([place])   Cancer Staging Malignant neoplasm of upper-outer quadrant  of left breast in female, estrogen receptor positive ([place]) Staging form: Breast, AJCC 8th Edition - Clinical: Stage IA (cT1b, cN0, cM0, G2, ER+, PR+, HER2-) - Signed by Earlie Server, MD on 03/23/2019 - Pathologic: Stage IA (pT2, pN0, cM0, G2, ER+, PR+, HER2-, Oncotype DX score: 19) - Signed by Earlie Server, MD on 05/29/2019  1. Malignant neoplasm of overlapping sites of left breast in female, estrogen receptor positive ([place])   2. Aromatase inhibitor use   3. Nodule of anterior chest wall    # Left Stage IA breast cancer, invasive lobular carcinoma, multicentric disease  She tolerates adjuvant endocrine therapy with Arimidex 31m daily.  Labs reviewed and discussed with patient. Continue Arimidex. For to lymphedema clinic.  Bone density was normal on DEXA.  Recommend exercise, calcium and vitamin D supplementation She is due for unilateral right breast mammogram.  She prefers diagnostic mammogram.  No right breast concerns currently but she is concerned about screening mammogram not good enough to pick up small cancers. Referred to establish care with genetic counseling.  #Left chest wall small nodule.  Refer patient to see Dr. CPeyton Najjarfor evaluation/biopsy.  Follow-up in 6 months.   Orders Placed This Encounter  Procedures   MM DIAG BREAST TOMO UNI RIGHT    Standing Status:  Future    Standing Expiration Date:   10/18/2021    Order Specific Question:   Reason for Exam (SYMPTOM  OR DIAGNOSIS REQUIRED)    Answer:   hx of breast cancer    Order Specific Question:   Preferred imaging location?    Answer:   [facility]   US Breast Limited Uni Right Inc Axilla    Standing Status:   Future    Standing Expiration Date:   10/18/2021    Order Specific Question:   Reason for Exam (SYMPTOM  OR DIAGNOSIS REQUIRED)    Answer:   hx of breast cancer    Order Specific Question:   Preferred imaging location?    Answer:   [facility]   CBC with Differential    Standing Status:   Future     Standing Expiration Date:   10/18/2021   Comprehensive metabolic panel    Standing Status:   Future    Standing Expiration Date:   10/18/2021   Ambulatory referral to Genetics    Referral Priority:   Routine    Referral Type:   Consultation    Referral Reason:   Specialty Services Required    Number of Visits Requested:   1   Ambulatory referral to General Surgery    Referral Priority:   Routine    Referral Type:   Surgical    Referral Reason:   Specialty Services Required    Referred to Provider:   Herbert Pun, MD    Requested Specialty:   General Surgery    Number of Visits Requested:   1    All questions were answered. The patient knows to call the clinic with any problems questions or concerns. Follow-up in 6 months  Earlie Server, MD, PhD Hematology Oncology [facility] Pager- [phone] 10/18/2020

## 2020-10-20 ENCOUNTER — Encounter: Payer: Self-pay | Admitting: Oncology

## 2020-10-25 ENCOUNTER — Encounter: Payer: Self-pay | Admitting: Oncology

## 2020-10-25 ENCOUNTER — Ambulatory Visit: Payer: Self-pay | Admitting: General Surgery

## 2020-10-25 NOTE — H&P (View-Only) (Signed)
HISTORY OF PRESENT ILLNESS:    Tammy Elliott is a 70 y.o.female patient who comes for evaluation of a left breast palpable mass.    Patient has history of left breast cancer s/p mastectomy with reconstruction. Patient has been feeling a palpable lump on the left breast since few month ago. Mass may cause pain aggravated with palpation. No alleviating factor identified. No pain radiation. There has been no skin changes. There is no axillary adenopathy.       PAST MEDICAL HISTORY:      Past Medical History:  Diagnosis Date   Aortic atherosclerosis (CMS-HCC)      on CT scan 02/2020   Basal cell carcinoma      S/P Removal   Breast cancer (CMS-HCC) 01/2019    Left breast, s/p mastectomy and reconstruction   Chronic ischemic heart disease, unspecified     Other and unspecified hyperlipidemia     Type 2 diabetes mellitus (CMS-HCC)          PAST SURGICAL HISTORY:        Past Surgical History:  Procedure Laterality Date   BREAST SURGERY        mastecomy, reconstruction   CHOLECYSTECTOMY       MASTECTOMY Left 05/03/2019    Total Mastectomy W/ Sentinel Node         MEDICATIONS:  Encounter Medications        Outpatient Encounter Medications as of 10/24/2020  Medication Sig Dispense Refill   anastrozole (ARIMIDEX) 1 mg tablet Take 1 tablet by mouth once daily       blood glucose diagnostic (CONTOUR NEXT TEST STRIPS) test strip Use once daily e11.65 100 strip 1   blood sugar diagnostic, drum (BLOOD GLUCOSE DIAGNOSTIC, DRUM) test strip Use once daily Use as instructed. 100 each 1   calcium-magnesium-zinc 333-133-5 mg Tab Take 1 tablet by mouth once daily.       clopidogreL (PLAVIX) 75 mg tablet Take 1 tablet (75 mg total) by mouth once daily 90 tablet 3   empagliflozin (JARDIANCE) 25 mg tablet Take 1 tablet (25 mg total) by mouth daily with breakfast 90 tablet 1   glimepiride (AMARYL) 4 MG tablet Take 1 tablet (4 mg total) by mouth 2 (two) times daily 180 tablet 1   lansoprazole  (PREVACID) 15 MG DR capsule Take 1 capsule (15 mg total) by mouth once daily 90 capsule 1   lovastatin (MEVACOR) 40 MG tablet Take 1 tablet (40 mg total) by mouth once daily 90 tablet 3   metFORMIN (GLUCOPHAGE) 500 MG tablet Take 1 tablet (500 mg total) by mouth 2 (two) times daily with meals 180 tablet 1   multivitamin tablet Take 1 tablet by mouth once daily.       pioglitazone (ACTOS) 30 MG tablet Take 1 tablet (30 mg total) by mouth once daily 90 tablet 1   sitaGLIPtin-metFORMIN (JANUMET) 50-500 mg tablet Take 1 tablet by mouth 2 (two) times daily with meals 180 tablet 1   lancing device with lancets kit Use 1 each once daily Use as instructed. 1 each 0    No facility-administered encounter medications on file as of 10/24/2020.        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 Also competes in shooting competitions.        FAMILY HISTORY:       Family History  Problem Relation Age of Onset   Coronary Artery Disease (Blocked arteries around heart) Mother     Uterine cancer Mother     Diabetes Mother     High blood pressure (Hypertension) Mother     Myocardial Infarction (Heart attack) Father     Alcohol abuse Father     Diabetes Father          insulin   High blood pressure (Hypertension) Father     Colon polyps Sister          Diagnosed in her 76s   Diabetes Sister     Diabetes Other           Almost all sisters are diabetic or pre-diabetic      GENERAL REVIEW OF SYSTEMS:    General ROS: negative for - chills, fatigue, fever, weight gain or weight loss Allergy and Immunology ROS: negative for - hives  Hematological and Lymphatic ROS: negative for - bleeding problems or bruising, negative for palpable nodes Endocrine ROS: negative for - heat or cold intolerance, hair changes Respiratory ROS: negative for - cough, shortness of breath or wheezing Cardiovascular ROS: no chest pain or palpitations GI ROS: negative for nausea, vomiting, abdominal pain, diarrhea, constipation Musculoskeletal ROS: negative for - joint swelling or muscle pain Neurological ROS: negative for - confusion, syncope Dermatological ROS: negative for pruritus and rash   PHYSICAL EXAM:     Vitals:    10/24/20 0750  BP: 127/79  Pulse: 83  .  Ht:172.7 cm ('5\' 8"' ) Wt:81.6 kg (180 lb) KPT:WSFK surface area is 1.98 meters squared. Body mass index is 27.37 kg/m.Marland Kitchen   GENERAL: Alert, active, oriented x3   HEENT: Pupils equal reactive to light. Extraocular movements are intact. Sclera clear. Palpebral conjunctiva normal red color.Pharynx clear.   NECK: Supple with no palpable mass and no adenopathy.   LUNGS: Sound clear with no rales rhonchi or wheezes.   HEART: Regular rhythm S1 and S2 without murmur.   BREAST: There is a left chest palpable induration in the superior inner aspect the the breast. No skin changes. No axillary adenopathy.    ABDOMEN: Soft and depressible, nontender with no palpable mass, no hepatomegaly.   EXTREMITIES: Well-developed well-nourished symmetrical with no dependent edema.   NEUROLOGICAL: Awake alert oriented, facial expression symmetrical, moving all extremities.      IMPRESSION:     Mass of upper inner quadrant of left breast [N63.22]         Patient with history of breast cancer. New palpable mass on the superior inner portion. Too superficial for diagnostic  ultrasound. Will need excisional biopsy to rule out malignancy. Patient oriented about recommendations and about surgical management and risk. Patient reports understood and agreed.    PLAN:  Left breast excisional biopsy (81275) Will need to hold Plavix for 7 days Cardiology clearance Contact us if you have any concern.    Patient  verbalized understanding, all questions were answered, and were agreeable with the plan outlined above.    Herbert Pun, MD

## 2020-10-25 NOTE — H&P (Signed)
HISTORY OF PRESENT ILLNESS:    Tammy Elliott is a 70 y.o.female patient who comes for evaluation of a left breast palpable mass.    Patient has history of left breast cancer s/p mastectomy with reconstruction. Patient has been feeling a palpable lump on the left breast since few month ago. Mass may cause pain aggravated with palpation. No alleviating factor identified. No pain radiation. There has been no skin changes. There is no axillary adenopathy.       PAST MEDICAL HISTORY:      Past Medical History:  Diagnosis Date   Aortic atherosclerosis (CMS-HCC)      on CT scan 02/2020   Basal cell carcinoma      S/P Removal   Breast cancer (CMS-HCC) 01/2019    Left breast, s/p mastectomy and reconstruction   Chronic ischemic heart disease, unspecified     Other and unspecified hyperlipidemia     Type 2 diabetes mellitus (CMS-HCC)          PAST SURGICAL HISTORY:        Past Surgical History:  Procedure Laterality Date   BREAST SURGERY        mastecomy, reconstruction   CHOLECYSTECTOMY       MASTECTOMY Left 05/03/2019    Total Mastectomy W/ Sentinel Node         MEDICATIONS:  Encounter Medications        Outpatient Encounter Medications as of 10/24/2020  Medication Sig Dispense Refill   anastrozole (ARIMIDEX) 1 mg tablet Take 1 tablet by mouth once daily       blood glucose diagnostic (CONTOUR NEXT TEST STRIPS) test strip Use once daily e11.65 100 strip 1   blood sugar diagnostic, drum (BLOOD GLUCOSE DIAGNOSTIC, DRUM) test strip Use once daily Use as instructed. 100 each 1   calcium-magnesium-zinc 333-133-5 mg Tab Take 1 tablet by mouth once daily.       clopidogreL (PLAVIX) 75 mg tablet Take 1 tablet (75 mg total) by mouth once daily 90 tablet 3   empagliflozin (JARDIANCE) 25 mg tablet Take 1 tablet (25 mg total) by mouth daily with breakfast 90 tablet 1   glimepiride (AMARYL) 4 MG tablet Take 1 tablet (4 mg total) by mouth 2 (two) times daily 180 tablet 1   lansoprazole  (PREVACID) 15 MG DR capsule Take 1 capsule (15 mg total) by mouth once daily 90 capsule 1   lovastatin (MEVACOR) 40 MG tablet Take 1 tablet (40 mg total) by mouth once daily 90 tablet 3   metFORMIN (GLUCOPHAGE) 500 MG tablet Take 1 tablet (500 mg total) by mouth 2 (two) times daily with meals 180 tablet 1   multivitamin tablet Take 1 tablet by mouth once daily.       pioglitazone (ACTOS) 30 MG tablet Take 1 tablet (30 mg total) by mouth once daily 90 tablet 1   sitaGLIPtin-metFORMIN (JANUMET) 50-500 mg tablet Take 1 tablet by mouth 2 (two) times daily with meals 180 tablet 1   lancing device with lancets kit Use 1 each once daily Use as instructed. 1 each 0    No facility-administered encounter medications on file as of 10/24/2020.        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 Also competes in shooting competitions.        FAMILY HISTORY:       Family History  Problem Relation Age of Onset   Coronary Artery Disease (Blocked arteries around heart) Mother     Uterine cancer Mother     Diabetes Mother     High blood pressure (Hypertension) Mother     Myocardial Infarction (Heart attack) Father     Alcohol abuse Father     Diabetes Father          insulin   High blood pressure (Hypertension) Father     Colon polyps Sister          Diagnosed in her 60s   Diabetes Sister     Diabetes Other           Almost all sisters are diabetic or pre-diabetic      GENERAL REVIEW OF SYSTEMS:    General ROS: negative for - chills, fatigue, fever, weight gain or weight loss Allergy and Immunology ROS: negative for - hives  Hematological and Lymphatic ROS: negative for - bleeding problems or bruising, negative for palpable nodes Endocrine ROS: negative for - heat or cold intolerance, hair changes Respiratory ROS: negative for - cough, shortness of breath or wheezing Cardiovascular ROS: no chest pain or palpitations GI ROS: negative for nausea, vomiting, abdominal pain, diarrhea, constipation Musculoskeletal ROS: negative for - joint swelling or muscle pain Neurological ROS: negative for - confusion, syncope Dermatological ROS: negative for pruritus and rash   PHYSICAL EXAM:     Vitals:    10/24/20 0750  BP: 127/79  Pulse: 83  .  Ht:172.7 cm ('5\' 8"' ) Wt:81.6 kg (180 lb) PVX:YIAX surface area is 1.98 meters squared. Body mass index is 27.37 kg/m.Marland Kitchen   GENERAL: Alert, active, oriented x3   HEENT: Pupils equal reactive to light. Extraocular movements are intact. Sclera clear. Palpebral conjunctiva normal red color.Pharynx clear.   NECK: Supple with no palpable mass and no adenopathy.   LUNGS: Sound clear with no rales rhonchi or wheezes.   HEART: Regular rhythm S1 and S2 without murmur.   BREAST: There is a left chest palpable induration in the superior inner aspect the the breast. No skin changes. No axillary adenopathy.    ABDOMEN: Soft and depressible, nontender with no palpable mass, no hepatomegaly.   EXTREMITIES: Well-developed well-nourished symmetrical with no dependent edema.   NEUROLOGICAL: Awake alert oriented, facial expression symmetrical, moving all extremities.      IMPRESSION:     Mass of upper inner quadrant of left breast [N63.22]         Patient with history of breast cancer. New palpable mass on the superior inner portion. Too superficial for diagnostic  ultrasound. Will need excisional biopsy to rule out malignancy. Patient oriented about recommendations and about surgical management and risk. Patient reports understood and agreed.    PLAN:  Left breast excisional biopsy ([id number]) Will need to hold Plavix for 7 days Cardiology clearance Contact us if you have any concern.    Patient  verbalized understanding, all questions were answered, and were agreeable with the plan outlined above.    Herbert Pun, MD

## 2020-10-26 ENCOUNTER — Other Ambulatory Visit: Payer: Self-pay

## 2020-10-26 DIAGNOSIS — Z1231 Encounter for screening mammogram for malignant neoplasm of breast: Secondary | ICD-10-CM

## 2020-10-26 DIAGNOSIS — C50412 Malignant neoplasm of upper-outer quadrant of left female breast: Secondary | ICD-10-CM

## 2020-10-26 DIAGNOSIS — Z170 Estrogen receptor positive status [ER+]: Secondary | ICD-10-CM

## 2020-10-26 NOTE — Telephone Encounter (Signed)
10/26/2020 Screening mammo scheduled for 02/27/21 @ 8 am. Tammy Elliott w/ pt previously to discuss scheduling preferences, and she said she will check mychart and reach out if it needs to be r/s later on SRW

## 2020-11-01 ENCOUNTER — Inpatient Hospital Stay: Payer: Medicare Other | Admitting: Occupational Therapy

## 2020-11-01 ENCOUNTER — Encounter: Payer: Self-pay | Admitting: Licensed Clinical Social Worker

## 2020-11-01 ENCOUNTER — Inpatient Hospital Stay (HOSPITAL_BASED_OUTPATIENT_CLINIC_OR_DEPARTMENT_OTHER): Payer: Medicare Other | Admitting: Licensed Clinical Social Worker

## 2020-11-01 ENCOUNTER — Inpatient Hospital Stay: Payer: Medicare Other

## 2020-11-01 DIAGNOSIS — Z8049 Family history of malignant neoplasm of other genital organs: Secondary | ICD-10-CM | POA: Diagnosis not present

## 2020-11-01 DIAGNOSIS — Z170 Estrogen receptor positive status [ER+]: Secondary | ICD-10-CM | POA: Diagnosis not present

## 2020-11-01 DIAGNOSIS — C50412 Malignant neoplasm of upper-outer quadrant of left female breast: Secondary | ICD-10-CM | POA: Diagnosis not present

## 2020-11-01 DIAGNOSIS — M79602 Pain in left arm: Secondary | ICD-10-CM

## 2020-11-01 NOTE — Therapy (Signed)
[facility] [address] [place], [place], [place], [address] Phone: [phone]   Fax:  [phone]  Occupational Therapy Screen:  Patient Details  Name: Tammy Elliott MRN: [id number] Date of Birth: [birth year] No data recorded  Encounter Date: 11/01/2020   OT End of Session - 11/01/20 1913     Visit Number 0             Past Medical History:  Diagnosis Date   Basal cell carcinoma    basal cell -skin cancer s/p removal   Breast cancer ([place]) 02/2019   left   CAD S/P percutaneous coronary angioplasty 2012   Dr. Saralyn Pilar ([place], [place]): DES PCI to LAD   DM (diabetes mellitus), type 2 with complications ([place])    CAD   Family history of uterine cancer    GERD (gastroesophageal reflux disease)    Hypercholesterolemia    Hyperlipidemia associated with type 2 diabetes mellitus ([place])    On lovastatin    Past Surgical History:  Procedure Laterality Date   BREAST BIOPSY Left 03/11/2019   stereo biopsy/ x clip/path pending   BREAST RECONSTRUCTION WITH PLACEMENT OF TISSUE EXPANDER AND FLEX HD (ACELLULAR HYDRATED DERMIS) Left 05/03/2019   Procedure: LEFT BREAST RECONSTRUCTION WITH PLACEMENT OF TISSUE EXPANDER AND FLEX HD (ACELLULAR HYDRATED DERMIS);  Surgeon: Wallace Going, DO;  Location: [facility];  Service: Plastics;  Laterality: Left;   CARDIAC CATHETERIZATION     CHOLECYSTECTOMY     COLONOSCOPY     CORONARY STENT INTERVENTION  2012   Dr. Saralyn Pilar ([place]): DES PCI pLAD   LIPOSUCTION WITH LIPOFILLING Bilateral 01/27/2020   Procedure: LIPOSUCTION WITH LIPOFILLING;  Surgeon: Wallace Going, DO;  Location: [facility];  Service: Plastics;  Laterality: Bilateral;  90 min   MASTECTOMY Left 02/2019   MASTOPEXY Right 07/01/2019   Procedure: MASTOPEXY;  Surgeon: Wallace Going, DO;  Location: [facility];  Service: Plastics;  Laterality:  Right;   NM GATED MYOVIEW (ARMX HX)  05/09/2017   [facility]) exercised 8:36 min with no chest pain or dyspnea.  No EKG changes.  Normal EF 66%.  No ischemia or infarction)   REMOVAL OF TISSUE EXPANDER AND PLACEMENT OF IMPLANT Left 07/01/2019   Procedure: REMOVAL OF TISSUE EXPANDER AND PLACEMENT OF IMPLANT;  Surgeon: Wallace Going, DO;  Location: [facility];  Service: Plastics;  Laterality: Left;  total case 2.5 hours   TOTAL MASTECTOMY Left 05/03/2019   Procedure: TOTAL MASTECTOMY W/ Sentinel Node;  Surgeon: Herbert Pun, MD;  Location: [facility];  Service: General;  Laterality: Left;   VISCERAL ANGIOGRAPHY N/A 05/01/2020   Procedure: VISCERAL ANGIOGRAPHY;  Surgeon: Algernon Huxley, MD;  Location: [facility];  Service: Cardiovascular;  Laterality: N/A;    There were no vitals filed for this visit.   Subjective Assessment - 11/01/20 1909     Subjective  Since I had my surgery last year had some tenderness over my L upper arm - and started working out with trainer the last 4 wks - seen some improvement but still tender over the upper arm with certain movement - wanted you to look at it                 LYMPHEDEMA/ONCOLOGY QUESTIONNAIRE - 11/01/20 0001       Right Upper Extremity Lymphedema   15 cm Proximal to Olecranon Process 31 cm  10 cm Proximal to Olecranon Process 29 cm    Olecranon Process 27 cm    15 cm Proximal to Ulnar Styloid Process 22 cm    Just Proximal to Ulnar Styloid Process 16.5 cm      Left Upper Extremity Lymphedema   15 cm Proximal to Olecranon Process 32.6 cm    10 cm Proximal to Olecranon Process 30 cm    Olecranon Process 26.5 cm    15 cm Proximal to Ulnar Styloid Process 25.5 cm    Just Proximal to Ulnar Styloid Process 16.5 cm              DR YU note 10/18/20:  ASSESSMENT & PLAN:  1. Malignant neoplasm of overlapping sites of left breast in female, estrogen receptor positive ([place])   Cancer  Staging Malignant neoplasm of upper-outer quadrant of left breast in female, estrogen receptor positive ([place]) Staging form: Breast, AJCC 8th Edition - Clinical: Stage IA (cT1b, cN0, cM0, G2, ER+, PR+, HER2-) - Signed by Earlie Server, MD on 03/23/2019 - Pathologic: Stage IA (pT2, pN0, cM0, G2, ER+, PR+, HER2-, Oncotype DX score: 19) - Signed by Earlie Server, MD on 05/29/2019   1. Malignant neoplasm of overlapping sites of left breast in female, estrogen receptor positive ([place])   2. Aromatase inhibitor use   3. Nodule of anterior chest wall     # Left Stage IA breast cancer, invasive lobular carcinoma, multicentric disease  She tolerates adjuvant endocrine therapy with Arimidex 36m daily.  Labs reviewed and discussed with patient. Continue Arimidex. For to lymphedema clinic.   Bone density was normal on DEXA.  Recommend exercise, calcium and vitamin D supplementation She is due for unilateral right breast mammogram.  She prefers diagnostic mammogram.  No right breast concerns currently but she is concerned about screening mammogram not good enough to pick up small cancers. Referred to establish care with genetic counseling.   #Left chest wall small nodule.  Refer patient to see Dr. CPeyton Najjarfor evaluation/biopsy.   OT SCREEN 11/01/20: Pt refer to OT for reports of L upper arm discomfort or tenderness with shoulder flexion , abd, sleeping an over head use. Pt report symptoms since surgery last year- started working out with trainer the last 4 wks and can see difference in ROM and strength. Pt circumference in L upper arm increase some what - but could be because of some discomfort and pain  Appear pt has some impingement in L shoulder - rounder shoulder on the L , tenderness and tightness in L upper traps, and scapula protracted on the L  Pt do sit for computer work most of the day and yard work Sleep on her side with hands infront of chest crossed   HEP : pt to cont wit trainer but avoid ADD of  shoulder , Add pect stretch on rolled up towel Upper traps stretch and massage Ice on L shoulder  And scapula squeezes several times during day  RTB for scapula squeezes - and shoulder extention  12 reps - 2 sets      Cont and if any issues contact or follow up with me                               Visit Diagnosis: Pain in left arm    Problem List Patient Active Problem List   Diagnosis Date Noted   Family history of uterine cancer 11/01/2020   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 [place], [place], [address] Phone: [phone]   Fax:  [phone]  Name: Tammy Elliott MRN: [id number] Date of Birth: [birth year]

## 2020-11-01 NOTE — Progress Notes (Signed)
REFERRING PROVIDER: Earlie Server, MD [place],  [place] [address]  PRIMARY PROVIDER:  Baxter Hire, MD  PRIMARY REASON FOR VISIT:  1. Malignant neoplasm of upper-outer quadrant of left breast in female, estrogen receptor positive ([place])   2. Family history of uterine cancer      HISTORY OF PRESENT ILLNESS:   Tammy Elliott, a 70 y.o. female, was seen for a [facility] cancer genetics consultation at the request of Dr. Tasia Catchings due to a personal and family history of cancer.  Tammy Elliott presents to clinic today to discuss the possibility of a hereditary predisposition to cancer, genetic testing, and to further clarify her future cancer risks, as well as potential cancer risks for family members.   In 2021, at the age of 77, Tammy Elliott was diagnosed with invasive mammary carcinoma of the left breast, ER+, PR+, Her2-. The treatment plan included mastectomy.   CANCER HISTORY:  Oncology History  Malignant neoplasm of upper-outer quadrant of left breast in female, estrogen receptor positive ([place])  03/23/2019 Initial Diagnosis   Malignant neoplasm of upper-outer quadrant of left breast in female, estrogen receptor positive ([place])   03/23/2019 Cancer Staging   Staging form: Breast, AJCC 8th Edition - Clinical: Stage IA (cT1b, cN0, cM0, G2, ER+, PR+, HER2-) - Signed by Earlie Server, MD on 03/23/2019   05/29/2019 Cancer Staging   Staging form: Breast, AJCC 8th Edition - Pathologic: Stage IA (pT2, pN0, cM0, G2, ER+, PR+, HER2-, Oncotype DX score: 19) - Signed by Earlie Server, MD on 05/29/2019      RISK FACTORS:  Menarche was at age 76.  OCP use for approximately 10 -15 years.  Ovaries intact: yes.  Hysterectomy: no.  Menopausal status: postmenopausal.  HRT use: 0 years. Colonoscopy: yes; normal. Mammogram within the last year: yes. Number of breast biopsies: 1.  Past Medical History:  Diagnosis Date   Basal cell carcinoma    basal cell -skin cancer s/p removal   Breast cancer ([place]) 02/2019    left   CAD S/P percutaneous coronary angioplasty 2012   Dr. Saralyn Pilar ([place], [place]): DES PCI to LAD   DM (diabetes mellitus), type 2 with complications ([place])    CAD   Family history of uterine cancer    GERD (gastroesophageal reflux disease)    Hypercholesterolemia    Hyperlipidemia associated with type 2 diabetes mellitus ([place])    On lovastatin    Past Surgical History:  Procedure Laterality Date   BREAST BIOPSY Left 03/11/2019   stereo biopsy/ x clip/path pending   BREAST RECONSTRUCTION WITH PLACEMENT OF TISSUE EXPANDER AND FLEX HD (ACELLULAR HYDRATED DERMIS) Left 05/03/2019   Procedure: LEFT BREAST RECONSTRUCTION WITH PLACEMENT OF TISSUE EXPANDER AND FLEX HD (ACELLULAR HYDRATED DERMIS);  Surgeon: Wallace Going, DO;  Location: [facility];  Service: Plastics;  Laterality: Left;   CARDIAC CATHETERIZATION     CHOLECYSTECTOMY     COLONOSCOPY     CORONARY STENT INTERVENTION  2012   Dr. Saralyn Pilar ([place]): DES PCI pLAD   LIPOSUCTION WITH LIPOFILLING Bilateral 01/27/2020   Procedure: LIPOSUCTION WITH LIPOFILLING;  Surgeon: Wallace Going, DO;  Location: [facility];  Service: Plastics;  Laterality: Bilateral;  90 min   MASTECTOMY Left 02/2019   MASTOPEXY Right 07/01/2019   Procedure: MASTOPEXY;  Surgeon: Wallace Going, DO;  Location: [facility];  Service: Plastics;  Laterality: Right;   NM GATED MYOVIEW (ARMX HX)  05/09/2017  [facility]) exercised 8:36 min with no chest pain or dyspnea.  No EKG changes.  Normal EF 66%.  No ischemia or infarction)   REMOVAL OF TISSUE EXPANDER AND PLACEMENT OF IMPLANT Left 07/01/2019   Procedure: REMOVAL OF TISSUE EXPANDER AND PLACEMENT OF IMPLANT;  Surgeon: Wallace Going, DO;  Location: [facility];  Service: Plastics;  Laterality: Left;  total case 2.5 hours   TOTAL MASTECTOMY Left 05/03/2019   Procedure: TOTAL MASTECTOMY W/  Sentinel Node;  Surgeon: Herbert Pun, MD;  Location: [facility];  Service: General;  Laterality: Left;   VISCERAL ANGIOGRAPHY N/A 05/01/2020   Procedure: VISCERAL ANGIOGRAPHY;  Surgeon: Algernon Huxley, MD;  Location: [facility];  Service: Cardiovascular;  Laterality: N/A;    Social History   Socioeconomic History   Marital status: Married    Spouse name: Not on file   Number of children: Not on file   Years of education: Not on file   Highest education level: Not on file  Occupational History   Occupation: Analyst    Employer: [employer]  Tobacco Use   Smoking status: Never   Smokeless tobacco: Never  Vaping Use   Vaping Use: Never used  Substance and Sexual Activity   Alcohol use: Not Currently   Drug use: Never   Sexual activity: Not on file  Other Topics Concern   Not on file  Social History Narrative   reports that she has never smoked. She has never used smokeless tobacco. She reports that she rarely drinks alcohol. She reports that she does not use drugs.      Married to [name] ([name]) [name]    Social Determinants of Health   Financial Resource Strain: Not on file  Food Insecurity: Not on file  Transportation Needs: Not on file  Physical Activity: Not on file  Stress: Not on file  Social Connections: Not on file     FAMILY HISTORY:  We obtained a detailed, 4-generation family history.  Significant diagnoses are listed below: Family History  Problem Relation Age of Onset   CAD Mother    Diabetes Mellitus II Mother    Hypertension Mother    Uterine cancer Mother        dx late 31s   CAD Father    Heart attack Father    Hypertension Father    Diabetes Mellitus II Father        Took insulin   Alcohol abuse Father    Diabetes Mellitus II Sister    Cancer Sister        possible cervical or uterine   Lung cancer Paternal Aunt    Cancer Paternal Uncle        unk type   Diabetes Other        Almost all sisters are diabetic or pre-diabetic     Cancer Cousin        possible colon/stomach/something in abdomen   Breast cancer Neg Hx    Tammy Elliott has 6 sisters. One possible had cancer in her 69s, either cervical or uterine.  Tammy Elliott mother had uterine cancer at 54 and died at 81, she did not have any biological siblings. Maternal grandparents did not have cancer and died in their 54s.   Tammy Elliott father died at 44 due to heart issues. Patient had 1 paternal aunt, 1 paternal uncle. Her uncle had an unknown type of cancer, and his son had stomach/colon/abdominal cancer. Paternal aunt had lung cancer and  died at 72. Tammy Elliott paternal grandfather died in his 38s due to an accident and grandmother died at 30.  Tammy Elliott is unaware of previous family history of genetic testing for hereditary cancer risks. Patient's maternal ancestors are of German/unknown descent, and paternal ancestors are of unknown descent. There is no reported Ashkenazi Jewish ancestry. There is no known consanguinity.    GENETIC COUNSELING ASSESSMENT: Tammy Elliott is a 70 y.o. female with a personal and family history which is somewhat suggestive of a hereditary cancer syndrome and predisposition to cancer. We, therefore, discussed and recommended the following at today's visit.   DISCUSSION: We discussed that approximately 5-10% of breast cancer is hereditary. Most cases of hereditary breast cancer are associated with BRCA1/BRCA2 genes, although there are other genes associated with hereditary cancer as well. Cancers and risks are gene specific.  We discussed that testing is beneficial for several reasons including knowing about other cancer risks, identifying potential screening and risk-reduction options that may be appropriate, and to understand if other family members could be at risk for cancer and allow them to undergo genetic testing.   We reviewed the characteristics, features and inheritance patterns of hereditary cancer syndromes. We also discussed genetic  testing, including the appropriate family members to test, the process of testing, insurance coverage and turn-around-time for results. We discussed the implications of a negative, positive and/or variant of uncertain significant result. We recommended Tammy Elliott pursue genetic testing for the [facility] Multi-Cancer+RNA gene panel.   The Multi-Cancer Panel + RNA offered by [facility] includes sequencing and/or deletion duplication testing of the following 84 genes: AIP, ALK, APC, ATM, AXIN2,BAP1,  BARD1, BLM, BMPR1A, BRCA1, BRCA2, BRIP1, CASR, CDC73, CDH1, CDK4, CDKN1B, CDKN1C, CDKN2A (p14ARF), CDKN2A (p16INK4a), CEBPA, CHEK2, CTNNA1, DICER1, DIS3L2, EGFR (c.2369C>T, p.Thr790Met variant only), EPCAM (Deletion/duplication testing only), FH, FLCN, GATA2, GPC3, GREM1 (Promoter region deletion/duplication testing only), HOXB13 (c.251G>A, p.Gly84Glu), HRAS, KIT, MAX, MEN1, MET, MITF (c.952G>A, p.Glu318Lys variant only), MLH1, MSH2, MSH3, MSH6, MUTYH, NBN, NF1, NF2, NTHL1, PALB2, PDGFRA, PHOX2B, PMS2, POLD1, POLE, POT1, PRKAR1A, PTCH1, PTEN, RAD50, RAD51C, RAD51D, RB1, RECQL4, RET, RUNX1, SDHAF2, SDHA (sequence changes only), SDHB, SDHC, SDHD, SMAD4, SMARCA4, SMARCB1, SMARCE1, STK11, SUFU, TERC, TERT, TMEM127, TP53, TSC1, TSC2, VHL, WRN and WT1.   Based on Tammy Elliott's personal and family history of cancer, she does not medical criteria for genetic testing. She may have an out of pocket cost. Although she does not technically meet criteria, she would like to know if having a right mastectomy would be an option instead of screening since screening did not identify her left breast cancer.  PLAN: After considering the risks, benefits, and limitations,  Tammy Elliott did not wish to pursue genetic testing at today's visit. We understand this decision and remain available to coordinate genetic testing at any time in the future. We, therefore, recommend Tammy Elliott continue to follow the cancer screening guidelines given by her  primary healthcare provider.  Lastly, we encouraged Tammy Elliott to remain in contact with cancer genetics annually so that we can continuously update the family history and inform her of any changes in cancer genetics and testing that may be of benefit for this family.   Tammy Elliott questions were answered to her satisfaction today. Our contact information was provided should additional questions or concerns arise. Thank you for the referral and allowing [place] to share in the care of your patient.   Faith Rogue, MS, [facility] Genetic Counselor [email or web address] Phone: [phone]  The patient was seen  for a total of 30 minutes in face-to-face genetic counseling.  Patient was seen alone. Dr. Grayland Ormond was available for discussion regarding this case.   _______________________________________________________________________ For Office Staff:  Number of people involved in session: 1 Was an Intern/ student involved with case: no

## 2020-11-07 ENCOUNTER — Encounter
Admission: RE | Admit: 2020-11-07 | Discharge: 2020-11-07 | Disposition: A | Discharge status: Home / Self Care | Payer: Medicare Other | Source: Ambulatory Visit | Attending: General Surgery | Admitting: General Surgery

## 2020-11-07 ENCOUNTER — Other Ambulatory Visit: Payer: Self-pay

## 2020-11-07 NOTE — Patient Instructions (Addendum)
Your procedure is scheduled on: November 17, 2020 FRIDAY Report to the Registration Desk on the 1st floor of the [facility]. To find out your arrival time, please call [phone] between 1PM - 3PM on: November 16, 2020 THURSDAY  REMEMBER: Instructions that are not followed completely may result in serious medical risk, up to and including death; or upon the discretion of your surgeon and anesthesiologist your surgery may need to be rescheduled.  DO NOT EAT after midnight the night before surgery.  No gum chewing, lozengers or hard candies.                       You may drink clear liquids up to 2 hours                 before you are scheduled to arrive for your surgery- DO not drink clear                 liquids within 2 hours of the start of your surgery.                 Clear Liquids include:  water, apple juice without pulp, clear Gatorade, G2 or                  Gatorade Zero (avoid Red/Purple/Blue), Black Coffee or Tea (Do not add                 anything to coffee or tea).   TAKE THESE MEDICATIONS THE MORNING OF SURGERY WITH A SIP OF WATER: lansoprazole (PREVACID) 15 MG capsule  (take one the night before and one on the morning of surgery - helps to prevent nausea after surgery.)  LAST DOSE OF METFORMIN and JANUMET November 14, 2020 Tuesday  LAST DOSE OF JARDIANCE November 13, 2020 Monday  LAST DOSE OF PLAVIX Sat. November 11, 2020  One week prior to surgery: Stop Anti-inflammatories (NSAIDS) such as Advil, Aleve, Ibuprofen, Motrin, Naproxen, Naprosyn and ASPIRIN OR Aspirin based products such as Excedrin, Goodys Powder, BC Powder. Stop ANY OVER THE COUNTER supplements until after surgery. You may however, continue to take Tylenol if needed for pain up until the day of surgery.  No Alcohol for 24 hours before or after surgery.  No Smoking including e-cigarettes for 24 hours prior to surgery.  No chewable tobacco products for at least 6 hours prior to surgery.  No  nicotine patches on the day of surgery.  Do not use any "recreational" drugs for at least a week prior to your surgery.  Please be advised that the combination of cocaine and anesthesia may have negative outcomes, up to and including death. If you test positive for cocaine, your surgery will be cancelled.  On the morning of surgery brush your teeth with toothpaste and water, you may rinse your mouth with mouthwash if you wish. Do not swallow any toothpaste or mouthwash.  Do not wear jewelry, make-up, hairpins, clips or nail polish.  Do not wear lotions, powders, or perfumes OR DEODORANT.   Do not shave body from the neck down 48 hours prior to surgery just in case you cut yourself which could leave a site for infection.  Also, freshly shaved skin may become irritated if using the CHG soap.  Contact lenses, hearing aids and dentures may not be worn into surgery.  Do not bring valuables to the hospital. [facility] is not responsible for any missing/lost belongings or valuables.   Use  CHG Soap as directed on instruction sheet.  Notify your doctor if there is any change in your medical condition (cold, fever, infection).  Wear comfortable clothing (specific to your surgery type) to the hospital.  After surgery, you can help prevent lung complications by doing breathing exercises.  Take deep breaths and cough every 1-2 hours. Your doctor may order a device called an Incentive Spirometer to help you take deep breaths. When coughing or sneezing, hold a pillow firmly against your incision with both hands. This is called "splinting." Doing this helps protect your incision. It also decreases belly discomfort.  If you are being discharged the day of surgery, you will not be allowed to drive home. You will need a responsible adult (18 years or older) to drive you home and stay with you that night.   If you are taking public transportation, you will need to have a responsible adult (18 years or  older) with you. Please confirm with your physician that it is acceptable to use public transportation.   Please call the [facility] at [phone] if you have any questions about these instructions.  Surgery Visitation Policy:  Patients undergoing a surgery or procedure may have one family member or support person with them as long as that person is not COVID-19 positive or experiencing its symptoms.  That person may remain in the waiting area during the procedure.

## 2020-11-08 ENCOUNTER — Other Ambulatory Visit
Admission: RE | Admit: 2020-11-08 | Discharge: 2020-11-08 | Disposition: A | Discharge status: Home / Self Care | Payer: Medicare Other | Source: Ambulatory Visit | Attending: General Surgery | Admitting: General Surgery

## 2020-11-08 DIAGNOSIS — Z01810 Encounter for preprocedural cardiovascular examination: Secondary | ICD-10-CM | POA: Diagnosis not present

## 2020-11-10 ENCOUNTER — Encounter: Payer: Self-pay | Admitting: General Surgery

## 2020-11-10 NOTE — Progress Notes (Signed)
Perioperative Services  Pre-Admission/Anesthesia Testing Clinical Review  Date: 11/10/20  Patient Demographics:  Name: Tammy Elliott DOB:   11/21/1950 MRN:   [id number]  Planned Surgical Procedure(s):    Case: [id number] Date/Time: 11/17/20 1200   Procedure: EXCISION OF BREAST BIOPSY (Left: Breast)   Anesthesia type: Monitor Anesthesia Care   Pre-op diagnosis: N63.22 Mass of upper inner quadrant of lt breast   Location: [facility] OR ROOM 09 / [place] ORS FOR ANESTHESIA GROUP   Surgeons: Herbert Pun, MD     NOTE: Available PAT nursing documentation and vital signs have been reviewed. Clinical nursing staff has updated patient's PMH/PSHx, current medication list, and drug allergies/intolerances to ensure comprehensive history available to assist in medical decision making as it pertains to the aforementioned surgical procedure and anticipated anesthetic course. Extensive review of available clinical information performed. [place] PMH and PSHx updated with any diagnoses/procedures that  may have been inadvertently omitted during her intake with the pre-admission testing department's nursing staff.  Clinical Discussion:  Tammy Elliott is a 70 y.o. female who is submitted for pre-surgical anesthesia review and clearance prior to her undergoing the above procedure. Patient has never been a smoker. Pertinent PMH includes: CAD, ischemic heart disease, aortic atherosclerosis, superior mesenteric artery stenosis, HLD, T2DM, DOE, LEFT breast cancer.  Patient is followed by cardiology Saralyn Pilar, MD). She was last seen in the cardiology clinic on 10/16/2020; notes reviewed.  At the time of her clinic visit, patient reported that she was "doing good".  Patient denied any episodes of chest pain, however continue to experience chronic exertional dyspnea which was reported to be at baseline.  Patient denied any PND, orthopnea, significant peripheral edema, palpitations, significant peripheral edema,  vertiginous symptoms, or presyncope/syncope.  PMH significant for cardiovascular diagnoses.  Patient underwent diagnostic left heart catheterization on 04/12/2010 an 85% stenosis of the proximal LAD. PCI was performed and a 2.5 x 15 mm Xience V DES x 1 was placed with excellent angiographic result.  Myocardial perfusion imaging studies performed on 11/01/2013, 10/27/15, 05/09/2017, and 11/01/2019 all revealed normal left ventricular systolic function.  There were no regional wall motion abnormalities or evidence of ischemia/scar.  Studies all determined to be low risk and normal.  TTE performed on 10/29/2019 revealed a normal left ventricular systolic function with an EF >55%.  There was trivial to mild mitral, tricuspid, and pulmonary valve regurgitation.  Vascular mesenteric duplex performed on 04/10/2020 demonstrated a 70-99% stenosis of the superior mesenteric artery. Celiac, splenic, and hepatic arteries normal.  Elevated velocities in the proximal SMA suggestive of a >70-99% stenosis.  Patient chronically anticoagulated using daily rivaroxaban; compliant with therapy with no evidence of GI bleeding.  Pressure mildly elevated at 140/80; takes no interventions.  Patient is on a statin for her HLD.  T2DM reasonably controlled on currently prescribed regimen; last Hgb A1c was 7.3% when checked on 10/10/2020. Functional capacity, as defined by DASI, is documented as being >/= 4 METS.  No changes were made to her medication regimen.  Patient to follow-up with outpatient cardiology in 6 months or sooner if needed.  Tammy Elliott is scheduled to undergo an excisional biopsy of a LEFT breast mass on 11/17/2020 with Dr. Herbert Pun, MD.  Given patient's past medical history significant for cardiovascular disease and intervention, presurgical cardiac clearance was sought by the PAT team. Per cardiology, "this patient is optimized for surgery and may proceed with the planned procedural course with a  LOW risk of significant perioperative cardiovascular complications".  Again, this patient is on daily anticoagulation therapy.  She has been instructed on recommendations from her cardiologist for holding her rivaroxaban for 5 days prior to her procedure with plans to restart as soon as postoperative bleeding risk felt to be minimized by primary attending surgeon.  The patient is aware that her last dose of rivaroxaban will be on 11/11/2020.  Patient denies previous perioperative complications with anesthesia in the past. In review of the available records, it is noted that patient underwent a general anesthetic course at [facility] ([facility] in 01/2020 without documented complications.   Vitals with BMI 11/07/2020 10/18/2020 05/22/2020  Height '5\' 8"'  - -  Weight 180 lbs 5 oz 180 lbs 5 oz 178 lbs 13 oz  BMI 84.66 - 59.93  Systolic - 570 177  Diastolic - 71 74  Pulse - 80 78    Providers/Specialists:   NOTE: Primary physician provider listed below. Patient may have been seen by APP or partner within same practice.   PROVIDER ROLE / SPECIALTY LAST Tanna Savoy, MD General Surgery  10/24/2020  Baxter Hire, MD Primary Care Provider  10/17/2020  Isaias Cowman, MD Cardiology  10/16/2020  Lavone Orn, MD Endocrinology  09/06/2020   Allergies:  Penicillins, Exenatide, Orlistat, and Thiazide-type diuretics  Current Home Medications:   No current facility-administered medications for this encounter.    anastrozole (ARIMIDEX) 1 MG tablet   CALCIUM PO   CALCIUM-MAGNESIUM-ZINC PO   clopidogrel (PLAVIX) 75 MG tablet   glimepiride (AMARYL) 4 MG tablet   JANUMET 50-500 MG tablet   JARDIANCE 25 MG TABS tablet   lansoprazole (PREVACID) 15 MG capsule   lovastatin (MEVACOR) 40 MG tablet   metFORMIN (GLUCOPHAGE) 500 MG tablet   Multiple Vitamin (MULTIVITAMIN) tablet   Vitamin D, Cholecalciferol, 25 MCG (1000 UT) TABS   glucose blood (CONTOUR NEXT TEST) test  strip   History:   Past Medical History:  Diagnosis Date   Aortic atherosclerosis (HCC)    CAD (coronary artery disease) 04/12/2010   a.) PCI --> 85% pLAD --> 2.5 x 15 mm Xience V DES x 1   Chronic anticoagulation    Rivaroxaban   Chronic ischemic heart disease    DOE (dyspnea on exertion)    Family history of uterine cancer    GERD (gastroesophageal reflux disease)    Hypercholesterolemia    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 ([place]) 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   a.) Stage IA (cT1b, cN0, cM0, G2, ER+, PR+, HER2-); b.) adjuvant endocrine therapy started 06/2019; c.) s/p total mastectomy, SNLB, and breast reconstruction in 04/2019   Skin cancer, basal cell    s/p excision   Superior mesenteric artery stenosis (HCC)    T2DM (type 2 diabetes mellitus) ([place])    Past Surgical History:  Procedure Laterality Date   BREAST BIOPSY Left 03/11/2019   Stereotactic CNB; pathology --> ER/PR (-), HER2/neu (-) IMC iwth lobular features   BREAST RECONSTRUCTION WITH PLACEMENT OF TISSUE EXPANDER AND FLEX HD (ACELLULAR HYDRATED DERMIS) Left 05/03/2019   Procedure: LEFT BREAST RECONSTRUCTION WITH PLACEMENT OF TISSUE EXPANDER AND FLEX HD (ACELLULAR HYDRATED DERMIS);  Surgeon: Wallace Going, DO;  Location: [facility] ORS;  Service: Plastics;  Laterality: Left;   CHOLECYSTECTOMY     COLONOSCOPY     CORONARY ANGIOPLASTY WITH STENT PLACEMENT Left 04/12/2010   Procedure: LHC with placement of 2.5 x 15 mm Xience V DES x 1 to pLAD; Location: [facility]; Surgeon: Isaias Cowman, MD  LIPOSUCTION WITH LIPOFILLING Bilateral 01/27/2020   Procedure: LIPOSUCTION WITH LIPOFILLING;  Surgeon: Wallace Going, DO;  Location: [place];  Service: Plastics;  Laterality: Bilateral;  90 min   MASTOPEXY Right 07/01/2019   Procedure: MASTOPEXY;  Surgeon: Wallace Going, DO;  Location: [place];  Service: Plastics;  Laterality: Right;   REMOVAL OF TISSUE EXPANDER AND PLACEMENT OF  IMPLANT Left 07/01/2019   Procedure: REMOVAL OF TISSUE EXPANDER AND PLACEMENT OF IMPLANT;  Surgeon: Wallace Going, DO;  Location: [place];  Service: Plastics;  Laterality: Left;  total case 2.5 hours   TOTAL MASTECTOMY Left 05/03/2019   Procedure: TOTAL MASTECTOMY W/ Sentinel Node;  Surgeon: Herbert Pun, MD;  Location: [facility] ORS;  Service: General;  Laterality: Left;   VISCERAL ANGIOGRAPHY N/A 05/01/2020   Procedure: VISCERAL ANGIOGRAPHY;  Surgeon: Algernon Huxley, MD;  Location: [place] [facility];  Service: Cardiovascular;  Laterality: N/A;   Family History  Problem Relation Age of Onset   CAD Mother    Diabetes Mellitus II Mother    Hypertension Mother    Uterine cancer Mother        dx late 19s   CAD Father    Heart attack Father    Hypertension Father    Diabetes Mellitus II Father        Took insulin   Alcohol abuse Father    Diabetes Mellitus II Sister    Cancer Sister        possible cervical or uterine   Lung cancer Paternal Aunt    Cancer Paternal Uncle        unk type   Diabetes Other        Almost all sisters are diabetic or pre-diabetic    Cancer Cousin        possible colon/stomach/something in abdomen   Breast cancer Neg Hx    Social History   Tobacco Use   Smoking status: Never   Smokeless tobacco: Never  Vaping Use   Vaping Use: Never used  Substance Use Topics   Alcohol use: Not Currently    Comment: rarely   Drug use: Never    Pertinent Clinical Results:  LABS: Labs reviewed: Acceptable for surgery.  No visits with results within 3 Day(s) from this visit.  Latest known visit with results is:  Appointment on 10/18/2020  Component Date Value Ref Range Status   Sodium 10/18/2020 136  135 - 145 mmol/L Final   Potassium 10/18/2020 4.0  3.5 - 5.1 mmol/L Final   Chloride 10/18/2020 102  98 - 111 mmol/L Final   CO2 10/18/2020 24  22 - 32 mmol/L Final   Glucose, Bld 10/18/2020 93  70 - 99 mg/dL Final   Glucose  reference range applies only to samples taken after fasting for at least 8 hours.   BUN 10/18/2020 29 (A) 8 - 23 mg/dL Final   Creatinine, Ser 10/18/2020 0.71  0.44 - 1.00 mg/dL Final   Calcium 10/18/2020 9.1  8.9 - 10.3 mg/dL Final   Total Protein 10/18/2020 7.4  6.5 - 8.1 g/dL Final   Albumin 10/18/2020 4.2  3.5 - 5.0 g/dL Final   AST 10/18/2020 24  15 - 41 U/L Final   ALT 10/18/2020 19  0 - 44 U/L Final   Alkaline Phosphatase 10/18/2020 55  38 - 126 U/L Final   Total Bilirubin 10/18/2020 0.8  0.3 - 1.2 mg/dL Final   GFR, Estimated 10/18/2020 >60  >  60 mL/min Final   Comment: (NOTE) Calculated using the CKD-EPI Creatinine Equation (2021)    Anion gap 10/18/2020 10  5 - 15 Final   Performed at [facility], [place], [place] [address]   WBC 10/18/2020 5.1  4.0 - 10.5 K/uL Final   RBC 10/18/2020 4.30  3.87 - 5.11 MIL/uL Final   Hemoglobin 10/18/2020 13.6  12.0 - 15.0 g/dL Final   HCT 10/18/2020 40.6  36.0 - 46.0 % Final   MCV 10/18/2020 94.4  80.0 - 100.0 fL Final   MCH 10/18/2020 31.6  26.0 - 34.0 pg Final   MCHC 10/18/2020 33.5  30.0 - 36.0 g/dL Final   RDW 10/18/2020 12.5  11.5 - 15.5 % Final   Platelets 10/18/2020 216  150 - 400 K/uL Final   nRBC 10/18/2020 0.0  0.0 - 0.2 % Final   Neutrophils Relative % 10/18/2020 51  % Final   Neutro Abs 10/18/2020 2.6  1.7 - 7.7 K/uL Final   Lymphocytes Relative 10/18/2020 37  % Final   Lymphs Abs 10/18/2020 1.9  0.7 - 4.0 K/uL Final   Monocytes Relative 10/18/2020 9  % Final   Monocytes Absolute 10/18/2020 0.5  0.1 - 1.0 K/uL Final   Eosinophils Relative 10/18/2020 2  % Final   Eosinophils Absolute 10/18/2020 0.1  0.0 - 0.5 K/uL Final   Basophils Relative 10/18/2020 1  % Final   Basophils Absolute 10/18/2020 0.1  0.0 - 0.1 K/uL Final   Immature Granulocytes 10/18/2020 0  % Final   Abs Immature Granulocytes 10/18/2020 0.02  0.00 - 0.07 K/uL Final   Performed at [facility], [address] [place], [place]  [address]    ECG: Date: 11/08/2020 Time ECG obtained: 1246 PM Rate: 75 bpm Rhythm:  Sinus rhythm with PACs Axis (leads I and aVF): Normal Intervals: PR 178 ms. QRS 78 ms. QTc 466 ms. ST segment and T wave changes: No evidence of acute ST segment elevation or depression Comparison: Similar to previous tracing obtained on 03/25/2019. PACs now present.    IMAGING / PROCEDURES: VASCULAR MESENTERIC DUPLEX performed on 04/10/2020 Normal celiac artery Normal splenic artery Normal hepatic artery 70-99% stenosis of the superior mesenteric artery Elevated velocities seen in the proximal SMA suggestive of a >70-99% stenosis  LEXISCAN performed on 11/01/2019 LVEF 72% Normal myocardial thickening and wall motion No artifacts noted Left ventricular cavity size normal No evidence of stress-induced myocardial ischemia or arrhythmia Normal low risk study  TRANSTHORACIC ECHOCARDIOGRAM performed on 10/29/2019 LVEF >55% Normal left ventricular systolic function Normal right ventricular systolic function Trivial MR and PR Mild TR No AR No valvular stenosis No evidence of a pericardial effusion  LEFT HEART CATHETERIZATION AND CORONARY ANGIOGRAPHY performed on 04/12/2010 85% stenosis of the proximal LAD Successful PCI with placement of a 2.5 x 15 mm Xience V DEX x 1 to the pLAD Excellent angiographic result  Impression and Plan:  Tammy Elliott has been referred for pre-anesthesia review and clearance prior to her undergoing the planned anesthetic and procedural courses. Available labs, pertinent testing, and imaging results were personally reviewed by me. This patient has been appropriately cleared by cardiology with an overall LOW risk of significant perioperative cardiovascular complications.  Based on clinical review performed today (11/10/20), barring any significant acute changes in the patient's overall condition, it is anticipated that she will be able to proceed with the planned surgical  intervention. Any acute changes in clinical condition may necessitate her procedure  being postponed and/or cancelled. Patient will meet with anesthesia team (MD and/or CRNA) on the day of her procedure for preoperative evaluation/assessment. Questions regarding anesthetic course will be fielded at that time.   Pre-surgical instructions were reviewed with the patient during her PAT appointment and questions were fielded by PAT clinical staff. Patient was advised that if any questions or concerns arise prior to her procedure then she should return a call to PAT and/or her surgeon's office to discuss.  Honor Loh, MSN, APRN, FNP-C, CEN [facility]  Peri-operative Services Nurse Practitioner Phone: [phone] Fax: [phone] 11/10/20 9:09 AM  NOTE: This note has been prepared using Dragon dictation software. Despite my best ability to proofread, there is always the potential that unintentional transcriptional errors may still occur from this process.

## 2020-11-17 ENCOUNTER — Ambulatory Visit
Admission: RE | Admit: 2020-11-17 | Discharge: 2020-11-17 | Disposition: A | Discharge status: Home / Self Care | Payer: Medicare Other | Source: Ambulatory Visit | Attending: General Surgery | Admitting: General Surgery

## 2020-11-17 ENCOUNTER — Other Ambulatory Visit: Payer: Self-pay

## 2020-11-17 ENCOUNTER — Ambulatory Visit: Payer: Medicare Other | Admitting: Urgent Care

## 2020-11-17 ENCOUNTER — Encounter
Admission: RE | Disposition: A | Discharge status: Home / Self Care | Payer: Self-pay | Source: Ambulatory Visit | Attending: General Surgery

## 2020-11-17 ENCOUNTER — Encounter: Payer: Self-pay | Admitting: General Surgery

## 2020-11-17 DIAGNOSIS — Z79899 Other long term (current) drug therapy: Secondary | ICD-10-CM | POA: Diagnosis not present

## 2020-11-17 DIAGNOSIS — Z79811 Long term (current) use of aromatase inhibitors: Secondary | ICD-10-CM | POA: Diagnosis not present

## 2020-11-17 DIAGNOSIS — Z85828 Personal history of other malignant neoplasm of skin: Secondary | ICD-10-CM | POA: Diagnosis not present

## 2020-11-17 DIAGNOSIS — Z853 Personal history of malignant neoplasm of breast: Secondary | ICD-10-CM | POA: Diagnosis not present

## 2020-11-17 DIAGNOSIS — N6489 Other specified disorders of breast: Secondary | ICD-10-CM | POA: Diagnosis not present

## 2020-11-17 DIAGNOSIS — Z7902 Long term (current) use of antithrombotics/antiplatelets: Secondary | ICD-10-CM | POA: Insufficient documentation

## 2020-11-17 DIAGNOSIS — Z7984 Long term (current) use of oral hypoglycemic drugs: Secondary | ICD-10-CM | POA: Insufficient documentation

## 2020-11-17 DIAGNOSIS — N6032 Fibrosclerosis of left breast: Secondary | ICD-10-CM | POA: Diagnosis not present

## 2020-11-17 DIAGNOSIS — N6322 Unspecified lump in the left breast, upper inner quadrant: Secondary | ICD-10-CM | POA: Diagnosis present

## 2020-11-17 DIAGNOSIS — C50912 Malignant neoplasm of unspecified site of left female breast: Secondary | ICD-10-CM | POA: Insufficient documentation

## 2020-11-17 DIAGNOSIS — Z880 Allergy status to penicillin: Secondary | ICD-10-CM | POA: Diagnosis not present

## 2020-11-17 DIAGNOSIS — Z9012 Acquired absence of left breast and nipple: Secondary | ICD-10-CM | POA: Diagnosis not present

## 2020-11-17 DIAGNOSIS — Z170 Estrogen receptor positive status [ER+]: Secondary | ICD-10-CM | POA: Insufficient documentation

## 2020-11-17 DIAGNOSIS — Z888 Allergy status to other drugs, medicaments and biological substances status: Secondary | ICD-10-CM | POA: Insufficient documentation

## 2020-11-17 HISTORY — DX: Other forms of dyspnea: R06.09

## 2020-11-17 HISTORY — DX: Long term (current) use of anticoagulants: Z79.01

## 2020-11-17 HISTORY — DX: Atherosclerosis of aorta: I70.0

## 2020-11-17 HISTORY — DX: Basal cell carcinoma of skin, unspecified: C44.91

## 2020-11-17 HISTORY — DX: Chronic vascular disorders of intestine: K55.1

## 2020-11-17 HISTORY — DX: Dyspnea, unspecified: R06.00

## 2020-11-17 HISTORY — DX: Chronic ischemic heart disease, unspecified: I25.9

## 2020-11-17 HISTORY — PX: EXCISION OF BREAST BIOPSY: SHX5822

## 2020-11-17 HISTORY — DX: Type 2 diabetes mellitus without complications: E11.9

## 2020-11-17 LAB — GLUCOSE, CAPILLARY
Glucose-Capillary: 219 mg/dL — ABNORMAL HIGH (ref 70–99)
Glucose-Capillary: 190 mg/dL — ABNORMAL HIGH (ref 70–99)

## 2020-11-17 SURGERY — EXCISION OF BREAST BIOPSY
Anesthesia: General | Site: Breast | Laterality: Left

## 2020-11-17 MED ORDER — CLINDAMYCIN PHOSPHATE 900 MG/50ML IV SOLN
INTRAVENOUS | Status: AC
  Filled 2020-11-17: qty 50

## 2020-11-17 MED ORDER — PROPOFOL 500 MG/50ML IV EMUL
INTRAVENOUS | Status: DC | PRN
  Administered 2020-11-17: 50 ug/kg/min via INTRAVENOUS

## 2020-11-17 MED ORDER — FENTANYL CITRATE (PF) 100 MCG/2ML IJ SOLN
INTRAMUSCULAR | Status: DC | PRN
  Administered 2020-11-17: 50 ug via INTRAVENOUS

## 2020-11-17 MED ORDER — MIDAZOLAM HCL 2 MG/2ML IJ SOLN
INTRAMUSCULAR | Status: AC
  Filled 2020-11-17: qty 2

## 2020-11-17 MED ORDER — CHLORHEXIDINE GLUCONATE 0.12 % MT SOLN
OROMUCOSAL | Status: AC
  Filled 2020-11-17: qty 15

## 2020-11-17 MED ORDER — HYDROCODONE-ACETAMINOPHEN 5-325 MG PO TABS: 1.0000 | ORAL_TABLET | ORAL | 0 refills | Status: AC | PRN

## 2020-11-17 MED ORDER — FENTANYL CITRATE (PF) 100 MCG/2ML IJ SOLN: 25.0000 ug | INTRAMUSCULAR | Status: DC | PRN

## 2020-11-17 MED ORDER — CHLORHEXIDINE GLUCONATE 0.12 % MT SOLN
15.0000 mL | Freq: Once | OROMUCOSAL | Status: AC
  Administered 2020-11-17: 15 mL via OROMUCOSAL

## 2020-11-17 MED ORDER — ORAL CARE MOUTH RINSE: 15.0000 mL | Freq: Once | OROMUCOSAL | Status: AC

## 2020-11-17 MED ORDER — LIDOCAINE HCL (PF) 2 % IJ SOLN
INTRAMUSCULAR | Status: DC | PRN
  Administered 2020-11-17: 80 mg via INTRADERMAL

## 2020-11-17 MED ORDER — STERILE WATER FOR IRRIGATION IR SOLN
Status: DC | PRN
  Administered 2020-11-17: 500 mL

## 2020-11-17 MED ORDER — FENTANYL CITRATE (PF) 250 MCG/5ML IJ SOLN
INTRAMUSCULAR | Status: AC
  Filled 2020-11-17: qty 5

## 2020-11-17 MED ORDER — PROMETHAZINE HCL 25 MG/ML IJ SOLN: 6.2500 mg | INTRAMUSCULAR | Status: DC | PRN

## 2020-11-17 MED ORDER — PROPOFOL 10 MG/ML IV BOLUS
INTRAVENOUS | Status: DC | PRN
  Administered 2020-11-17: 50 mg via INTRAVENOUS

## 2020-11-17 MED ORDER — MIDAZOLAM HCL 2 MG/2ML IJ SOLN
INTRAMUSCULAR | Status: DC | PRN
  Administered 2020-11-17: 2 mg via INTRAVENOUS

## 2020-11-17 MED ORDER — OXYCODONE HCL 5 MG/5ML PO SOLN: 5.0000 mg | Freq: Once | ORAL | Status: DC | PRN

## 2020-11-17 MED ORDER — SODIUM CHLORIDE 0.9 % IV SOLN: INTRAVENOUS | Status: DC

## 2020-11-17 MED ORDER — MEPERIDINE HCL 25 MG/ML IJ SOLN: 6.2500 mg | INTRAMUSCULAR | Status: DC | PRN

## 2020-11-17 MED ORDER — OXYCODONE HCL 5 MG PO TABS: 5.0000 mg | ORAL_TABLET | Freq: Once | ORAL | Status: DC | PRN

## 2020-11-17 MED ORDER — CLINDAMYCIN PHOSPHATE 900 MG/50ML IV SOLN
900.0000 mg | INTRAVENOUS | Status: AC
  Administered 2020-11-17: 900 mg via INTRAVENOUS

## 2020-11-17 MED ORDER — BUPIVACAINE-EPINEPHRINE (PF) 0.5% -1:200000 IJ SOLN
INTRAMUSCULAR | Status: DC | PRN
  Administered 2020-11-17: 23 mL

## 2020-11-17 MED ORDER — PROPOFOL 500 MG/50ML IV EMUL
INTRAVENOUS | Status: AC
  Filled 2020-11-17: qty 50

## 2020-11-17 MED ORDER — BUPIVACAINE-EPINEPHRINE (PF) 0.5% -1:200000 IJ SOLN
INTRAMUSCULAR | Status: AC
  Filled 2020-11-17: qty 30

## 2020-11-17 SURGICAL SUPPLY — 43 items
BLADE SURG 15 STRL LF DISP TIS (BLADE) ×1 IMPLANT
BLADE SURG 15 STRL SS (BLADE) ×2
CHLORAPREP W/TINT 26 (MISCELLANEOUS) ×2 IMPLANT
CNTNR SPEC 2.5X3XGRAD LEK (MISCELLANEOUS) ×1
CONT SPEC 4OZ STER OR WHT (MISCELLANEOUS) ×1
CONT SPEC 4OZ STRL OR WHT (MISCELLANEOUS) ×1
CONTAINER SPEC 2.5X3XGRAD LEK (MISCELLANEOUS) ×1 IMPLANT
DERMABOND ADVANCED (GAUZE/BANDAGES/DRESSINGS) ×1
DERMABOND ADVANCED .7 DNX12 (GAUZE/BANDAGES/DRESSINGS) ×1 IMPLANT
DEVICE DUBIN SPECIMEN MAMMOGRA (MISCELLANEOUS) ×2 IMPLANT
DRAPE LAPAROTOMY TRNSV 106X77 (MISCELLANEOUS) ×2 IMPLANT
ELECT CAUTERY BLADE TIP 2.5 (TIP) ×2
ELECT REM PT RETURN 9FT ADLT (ELECTROSURGICAL) ×2
ELECTRODE CAUTERY BLDE TIP 2.5 (TIP) ×1 IMPLANT
ELECTRODE REM PT RTRN 9FT ADLT (ELECTROSURGICAL) ×1 IMPLANT
GAUZE 4X4 16PLY ~~LOC~~+RFID DBL (SPONGE) ×2 IMPLANT
GLOVE SURG ENC MOIS LTX SZ6.5 (GLOVE) ×2 IMPLANT
GLOVE SURG UNDER POLY LF SZ6.5 (GLOVE) ×2 IMPLANT
GOWN STRL REUS W/ TWL LRG LVL3 (GOWN DISPOSABLE) ×3 IMPLANT
GOWN STRL REUS W/TWL LRG LVL3 (GOWN DISPOSABLE) ×6
KIT MARKER MARGIN INK (KITS) IMPLANT
KIT TURNOVER KIT A (KITS) ×2 IMPLANT
LABEL OR SOLS (LABEL) ×2 IMPLANT
MANIFOLD NEPTUNE II (INSTRUMENTS) ×2 IMPLANT
MARGIN MAP 10MM (MISCELLANEOUS) ×2 IMPLANT
MARKER MARGIN CORRECT CLIP (MARKER) IMPLANT
NDL HYPO 25X1 1.5 SAFETY (NEEDLE) ×1 IMPLANT
NEEDLE HYPO 25X1 1.5 SAFETY (NEEDLE) ×2 IMPLANT
PACK BASIN MINOR ARMC (MISCELLANEOUS) ×2 IMPLANT
RETRACTOR RING XSMALL (MISCELLANEOUS) IMPLANT
RTRCTR WOUND ALEXIS 13CM XS SH (MISCELLANEOUS)
SUT ETHILON 3-0 FS-10 30 BLK (SUTURE) ×2
SUT MNCRL 4-0 (SUTURE) ×2
SUT MNCRL 4-0 27XMFL (SUTURE) ×1
SUT SILK 2 0 SH (SUTURE) ×2 IMPLANT
SUT VIC AB 3-0 SH 27 (SUTURE) ×2
SUT VIC AB 3-0 SH 27X BRD (SUTURE) ×1 IMPLANT
SUTURE EHLN 3-0 FS-10 30 BLK (SUTURE) ×1 IMPLANT
SUTURE MNCRL 4-0 27XMF (SUTURE) ×1 IMPLANT
SYR 10ML LL (SYRINGE) ×2 IMPLANT
SYR BULB IRRIG 60ML STRL (SYRINGE) ×2 IMPLANT
WATER STERILE IRR 1000ML POUR (IV SOLUTION) ×2 IMPLANT
WATER STERILE IRR 500ML POUR (IV SOLUTION) ×2 IMPLANT

## 2020-11-17 NOTE — Op Note (Signed)
Preoperative diagnosis: Left breast mass.  Postoperative diagnosis: Left breast mass.  Procedure: Left excisional biopsy.  Anesthesia: GETA  Surgeon: Dr. Windell Moment  Wound Classification: Clean  Indications:  Patient is a 70 y.o. female with a palpable left breast mass and previous history of breast cancer. Excisional biopsy indicated to rule out malignancy.    Findings: 1. Palpable mass at left breast upper inner quadrant  Description of procedure: The patient was taken to the operating room and placed supine on the operating table, and after general anesthesia was administered, the left chest was prepped and draped in the usual sterile fashion. A time-out was completed verifying correct patient, procedure, site, positioning, and implant(s) and/or special equipment prior to beginning this procedure.  An elliptical skin incision incision was planned adjacent to the palpable mass. Local anesthesia was infiltrated and a skin incision was made. Flaps were raised and the location of the mass confirmed by palpation.   The mass was dissected from surrounding tissues using electrocautery. After removing the lump, the cavity was palpated and no additional abnormalities was palpated.  Hemostasis was achieved with electrocautery. The subcutaneous tissue immediately under the skin was approximated with interrupted 3-0 Vicryl sutures.  No attempt was made to close the dead space. The skin was closed with a subcuticular suture of running monocryl 4-0. A dressing was applied.  The patient tolerated the procedure well and was taken to the postanesthesia care unit in stable condition.   Specimen: Left breast excisional biopsy  Complications: None  Estimated Blood Loss: 44m

## 2020-11-17 NOTE — Anesthesia Preprocedure Evaluation (Signed)
Anesthesia Evaluation  Patient identified by MRN, date of birth, ID band Patient awake    Reviewed: Allergy & Precautions, NPO status , Patient's Chart, lab work & pertinent test results  History of Anesthesia Complications Negative for: history of anesthetic complications  Airway Mallampati: II  TM Distance: >3 FB Neck ROM: Full    Dental no notable dental hx.    Pulmonary neg pulmonary ROS, neg sleep apnea, neg COPD,    breath sounds clear to auscultation- rhonchi (-) wheezing      Cardiovascular (-) hypertension+ CAD and + Cardiac Stents   Rhythm:Regular Rate:Normal - Systolic murmurs and - Diastolic murmurs    Neuro/Psych neg Seizures negative neurological ROS  negative psych ROS   GI/Hepatic Neg liver ROS, GERD  ,  Endo/Other  diabetes, Oral Hypoglycemic Agents  Renal/GU negative Renal ROS     Musculoskeletal negative musculoskeletal ROS (+)   Abdominal (+) - obese,   Peds  Hematology negative hematology ROS (+)   Anesthesia Other Findings Past Medical History: No date: Aortic atherosclerosis ([place]) 04/12/2010: CAD (coronary artery disease)     Comment:  a.) PCI --> 85% pLAD --> 2.5 x 15 mm Xience V DES x 1 No date: Chronic anticoagulation     Comment:  Rivaroxaban No date: Chronic ischemic heart disease No date: DOE (dyspnea on exertion) No date: Family history of uterine cancer No date: GERD (gastroesophageal reflux disease) No date: Hypercholesterolemia 03/11/2019: 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 [facility])     Comment:  a.) Stage IA (cT1b, cN0, cM0, G2, ER+, PR+, HER2-); b.)               adjuvant endocrine therapy started 06/2019; c.) s/p total              mastectomy, SNLB, and breast reconstruction in 04/2019 No date: Skin cancer, basal cell     Comment:  s/p excision No date: Superior mesenteric artery stenosis (HCC) No date: T2DM (type 2 diabetes mellitus) ([place])    Reproductive/Obstetrics                             Anesthesia Physical Anesthesia Plan  ASA: 3  Anesthesia Plan: General   Post-op Pain Management:    Induction: Intravenous  PONV Risk Score and Plan: 2 and Propofol infusion  Airway Management Planned: Natural Airway  Additional Equipment:   Intra-op Plan:   Post-operative Plan:   Informed Consent: I have reviewed the patients History and Physical, chart, labs and discussed the procedure including the risks, benefits and alternatives for the proposed anesthesia with the patient or authorized representative who has indicated his/her understanding and acceptance.     Dental advisory given  Plan Discussed with: CRNA and Anesthesiologist  Anesthesia Plan Comments:         Anesthesia Quick Evaluation

## 2020-11-17 NOTE — Transfer of Care (Signed)
Immediate Anesthesia Transfer of Care Note  Patient: Tammy Elliott  Procedure(s) Performed: EXCISION OF BREAST BIOPSY (Left: Breast)  Patient Location: PACU  Anesthesia Type:MAC  Level of Consciousness: awake and sedated  Airway & Oxygen Therapy: Patient Spontanous Breathing and Patient connected to face mask oxygen  Post-op Assessment: Report given to RN and Post -op Vital signs reviewed and stable  Post vital signs: Reviewed and stable  Last Vitals:  Vitals Value Taken Time  BP 124/51 11/17/20 1206  Temp    Pulse 72 11/17/20 1208  Resp 14 11/17/20 1208  SpO2 99 % 11/17/20 1208  Vitals shown include unvalidated device data.  Last Pain:  Vitals:   11/17/20 1018  TempSrc: Oral  PainSc: 0-No pain         Complications: No notable events documented.

## 2020-11-17 NOTE — Discharge Instructions (Addendum)
  Diet: Resume home heart healthy regular diet.   Activity: Increase activity as tolerated. Light activity and walking are encouraged. Do not drive or drink alcohol if taking narcotic pain medications.  Wound care: May shower with soapy water and pat dry (do not rub incisions), but no baths or submerging incision underwater until follow-up. (no swimming)   Medications: Resume all home medications. For mild to moderate pain: acetaminophen (Tylenol) or ibuprofen (if no kidney disease). Combining Tylenol with alcohol can substantially increase your risk of causing liver disease. Narcotic pain medications, if prescribed, can be used for severe pain, though may cause nausea, constipation, and drowsiness. Do not combine Tylenol and Norco within a 6 hour period as Norco contains Tylenol. If you do not need the narcotic pain medication, you do not need to fill the prescription.  Call office ([phone]) at any time if any questions, worsening pain, fevers/chills, bleeding, drainage from incision site, or other concerns.   AMBULATORY SURGERY  DISCHARGE INSTRUCTIONS   The drugs that you were given will stay in your system until tomorrow so for the next 24 hours you should not:  Drive an automobile Make any legal decisions Drink any alcoholic beverage   You may resume regular meals tomorrow.  Today it is better to start with liquids and gradually work up to solid foods.  You may eat anything you prefer, but it is better to start with liquids, then soup and crackers, and gradually work up to solid foods.   Please notify your doctor immediately if you have any unusual bleeding, trouble breathing, redness and pain at the surgery site, drainage, fever, or pain not relieved by medication.    Additional Instructions:        Please contact your physician with any problems or Same Day Surgery at [phone], Monday through Friday 6 am to 4 pm, or [place] [facility] number at  [phone]. 

## 2020-11-17 NOTE — Anesthesia Postprocedure Evaluation (Signed)
Anesthesia Post Note  Patient: Tammy Elliott  Procedure(s) Performed: EXCISION OF BREAST BIOPSY (Left: Breast)  Patient location during evaluation: PACU Anesthesia Type: General Level of consciousness: awake and alert and oriented Pain management: pain level controlled Vital Signs Assessment: post-procedure vital signs reviewed and stable Respiratory status: spontaneous breathing, nonlabored ventilation and respiratory function stable Cardiovascular status: blood pressure returned to baseline and stable Postop Assessment: no signs of nausea or vomiting Anesthetic complications: no   No notable events documented.   Last Vitals:  Vitals:   11/17/20 1231 11/17/20 1245  BP: 139/68 140/75  Pulse: 72 71  Resp: 18 16  Temp:  36.8 C  SpO2: 99% 99%    Last Pain:  Vitals:   11/17/20 1245  TempSrc: Temporal  PainSc: 0-No pain                 Randilyn Foisy

## 2020-11-17 NOTE — Interval H&P Note (Signed)
History and Physical Interval Note:  11/17/2020 11:04 AM  Tammy Elliott  has presented today for surgery, with the diagnosis of N63.22 Mass of upper inner quadrant of lt breast.  The various methods of treatment have been discussed with the patient and family. After consideration of risks, benefits and other options for treatment, the patient has consented to  Procedure(s): EXCISION OF BREAST BIOPSY (Left) as a surgical intervention.  The patient's history has been reviewed, patient examined, no change in status, stable for surgery.  I have reviewed the patient's chart and labs.  Questions were answered to the patient's satisfaction.     Herbert Pun

## 2020-11-20 LAB — SURGICAL PATHOLOGY

## 2021-01-30 ENCOUNTER — Encounter: Payer: Self-pay | Admitting: Oncology

## 2021-01-30 ENCOUNTER — Other Ambulatory Visit: Payer: Self-pay

## 2021-01-30 MED ORDER — ANASTROZOLE 1 MG PO TABS: 1.0000 mg | ORAL_TABLET | Freq: Every day | ORAL | 1 refills | Status: AC

## 2021-02-27 ENCOUNTER — Ambulatory Visit
Admission: RE | Admit: 2021-02-27 | Discharge: 2021-02-27 | Disposition: A | Discharge status: Home / Self Care | Payer: Medicare Other | Source: Ambulatory Visit | Attending: Oncology | Admitting: Oncology

## 2021-02-27 ENCOUNTER — Other Ambulatory Visit: Payer: Self-pay

## 2021-02-27 DIAGNOSIS — Z1231 Encounter for screening mammogram for malignant neoplasm of breast: Secondary | ICD-10-CM | POA: Diagnosis not present

## 2021-03-31 ENCOUNTER — Emergency Department (HOSPITAL_COMMUNITY): Payer: Medicare Other

## 2021-03-31 ENCOUNTER — Other Ambulatory Visit: Payer: Self-pay

## 2021-03-31 ENCOUNTER — Inpatient Hospital Stay (HOSPITAL_COMMUNITY)
Admission: EM | Admit: 2021-03-31 | Discharge: 2021-04-02 | DRG: 437 | Disposition: A | Discharge status: Home / Self Care | Payer: Medicare Other | Attending: Family Medicine | Admitting: Family Medicine

## 2021-03-31 ENCOUNTER — Encounter (HOSPITAL_COMMUNITY): Payer: Self-pay

## 2021-03-31 DIAGNOSIS — E7800 Pure hypercholesterolemia, unspecified: Secondary | ICD-10-CM | POA: Diagnosis present

## 2021-03-31 DIAGNOSIS — Z888 Allergy status to other drugs, medicaments and biological substances status: Secondary | ICD-10-CM | POA: Diagnosis not present

## 2021-03-31 DIAGNOSIS — Z20822 Contact with and (suspected) exposure to covid-19: Secondary | ICD-10-CM | POA: Diagnosis present

## 2021-03-31 DIAGNOSIS — Z880 Allergy status to penicillin: Secondary | ICD-10-CM

## 2021-03-31 DIAGNOSIS — E119 Type 2 diabetes mellitus without complications: Secondary | ICD-10-CM | POA: Diagnosis present

## 2021-03-31 DIAGNOSIS — Z9012 Acquired absence of left breast and nipple: Secondary | ICD-10-CM

## 2021-03-31 DIAGNOSIS — D0502 Lobular carcinoma in situ of left breast: Secondary | ICD-10-CM | POA: Diagnosis present

## 2021-03-31 DIAGNOSIS — R7401 Elevation of levels of liver transaminase levels: Secondary | ICD-10-CM | POA: Diagnosis present

## 2021-03-31 DIAGNOSIS — Z955 Presence of coronary angioplasty implant and graft: Secondary | ICD-10-CM

## 2021-03-31 DIAGNOSIS — Z8249 Family history of ischemic heart disease and other diseases of the circulatory system: Secondary | ICD-10-CM | POA: Diagnosis not present

## 2021-03-31 DIAGNOSIS — Z85828 Personal history of other malignant neoplasm of skin: Secondary | ICD-10-CM

## 2021-03-31 DIAGNOSIS — R160 Hepatomegaly, not elsewhere classified: Principal | ICD-10-CM

## 2021-03-31 DIAGNOSIS — Z79899 Other long term (current) drug therapy: Secondary | ICD-10-CM

## 2021-03-31 DIAGNOSIS — C787 Secondary malignant neoplasm of liver and intrahepatic bile duct: Principal | ICD-10-CM | POA: Diagnosis present

## 2021-03-31 DIAGNOSIS — I2510 Atherosclerotic heart disease of native coronary artery without angina pectoris: Secondary | ICD-10-CM | POA: Diagnosis present

## 2021-03-31 DIAGNOSIS — Z7984 Long term (current) use of oral hypoglycemic drugs: Secondary | ICD-10-CM | POA: Diagnosis not present

## 2021-03-31 DIAGNOSIS — Z7902 Long term (current) use of antithrombotics/antiplatelets: Secondary | ICD-10-CM | POA: Diagnosis not present

## 2021-03-31 DIAGNOSIS — K219 Gastro-esophageal reflux disease without esophagitis: Secondary | ICD-10-CM | POA: Diagnosis present

## 2021-03-31 DIAGNOSIS — Z9049 Acquired absence of other specified parts of digestive tract: Secondary | ICD-10-CM | POA: Diagnosis not present

## 2021-03-31 DIAGNOSIS — I700 Atherosclerosis of aorta: Secondary | ICD-10-CM | POA: Diagnosis present

## 2021-03-31 DIAGNOSIS — Z853 Personal history of malignant neoplasm of breast: Secondary | ICD-10-CM | POA: Diagnosis not present

## 2021-03-31 LAB — URINALYSIS, ROUTINE W REFLEX MICROSCOPIC
Bacteria, UA: NONE SEEN
Bilirubin Urine: NEGATIVE
Glucose, UA: >=500 mg/dL — AB
Hgb urine dipstick: NEGATIVE
Ketones, ur: NEGATIVE mg/dL
Leukocytes,Ua: NEGATIVE
Nitrite: NEGATIVE
Protein, ur: NEGATIVE mg/dL
Specific Gravity, Urine: 1.026 (ref 1.005–1.030)
pH: 5 (ref 5.0–8.0)

## 2021-03-31 LAB — COMPREHENSIVE METABOLIC PANEL
ALT: 136 U/L — ABNORMAL HIGH (ref 0–44)
AST: 257 U/L — ABNORMAL HIGH (ref 15–41)
Albumin: 3.4 g/dL — ABNORMAL LOW (ref 3.5–5.0)
Alkaline Phosphatase: 259 U/L — ABNORMAL HIGH (ref 38–126)
Anion gap: 16 — ABNORMAL HIGH (ref 5–15)
BUN: 13 mg/dL (ref 8–23)
CO2: 18 mmol/L — ABNORMAL LOW (ref 22–32)
Calcium: 9.2 mg/dL (ref 8.9–10.3)
Chloride: 102 mmol/L (ref 98–111)
Creatinine, Ser: 0.95 mg/dL (ref 0.44–1.00)
GFR, Estimated: >60 mL/min (ref >60)
Glucose, Bld: 151 mg/dL — ABNORMAL HIGH (ref 70–99)
Potassium: 5.4 mmol/L — ABNORMAL HIGH (ref 3.5–5.1)
Sodium: 136 mmol/L (ref 135–145)
Total Bilirubin: 2.5 mg/dL — ABNORMAL HIGH (ref 0.3–1.2)
Total Protein: 6.7 g/dL (ref 6.5–8.1)

## 2021-03-31 LAB — CBC
HCT: 40.9 % (ref 36.0–46.0)
Hemoglobin: 13.4 g/dL (ref 12.0–15.0)
MCH: 31.1 pg (ref 26.0–34.0)
MCHC: 32.8 g/dL (ref 30.0–36.0)
MCV: 94.9 fL (ref 80.0–100.0)
Platelets: 184 10*3/uL (ref 150–400)
RBC: 4.31 MIL/uL (ref 3.87–5.11)
RDW: 13.5 % (ref 11.5–15.5)
WBC: 5.5 10*3/uL (ref 4.0–10.5)
nRBC: 0 % (ref 0.0–0.2)

## 2021-03-31 LAB — GLUCOSE, CAPILLARY: Glucose-Capillary: 211 mg/dL — ABNORMAL HIGH (ref 70–99)

## 2021-03-31 LAB — TROPONIN I (HIGH SENSITIVITY)
Troponin I (High Sensitivity): 6 ng/L (ref <18)
Troponin I (High Sensitivity): 7 ng/L (ref <18)

## 2021-03-31 LAB — LIPASE, BLOOD: Lipase: 67 U/L — ABNORMAL HIGH (ref 11–51)

## 2021-03-31 LAB — RESP PANEL BY RT-PCR (FLU A&B, COVID) ARPGX2
Influenza A by PCR: NEGATIVE
Influenza B by PCR: NEGATIVE
SARS Coronavirus 2 by RT PCR: NEGATIVE

## 2021-03-31 LAB — BRAIN NATRIURETIC PEPTIDE: B Natriuretic Peptide: 47.5 pg/mL (ref 0.0–100.0)

## 2021-03-31 MED ORDER — ACETAMINOPHEN 325 MG PO TABS: 650.0000 mg | ORAL_TABLET | Freq: Four times a day (QID) | ORAL | Status: DC | PRN

## 2021-03-31 MED ORDER — ACETAMINOPHEN 650 MG RE SUPP: 650.0000 mg | Freq: Four times a day (QID) | RECTAL | Status: DC | PRN

## 2021-03-31 MED ORDER — ACETAMINOPHEN 325 MG PO TABS
650.0000 mg | ORAL_TABLET | Freq: Three times a day (TID) | ORAL | Status: DC | PRN
  Administered 2021-04-01: 650 mg via ORAL
  Filled 2021-03-31: qty 2

## 2021-03-31 MED ORDER — EMPAGLIFLOZIN 25 MG PO TABS
25.0000 mg | ORAL_TABLET | Freq: Every day | ORAL | Status: DC
  Administered 2021-04-01: 25 mg via ORAL
  Filled 2021-03-31 (×3): qty 1

## 2021-03-31 MED ORDER — PRAVASTATIN SODIUM 40 MG PO TABS
40.0000 mg | ORAL_TABLET | Freq: Every day | ORAL | Status: DC
  Administered 2021-03-31 – 2021-04-01 (×2): 40 mg via ORAL
  Filled 2021-03-31 (×2): qty 1

## 2021-03-31 MED ORDER — CLOPIDOGREL BISULFATE 75 MG PO TABS
75.0000 mg | ORAL_TABLET | Freq: Every day | ORAL | Status: DC
  Administered 2021-04-01: 75 mg via ORAL
  Filled 2021-03-31: qty 1

## 2021-03-31 MED ORDER — METFORMIN HCL 500 MG PO TABS: 500.0000 mg | ORAL_TABLET | Freq: Two times a day (BID) | ORAL | Status: DC

## 2021-03-31 MED ORDER — IOHEXOL 300 MG/ML  SOLN
75.0000 mL | Freq: Once | INTRAMUSCULAR | Status: AC | PRN
  Administered 2021-03-31: 75 mL via INTRAVENOUS

## 2021-03-31 NOTE — ED Triage Notes (Signed)
Pt bib [facility] from home with complaints of RUQ/ Right flank pain, dizziness, weakness, and shob x a couple weeks. Pt was seen by her PCP who was supposed to refer her to a surgeon but never did. Pt normally able to work out and get around on her own but recently she can't stand for more than ten minutes. EMS vitals: 110/54, 98% RA, 249 CBG

## 2021-03-31 NOTE — H&P (Addendum)
[facility] Admission History and Physical Service Pager: [phone]  Patient name: Tammy Elliott Medical record number: [id number] Date of birth: December 06, 1950 Age: 71 y.o. Gender: female  Primary Care Provider: Baxter Hire, MD Consultants: Tammy Elliott  Code Status: Full  Preferred Emergency Contact: Tammy Elliott [phone]  Chief Complaint: weakness, fatigue, R sided abdominal pain  Assessment and Plan: Tammy Elliott is a 71 y.o. female presenting with weakness, fatigue, R sided abdominal pain. PMH is significant for DM, GERD, HLD, CAD s/p stent in LAD, hx of cholecystitis s/p cholecystectomy, hx SMA ischemia.   Weakness, fatigue 2/2 Liver metastasis   Hx of breast cancer  Patient presents with 6 weeks of SOB, fatigue, weakness, R sided abdominal pain. On presentation vitals stable. On physical exam tenderness to mild palpation of mid epigastric region, RUQ and RLQ. Lipase elevated at 67. K+ elevated at 5.4, AST 257, ALT 136, alk phos 259, T bili 2.5. UA with greater than 500 glucose (she is taking an SGLT2). Trop negative. CT abdomen with extensive ill-defined low-attenuation lesions throughout both lobes of liver compatible with metastatic disease. This is a new finding when compared with previous CT of the abdomen pelvis from 03/02/2020. No primary neoplasm identified within the abdomen or pelvis. CXR unremarkable. Patient has a history of breast cancer in 2021 s/p mastectomy and has been taking Anastrozole 27m. Metastasis likely from her breast cancer. She reports following up with Oncologist about 4 months ago with no evidence of cancer at that time. Oncology was consulted by ED provider and recommended CT chest with contrast tomorrow. Avoiding today given patient has already received contrast. Liver biospy planned for Monday.  - admit to [facility] attending Dr. EGwendlyn Elliott- Oncology following, appreciate recommendations  - vitals per floor - f/u am CMP, CBC -  f/u hepatitis panel, Hep B ab - f/u PT-INR, PTT - CT chest tomorrow - liver biopsy Monday, 1/23 - Tylenol 650 mg q 6 h prn pain  - carb modified and heart healthy diet - npo at midnight  CAD s/p stent in LAD Occurred in 2012. Has been stable on Plavix 75 mg daily - continue Plavix   DM2  Glucose 151. A1c 7.3 in August 2022.  Currently on Metformin 500 mg twice daily, Jardiance 25 mg daily. Per chart review also on Glipizide  - continue Metformin and Jardiance - f/u a1c   HLD Cholesterol 158, triglycerides 242 about 1 year ago. Currently takes Lovastatin 432mdaily, ordered formulary alternative of Pravastatin 4066maily - continue pravastatin 32m85mHx SMA Ischemia Underwent surgery to evaluate blockage and was found to have only 25% blockage so no intervention was indicated.   FEN/GI: carb modified, heart healthy. NPO at midnight  Prophylaxis: Plavix   Disposition: Home after oncology work up   History of Present Illness:  Tammy MAHEUa 70 y82. female presenting with weakness, fatigue, abdominal pain  About 6 weeks ago started with shortness of breath, no energy, would have to sit down when doing tasks. Also started having a pain in her side and would have issues turning and sleeping. Also with increased acid reflux in the middle of stomach and pain in lower. States right sided abdominal pain is a 6/10 a dull and achy pain. Denies feeling this way prior. Denies any recent illness. 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   Basal cell carcinoma 03/28/2020   Diabetes mellitus type 2 with complications ([place]) 16/12/9602   Pelvic pain  in female 03/28/2020   Abdominal pain 03/28/2020   S/P mastectomy, left 05/11/2019   Malignant neoplasm of upper-outer quadrant of left breast in female, estrogen receptor positive ([place]) 03/23/2019   CAD S/P percutaneous coronary angioplasty 05/04/2018   GERD (gastroesophageal reflux disease) 08/22/2014   CAD (coronary artery disease) 10/20/2013   Hyperlipemia 10/20/2013    Past Medical History: Past Medical History:  Diagnosis Date   Aortic atherosclerosis ([place])    CAD (coronary artery disease) 04/12/2010   a.) PCI --> 85% pLAD --> 2.5 x 15 mm Xience V DES x 1   Chronic anticoagulation    Rivaroxaban   Chronic ischemic heart disease    DOE (dyspnea on exertion)    Family history of uterine cancer    GERD (gastroesophageal reflux disease)    Hypercholesterolemia    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   a.) Stage IA (cT1b, cN0, cM0, G2, ER+, PR+, HER2-); b.) adjuvant endocrine therapy started 06/2019; c.) s/p total mastectomy, SNLB, and breast reconstruction in 04/2019   Skin cancer, basal cell    s/p excision   Superior mesenteric artery stenosis ([facility])    T2DM (type 2 diabetes mellitus) ([place])     Past Surgical History: Past Surgical History:  Procedure Laterality Date   BREAST BIOPSY Left 03/11/2019   Stereotactic CNB; pathology --> ER/PR (-), HER2/neu (-) IMC iwth lobular features   BREAST RECONSTRUCTION WITH PLACEMENT OF TISSUE EXPANDER AND FLEX HD (ACELLULAR HYDRATED DERMIS) Left 05/03/2019   Procedure: LEFT BREAST RECONSTRUCTION WITH PLACEMENT OF TISSUE EXPANDER AND FLEX HD (ACELLULAR HYDRATED DERMIS);  Surgeon: Tammy Going, DO;  Location: [facility];  Service: Plastics;  Laterality: Left;   CHOLECYSTECTOMY     COLONOSCOPY     CORONARY ANGIOPLASTY WITH STENT PLACEMENT Left 04/12/2010   Procedure: LHC with placement of 2.5 x 15 mm Xience V DES x 1 to pLAD; Location: [place]; Surgeon: Isaias Cowman, MD   EXCISION OF BREAST BIOPSY  Left 11/17/2020   Procedure: EXCISION OF BREAST BIOPSY;  Surgeon: Herbert Pun, MD;  Location: [facility];  Service: General;  Laterality: Left;   LIPOSUCTION WITH LIPOFILLING Bilateral 01/27/2020   Procedure: LIPOSUCTION WITH LIPOFILLING;  Surgeon: Tammy Going, DO;  Location: [place];  Service: Plastics;  Laterality: Bilateral;  90 min   MASTOPEXY Right 07/01/2019   Procedure: MASTOPEXY;  Surgeon: Tammy Going, DO;  Location: [place];  Service: Plastics;  Laterality: Right;   REMOVAL OF TISSUE EXPANDER AND PLACEMENT OF IMPLANT Left 07/01/2019   Procedure: REMOVAL OF TISSUE EXPANDER AND PLACEMENT OF IMPLANT;  Surgeon: Tammy Going, DO;  Location: [place];  Service: Plastics;  Laterality: Left;  total case 2.5 hours   TOTAL MASTECTOMY Left 05/03/2019   Procedure: TOTAL MASTECTOMY W/ Sentinel Node;  Surgeon: Herbert Pun, MD;  Location: [facility];  Service: General;  Laterality: Left;   VISCERAL ANGIOGRAPHY N/A 05/01/2020   Procedure: VISCERAL ANGIOGRAPHY;  Surgeon: Algernon Huxley, MD;  Location: [place] [facility];  Service: Cardiovascular;  Laterality:  N/A;    Social History: Social History   Tobacco Use   Smoking status: Never   Smokeless tobacco: Never  Vaping Use   Vaping Use: Never used  Substance Use Topics   Alcohol use: Not Currently    Comment: rarely   Drug use: Never   Additional social history:   Please also refer to relevant sections of EMR.  Family History: Family History  Problem Relation Age of Onset   CAD Mother    Diabetes Mellitus II Mother    Hypertension Mother    Uterine cancer Mother        dx late 17s   CAD Father    Heart attack Father    Hypertension Father    Diabetes Mellitus II Father        Took insulin   Alcohol abuse Father    Diabetes Mellitus II Sister    Cancer Sister        possible cervical or uterine   Lung cancer Paternal Aunt    Cancer  Paternal Uncle        unk type   Diabetes Other        Almost all sisters are diabetic or pre-diabetic    Cancer Cousin        possible colon/stomach/something in abdomen   Breast cancer Neg Hx     Allergies and Medications: Allergies  Allergen Reactions   Penicillins Hives and Swelling   Exenatide Nausea Only   Orlistat Diarrhea   Thiazide-Type Diuretics     Unknown reaction   No current facility-administered medications on file prior to encounter.   Current Outpatient Medications on File Prior to Encounter  Medication Sig Dispense Refill   anastrozole (ARIMIDEX) 1 MG tablet Take 1 tablet (1 mg total) by mouth daily. 90 tablet 1   CALCIUM PO Take 1,200 mg by mouth daily.     CALCIUM-MAGNESIUM-ZINC PO Take 1 tablet by mouth every evening.      clopidogrel (PLAVIX) 75 MG tablet Take 75 mg by mouth daily.      glimepiride (AMARYL) 4 MG tablet Take 4 mg by mouth 2 (two) times daily.      glucose blood (CONTOUR NEXT TEST) test strip Use once daily e11.65     JANUMET 50-500 MG tablet Take 1 tablet by mouth 2 (two) times daily.     JARDIANCE 25 MG TABS tablet Take 25 mg by mouth daily.      lansoprazole (PREVACID) 15 MG capsule Take 15 mg by mouth daily as needed (acid reflux).     lovastatin (MEVACOR) 40 MG tablet Take 40 mg by mouth every evening.     metFORMIN (GLUCOPHAGE) 500 MG tablet Take 500 mg by mouth 2 (two) times daily.      Multiple Vitamin (MULTIVITAMIN) tablet Take 1 tablet by mouth daily.     Vitamin D, Cholecalciferol, 25 MCG (1000 UT) TABS Take 1,000 mg by mouth daily.      Objective: BP 137/69    Pulse 72    Temp 98 F (36.7 C) (Oral)    Resp 19    Ht _0  (1.727 m)    Wt 78.9 kg    SpO2 99%    BMI 26.46 kg/m  Exam: General: alert, pleasant, NAD Eyes: EOMI. Normal  Neck: supple, normal ROM Cardiovascular: RRR no murmurs  Respiratory: CTAB normal WOB Gastrointestinal: soft, non distended. Tender to palpation of midepigastric region, RUQ and LLQ. No rebound  tenderness or guarding. Normal BS  MSK: normal strength and ROM Derm: warm, dry. No visible rashes or lesions  Neuro: alert and oriented. no focal deficits  Psych: mood and affect normal   Labs and Imaging: CBC BMET  Recent Labs  Lab 03/31/21 1252  WBC 5.5  HGB 13.4  HCT 40.9  PLT 184   Recent Labs  Lab 03/31/21 1228  NA 136  K 5.4*  CL 102  CO2 18*  BUN 13  CREATININE 0.95  GLUCOSE 151*  CALCIUM 9.2     EKG: NSR  DG Chest 2 View  Result Date: 03/31/2021 CLINICAL DATA:  Shortness of breath. EXAM: CHEST - 2 VIEW COMPARISON:  None. FINDINGS: Heart size and mediastinal contours are normal. No pleural effusion or edema identified. No airspace opacities. Visualized osseous structures are unremarkable. IMPRESSION: No active cardiopulmonary abnormalities. Electronically Signed   By: Kerby Moors M.D.   On: 03/31/2021 14:04   CT ABDOMEN PELVIS W CONTRAST  Result Date: 03/31/2021 CLINICAL DATA:  Abdominal pain. EXAM: CT ABDOMEN AND PELVIS WITH CONTRAST TECHNIQUE: Multidetector CT imaging of the abdomen and pelvis was performed using the standard protocol following bolus administration of intravenous contrast. RADIATION DOSE REDUCTION: This exam was performed according to the departmental dose-optimization program which includes automated exposure control, adjustment of the mA and/or kV according to patient size and/or use of iterative reconstruction technique. CONTRAST:  68m OMNIPAQUE IOHEXOL 300 MG/ML  SOLN COMPARISON:  03/02/2020 FINDINGS: Lower chest: Visualized lung bases are clear. Hepatobiliary: Extensive ill-defined low-attenuation lesions are identified throughout both lobes of compatible with metastatic disease. This is a new finding when compared with 03/02/2020. Index lesion within right lobe of liver measures 2.8 by 2.1 cm, image 16/3. Index lesion within segment 4 measures 2.4 x 2.0 cm, image 27/3. Index lesion within segment 2/3 measures 2.4 x 2.3 cm, image 25/3. Status  post cholecystectomy. No bile duct dilatation. Pancreas: Unremarkable. No pancreatic ductal dilatation or surrounding inflammatory changes. Spleen: Normal in size without focal abnormality. Adrenals/Urinary Tract: Normal adrenal glands. No kidney mass or hydronephrosis identified. No nephrolithiasis. Urinary bladder is unremarkable. Stomach/Bowel: Stomach appears within normal limits. No small bowel wall thickening, inflammation, or distension. No pathologic dilatation of the colon. No obstructing colonic mass identified. Vascular/Lymphatic: Aortic atherosclerosis. No aneurysm. No abdominopelvic adenopathy. Reproductive: Small exophytic calcified fibroid arises off the anterior uterus. Uterus is otherwise unremarkable. No adnexal mass. Other: No free fluid or fluid collections. No aggressive lytic or sclerotic bone lesions. Musculoskeletal: No acute or significant osseous findings. IMPRESSION: 1. No acute findings identified within the abdomen or pelvis. 2. Extensive ill-defined low-attenuation lesions throughout both lobes of liver compatible with metastatic disease. This is a new finding when compared with previous CT of the abdomen pelvis from 03/02/2020. No primary neoplasm identified within the abdomen or pelvis. 3. Aortic Atherosclerosis (ICD10-I70.0). Electronically Signed   By: TKerby MoorsM.D.   On: 03/31/2021 14:39     PShary Key DO 03/31/2021, 3:35 PM PGY-2, [place] pager: [phone] text pages welcome

## 2021-03-31 NOTE — Progress Notes (Signed)
FPTS Brief Progress Note  S: Patient seen at bedside for evening rounds and was sleeping comfortably. I did not wake her. Spoke with primary RN who does not have any concerns at this time.   O: BP 135/62 (BP Location: Right Arm)    Pulse 77    Temp 98.2 F (36.8 C) (Oral)    Resp 18    Ht 5\' 8"  (1.727 m)    Wt 78.9 kg    SpO2 96%    BMI 26.46 kg/m   Gen: resting comfortably, NAD Resp: normal work of breathing, equal chest rise noted  A/P: Concern for Liver Metastasis Plan for CT chest in the morning. Oncology to see at some point. Remainder of plan per H&P.  - Orders reviewed. Labs for AM ordered, which was adjusted as needed.    Alcus Dad, MD 03/31/2021, 10:30 PM PGY-2, [place] Night Resident  Please page [phone] with questions.

## 2021-03-31 NOTE — ED Provider Notes (Signed)
[facility] EMERGENCY DEPARTMENT Provider Note   CSN: [id number] Arrival date & time: 03/31/21  1202     History  No chief complaint on file.   Tammy Elliott is a 71 y.o. female this is a patient with a history of diabetes, GERD, coronary artery disease with a stent in the LAD, history of cholecystitis with previous cholecystectomy, history of superior mesenteric ischemia who underwent surgery to evaluate blockage, and was found to have only 25% blockage intraoperatively.  She presents with weakness, dizziness, shortness of breath, right upper quadrant/right flank pain as well as some right lower quadrant pain for the last 6 weeks.  Patient reports that she kept waiting for it to get better but it did not so came in for further evaluation at this time.  Patient denies chest pain, but does endorse shortness of breath with exertion.  She is taking Plavix as a blood thinner, she does report that some of her dizziness seemed worse after beginning Actos for her diabetes.  HPI     Home Medications Prior to Admission medications   Medication Sig Start Date End Date Taking? Authorizing Provider  anastrozole (ARIMIDEX) 1 MG tablet Take 1 tablet (1 mg total) by mouth daily. 01/30/21   Earlie Server, MD  CALCIUM PO Take 1,200 mg by mouth daily.    [provider]  CALCIUM-MAGNESIUM-ZINC PO Take 1 tablet by mouth every evening.     [provider]  clopidogrel (PLAVIX) 75 MG tablet Take 75 mg by mouth daily.  04/17/18   [provider]  glimepiride (AMARYL) 4 MG tablet Take 4 mg by mouth 2 (two) times daily.  04/17/18   [provider]  glucose blood (CONTOUR NEXT TEST) test strip Use once daily e11.65 04/07/20   [provider]  JANUMET 50-500 MG tablet Take 1 tablet by mouth 2 (two) times daily. 04/17/18   [provider]  JARDIANCE 25 MG TABS tablet Take 25 mg by mouth daily.  04/06/18   [provider]  lansoprazole (PREVACID)  15 MG capsule Take 15 mg by mouth daily as needed (acid reflux). 04/17/18   [provider]  lovastatin (MEVACOR) 40 MG tablet Take 40 mg by mouth every evening. 04/17/18   [provider]  metFORMIN (GLUCOPHAGE) 500 MG tablet Take 500 mg by mouth 2 (two) times daily.  04/17/18   [provider]  Multiple Vitamin (MULTIVITAMIN) tablet Take 1 tablet by mouth daily.    [provider]  Vitamin D, Cholecalciferol, 25 MCG (1000 UT) TABS Take 1,000 mg by mouth daily.    [provider]      Allergies    Penicillins, Exenatide, Orlistat, and Thiazide-type diuretics    Review of Systems   Review of Systems  Respiratory:  Positive for shortness of breath.   Gastrointestinal:  Positive for abdominal pain.  Neurological:  Positive for dizziness and weakness.  All other systems reviewed and are negative.  Physical Exam Updated Vital Signs BP 132/78    Pulse 76    Temp 98 F (36.7 C) (Oral)    Resp 20    Ht '5\' 8"'  (1.727 m)    Wt 78.9 kg    SpO2 99%    BMI 26.46 kg/m  Physical Exam Vitals and nursing note reviewed.  Constitutional:      General: She is not in acute distress.    Appearance: Normal appearance.  HENT:     Head: Normocephalic and atraumatic.  Eyes:     General:        Right eye: No discharge.        Left eye: No discharge.  Cardiovascular:     Rate and Rhythm: Normal rate and regular rhythm.     Heart sounds: No murmur heard.   No friction rub. No gallop.  Pulmonary:     Effort: Pulmonary effort is normal.     Breath sounds: Normal breath sounds.  Abdominal:     General: Bowel sounds are normal.     Palpations: Abdomen is soft.     Comments: Tenderness to palpation most focally in right upper quadrant, right lower quadrant, and epigastric region, no tenderness palpation left upper quadrant left lower quadrant.  No suprapubic tenderness to palpation.  No rebound, rigidity, guarding.  Normal bowel sounds throughout.  Skin:    General:  Skin is warm and dry.     Capillary Refill: Capillary refill takes less than 2 seconds.  Neurological:     Mental Status: She is alert and oriented to person, place, and time.  Psychiatric:        Mood and Affect: Mood normal.        Behavior: Behavior normal.    ED Results / Procedures / Treatments   Labs (all labs ordered are listed, but only abnormal results are displayed) Labs Reviewed  URINALYSIS, ROUTINE W REFLEX MICROSCOPIC - Abnormal; Notable for the following components:      Result Value   Glucose, UA >=500 (*)    All other components within normal limits  COMPREHENSIVE METABOLIC PANEL - Abnormal; Notable for the following components:   Potassium 5.4 (*)    CO2 18 (*)    Glucose, Bld 151 (*)    Albumin 3.4 (*)    AST 257 (*)    ALT 136 (*)    Alkaline Phosphatase 259 (*)    Total Bilirubin 2.5 (*)    Anion gap 16 (*)    All other components within normal limits  LIPASE, BLOOD - Abnormal; Notable for the following components:   Lipase 67 (*)    All other components within normal limits  RESP PANEL BY RT-PCR (FLU A&B, COVID) ARPGX2  CBC  BRAIN NATRIURETIC PEPTIDE  TROPONIN I (HIGH SENSITIVITY)  TROPONIN I (HIGH SENSITIVITY)    EKG None  Radiology DG Chest 2 View  Result Date: 03/31/2021 CLINICAL DATA:  Shortness of breath. EXAM: CHEST - 2 VIEW COMPARISON:  None. FINDINGS: Heart size and mediastinal contours are normal. No pleural effusion or edema identified. No airspace opacities. Visualized osseous structures are unremarkable. IMPRESSION: No active cardiopulmonary abnormalities. Electronically Signed   By: Kerby Moors M.D.   On: 03/31/2021 14:04   CT ABDOMEN PELVIS W CONTRAST  Result Date: 03/31/2021 CLINICAL DATA:  Abdominal pain. EXAM: CT ABDOMEN AND PELVIS WITH CONTRAST TECHNIQUE: Multidetector CT imaging of the abdomen and pelvis was performed using the standard protocol following bolus administration of intravenous contrast. RADIATION DOSE REDUCTION:  This exam was performed according to the departmental dose-optimization program which includes automated exposure control, adjustment of the mA and/or kV according to patient size and/or use of iterative reconstruction technique. CONTRAST:  58m OMNIPAQUE IOHEXOL 300 MG/ML  SOLN COMPARISON:  03/02/2020 FINDINGS: Lower chest: Visualized lung bases are clear. Hepatobiliary: Extensive ill-defined low-attenuation lesions are identified throughout both lobes of compatible with metastatic disease. This is a new finding when compared with 03/02/2020. Index lesion within right lobe of liver measures 2.8 by 2.1 cm,  image 16/3. Index lesion within segment 4 measures 2.4 x 2.0 cm, image 27/3. Index lesion within segment 2/3 measures 2.4 x 2.3 cm, image 25/3. Status post cholecystectomy. No bile duct dilatation. Pancreas: Unremarkable. No pancreatic ductal dilatation or surrounding inflammatory changes. Spleen: Normal in size without focal abnormality. Adrenals/Urinary Tract: Normal adrenal glands. No kidney mass or hydronephrosis identified. No nephrolithiasis. Urinary bladder is unremarkable. Stomach/Bowel: Stomach appears within normal limits. No small bowel wall thickening, inflammation, or distension. No pathologic dilatation of the colon. No obstructing colonic mass identified. Vascular/Lymphatic: Aortic atherosclerosis. No aneurysm. No abdominopelvic adenopathy. Reproductive: Small exophytic calcified fibroid arises off the anterior uterus. Uterus is otherwise unremarkable. No adnexal mass. Other: No free fluid or fluid collections. No aggressive lytic or sclerotic bone lesions. Musculoskeletal: No acute or significant osseous findings. IMPRESSION: 1. No acute findings identified within the abdomen or pelvis. 2. Extensive ill-defined low-attenuation lesions throughout both lobes of liver compatible with metastatic disease. This is a new finding when compared with previous CT of the abdomen pelvis from 03/02/2020. No  primary neoplasm identified within the abdomen or pelvis. 3. Aortic Atherosclerosis (ICD10-I70.0). Electronically Signed   By: Kerby Moors M.D.   On: 03/31/2021 14:39    Procedures Procedures    Medications Ordered in ED Medications  iohexol (OMNIPAQUE) 300 MG/ML solution 75 mL (75 mLs Intravenous Contrast Given 03/31/21 1420)    ED Course/ Medical Decision Making/ A&P Clinical Course as of 03/31/21 1615  Sat Mar 31, 2021  1552 [place] Hem/Onc to consult -- will obtain CT chest with contrast in 24 hours to further assess for additional metastasis, additional plan to biopsy liver Monday for prognostication. After will return to primary oncologist Dr. Tasia Catchings. [CP]    Clinical Course User Index [CP] Anselmo Pickler, PA-C                           Medical Decision Making Amount and/or Complexity of Data Reviewed Labs: ordered. Radiology: ordered.  Risk Prescription drug management.   This patient presents to the ED for concern of weakness, dizziness, abdominal pain, this involves an extensive number of treatment options, and is a complaint that carries with it a high risk of complications and morbidity. The emergent differential diagnosis includes, but is not limited to, appendicitis, pancreatitis, gastroenteritis, gastritis, acute mesenteric ischemia, ACS, pneumonia, stroke, electrolyte abnormality, anemia, nephrolithiasis, pyelonephritis, UTI.  This is not an exhaustive differential.   Co morbidities that complicate the patient evaluation: History of breast cancer without radiation, or chemotherapy, clear lymph nodes.  Coronary artery disease, diabetes, acid reflux, hyperlipidemia.,  History of superior mesenteric artery stenosis. Social Determinants of Health: Patient with good health literacy, good consistent follow-up  External records from outside source obtained and reviewed including previous admission notes, outpatient surgery notes, outpatient endocrinology and PCP  notes..  Physical Exam: Physical exam performed. The pertinent findings include: Tenderness to palpation right upper and lower quadrants, epigastric region.  Otherwise unremarkable physical exam.  Lab Tests: I Ordered, and personally interpreted labs.  The pertinent results include: Mildly elevated lipase of 67.  Elevated potassium at 5.4, no EKG changes.  Patient with slightly low bicarb, with anion gap.  Slightly elevated hyperglycemia of 151.  Significantly elevated liver enzymes, AST 257, ALT 136, alk phos 259, T bili 2.5.  Urinalysis sniffing and for glucose urea, no other abnormalities.  This is normal in context of diabetes medications.  Initial troponin negative, BNP negative.  RVP pending.  Imaging Studies: I ordered imaging studies including chest x-ray, CT abdomen pelvis. I independently visualized and interpreted imaging which showed focal lesions infiltrating the liver consistent with metastatic cancer, there is no biliary duct dilation noted.  There are no other intra-abdominal abnormalities noted.. I agree with the radiologist interpretation.   Cardiac Monitoring:  The patient was maintained on a cardiac monitor.  My attending physician Dr. Alvino Chapel viewed and interpreted the cardiac monitored which showed an underlying rhythm of: Sinus rhythm  Consultations Obtained: I requested consultation with the hem/onc team,  and discussed lab and imaging findings as well as pertinent plan - they recommend: They agree with her plan to admit patient for additional CT contrast imaging to evaluate for further metastases, as well as plan for biopsy of the liver on Monday for prognostication.  After that we will return to primary oncologist.  Additionally consulted for admission at this time, pending this call at time of handoff.   Disposition: After consideration of the diagnostic results and the patients response to treatment, I feel that with patient's progressive weakness, unexplained  shortness of breath with exertion, and new metastatic lesions of the liver she would benefit from admission for additional prognostication, further imaging, and liver biopsy on Monday.   4:15 PM Care of Tammy Elliott transferred to [facility] and Dr. Tamera Punt at the end of my shift as the patient will require reassessment once labs/imaging have resulted. Patient presentation, ED course, and plan of care discussed with review of all pertinent labs and imaging. Please see his/her note for further details regarding further ED course and disposition. Plan at time of handoff is admit. This may be altered or completely changed at the discretion of the oncoming team pending results of further workup.   Final Clinical Impression(s) / ED Diagnoses Final diagnoses:  None    Rx / DC Orders ED Discharge Orders     None         Dorien Chihuahua 03/31/21 1615    Davonna Belling, MD 04/01/21 [phone]

## 2021-03-31 NOTE — ED Provider Notes (Signed)
Care assumed from [place] at shift change, please see his note for full details, but in brief Tammy Elliott is a 71 y.o. female who presented with abdominal pain and generalized weakness.  Found to have new liver metastasis as well as elevated liver enzymes and bili.  Oncology was consulted and agrees with plan for admission to medicine team, recommends that patient have a chest CT tomorrow since she is already had a contrast dose today to assess for other areas of metastasis.  They will try and arrange for liver biopsy likely on Monday.  I requested consultation for admission, discussed lab and imaging findings as well as pertinent plan with family medicine teaching service and they will see and admit the patient.   Jacqlyn Larsen, PA-C 03/31/21 1709    Malvin Johns, MD 03/31/21 2318

## 2021-03-31 NOTE — ED Notes (Signed)
Walked patient to the bathroom patient did well 

## 2021-03-31 NOTE — Plan of Care (Signed)

## 2021-04-01 ENCOUNTER — Inpatient Hospital Stay (HOSPITAL_COMMUNITY): Payer: Medicare Other

## 2021-04-01 DIAGNOSIS — R7401 Elevation of levels of liver transaminase levels: Secondary | ICD-10-CM

## 2021-04-01 LAB — COMPREHENSIVE METABOLIC PANEL
ALT: 125 U/L — ABNORMAL HIGH (ref 0–44)
AST: 216 U/L — ABNORMAL HIGH (ref 15–41)
Albumin: 3.1 g/dL — ABNORMAL LOW (ref 3.5–5.0)
Alkaline Phosphatase: 252 U/L — ABNORMAL HIGH (ref 38–126)
Anion gap: 11 (ref 5–15)
BUN: 13 mg/dL (ref 8–23)
CO2: 23 mmol/L (ref 22–32)
Calcium: 8.7 mg/dL — ABNORMAL LOW (ref 8.9–10.3)
Chloride: 103 mmol/L (ref 98–111)
Creatinine, Ser: 0.74 mg/dL (ref 0.44–1.00)
GFR, Estimated: >60 mL/min (ref >60)
Glucose, Bld: 103 mg/dL — ABNORMAL HIGH (ref 70–99)
Potassium: 3.9 mmol/L (ref 3.5–5.1)
Sodium: 137 mmol/L (ref 135–145)
Total Bilirubin: 1.2 mg/dL (ref 0.3–1.2)
Total Protein: 6.8 g/dL (ref 6.5–8.1)

## 2021-04-01 LAB — CBC
HCT: 38.1 % (ref 36.0–46.0)
Hemoglobin: 12.7 g/dL (ref 12.0–15.0)
MCH: 31.1 pg (ref 26.0–34.0)
MCHC: 33.3 g/dL (ref 30.0–36.0)
MCV: 93.4 fL (ref 80.0–100.0)
Platelets: 186 10*3/uL (ref 150–400)
RBC: 4.08 MIL/uL (ref 3.87–5.11)
RDW: 13.7 % (ref 11.5–15.5)
WBC: 5.4 10*3/uL (ref 4.0–10.5)
nRBC: 0 % (ref 0.0–0.2)

## 2021-04-01 LAB — HEPATITIS PANEL, ACUTE
HCV Ab: NONREACTIVE
Hep A IgM: NONREACTIVE
Hep B C IgM: NONREACTIVE
Hepatitis B Surface Ag: NONREACTIVE

## 2021-04-01 LAB — GLUCOSE, CAPILLARY
Glucose-Capillary: 195 mg/dL — ABNORMAL HIGH (ref 70–99)
Glucose-Capillary: 266 mg/dL — ABNORMAL HIGH (ref 70–99)
Glucose-Capillary: 162 mg/dL — ABNORMAL HIGH (ref 70–99)
Glucose-Capillary: 194 mg/dL — ABNORMAL HIGH (ref 70–99)

## 2021-04-01 LAB — HIV ANTIBODY (ROUTINE TESTING W REFLEX): HIV Screen 4th Generation wRfx: NONREACTIVE

## 2021-04-01 LAB — PROTIME-INR
INR: 1 (ref 0.8–1.2)
Prothrombin Time: 13.4 seconds (ref 11.4–15.2)

## 2021-04-01 LAB — APTT: aPTT: 30 seconds (ref 24–36)

## 2021-04-01 MED ORDER — HEPARIN SODIUM (PORCINE) 5000 UNIT/ML IJ SOLN: 5000.0000 [IU] | Freq: Two times a day (BID) | INTRAMUSCULAR | Status: DC

## 2021-04-01 MED ORDER — IOHEXOL 350 MG/ML SOLN
100.0000 mL | Freq: Once | INTRAVENOUS | Status: AC | PRN
  Administered 2021-04-01: 100 mL via INTRAVENOUS

## 2021-04-01 MED ORDER — HEPARIN SODIUM (PORCINE) 5000 UNIT/ML IJ SOLN
5000.0000 [IU] | Freq: Two times a day (BID) | INTRAMUSCULAR | Status: AC
  Administered 2021-04-01: 5000 [IU] via SUBCUTANEOUS
  Filled 2021-04-01: qty 1

## 2021-04-01 NOTE — TOC Progression Note (Signed)
Transition of Care [facility]) - Progression Note    Patient Details  Name: Tammy Elliott MRN: [id number] Date of Birth: December 30, 1950  Transition of Care [facility]) CM/SW Contact  Zenon Mayo, RN Phone Number: 04/01/2021, 2:17 PM  Clinical Narrative:     Transition of Care [facility]) Screening Note   Patient Details  Name: Tammy Elliott Date of Birth: 24-Jun-1950   Transition of Care [facility]) CM/SW Contact:    Zenon Mayo, RN Phone Number: 04/01/2021, 2:17 PM    Transition of Care Department [facility]) has reviewed patient and no TOC needs have been identified at this time. We will continue to monitor patient advancement through interdisciplinary progression rounds. If new patient transition needs arise, please place a TOC consult.          Expected Discharge Plan and Services                                                 Social Determinants of Health (SDOH) Interventions    Readmission Risk Interventions No flowsheet data found.

## 2021-04-01 NOTE — Consult Note (Signed)
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  Oncology history  Patient had screening mammogram done on 02/16/2019 which showed left breast asymmetry with distortion in the upper outer quadrant.  Left diagnostic mammogram showed small asymmetry which appeared to be a small ill-defined mass with associated subtle distortion at 1:30 position of the left breast, 7 cm from the nipple.  Ultrasound of the left axilla showed no enlarged or abnormal lymph nodes. Patient underwent stereotactic core needle biopsy of the mass. 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  Grade 2, ER > 90%, PR 1 to 10%, HER-2 negative. She is on adjuvant Arimidex.  Interval history Patient has been faithfully taking her Arimidex as prescribed, has been doing really well until the past few weeks.  She has noticed some increasing right upper quadrant abdominal pain, rates it at about 5 out of 10 in intensity as well as progressive fatigue for the past several weeks.  Hence she came to the ER.  She had CT abdomen pelvis in the ER which showed no acute findings, extensive ill-defined low-attenuation lesions throughout both lobes of liver compatible with metastatic disease, new finding when compared to previous CT in December 2021.  CT chest showed no evidence of primary lung neoplasm or metastatic disease to the chest, status post left mastectomy with implant reconstruction  mild skin thickening with subcutaneous soft tissue stranding within periphery of reconstructed left breast, nonspecific changes, multifocal liver mets.  She is a very pleasant lady, was hoping to go home after liver biopsy and follow-up with Dr. Tasia Catchings.  Besides the above-mentioned complaints she denied any unusual bone pains, change in bowel habits or urinary habits or new neurological complaints.  Rest of the pertinent 10 point ROS reviewed and negative.  Past Medical History:  Diagnosis Date   Aortic atherosclerosis ([place])    CAD (coronary artery disease) 04/12/2010   a.) PCI --> 85% pLAD --> 2.5 x 15 mm Xience V DES x 1   Chronic anticoagulation    Rivaroxaban   Chronic ischemic heart disease    DOE (dyspnea on exertion)    Family history of uterine cancer    GERD (gastroesophageal reflux disease)    Hypercholesterolemia    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 [facility]) 03/11/2019   a.) Stage IA (cT1b, cN0, cM0, G2, ER+, PR+, HER2-); b.) adjuvant endocrine therapy started 06/2019; c.) s/p total mastectomy, SNLB, and breast reconstruction in 04/2019   Skin cancer, basal cell    s/p excision   Superior mesenteric artery stenosis (HCC)    T2DM (type 2 diabetes mellitus) ([place])   :   Past Surgical History:  Procedure Laterality Date   BREAST BIOPSY Left 03/11/2019   Stereotactic CNB; pathology --> ER/PR (-), HER2/neu (-) IMC iwth lobular features   BREAST RECONSTRUCTION WITH PLACEMENT OF TISSUE EXPANDER AND FLEX HD (ACELLULAR HYDRATED DERMIS) Left 05/03/2019   Procedure: LEFT BREAST RECONSTRUCTION WITH PLACEMENT OF TISSUE EXPANDER AND FLEX HD (ACELLULAR HYDRATED DERMIS);  Surgeon:  Dillingham, Loel Lofty, DO;  Location: [facility];  Service: Plastics;  Laterality: Left;   CHOLECYSTECTOMY     COLONOSCOPY     CORONARY ANGIOPLASTY WITH STENT PLACEMENT Left 04/12/2010   Procedure: LHC with placement of 2.5 x 15 mm Xience V DES x 1 to pLAD; Location: [place]; Surgeon: Isaias Cowman,  MD   EXCISION OF BREAST BIOPSY Left 11/17/2020   Procedure: EXCISION OF BREAST BIOPSY;  Surgeon: Herbert Pun, MD;  Location: [facility];  Service: General;  Laterality: Left;   LIPOSUCTION WITH LIPOFILLING Bilateral 01/27/2020   Procedure: LIPOSUCTION WITH LIPOFILLING;  Surgeon: Wallace Going, DO;  Location: [place];  Service: Plastics;  Laterality: Bilateral;  90 min   MASTOPEXY Right 07/01/2019   Procedure: MASTOPEXY;  Surgeon: Wallace Going, DO;  Location: [place];  Service: Plastics;  Laterality: Right;   REMOVAL OF TISSUE EXPANDER AND PLACEMENT OF IMPLANT Left 07/01/2019   Procedure: REMOVAL OF TISSUE EXPANDER AND PLACEMENT OF IMPLANT;  Surgeon: Wallace Going, DO;  Location: [place];  Service: Plastics;  Laterality: Left;  total case 2.5 hours   TOTAL MASTECTOMY Left 05/03/2019   Procedure: TOTAL MASTECTOMY W/ Sentinel Node;  Surgeon: Herbert Pun, MD;  Location: [facility];  Service: General;  Laterality: Left;   VISCERAL ANGIOGRAPHY N/A 05/01/2020   Procedure: VISCERAL ANGIOGRAPHY;  Surgeon: Algernon Huxley, MD;  Location: [place] [facility];  Service: Cardiovascular;  Laterality: N/A;  :   Current Facility-Administered Medications  Medication Dose Route Frequency Provider Last Rate Last Admin   acetaminophen (TYLENOL) tablet 650 mg  650 mg Oral Q8H PRN Sharion Settler, DO       Or   acetaminophen (TYLENOL) suppository 650 mg  650 mg Rectal Q6H PRN Sharion Settler, DO       clopidogrel (PLAVIX) tablet 75 mg  75 mg Oral Daily Paige, Victoria J, DO   75 mg at 04/01/21 1018   empagliflozin (JARDIANCE) tablet 25 mg  25 mg Oral Daily Shary Key, DO   25 mg at 04/01/21 1121   heparin injection 5,000 Units  5,000 Units Subcutaneous Q12H Autry-Lott, Simone, DO       [START ON 04/02/2021] metFORMIN (GLUCOPHAGE) tablet 500 mg  500 mg Oral BID Paige, Victoria J, DO       pravastatin (PRAVACHOL)  tablet 40 mg  40 mg Oral q1800 Paige, Victoria J, DO   40 mg at 03/31/21 1948      Allergies  Allergen Reactions   Penicillins Hives and Swelling   Exenatide Nausea Only   Orlistat Diarrhea   Thiazide-Type Diuretics     Unknown reaction  :   Family History  Problem Relation Age of Onset   CAD Mother    Diabetes Mellitus II Mother    Hypertension Mother    Uterine cancer Mother        dx late 40s   CAD Father    Heart attack Father    Hypertension Father    Diabetes Mellitus II Father        Took insulin   Alcohol abuse Father    Diabetes Mellitus II Sister    Cancer Sister        possible cervical or uterine   Lung cancer Paternal Aunt    Cancer Paternal Uncle        unk type   Diabetes Other        Almost all sisters are diabetic or pre-diabetic  Cancer Cousin        possible colon/stomach/something in abdomen   Breast cancer Neg Hx   :   Social History   Socioeconomic History   Marital status: Married    Spouse name: Not on file   Number of children: Not on file   Years of education: Not on file   Highest education level: Not on file  Occupational History   Occupation: Analyst    Employer: [employer]  Tobacco Use   Smoking status: Never   Smokeless tobacco: Never  Vaping Use   Vaping Use: Never used  Substance and Sexual Activity   Alcohol use: Not Currently    Comment: rarely   Drug use: Never   Sexual activity: Not on file  Other Topics Concern   Not on file  Social History Narrative   reports that she has never smoked. She has never used smokeless tobacco. She reports that she rarely drinks alcohol. She reports that she does not use drugs.      Married to [name] ([name]) [name]    Social Determinants of Health   Financial Resource Strain: Not on file  Food Insecurity: Not on file  Transportation Needs: Not on file  Physical Activity: Not on file  Stress: Not on file  Social Connections: Not on file  Intimate Partner Violence: Not on  file  :  Pertinent items are noted in HPI.  Exam: Patient Vitals for the past 24 hrs:  BP Temp Temp src Pulse Resp SpO2 Height Weight  04/01/21 1129 128/70 98 F (36.7 C) Oral 68 16 97 % -- --  04/01/21 0821 127/63 98.4 F (36.9 C) Oral 75 16 95 % -- --  04/01/21 0419 129/69 97.8 F (36.6 C) -- 74 15 97 % -- --  04/01/21 0108 (!) 121/58 97.8 F (36.6 C) -- 73 16 97 % -- --  03/31/21 2037 135/62 98.2 F (36.8 C) Oral 77 18 96 % -- --  03/31/21 1855 (!) 132/100 98.1 F (36.7 C) Oral 83 18 99 % -- --  03/31/21 1645 128/62 -- -- 73 17 100 % -- --  03/31/21 1545 132/78 -- -- 76 20 99 % -- --  03/31/21 1435 137/69 -- -- 72 19 99 % -- --  03/31/21 1216 129/72 98 F (36.7 C) Oral 71 18 98 % _0  (1.727 m) 174 lb (78.9 kg)    General: Alert oriented, no acute distress HEENT: Atraumatic, normocephalic Chest: Clear to auscultation bilaterally Abdomen: Soft, mild tenderness in the lateral bowel region, no guarding, rigidity or rebound tenderness.  No organomegaly. Extremities no lower extremity edema,    Lab Results  Component Value Date   WBC 5.4 04/01/2021   HGB 12.7 04/01/2021   HCT 38.1 04/01/2021   PLT 186 04/01/2021   GLUCOSE 103 (H) 04/01/2021   ALT 125 (H) 04/01/2021   AST 216 (H) 04/01/2021   NA 137 04/01/2021   K 3.9 04/01/2021   CL 103 04/01/2021   CREATININE 0.74 04/01/2021   BUN 13 04/01/2021   CO2 23 04/01/2021    DG Chest 2 View  Result Date: 03/31/2021 CLINICAL DATA:  Shortness of breath. EXAM: CHEST - 2 VIEW COMPARISON:  None. FINDINGS: Heart size and mediastinal contours are normal. No pleural effusion or edema identified. No airspace opacities. Visualized osseous structures are unremarkable. IMPRESSION: No active cardiopulmonary abnormalities. Electronically Signed   By: Kerby Moors M.D.   On: 03/31/2021 14:04   CT CHEST W CONTRAST  Result Date: 04/01/2021 CLINICAL DATA:  Evaluate for malignancy. Newly discovered liver metastases. EXAM: CT CHEST  WITH CONTRAST TECHNIQUE: Multidetector CT imaging of the chest was performed during intravenous contrast administration. RADIATION DOSE REDUCTION: This exam was performed according to the departmental dose-optimization program which includes automated exposure control, adjustment of the mA and/or kV according to patient size and/or use of iterative reconstruction technique. CONTRAST:  172m OMNIPAQUE IOHEXOL 350 MG/ML SOLN COMPARISON:  CT AP 03/31/2021. FINDINGS: Cardiovascular: No significant vascular findings. Normal heart size. No pericardial effusion. Aortic atherosclerosis and coronary artery calcifications. Mediastinum/Nodes: No enlarged mediastinal, hilar, or axillary lymph nodes. Thyroid gland, trachea, and esophagus demonstrate no significant findings. Lungs/Pleura: Scar like density identified within the superior segment of right lower lobe. No pleural effusion, airspace consolidation, or pneumothorax. No suspicious pulmonary nodule or mass identified. Dependent changes are noted within the posterior lung bases. Upper Abdomen: Multifocal liver metastases are again identified involving both lobes of liver. No acute abnormality noted within the imaged portions of the upper abdomen. Musculoskeletal: Status post left mastectomy with implant reconstruction. There is mild skin thickening with subcutaneous soft tissue stranding within the periphery of the reconstructed left breast, image 90/3. IMPRESSION: 1. No evidence for primary lung neoplasm or metastatic disease to the chest. 2. Status post left mastectomy with implant reconstruction. There is mild skin thickening with subcutaneous soft tissue stranding within the periphery of the reconstructed left breast. This is nonspecific and may be related to postsurgical change. 3. Multifocal liver metastases. 4. Coronary artery calcifications. 5. Aortic Atherosclerosis (ICD10-I70.0). Electronically Signed   By: TKerby MoorsM.D.   On: 04/01/2021 11:42   CT  ABDOMEN PELVIS W CONTRAST  Result Date: 03/31/2021 CLINICAL DATA:  Abdominal pain. EXAM: CT ABDOMEN AND PELVIS WITH CONTRAST TECHNIQUE: Multidetector CT imaging of the abdomen and pelvis was performed using the standard protocol following bolus administration of intravenous contrast. RADIATION DOSE REDUCTION: This exam was performed according to the departmental dose-optimization program which includes automated exposure control, adjustment of the mA and/or kV according to patient size and/or use of iterative reconstruction technique. CONTRAST:  769mOMNIPAQUE IOHEXOL 300 MG/ML  SOLN COMPARISON:  03/02/2020 FINDINGS: Lower chest: Visualized lung bases are clear. Hepatobiliary: Extensive ill-defined low-attenuation lesions are identified throughout both lobes of compatible with metastatic disease. This is a new finding when compared with 03/02/2020. Index lesion within right lobe of liver measures 2.8 by 2.1 cm, image 16/3. Index lesion within segment 4 measures 2.4 x 2.0 cm, image 27/3. Index lesion within segment 2/3 measures 2.4 x 2.3 cm, image 25/3. Status post cholecystectomy. No bile duct dilatation. Pancreas: Unremarkable. No pancreatic ductal dilatation or surrounding inflammatory changes. Spleen: Normal in size without focal abnormality. Adrenals/Urinary Tract: Normal adrenal glands. No kidney mass or hydronephrosis identified. No nephrolithiasis. Urinary bladder is unremarkable. Stomach/Bowel: Stomach appears within normal limits. No small bowel wall thickening, inflammation, or distension. No pathologic dilatation of the colon. No obstructing colonic mass identified. Vascular/Lymphatic: Aortic atherosclerosis. No aneurysm. No abdominopelvic adenopathy. Reproductive: Small exophytic calcified fibroid arises off the anterior uterus. Uterus is otherwise unremarkable. No adnexal mass. Other: No free fluid or fluid collections. No aggressive lytic or sclerotic bone lesions. Musculoskeletal: No acute or  significant osseous findings. IMPRESSION: 1. No acute findings identified within the abdomen or pelvis. 2. Extensive ill-defined low-attenuation lesions throughout both lobes of liver compatible with metastatic disease. This is a new finding when compared with previous CT of the abdomen pelvis from 03/02/2020. No primary neoplasm identified within  the abdomen or pelvis. 3. Aortic Atherosclerosis (ICD10-I70.0). Electronically Signed   By: Kerby Moors M.D.   On: 03/31/2021 14:39    Pathology: Pending liver biopsy  DG Chest 2 View  Result Date: 03/31/2021 CLINICAL DATA:  Shortness of breath. EXAM: CHEST - 2 VIEW COMPARISON:  None. FINDINGS: Heart size and mediastinal contours are normal. No pleural effusion or edema identified. No airspace opacities. Visualized osseous structures are unremarkable. IMPRESSION: No active cardiopulmonary abnormalities. Electronically Signed   By: Kerby Moors M.D.   On: 03/31/2021 14:04   CT CHEST W CONTRAST  Result Date: 04/01/2021 CLINICAL DATA:  Evaluate for malignancy. Newly discovered liver metastases. EXAM: CT CHEST WITH CONTRAST TECHNIQUE: Multidetector CT imaging of the chest was performed during intravenous contrast administration. RADIATION DOSE REDUCTION: This exam was performed according to the departmental dose-optimization program which includes automated exposure control, adjustment of the mA and/or kV according to patient size and/or use of iterative reconstruction technique. CONTRAST:  156m OMNIPAQUE IOHEXOL 350 MG/ML SOLN COMPARISON:  CT AP 03/31/2021. FINDINGS: Cardiovascular: No significant vascular findings. Normal heart size. No pericardial effusion. Aortic atherosclerosis and coronary artery calcifications. Mediastinum/Nodes: No enlarged mediastinal, hilar, or axillary lymph nodes. Thyroid gland, trachea, and esophagus demonstrate no significant findings. Lungs/Pleura: Scar like density identified within the superior segment of right lower lobe. No  pleural effusion, airspace consolidation, or pneumothorax. No suspicious pulmonary nodule or mass identified. Dependent changes are noted within the posterior lung bases. Upper Abdomen: Multifocal liver metastases are again identified involving both lobes of liver. No acute abnormality noted within the imaged portions of the upper abdomen. Musculoskeletal: Status post left mastectomy with implant reconstruction. There is mild skin thickening with subcutaneous soft tissue stranding within the periphery of the reconstructed left breast, image 90/3. IMPRESSION: 1. No evidence for primary lung neoplasm or metastatic disease to the chest. 2. Status post left mastectomy with implant reconstruction. There is mild skin thickening with subcutaneous soft tissue stranding within the periphery of the reconstructed left breast. This is nonspecific and may be related to postsurgical change. 3. Multifocal liver metastases. 4. Coronary artery calcifications. 5. Aortic Atherosclerosis (ICD10-I70.0). Electronically Signed   By: TKerby MoorsM.D.   On: 04/01/2021 11:42   CT ABDOMEN PELVIS W CONTRAST  Result Date: 03/31/2021 CLINICAL DATA:  Abdominal pain. EXAM: CT ABDOMEN AND PELVIS WITH CONTRAST TECHNIQUE: Multidetector CT imaging of the abdomen and pelvis was performed using the standard protocol following bolus administration of intravenous contrast. RADIATION DOSE REDUCTION: This exam was performed according to the departmental dose-optimization program which includes automated exposure control, adjustment of the mA and/or kV according to patient size and/or use of iterative reconstruction technique. CONTRAST:  757mOMNIPAQUE IOHEXOL 300 MG/ML  SOLN COMPARISON:  03/02/2020 FINDINGS: Lower chest: Visualized lung bases are clear. Hepatobiliary: Extensive ill-defined low-attenuation lesions are identified throughout both lobes of compatible with metastatic disease. This is a new finding when compared with 03/02/2020. Index  lesion within right lobe of liver measures 2.8 by 2.1 cm, image 16/3. Index lesion within segment 4 measures 2.4 x 2.0 cm, image 27/3. Index lesion within segment 2/3 measures 2.4 x 2.3 cm, image 25/3. Status post cholecystectomy. No bile duct dilatation. Pancreas: Unremarkable. No pancreatic ductal dilatation or surrounding inflammatory changes. Spleen: Normal in size without focal abnormality. Adrenals/Urinary Tract: Normal adrenal glands. No kidney mass or hydronephrosis identified. No nephrolithiasis. Urinary bladder is unremarkable. Stomach/Bowel: Stomach appears within normal limits. No small bowel wall thickening, inflammation, or distension. No pathologic dilatation of  the colon. No obstructing colonic mass identified. Vascular/Lymphatic: Aortic atherosclerosis. No aneurysm. No abdominopelvic adenopathy. Reproductive: Small exophytic calcified fibroid arises off the anterior uterus. Uterus is otherwise unremarkable. No adnexal mass. Other: No free fluid or fluid collections. No aggressive lytic or sclerotic bone lesions. Musculoskeletal: No acute or significant osseous findings. IMPRESSION: 1. No acute findings identified within the abdomen or pelvis. 2. Extensive ill-defined low-attenuation lesions throughout both lobes of liver compatible with metastatic disease. This is a new finding when compared with previous CT of the abdomen pelvis from 03/02/2020. No primary neoplasm identified within the abdomen or pelvis. 3. Aortic Atherosclerosis (ICD10-I70.0). Electronically Signed   By: Kerby Moors M.D.   On: 03/31/2021 14:39    Assessment and Plan:   This is a very pleasant 71 year old female patient with past medical history significant for stage I breast cancer of the left breast status post left mastectomy, on adjuvant Arimidex who presented with chief complaint of right upper quadrant abdominal pain and generalized fatigue for the past 2 to 4 weeks.  She has been taking Arimidex as prescribed and  denies any side effects with this.  She was last seen by her oncologist 4 months ago had labs which showed no evidence of hepatic compromise.  When she arrived to the hospital she had CT abdomen pelvis as well as a chest which showed multifocal liver metastasis, no other obvious metastatic sites. I would recommend liver biopsy to know the prognostics and to confirm the metastatic disease from breast. She does not have any ongoing GI complaints to suggest other primary malignancy at this time.  Again recommendations are pending biopsy results.  We have briefly discussed that if this were indeed breast cancer, this is an advanced breast cancer and the treatment is not curative but there are excellent options for treatment based on the hormone receptor positivity. She expressed understanding of the recommendations.  She would like to proceed with biopsy and be discharged and follow-up with Dr. Tasia Catchings for further recommendations.  I think this is a very reasonable plan.  She already has a scheduled follow-up but I will also send an in basket message to Dr. Tasia Catchings for sooner follow-up to discuss pathology results and treatment recommendations.  The length of time of the face-to-face encounter was 70 minutes. More than 50% of time was spent counseling and coordination of care.   Thank you for this referral.

## 2021-04-01 NOTE — Progress Notes (Addendum)
IR procedure request Liver Biopsy.   71 y.o. female inpatient. History of DM, GERD, CAD s/p PCI (on plavix), mesentic ischemia, invasive lobular carcinoma of the left breast status post mastectomy . Presented [facility] ED at [facility] on 1[facility]21.23 with weakness, dizziness, SHOB with RUQ pain.  CT abd pelvis reads Extensive ill-defined low-attenuation lesions throughout both lobes of liver compatible with metastatic disease. This is a new finding when compared with previous CT of the abdomen pelvis from 03/02/2020. No primary neoplasm identified within the abdomen or pelvis. Team is requesting a liver lesion biopsy for further evaluation Akaline phosphatase 252, Albumin 3.1, AST 216, ALT 125.Last dose of plavix given on 1.22.23 @ 10:18. All other labs and medications are within acceptable parameters. Allergies include PCN.  IR consulted for possible liver biopsy. Case has been reviewed and procedure approved by Dr. Kathlene Cote.  As patient has had plavix on 1.22.23 she will be tentatively scheduled for 1.27.23.  Team instructed to: Keep Patient to be NPO after midnight Hold Plavix for 5 days. IR will call patient when ready.  Should patient condition improve and she is medical stable for discharge the  procedure can be performed as outpatient. Tem made aware via epic chat.

## 2021-04-01 NOTE — Progress Notes (Signed)
Family Medicine Teaching Service Daily Progress Note Intern Pager: [phone]  Patient name: Tammy Elliott Medical record number: [id number] Date of birth: 1951/02/11 Age: 71 y.o. Gender: female  Primary Care Provider: Baxter Hire, MD Consultants: oncology Code Status: full  Pt Overview and Major Events to Date:  1/21: admitted, oncology consulted  Assessment and Plan:  SMANTHA BOAKYE is a 71 y.o. female admitted with concern for liver metastasis after presenting with generalized weakness/fatigue and R sided abdominal pain. PMH significant for breast cancer s/p L mastectomy, CAD s/p LAD stent, DM, HLD, GERD, SMA ischemia, and cholecystitis s/p cholecystectomy.  Concern for Liver Metastasis   Hx Breast Cancer s/p L Mastectomy CT abd/pelvis on admission showed multiple lesions throughout liver concerning for metastatic disease. AST/ALT elevated to 216/125 this morning, which is slightly improved from yesterday. Alk phos also elevated. Bili downtrended to 1.2 -Oncology consulted, appreciate recommendations -f/u CT chest w/contrast -tentative plan for liver biopsy Monday, 1/23 -will discuss DVT prophylaxis w/IR given upcoming biopsy -Tylenol 642m q8h prn for pain -Daily CMP  Type 2 Diabetes Fairly well controlled-last A1c 7.3% in August 2022. Glucose 103 on am labs today. -A1c pending -Continue home Metformin 5072mBID and Jardiance 2589maily -Carb modified diet  CAD s/p LAD stent   HLD Chronic, stable. -Continue home Plavix 63m37mily -Continue Pravastatin 40mg57mrmulary alternative to home lovastatin)   FEN/GI: heart healthy/carb modified diet PPx: SCDs, will discuss heparin w/IR (due to upcoming liver biopsy) Dispo:Home  pending further workup .  Subjective:  No acute events overnight. Patient with discomfort throughout right side of abdomen this morning, but states it's very mild. Mentally she's trying to take things one step at a time and control what she can control.  Admits this is a huge shock for her.  Objective: Temp:  [97.8 F (36.6 C)-98.2 F (36.8 C)] 97.8 F (36.6 C) (01/22 0419) Pulse Rate:  [71-83] 74 (01/22 0419) Resp:  [15-20] 15 (01/22 0419) BP: (121-137)/(58-100) 129/69 (01/22 0419) SpO2:  [96 %-100 %] 97 % (01/22 0419) Weight:  [78.9 kg] 78.9 kg (01/21 1216) Physical Exam: General: alert, resting comfortably, NAD Cardiovascular: RRR, normal S1/S2 Respiratory: normal respiratory effort Abdomen: mild tenderness to palpation in RUQ and RLQ, no guarding Extremities: no peripheral edema  Laboratory: Recent Labs  Lab 03/31/21 1252 04/01/21 0440  WBC 5.5 5.4  HGB 13.4 12.7  HCT 40.9 38.1  PLT 184 186   Recent Labs  Lab 03/31/21 1228 04/01/21 0440  NA 136 137  K 5.4* 3.9  CL 102 103  CO2 18* 23  BUN 13 13  CREATININE 0.95 0.74  CALCIUM 9.2 8.7*  PROT 6.7 6.8  BILITOT 2.5* 1.2  ALKPHOS 259* 252*  ALT 136* 125*  AST 257* 216*  GLUCOSE 151* 103*     Imaging/Diagnostic Tests: CT ABDOMEN PELVIS W CONTRAST Result Date: 03/31/2021 IMPRESSION: 1. No acute findings identified within the abdomen or pelvis. 2. Extensive ill-defined low-attenuation lesions throughout both lobes of liver compatible with metastatic disease. This is a new finding when compared with previous CT of the abdomen pelvis from 03/02/2020. No primary neoplasm identified within the abdomen or pelvis. 3. Aortic Atherosclerosis (ICD10-I70.0). Electronically Signed   By: TayloKerby Moors   On: 03/31/2021 14:39     WellsAlcus Dad1/22/2023, 7:00 AM PGY-2, [facility] pager: [phone] pages welcome

## 2021-04-01 NOTE — Hospital Course (Addendum)
Liver metastases Patient presented with 6 weeks of SOB, fatigue, weakness, and R sided abdominal pain. Lipase 67. K 5.4, AST 257, ALT 136, alk phos 259, T bili 2.5. UA >500 glucose (she is taking an SGLT2). Trop negative. CTAP with new extensive ill-defined low-attenuation lesions throughout both lobes of liver compatible with metastatic disease since 03/02/20. No primary neoplasm identified within the chest, abdomen, or pelvis on CT. Oncology recommended liver biopsy on 04/06/21 due to need to hold plavix for 5 days before procedure. She was discharged in stable condition and was amenable to following up for liver biopsy with IR outpatient.  T2DM   CAD s/p LAD stent   HLD Patient's blood glucose remained stable over admission on jardiance 25 mg daily and metformin 500 mg BID. She was also continued on atorvastatin 40 mg daily. It was recommended for her to discuss her diabetes medication with PCP at follow up.

## 2021-04-02 ENCOUNTER — Encounter (HOSPITAL_COMMUNITY): Payer: Self-pay | Admitting: Family Medicine

## 2021-04-02 ENCOUNTER — Other Ambulatory Visit: Payer: Self-pay

## 2021-04-02 ENCOUNTER — Telehealth: Payer: Self-pay

## 2021-04-02 DIAGNOSIS — R7401 Elevation of levels of liver transaminase levels: Secondary | ICD-10-CM | POA: Diagnosis not present

## 2021-04-02 DIAGNOSIS — C787 Secondary malignant neoplasm of liver and intrahepatic bile duct: Secondary | ICD-10-CM | POA: Diagnosis not present

## 2021-04-02 LAB — COMPREHENSIVE METABOLIC PANEL
ALT: 128 U/L — ABNORMAL HIGH (ref 0–44)
AST: 230 U/L — ABNORMAL HIGH (ref 15–41)
Albumin: 3 g/dL — ABNORMAL LOW (ref 3.5–5.0)
Alkaline Phosphatase: 253 U/L — ABNORMAL HIGH (ref 38–126)
Anion gap: 15 (ref 5–15)
BUN: 14 mg/dL (ref 8–23)
CO2: 23 mmol/L (ref 22–32)
Calcium: 9.3 mg/dL (ref 8.9–10.3)
Chloride: 101 mmol/L (ref 98–111)
Creatinine, Ser: 0.85 mg/dL (ref 0.44–1.00)
GFR, Estimated: >60 mL/min (ref >60)
Glucose, Bld: 161 mg/dL — ABNORMAL HIGH (ref 70–99)
Potassium: 4 mmol/L (ref 3.5–5.1)
Sodium: 139 mmol/L (ref 135–145)
Total Bilirubin: 1.2 mg/dL (ref 0.3–1.2)
Total Protein: 6.8 g/dL (ref 6.5–8.1)

## 2021-04-02 LAB — HEPATITIS B SURFACE ANTIBODY, QUANTITATIVE: Hep B S AB Quant (Post): <3.1 m[IU]/mL — ABNORMAL LOW (ref >9.9)

## 2021-04-02 LAB — HEMOGLOBIN A1C
Hgb A1c MFr Bld: 7 % — ABNORMAL HIGH (ref 4.8–5.6)
Mean Plasma Glucose: 154 mg/dL

## 2021-04-02 LAB — GLUCOSE, CAPILLARY: Glucose-Capillary: 142 mg/dL — ABNORMAL HIGH (ref 70–99)

## 2021-04-02 MED ORDER — VITAMIN D (CHOLECALCIFEROL) 25 MCG (1000 UT) PO CAPS: ORAL_CAPSULE | ORAL | 0 refills | Status: AC

## 2021-04-02 NOTE — Progress Notes (Signed)
Pt liver lesion biopsy was scheduled as inpatient 04/06/21. Pt is being d/c today. Request for order was made to MD to enter new order for OP liver lesion biopsy. Schedulers to call and reschedule liver lesion biopsy.       Narda Rutherford, AGNP-BC 04/02/2021, 12:18 PM

## 2021-04-02 NOTE — Evaluation (Signed)
Physical Therapy Evaluation Patient Details Name: Tammy Elliott MRN: [id number] DOB: 10-Dec-1950 Today's Date: 04/02/2021  History of Present Illness  Patient is a 71 y/o female who presents on 03/31/21 with RUQ/flank pain, dizziness, SOB, weakness, fatigue. Found to have new metastatic lesions of the liver. Plan for liver biopsy in outpatient setting. PMH includes CAD, DM and breast ca s/p mastectomy.  Clinical Impression  Patient independent and works out twice weekly with a Physiological scientist; lives at home with her spouse and works as a Copywriter, advertising for [employer]. Pt has not been feeling well the last couple of weeks reporting SOB and fatigue. Today, pt Mod I for all mobility including gait and stairs. Reports mild fatigue and discomfort today needing seated rest break during session. Discussed exercise/activity recommendations/modifications pending biopsy and results/plan for treatment if any. Recommend OPPT consult (pt wants the script to set up an appt after her biopsy or when she has more info/a plan moving forward) to use post biopsy to improve overall strength, mobility, balance, AROM and cardiovascular conditioning. All education completed. Discharge from therapy.     Recommendations for follow up therapy are one component of a multi-disciplinary discharge planning process, led by the attending physician.  Recommendations may be updated based on patient status, additional functional criteria and insurance authorization.  Follow Up Recommendations Outpatient PT (with PT who specializes in oncology)    Assistance Recommended at Discharge PRN  Patient can return home with the following       Equipment Recommendations None recommended by PT  Recommendations for Other Services       Functional Status Assessment Patient has had a recent decline in their functional status and demonstrates the ability to make significant improvements in function in a reasonable and predictable amount of  time.     Precautions / Restrictions Precautions Precautions: None Restrictions Weight Bearing Restrictions: No      Mobility  Bed Mobility Overal bed mobility: Modified Independent                  Transfers Overall transfer level: Modified independent                      Ambulation/Gait Ambulation/Gait assistance: Modified independent (Device/Increase time) Gait Distance (Feet): 250 Feet Assistive device: None Gait Pattern/deviations: Step-through pattern, Decreased stride length   Gait velocity interpretation: >2.62 ft/sec, indicative of community ambulatory   General Gait Details: Steady gait with a few instances of foot catching , reports her shoes are sticking to the floor.  Stairs Stairs: Yes Stairs assistance: Modified independent (Device/Increase time) Stair Management: No rails Number of Stairs: 4 General stair comments: Cues for safety, no rails needed.  Wheelchair Mobility    Modified Rankin (Stroke Patients Only)       Balance Overall balance assessment: No apparent balance deficits (not formally assessed)                                           Pertinent Vitals/Pain Pain Assessment Pain Assessment: 0-10 Pain Score: 3  Pain Location: RUQ Pain Descriptors / Indicators: Aching Pain Intervention(s): Monitored during session    Home Living Family/patient expects to be discharged to:: Private residence Living Arrangements: Spouse/significant other Available Help at Discharge: Family Type of Home: House Home Access: Stairs to enter Entrance Stairs-Rails: None Entrance Stairs-Number of Steps: 3 Alternate Level Stairs-Number of  Steps: 1 flight Home Layout: Two level;Bed/bath upstairs Home Equipment: None      Prior Function Prior Level of Function : Independent/Modified Independent             Mobility Comments: Works out with a Physiological scientist twice/week; works as Copywriter, advertising for [employer]        Extremity/Trunk Assessment   Upper Extremity Assessment Upper Extremity Assessment: Defer to OT evaluation (Limited AROM BUEs due to mastectomy)    Lower Extremity Assessment Lower Extremity Assessment: Overall WFL for tasks assessed    Cervical / Trunk Assessment Cervical / Trunk Assessment: Normal  Communication   Communication: No difficulties  Cognition Arousal/Alertness: Awake/alert Behavior During Therapy: WFL for tasks assessed/performed Overall Cognitive Status: Within Functional Limits for tasks assessed                                          General Comments General comments (skin integrity, edema, etc.): Discussed exercise/activity recommendations/modifications pending biopsy and results/plan for treatment if any.    Exercises     Assessment/Plan    PT Assessment Patient does not need any further PT services  PT Problem List         PT Treatment Interventions      PT Goals (Current goals can be found in the Care Plan section)  Acute Rehab PT Goals Patient Stated Goal: to go home PT Goal Formulation: All assessment and education complete, DC therapy    Frequency       Co-evaluation               AM-PAC PT "6 Clicks" Mobility  Outcome Measure Help needed turning from your back to your side while in a flat bed without using bedrails?: None Help needed moving from lying on your back to sitting on the side of a flat bed without using bedrails?: None Help needed moving to and from a bed to a chair (including a wheelchair)?: None Help needed standing up from a chair using your arms (e.g., wheelchair or bedside chair)?: None Help needed to walk in hospital room?: None Help needed climbing 3-5 steps with a railing? : None 6 Click Score: 24    End of Session   Activity Tolerance: Patient tolerated treatment well Patient left: in bed;with call bell/phone within reach Nurse Communication: Mobility  status PT Visit Diagnosis: Other abnormalities of gait and mobility (R26.89)    Time: 9326-7124 PT Time Calculation (min) (ACUTE ONLY): 20 min   Charges:   PT Evaluation $PT Eval Low Complexity: 1 Low          Marisa Severin, PT, DPT Acute Rehabilitation Services Pager [phone] Office [phone]     Marguarite Arbour A Sabra Heck 04/02/2021, 12:37 PM

## 2021-04-02 NOTE — Discharge Instructions (Addendum)
It was so nice to meet you! Thank you for allowing [place] to take part in your care. Here are your discharge instructions:  Your liver biopsy of the liver masses that were found on imaging will take place this Friday, 1/27. Please ask interventional radiology about the time and location of your procedure. Please do not eat after 12 AM Thursday night/Friday morning before your procedure. Do not take your Plavix until interventional radiology (the people performing your procedure) clears you to do so. We will await your biopsy results for further treatment. Also be sure to follow up with your oncologist and primary care provider.  If you have any questions between now and your procedure, please let us know.  Mikal Plane, [place]

## 2021-04-02 NOTE — Telephone Encounter (Signed)
Request for US liver biospy faxed to [facility]. Waiting on date to schedule follow up 1 week after bx.

## 2021-04-02 NOTE — Discharge Summary (Addendum)
[facility] Discharge Summary  Patient name: Tammy Elliott Medical record number: [id number] Date of birth: 02/26/1951 Age: 71 y.o. Gender: female Date of Admission: 03/31/2021  Date of Discharge: 04/02/20 Admitting Physician: Shary Key, DO  Primary Care Provider: Baxter Hire, MD Consultants: Oncology  Indication for Hospitalization: Liver metastasis on CTAP  Discharge Diagnoses/Problem List:  Hospital Problem List as of 04/02/2021          Priority Resolved POA     Unprioritized     * (Principal) Liver metastasis ([place])   Yes     Transaminitis   Unknown    Disposition: Home  Discharge Condition: Stable  Discharge Exam: PHYSICAL EXAM  GEN: Well developed, well-nourished, in NAD, conversant HEAD: NCAT CVS: Normal rate  RESP: Breathing comfortably on RA ABD: Non-distended EXT: Moves all extremities equally    Brief Hospital Course:  Liver metastases Patient presented with 6 weeks of SOB, fatigue, weakness, and R sided abdominal pain. Lipase 67. K 5.4, AST 257, ALT 136, alk phos 259, T bili 2.5. UA >500 glucose (she is taking an SGLT2). Trop negative. CTAP with new extensive ill-defined low-attenuation lesions throughout both lobes of liver compatible with metastatic disease since 03/02/20. No primary neoplasm identified within the chest, abdomen, or pelvis on CT. Oncology recommended liver biopsy on 04/06/21 due to need to hold plavix for 5 days before procedure. She was discharged in stable condition and was amenable to following up for liver biopsy with IR outpatient.  T2DM   CAD s/p LAD stent   HLD Patient's blood glucose remained stable over admission on jardiance 25 mg daily and metformin 500 mg BID. She was also continued on atorvastatin 40 mg daily. It was recommended for her to discuss her diabetes medication with PCP at follow up.    Issues for Follow Up:  Liver biopsy results and treatment plan from oncology Discuss diabetes  medication with PCP as several medications were held during hospitalization which may be able to be discontinued  Significant Procedures: None  Significant Labs and Imaging:  Recent Labs  Lab 03/31/21 1252 04/01/21 0440  WBC 5.5 5.4  HGB 13.4 12.7  HCT 40.9 38.1  PLT 184 186   Recent Labs  Lab 03/31/21 1228 04/01/21 0440 04/02/21 0118  NA 136 137 139  K 5.4* 3.9 4.0  CL 102 103 101  CO2 18* 23 23  GLUCOSE 151* 103* 161*  BUN _0 CREATININE 0.95 0.74 0.85  CALCIUM 9.2 8.7* 9.3  ALKPHOS 259* 252* 253*  AST 257* 216* 230*  ALT 136* 125* 128*  ALBUMIN 3.4* 3.1* 3.0*   Results/Tests Pending at Time of Discharge: None  Discharge Medications:  Allergies as of 04/02/2021       Reactions   Penicillins Hives, Swelling   Exenatide Nausea Only   Orlistat Diarrhea   Thiazide-type Diuretics    Unknown reaction        Medication List     STOP taking these medications    clopidogrel 75 MG tablet Commonly known as: PLAVIX   glimepiride 4 MG tablet Commonly known as: AMARYL   Vitamin D (Cholecalciferol) 25 MCG (1000 UT) Tabs Replaced by: Vitamin D (Cholecalciferol) 25 MCG (1000 UT) Caps       TAKE these medications    acetaminophen 500 MG tablet Commonly known as: TYLENOL Take 1,000 mg by mouth every 6 (six) hours as needed for moderate pain.   anastrozole 1 MG tablet Commonly known  as: ARIMIDEX Take 1 tablet (1 mg total) by mouth daily.   CALCIUM-MAGNESIUM-ZINC PO Take 1 tablet by mouth every evening.   Contour Next Test test strip Generic drug: glucose blood Use once daily e11.65   Janumet 50-500 MG tablet Generic drug: sitaGLIPtin-metformin Take 1 tablet by mouth 2 (two) times daily.   Jardiance 25 MG Tabs tablet Generic drug: empagliflozin Take 25 mg by mouth daily.   lansoprazole 15 MG capsule Commonly known as: PREVACID Take 15 mg by mouth daily as needed (acid reflux).   lovastatin 40 MG tablet Commonly known as: MEVACOR Take 40  mg by mouth every evening.   metFORMIN 500 MG tablet Commonly known as: GLUCOPHAGE Take 500 mg by mouth 2 (two) times daily.   multivitamin tablet Take 1 tablet by mouth daily.   pioglitazone 30 MG tablet Commonly known as: ACTOS Take 30 mg by mouth daily.   Vitamin D (Cholecalciferol) 25 MCG (1000 UT) Caps Take 1 daily Replaces: Vitamin D (Cholecalciferol) 25 MCG (1000 UT) Tabs       Discharge Instructions: Please refer to Patient Instructions section of EMR for full details.  Patient was counseled important signs and symptoms that should prompt return to medical care, changes in medications, dietary instructions, activity restrictions, and follow up appointments.   Follow-Up Appointments: In process of scheduling liver biopsy per IR  Signed: Shary Key, DO 04/02/2021, 4:17 PM [facility] Pager: [phone]

## 2021-04-02 NOTE — Progress Notes (Signed)
Pt IV removed without complications, pt going home with husband as transportation and knows that liver biopsy will be calling to schedule apt. AVS reviewed and pt verbalized all understanding of DC teaching and instructions.

## 2021-04-02 NOTE — H&P (Signed)
Chief Complaint: Patient was seen in consultation today for image guided liver lesion biopsy.   at the request of K. Gwendlyn Deutscher, MD  Referring Physician(s): Su Ley, MD   Supervising Physician: Daryll Brod  Patient Status: [facility] - In-pt  History of Present Illness: Tammy Elliott is a 71 y.o. female w/ PMH of CAD, chronic anticoagulation, DOE, GERD, hypercholesteremia, left lobular carcinoma s/p mastectomy, skin cancer, T2DM and liver mass. Pt presented to ED 03/31/21 c/o abd pain and weakness. Ct abd/pelvis at that time showed new liver lesion throughout both lobes compatible with metastasis. Dr. Su Ley has referred pt to IR for liver lesion biopsy. Procedure was approved by Dr. Kathlene Cote, [place]. D/t pt taking Plavix, procedure is tentatively scheduled for 04/06/21.   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 [facility]) 03/11/2019   a.) Stage IA (cT1b, cN0, cM0, G2, ER+, PR+, HER2-); b.) adjuvant endocrine therapy started 06/2019; c.) s/p total mastectomy, SNLB, and breast reconstruction in 04/2019   Skin cancer, basal cell    s/p excision   Superior mesenteric artery stenosis (HCC)    T2DM (type 2 diabetes mellitus) ([place])     Past Surgical History:  Procedure Laterality Date   BREAST BIOPSY Left 03/11/2019   Stereotactic CNB; pathology --> ER/PR (-), HER2/neu (-) IMC iwth lobular features   BREAST RECONSTRUCTION WITH PLACEMENT OF TISSUE EXPANDER AND FLEX HD (ACELLULAR HYDRATED DERMIS) Left 05/03/2019   Procedure: LEFT BREAST RECONSTRUCTION WITH PLACEMENT OF TISSUE EXPANDER AND FLEX HD (ACELLULAR  HYDRATED DERMIS);  Surgeon: Wallace Going, DO;  Location: [facility];  Service: Plastics;  Laterality: Left;   CHOLECYSTECTOMY     COLONOSCOPY     CORONARY ANGIOPLASTY WITH STENT PLACEMENT Left 04/12/2010   Procedure: LHC with placement of 2.5 x 15 mm Xience V DES x 1 to pLAD; Location: [place]; Surgeon: Isaias Cowman, MD   EXCISION OF BREAST BIOPSY Left 11/17/2020   Procedure: EXCISION OF BREAST BIOPSY;  Surgeon: Herbert Pun, MD;  Location: [facility];  Service: General;  Laterality: Left;   LIPOSUCTION WITH LIPOFILLING Bilateral 01/27/2020   Procedure: LIPOSUCTION WITH LIPOFILLING;  Surgeon: Wallace Going, DO;  Location: [place];  Service: Plastics;  Laterality: Bilateral;  90 min   MASTOPEXY Right 07/01/2019   Procedure: MASTOPEXY;  Surgeon: Wallace Going, DO;  Location: [place];  Service: Plastics;  Laterality: Right;   REMOVAL OF TISSUE EXPANDER AND PLACEMENT OF IMPLANT Left 07/01/2019   Procedure: REMOVAL OF TISSUE EXPANDER AND PLACEMENT OF IMPLANT;  Surgeon: Wallace Going, DO;  Location: [place];  Service: Plastics;  Laterality: Left;  total case 2.5 hours   TOTAL MASTECTOMY Left 05/03/2019   Procedure: TOTAL MASTECTOMY W/ Sentinel Node;  Surgeon: Herbert Pun, MD;  Location: [facility];  Service: General;  Laterality: Left;   VISCERAL ANGIOGRAPHY N/A 05/01/2020   Procedure: VISCERAL ANGIOGRAPHY;  Surgeon: Algernon Huxley, MD;  Location: [place] [facility];  Service: Cardiovascular;  Laterality: N/A;    Allergies: Penicillins,  Exenatide, Orlistat, and Thiazide-type diuretics  Medications: Prior to Admission medications   Medication Sig Start Date End Date Taking? Authorizing Provider  acetaminophen (TYLENOL) 500 MG tablet Take 1,000 mg by mouth every 6 (six) hours as needed for moderate pain.   Yes [provider]  anastrozole (ARIMIDEX) 1 MG tablet Take 1 tablet (1 mg total) by  mouth daily. 01/30/21  Yes Earlie Server, MD  CALCIUM-MAGNESIUM-ZINC PO Take 1 tablet by mouth every evening.    Yes [provider]  clopidogrel (PLAVIX) 75 MG tablet Take 75 mg by mouth daily.  04/17/18  Yes [provider]  glimepiride (AMARYL) 4 MG tablet Take 4 mg by mouth daily with breakfast. 04/17/18  Yes [provider]  JANUMET 50-500 MG tablet Take 1 tablet by mouth 2 (two) times daily. 04/17/18  Yes [provider]  JARDIANCE 25 MG TABS tablet Take 25 mg by mouth daily.  04/06/18  Yes [provider]  lansoprazole (PREVACID) 15 MG capsule Take 15 mg by mouth daily as needed (acid reflux). 04/17/18  Yes [provider]  lovastatin (MEVACOR) 40 MG tablet Take 40 mg by mouth every evening. 04/17/18  Yes [provider]  metFORMIN (GLUCOPHAGE) 500 MG tablet Take 500 mg by mouth 2 (two) times daily.  04/17/18  Yes [provider]  Multiple Vitamin (MULTIVITAMIN) tablet Take 1 tablet by mouth daily.   Yes [provider]  pioglitazone (ACTOS) 30 MG tablet Take 30 mg by mouth daily. 03/16/21  Yes [provider]  Vitamin D, Cholecalciferol, 25 MCG (1000 UT) TABS Take 1,000 mg by mouth daily.   Yes [provider]  glucose blood (CONTOUR NEXT TEST) test strip Use once daily e11.65 04/07/20   [provider]     Family History  Problem Relation Age of Onset   CAD Mother    Diabetes Mellitus II Mother    Hypertension Mother    Uterine cancer Mother        dx late 57s   CAD Father    Heart attack Father    Hypertension Father    Diabetes Mellitus II Father        Took insulin   Alcohol abuse Father    Diabetes Mellitus II Sister    Cancer Sister        possible cervical or uterine   Lung cancer Paternal Aunt    Cancer Paternal Uncle        unk type   Diabetes Other        Almost all sisters are diabetic or pre-diabetic    Cancer Cousin        possible colon/stomach/something in abdomen    Breast cancer Neg Hx     Social History   Socioeconomic History   Marital status: Married    Spouse name: Not on file   Number of children: Not on file   Years of education: Not on file   Highest education level: Not on file  Occupational History   Occupation: Printmaker: [employer]  Tobacco Use   Smoking status: Never   Smokeless tobacco: Never  Vaping Use   Vaping Use: Never used  Substance and Sexual Activity   Alcohol use: Not Currently    Comment: rarely   Drug use: Never   Sexual activity: Not on file  Other Topics Concern   Not on file  Social History Narrative   reports that she has never smoked. She has never  used smokeless tobacco. She reports that she rarely drinks alcohol. She reports that she does not use drugs.      Married to [name] ([name]) [name]    Social Determinants of Health   Financial Resource Strain: Not on file  Food Insecurity: Not on file  Transportation Needs: Not on file  Physical Activity: Not on file  Stress: Not on file  Social Connections: Not on file     Review of Systems: A 12 point ROS discussed and pertinent positives are indicated in the HPI above.  All other systems are negative.  Review of Systems  Constitutional:  Positive for fatigue. Negative for appetite change, chills and fever.  HENT:  Negative for nosebleeds.   Respiratory:  Positive for shortness of breath. Negative for cough.   Cardiovascular:  Negative for chest pain and leg swelling.  Gastrointestinal:  Positive for abdominal pain. Negative for blood in stool, nausea and vomiting.  Genitourinary:  Negative for hematuria.  Neurological:  Positive for weakness. Negative for dizziness and light-headedness.  Psychiatric/Behavioral:  The patient is nervous/anxious.    Vital Signs: BP (!) 143/69 (BP Location: Left Arm)    Pulse 68    Temp 97.8 F (36.6 C) (Oral)    Resp 16    Ht '5\' 8"'  (1.727 m)    Wt 174 lb (78.9 kg)    SpO2 100%    BMI 26.46 kg/m   Physical  Exam Constitutional:      Appearance: Normal appearance. She is not ill-appearing.  HENT:     Head: Normocephalic and atraumatic.     Mouth/Throat:     Mouth: Mucous membranes are moist.     Pharynx: Oropharynx is clear.  Cardiovascular:     Rate and Rhythm: Normal rate and regular rhythm.     Pulses: Normal pulses.     Heart sounds: Normal heart sounds. No murmur heard.   No friction rub. No gallop.  Pulmonary:     Effort: Pulmonary effort is normal. No respiratory distress.     Breath sounds: Normal breath sounds. No stridor. No wheezing, rhonchi or rales.  Abdominal:     General: There is no distension.     Palpations: Abdomen is soft.     Tenderness: There is no abdominal tenderness. There is no guarding.  Musculoskeletal:     Right lower leg: No edema.     Left lower leg: No edema.  Skin:    General: Skin is warm and dry.  Neurological:     Mental Status: She is alert and oriented to person, place, and time.  Psychiatric:        Mood and Affect: Mood normal.        Behavior: Behavior normal.        Thought Content: Thought content normal.        Judgment: Judgment normal.    Imaging: DG Chest 2 View  Result Date: 03/31/2021 CLINICAL DATA:  Shortness of breath. EXAM: CHEST - 2 VIEW COMPARISON:  None. FINDINGS: Heart size and mediastinal contours are normal. No pleural effusion or edema identified. No airspace opacities. Visualized osseous structures are unremarkable. IMPRESSION: No active cardiopulmonary abnormalities. Electronically Signed   By: Kerby Moors M.D.   On: 03/31/2021 14:04   CT CHEST W CONTRAST  Result Date: 04/01/2021 CLINICAL DATA:  Evaluate for malignancy. Newly discovered liver metastases. EXAM: CT CHEST WITH CONTRAST TECHNIQUE: Multidetector CT imaging of the chest was performed during intravenous contrast administration. RADIATION DOSE REDUCTION: This exam  was performed according to the departmental dose-optimization program which includes automated  exposure control, adjustment of the mA and/or kV according to patient size and/or use of iterative reconstruction technique. CONTRAST:  14m OMNIPAQUE IOHEXOL 350 MG/ML SOLN COMPARISON:  CT AP 03/31/2021. FINDINGS: Cardiovascular: No significant vascular findings. Normal heart size. No pericardial effusion. Aortic atherosclerosis and coronary artery calcifications. Mediastinum/Nodes: No enlarged mediastinal, hilar, or axillary lymph nodes. Thyroid gland, trachea, and esophagus demonstrate no significant findings. Lungs/Pleura: Scar like density identified within the superior segment of right lower lobe. No pleural effusion, airspace consolidation, or pneumothorax. No suspicious pulmonary nodule or mass identified. Dependent changes are noted within the posterior lung bases. Upper Abdomen: Multifocal liver metastases are again identified involving both lobes of liver. No acute abnormality noted within the imaged portions of the upper abdomen. Musculoskeletal: Status post left mastectomy with implant reconstruction. There is mild skin thickening with subcutaneous soft tissue stranding within the periphery of the reconstructed left breast, image 90/3. IMPRESSION: 1. No evidence for primary lung neoplasm or metastatic disease to the chest. 2. Status post left mastectomy with implant reconstruction. There is mild skin thickening with subcutaneous soft tissue stranding within the periphery of the reconstructed left breast. This is nonspecific and may be related to postsurgical change. 3. Multifocal liver metastases. 4. Coronary artery calcifications. 5. Aortic Atherosclerosis (ICD10-I70.0). Electronically Signed   By: TKerby MoorsM.D.   On: 04/01/2021 11:42   CT ABDOMEN PELVIS W CONTRAST  Result Date: 03/31/2021 CLINICAL DATA:  Abdominal pain. EXAM: CT ABDOMEN AND PELVIS WITH CONTRAST TECHNIQUE: Multidetector CT imaging of the abdomen and pelvis was performed using the standard protocol following bolus  administration of intravenous contrast. RADIATION DOSE REDUCTION: This exam was performed according to the departmental dose-optimization program which includes automated exposure control, adjustment of the mA and/or kV according to patient size and/or use of iterative reconstruction technique. CONTRAST:  719mOMNIPAQUE IOHEXOL 300 MG/ML  SOLN COMPARISON:  03/02/2020 FINDINGS: Lower chest: Visualized lung bases are clear. Hepatobiliary: Extensive ill-defined low-attenuation lesions are identified throughout both lobes of compatible with metastatic disease. This is a new finding when compared with 03/02/2020. Index lesion within right lobe of liver measures 2.8 by 2.1 cm, image 16/3. Index lesion within segment 4 measures 2.4 x 2.0 cm, image 27/3. Index lesion within segment 2/3 measures 2.4 x 2.3 cm, image 25/3. Status post cholecystectomy. No bile duct dilatation. Pancreas: Unremarkable. No pancreatic ductal dilatation or surrounding inflammatory changes. Spleen: Normal in size without focal abnormality. Adrenals/Urinary Tract: Normal adrenal glands. No kidney mass or hydronephrosis identified. No nephrolithiasis. Urinary bladder is unremarkable. Stomach/Bowel: Stomach appears within normal limits. No small bowel wall thickening, inflammation, or distension. No pathologic dilatation of the colon. No obstructing colonic mass identified. Vascular/Lymphatic: Aortic atherosclerosis. No aneurysm. No abdominopelvic adenopathy. Reproductive: Small exophytic calcified fibroid arises off the anterior uterus. Uterus is otherwise unremarkable. No adnexal mass. Other: No free fluid or fluid collections. No aggressive lytic or sclerotic bone lesions. Musculoskeletal: No acute or significant osseous findings. IMPRESSION: 1. No acute findings identified within the abdomen or pelvis. 2. Extensive ill-defined low-attenuation lesions throughout both lobes of liver compatible with metastatic disease. This is a new finding when  compared with previous CT of the abdomen pelvis from 03/02/2020. No primary neoplasm identified within the abdomen or pelvis. 3. Aortic Atherosclerosis (ICD10-I70.0). Electronically Signed   By: TaKerby Moors.D.   On: 03/31/2021 14:39    Labs:  CBC: Recent Labs    04/20/20 1034 10/18/20  1301 03/31/21 1252 04/01/21 0440  WBC 6.1 5.1 5.5 5.4  HGB 14.3 13.6 13.4 12.7  HCT 42.1 40.6 40.9 38.1  PLT 219 216 184 186    COAGS: Recent Labs    04/01/21 0440  INR 1.0  APTT 30    BMP: Recent Labs    10/18/20 1301 03/31/21 1228 04/01/21 0440 04/02/21 0118  NA 136 136 137 139  K 4.0 5.4* 3.9 4.0  CL 102 102 103 101  CO2 24 18* 23 23  GLUCOSE 93 151* 103* 161*  BUN 29* '13 13 14  ' CALCIUM 9.1 9.2 8.7* 9.3  CREATININE 0.71 0.95 0.74 0.85  GFRNONAA >60 >60 >60 >60    LIVER FUNCTION TESTS: Recent Labs    10/18/20 1301 03/31/21 1228 04/01/21 0440 04/02/21 0118  BILITOT 0.8 2.5* 1.2 1.2  AST 24 257* 216* 230*  ALT 19 136* 125* 128*  ALKPHOS 55 259* 252* 253*  PROT 7.4 6.7 6.8 6.8  ALBUMIN 4.2 3.4* 3.1* 3.0*    TUMOR MARKERS: No results for input(s): AFPTM, CEA, CA199, CHROMGRNA in the last 8760 hours.  Assessment and Plan: History of CAD, chronic anticoagulation, DOE, GERD, hypercholesteremia, left lobular carcinoma s/p mastectomy, skin cancer, T2DM and liver mass. Pt presented to ED 03/31/21 c/o abd pain and weakness. Ct abd/pelvis at that time showed new liver lesion throughout both lobes compatible with metastasis. Dr. Su Ley has referred pt to IR for liver lesion biopsy. Procedure was approved by Dr. Kathlene Cote, [place]. D/t pt taking Plavix, procedure is scheduled for 04/06/21.   Pt sitting up in bed fully dressed. She states she is being d/c today.  She is A&O, calm and pleasant. She is in no distress. Pt is aware that she will be NPO for her OP procedure. She understands that Plavix must be held for 5 days.  Pt LFTs abnormally high during admission.  Risks and  benefits of liver lesion biopsy was discussed with the patient and/or patient's family including, but not limited to bleeding, infection, damage to adjacent structures or low yield requiring additional tests.  All of the questions were answered and there is agreement to proceed.  Consent signed and in chart.   Thank you for this interesting consult.  I greatly enjoyed meeting Tammy Elliott and look forward to participating in their care.  A copy of this report was sent to the requesting provider on this date.  Electronically Signed: Tyson Alias, NP 04/02/2021, 9:29 AM   I spent a total of 20 minutes in face to face in clinical consultation, greater than 50% of which was counseling/coordinating care for image guided liver lesion biopsy.

## 2021-04-02 NOTE — Plan of Care (Signed)
  Problem: Clinical Measurements: Goal: Diagnostic test results will improve Outcome: Not Progressing   Problem: Coping: Goal: Level of anxiety will decrease Outcome: Not Progressing   

## 2021-04-02 NOTE — Progress Notes (Incomplete)
Family Medicine Teaching Service Daily Progress Note Intern Pager: [phone]  Patient name: Tammy Elliott Medical record number: [id number] Date of birth: 03-19-1950 Age: 71 y.o. Gender: female  Primary Care Provider: Baxter Hire, MD Consultants: Oncology Code Status: Full  Pt Overview and Major Events to Date:  1/21 - admitted  Assessment and Plan: Tammy Elliott is a 71 y.o. female admitted with concern for liver metastasis after presenting with generalized weakness/fatigue and R sided abdominal pain. PMH significant for breast cancer s/p L mastectomy, CAD s/p LAD stent, DM, HLD, GERD, SMA ischemia, and cholecystitis s/p cholecystectomy.   Concern for Liver Metastasis   Hx Breast Cancer s/p L Mastectomy Patient is doing well this morning with improved RUQ pain *** on tylenol. CMP this morning demonstrated stable LFTs with AST 230, ALT 128, and alk phos 253. Hepatitis panel nonreactive. HbSAb is pending. Spoke with IR after CTAP demonstrated findings consistent with potential liver metastases who recommended outpatient liver biopsy on 04/06/21 given she would need to be off plavix for 5 days. CT chest yesterday was also remarkable for multifocal liver metastases. She is stable and ready for discharge; however, will get PT eval pending imminent discharge to assess functional status before discharge. -Await PT eval then discharge home -F/u IR on 1/27 for liver biopsy -NPO midnight 1/27 -Continue holding plavix until after biopsy   Type 2 Diabetes Patient's CBG was 142 this morning. A1c is still in progress. She has been receiving jardiance 25 mg daily and metformin 500 mg BID. -Continue current home meds   CAD s/p LAD stent   HLD Patient's CAD and HLD are stable. She has been continued on pravastatin 40 mg daily and home plavix 75 mg daily while admitted. -Continue home meds  FEN/GI: Heart healthy/carb modified diet PPx: SQ heparin Dispo: Home pending further workup  Subjective:   ***  Objective: Temp:  [97.7 F (36.5 C)-99 F (37.2 C)] 97.8 F (36.6 C) (01/23 0728) Pulse Rate:  [65-74] 68 (01/23 0728) Resp:  [16-18] 16 (01/23 0728) BP: (128-143)/(67-77) 143/69 (01/23 0728) SpO2:  [96 %-100 %] 100 % (01/23 0728)  Physical Exam: General: *** Cardiovascular: *** Respiratory: *** Abdomen: *** Extremities: ***  Laboratory: Recent Labs  Lab 03/31/21 1252 04/01/21 0440  WBC 5.5 5.4  HGB 13.4 12.7  HCT 40.9 38.1  PLT 184 186   Recent Labs  Lab 03/31/21 1228 04/01/21 0440 04/02/21 0118  NA 136 137 139  K 5.4* 3.9 4.0  CL 102 103 101  CO2 18* 23 23  BUN '13 13 14  ' CREATININE 0.95 0.74 0.85  CALCIUM 9.2 8.7* 9.3  PROT 6.7 6.8 6.8  BILITOT 2.5* 1.2 1.2  ALKPHOS 259* 252* 253*  ALT 136* 125* 128*  AST 257* 216* 230*  GLUCOSE 151* 103* 161*    ***  Imaging/Diagnostic Tests: ***  Signed: Mikal Plane, Medical Student 04/02/2021, 8:26 AM [facility] Pager: [phone]

## 2021-04-02 NOTE — Progress Notes (Signed)
FPTS Brief Progress Note  S: Patient was sleeping comfortable on arrival for evening rounds.  Rounded with patient's nurse present patient has no complaint at this time.   O: BP 136/70 (BP Location: Right Arm)    Pulse 74    Temp 98.3 F (36.8 C) (Oral)    Resp 17    Ht 5\' 8"  (1.727 m)    Wt 78.9 kg    SpO2 97%    BMI 26.46 kg/m   General:Asleep, NAD Pulm: Normal work of breathing on room air.    A/P: Concerning for liver metastasis She has biopsy scheduled for 1/27.  Changed her diet from n.p.o. to carb modified diet. -Continue plan per day team - Orders reviewed. Labs for AM ordered, which was adjusted as needed.   Alen Bleacher, MD 04/02/2021, 12:09 AM PGY-1, [facility] Night Resident  Please page [phone] with questions.

## 2021-04-03 NOTE — Telephone Encounter (Signed)
Pt scheduled for liver biopsy on 1/30. When calling pt to notify her of appt, she states that it is scheduled for Friday. I called centralized scheduling and they confirmed that it was not scheduled for 1/27. The original plan was to have biopsy on 1/27 while inpatient but since she was discharged, the order was cancelled and placed as outpatient. Pt has been off of plavix since 1/22 in preparation for this procedure and is concerned that she will be off too long if procedure is done on 1/30. Called Dr. Saralyn Pilar office and spoke to Amy. Asked for consent to continue to hold plavix unitl 1/30. Will wait for cardiology response/ clearance to decide if we will proceed with procedure.

## 2021-04-04 ENCOUNTER — Encounter: Payer: Self-pay | Admitting: Oncology

## 2021-04-04 NOTE — Telephone Encounter (Signed)
Please advise 

## 2021-04-04 NOTE — Progress Notes (Signed)
Patient for liver biopsy 04/09/2021,called and spoke with patient /husband with pre procedure instructions given. Made aware to be here @  0800, NPO after MN prior to procedure and driver post procedure/recovery/discharge. Stated understanding.

## 2021-04-04 NOTE — Telephone Encounter (Signed)
Spoke to [place] with [facility] cardilogy and informed her that per IR protocol,plavix is to be held for 5 days with procedure on 6th day. Pt has been holding since 1/22. Per Cardiology ok to hold until procedure is done and resume 1 day after or as directed by surgeon. Pt informed that we will keep appt on 1/30 for biopsy and that it is ok to hold plavix until then. Pt verbalized understanding.

## 2021-04-05 ENCOUNTER — Other Ambulatory Visit: Payer: Self-pay | Admitting: Student

## 2021-04-06 ENCOUNTER — Telehealth: Payer: Self-pay

## 2021-04-06 NOTE — Telephone Encounter (Signed)
Speciality scheduling is wanting patient to arrive 30 min earlier to her [facility] appt on Monday, 1/30/203. Appt is originally at 9 am to arrive at 8 am. They need her at office by 7:30 am for appt at 8:30 am. Called patient, no answer, LMTCB with details about appt.

## 2021-04-09 ENCOUNTER — Other Ambulatory Visit: Payer: Self-pay

## 2021-04-09 ENCOUNTER — Ambulatory Visit
Admission: RE | Admit: 2021-04-09 | Discharge: 2021-04-09 | Disposition: A | Discharge status: Home / Self Care | Payer: Medicare Other | Source: Ambulatory Visit | Attending: Oncology | Admitting: Oncology

## 2021-04-09 DIAGNOSIS — C787 Secondary malignant neoplasm of liver and intrahepatic bile duct: Secondary | ICD-10-CM | POA: Insufficient documentation

## 2021-04-09 DIAGNOSIS — Z853 Personal history of malignant neoplasm of breast: Secondary | ICD-10-CM | POA: Insufficient documentation

## 2021-04-09 DIAGNOSIS — R160 Hepatomegaly, not elsewhere classified: Secondary | ICD-10-CM | POA: Diagnosis not present

## 2021-04-09 DIAGNOSIS — K769 Liver disease, unspecified: Secondary | ICD-10-CM | POA: Insufficient documentation

## 2021-04-09 DIAGNOSIS — Z9012 Acquired absence of left breast and nipple: Secondary | ICD-10-CM | POA: Insufficient documentation

## 2021-04-09 HISTORY — DX: Malignant neoplasm of liver, not specified as primary or secondary: C22.9

## 2021-04-09 LAB — CBC
HCT: 41 % (ref 36.0–46.0)
Hemoglobin: 13.4 g/dL (ref 12.0–15.0)
MCH: 30.9 pg (ref 26.0–34.0)
MCHC: 32.7 g/dL (ref 30.0–36.0)
MCV: 94.5 fL (ref 80.0–100.0)
Platelets: 308 10*3/uL (ref 150–400)
RBC: 4.34 MIL/uL (ref 3.87–5.11)
RDW: 14.3 % (ref 11.5–15.5)
WBC: 6.4 10*3/uL (ref 4.0–10.5)
nRBC: 0 % (ref 0.0–0.2)

## 2021-04-09 LAB — GLUCOSE, CAPILLARY
Glucose-Capillary: 161 mg/dL — ABNORMAL HIGH (ref 70–99)
Glucose-Capillary: 146 mg/dL — ABNORMAL HIGH (ref 70–99)

## 2021-04-09 LAB — PROTIME-INR
INR: 1 (ref 0.8–1.2)
Prothrombin Time: 13.3 seconds (ref 11.4–15.2)

## 2021-04-09 MED ORDER — MIDAZOLAM HCL 5 MG/5ML IJ SOLN
INTRAMUSCULAR | Status: AC | PRN
  Administered 2021-04-09 (×2): 1 mg via INTRAVENOUS

## 2021-04-09 MED ORDER — MIDAZOLAM HCL 2 MG/2ML IJ SOLN
INTRAMUSCULAR | Status: AC
  Filled 2021-04-09: qty 2

## 2021-04-09 MED ORDER — SODIUM CHLORIDE 0.9 % IV SOLN: INTRAVENOUS | Status: DC

## 2021-04-09 MED ORDER — FENTANYL CITRATE (PF) 100 MCG/2ML IJ SOLN
INTRAMUSCULAR | Status: AC | PRN
  Administered 2021-04-09 (×2): 50 ug via INTRAVENOUS

## 2021-04-09 MED ORDER — FENTANYL CITRATE (PF) 100 MCG/2ML IJ SOLN
INTRAMUSCULAR | Status: AC
  Filled 2021-04-09: qty 2

## 2021-04-09 NOTE — Procedures (Signed)
Interventional Radiology Procedure Note  Date of Procedure: 04/09/2021  Procedure: US guided liver biopsy, mass   Findings:  1. US guided liver biopsy of focal mass in left hepatic lobe    Complications: No immediate complications noted.   Estimated Blood Loss: minimal  Follow-up and Recommendations: 1. Bedrest 2 hours    Albin Felling, MD  Vascular & Interventional Radiology  04/09/2021 9:21 AM

## 2021-04-09 NOTE — Progress Notes (Signed)
Patient clinically stable post Liver biopsy per Dr Denna Haggard, tolerated well. Vitals stable pre and post procedure. Received Versed 2 mg along with Fentanyl 100 mcg IV for procedure. Report given to Carlynn Spry RN post procedure/specials. Denies complaints presently.

## 2021-04-09 NOTE — H&P (Signed)
Chief Complaint: Patient was seen in consultation today for liver lesions at the request of Yu,Zhou  Referring Physician(s): Yu,Zhou  Supervising Physician: Juliet Rude  Patient Status: [facility] - Out-pt  History of Present Illness: Tammy Elliott is a 71 y.o. female with significant PMHx of breast cancer s/p mastectomy now with liver lesions and request received for image guided biopsy. Patient was seen by our service 04/02/20 with no medical changes except compaints of skin yellowing over the weekend and worsening abdominal pain 6/10 today.  The patient has had a H&P performed within the last 30 days, all history, medications, and exam have been reviewed. The patient denies any interval changes since the H&P with the exception of above.  The patient denies any current chest pain or shortness of breath. She denies any current blood thinner use, denies any known bleeding or clotting disorder, last dose of plavix was 1/21. The patient denies any history of sleep apnea or chronic oxygen use. She has no known complications to sedation.    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 [facility]) 03/11/2019   a.) Stage IA (cT1b, cN0, cM0, G2, ER+, PR+, HER2-); b.) adjuvant endocrine therapy started 06/2019; c.) s/p total mastectomy, SNLB, and breast reconstruction in 04/2019   Liver cancer ([place])    Skin cancer, basal cell    s/p excision   Superior mesenteric artery stenosis (HCC)    T2DM (type 2 diabetes mellitus) ([place])     Past Surgical History:  Procedure Laterality Date   BREAST BIOPSY Left  03/11/2019   Stereotactic CNB; pathology --> ER/PR (-), HER2/neu (-) IMC iwth lobular features   BREAST RECONSTRUCTION WITH PLACEMENT OF TISSUE EXPANDER AND FLEX HD (ACELLULAR HYDRATED DERMIS) Left 05/03/2019   Procedure: LEFT BREAST RECONSTRUCTION WITH PLACEMENT OF TISSUE EXPANDER AND FLEX HD (ACELLULAR HYDRATED DERMIS);  Surgeon: Wallace Going, DO;  Location: [facility];  Service: Plastics;  Laterality: Left;   CHOLECYSTECTOMY     COLONOSCOPY     CORONARY ANGIOPLASTY WITH STENT PLACEMENT Left 04/12/2010   Procedure: LHC with placement of 2.5 x 15 mm Xience V DES x 1 to pLAD; Location: [place]; Surgeon: Isaias Cowman, MD   EXCISION OF BREAST BIOPSY Left 11/17/2020   Procedure: EXCISION OF BREAST BIOPSY;  Surgeon: Herbert Pun, MD;  Location: [facility];  Service: General;  Laterality: Left;   LIPOSUCTION WITH LIPOFILLING Bilateral 01/27/2020   Procedure: LIPOSUCTION WITH LIPOFILLING;  Surgeon: Wallace Going, DO;  Location: [place];  Service: Plastics;  Laterality: Bilateral;  90 min   MASTOPEXY Right 07/01/2019   Procedure: MASTOPEXY;  Surgeon: Wallace Going, DO;  Location: [place];  Service: Plastics;  Laterality: Right;   REMOVAL OF TISSUE EXPANDER AND PLACEMENT OF IMPLANT Left 07/01/2019   Procedure: REMOVAL OF TISSUE EXPANDER AND PLACEMENT OF IMPLANT;  Surgeon: Wallace Going, DO;  Location: [place];  Service: Plastics;  Laterality: Left;  total case 2.5 hours   TOTAL MASTECTOMY Left 05/03/2019   Procedure:  TOTAL MASTECTOMY W/ Sentinel Node;  Surgeon: Herbert Pun, MD;  Location: [facility];  Service: General;  Laterality: Left;   VISCERAL ANGIOGRAPHY N/A 05/01/2020   Procedure: VISCERAL ANGIOGRAPHY;  Surgeon: Algernon Huxley, MD;  Location: [place] [facility];  Service: Cardiovascular;  Laterality: N/A;    Allergies: Penicillins, Exenatide, Orlistat, and Thiazide-type  diuretics  Medications: Prior to Admission medications   Medication Sig Start Date End Date Taking? Authorizing Provider  acetaminophen (TYLENOL) 500 MG tablet Take 1,000 mg by mouth every 6 (six) hours as needed for moderate pain.   Yes [provider]  anastrozole (ARIMIDEX) 1 MG tablet Take 1 tablet (1 mg total) by mouth daily. 01/30/21  Yes Earlie Server, MD  CALCIUM-MAGNESIUM-ZINC PO Take 1 tablet by mouth every evening.    Yes [provider]  clopidogrel (PLAVIX) 75 MG tablet Take 75 mg by mouth daily.   Yes [provider]  JANUMET 50-500 MG tablet Take 1 tablet by mouth 2 (two) times daily. 04/17/18  Yes [provider]  JARDIANCE 25 MG TABS tablet Take 25 mg by mouth daily.  04/06/18  Yes [provider]  lansoprazole (PREVACID) 15 MG capsule Take 15 mg by mouth daily as needed (acid reflux). 04/17/18  Yes [provider]  lovastatin (MEVACOR) 40 MG tablet Take 40 mg by mouth every evening. 04/17/18  Yes [provider]  metFORMIN (GLUCOPHAGE) 500 MG tablet Take 500 mg by mouth 2 (two) times daily.  04/17/18  Yes [provider]  Multiple Vitamin (MULTIVITAMIN) tablet Take 1 tablet by mouth daily.   Yes [provider]  pioglitazone (ACTOS) 30 MG tablet Take 30 mg by mouth daily. 03/16/21  Yes [provider]  glucose blood (CONTOUR NEXT TEST) test strip Use once daily e11.65 04/07/20   [provider]  Vitamin D, Cholecalciferol, 25 MCG (1000 UT) CAPS Take 1 daily Patient not taking: Reported on 04/09/2021 04/02/21   Shary Key, DO     Family History  Problem Relation Age of Onset   CAD Mother    Diabetes Mellitus II Mother    Hypertension Mother    Uterine cancer Mother        dx late 67s   CAD Father    Heart attack Father    Hypertension Father    Diabetes Mellitus II Father        Took insulin   Alcohol abuse Father    Diabetes Mellitus II Sister    Cancer Sister        possible  cervical or uterine   Lung cancer Paternal Aunt    Cancer Paternal Uncle        unk type   Diabetes Other        Almost all sisters are diabetic or pre-diabetic    Cancer Cousin        possible colon/stomach/something in abdomen   Breast cancer Neg Hx     Social History   Socioeconomic History   Marital status: Married    Spouse name: Not on file   Number of children: Not on file   Years of education: Not on file   Highest education level: Not on file  Occupational History   Occupation: Printmaker: [employer]  Tobacco Use   Smoking status: Never   Smokeless tobacco: Never  Vaping Use   Vaping Use: Never used  Substance and Sexual Activity   Alcohol use: Not Currently    Comment:  rarely   Drug use: Never   Sexual activity: Not on file  Other Topics Concern   Not on file  Social History Narrative   reports that she has never smoked. She has never used smokeless tobacco. She reports that she rarely drinks alcohol. She reports that she does not use drugs.      Married to [name] ([name]) [name]    Social Determinants of Health   Financial Resource Strain: Not on file  Food Insecurity: Not on file  Transportation Needs: Not on file  Physical Activity: Not on file  Stress: Not on file  Social Connections: Not on file   Review of Systems: A 12 point ROS discussed and pertinent positives are indicated in the HPI above.  All other systems are negative.  Review of Systems  Vital Signs: There were no vitals taken for this visit.  Physical Exam Constitutional:      Appearance: Normal appearance.     Comments: Slightly jaundice   HENT:     Head: Normocephalic and atraumatic.  Cardiovascular:     Rate and Rhythm: Normal rate and regular rhythm.  Pulmonary:     Effort: Pulmonary effort is normal. No respiratory distress.  Abdominal:     Tenderness: There is abdominal tenderness.  Neurological:     Mental Status: She is alert and oriented to person, place, and  time.   Imaging: DG Chest 2 View  Result Date: 03/31/2021 CLINICAL DATA:  Shortness of breath. EXAM: CHEST - 2 VIEW COMPARISON:  None. FINDINGS: Heart size and mediastinal contours are normal. No pleural effusion or edema identified. No airspace opacities. Visualized osseous structures are unremarkable. IMPRESSION: No active cardiopulmonary abnormalities. Electronically Signed   By: Kerby Moors M.D.   On: 03/31/2021 14:04   CT CHEST W CONTRAST  Result Date: 04/01/2021 CLINICAL DATA:  Evaluate for malignancy. Newly discovered liver metastases. EXAM: CT CHEST WITH CONTRAST TECHNIQUE: Multidetector CT imaging of the chest was performed during intravenous contrast administration. RADIATION DOSE REDUCTION: This exam was performed according to the departmental dose-optimization program which includes automated exposure control, adjustment of the mA and/or kV according to patient size and/or use of iterative reconstruction technique. CONTRAST:  164m OMNIPAQUE IOHEXOL 350 MG/ML SOLN COMPARISON:  CT AP 03/31/2021. FINDINGS: Cardiovascular: No significant vascular findings. Normal heart size. No pericardial effusion. Aortic atherosclerosis and coronary artery calcifications. Mediastinum/Nodes: No enlarged mediastinal, hilar, or axillary lymph nodes. Thyroid gland, trachea, and esophagus demonstrate no significant findings. Lungs/Pleura: Scar like density identified within the superior segment of right lower lobe. No pleural effusion, airspace consolidation, or pneumothorax. No suspicious pulmonary nodule or mass identified. Dependent changes are noted within the posterior lung bases. Upper Abdomen: Multifocal liver metastases are again identified involving both lobes of liver. No acute abnormality noted within the imaged portions of the upper abdomen. Musculoskeletal: Status post left mastectomy with implant reconstruction. There is mild skin thickening with subcutaneous soft tissue stranding within the periphery  of the reconstructed left breast, image 90/3. IMPRESSION: 1. No evidence for primary lung neoplasm or metastatic disease to the chest. 2. Status post left mastectomy with implant reconstruction. There is mild skin thickening with subcutaneous soft tissue stranding within the periphery of the reconstructed left breast. This is nonspecific and may be related to postsurgical change. 3. Multifocal liver metastases. 4. Coronary artery calcifications. 5. Aortic Atherosclerosis (ICD10-I70.0). Electronically Signed   By: TKerby MoorsM.D.   On: 04/01/2021 11:42   CT ABDOMEN PELVIS W CONTRAST  Result Date:  03/31/2021 CLINICAL DATA:  Abdominal pain. EXAM: CT ABDOMEN AND PELVIS WITH CONTRAST TECHNIQUE: Multidetector CT imaging of the abdomen and pelvis was performed using the standard protocol following bolus administration of intravenous contrast. RADIATION DOSE REDUCTION: This exam was performed according to the departmental dose-optimization program which includes automated exposure control, adjustment of the mA and/or kV according to patient size and/or use of iterative reconstruction technique. CONTRAST:  32m OMNIPAQUE IOHEXOL 300 MG/ML  SOLN COMPARISON:  03/02/2020 FINDINGS: Lower chest: Visualized lung bases are clear. Hepatobiliary: Extensive ill-defined low-attenuation lesions are identified throughout both lobes of compatible with metastatic disease. This is a new finding when compared with 03/02/2020. Index lesion within right lobe of liver measures 2.8 by 2.1 cm, image 16/3. Index lesion within segment 4 measures 2.4 x 2.0 cm, image 27/3. Index lesion within segment 2/3 measures 2.4 x 2.3 cm, image 25/3. Status post cholecystectomy. No bile duct dilatation. Pancreas: Unremarkable. No pancreatic ductal dilatation or surrounding inflammatory changes. Spleen: Normal in size without focal abnormality. Adrenals/Urinary Tract: Normal adrenal glands. No kidney mass or hydronephrosis identified. No nephrolithiasis.  Urinary bladder is unremarkable. Stomach/Bowel: Stomach appears within normal limits. No small bowel wall thickening, inflammation, or distension. No pathologic dilatation of the colon. No obstructing colonic mass identified. Vascular/Lymphatic: Aortic atherosclerosis. No aneurysm. No abdominopelvic adenopathy. Reproductive: Small exophytic calcified fibroid arises off the anterior uterus. Uterus is otherwise unremarkable. No adnexal mass. Other: No free fluid or fluid collections. No aggressive lytic or sclerotic bone lesions. Musculoskeletal: No acute or significant osseous findings. IMPRESSION: 1. No acute findings identified within the abdomen or pelvis. 2. Extensive ill-defined low-attenuation lesions throughout both lobes of liver compatible with metastatic disease. This is a new finding when compared with previous CT of the abdomen pelvis from 03/02/2020. No primary neoplasm identified within the abdomen or pelvis. 3. Aortic Atherosclerosis (ICD10-I70.0). Electronically Signed   By: TKerby MoorsM.D.   On: 03/31/2021 14:39    Labs:  CBC: Recent Labs    10/18/20 1301 03/31/21 1252 04/01/21 0440 04/09/21 0812  WBC 5.1 5.5 5.4 6.4  HGB 13.6 13.4 12.7 13.4  HCT 40.6 40.9 38.1 41.0  PLT 216 184 186 308    COAGS: Recent Labs    04/01/21 0440 04/09/21 0812  INR 1.0 1.0  APTT 30  --     BMP: Recent Labs    10/18/20 1301 03/31/21 1228 04/01/21 0440 04/02/21 0118  NA 136 136 137 139  K 4.0 5.4* 3.9 4.0  CL 102 102 103 101  CO2 24 18* 23 23  GLUCOSE 93 151* 103* 161*  BUN 29* _0 CALCIUM 9.1 9.2 8.7* 9.3  CREATININE 0.71 0.95 0.74 0.85  GFRNONAA >60 >60 >60 >60    LIVER FUNCTION TESTS: Recent Labs    10/18/20 1301 03/31/21 1228 04/01/21 0440 04/02/21 0118  BILITOT 0.8 2.5* 1.2 1.2  AST 24 257* 216* 230*  ALT 19 136* 125* 128*  ALKPHOS 55 259* 252* 253*  PROT 7.4 6.7 6.8 6.8  ALBUMIN 4.2 3.4* 3.1* 3.0*    Assessment and Plan: 71year old female with  significant PMHx of breast cancer s/p mastectomy now with liver lesions and request received for image guided biopsy. Patient was seen by our service 04/02/20 with no medical changes except complaints of skin yellowing over the weekend and worsening abdominal pain 6/10 today.   The patient has been NPO, no blood thinners taken- last plavix dose 03/31/20, imaging, labs and vitals have been reviewed.  Risks and benefits of US guided liver lesion biopsy with moderate sedation was discussed with the patient and/or patient's family including, but not limited to bleeding, infection, damage to adjacent structures or low yield requiring additional tests.  All of the questions were answered and there is agreement to proceed.  Consent signed and in chart.  Thank you for this interesting consult.  I greatly enjoyed meeting Tammy Elliott and look forward to participating in their care.  A copy of this report was sent to the requesting provider on this date.  Electronically Signed: Hedy Jacob, PA-C 04/09/2021, 8:50 AM   I spent a total of  5 Minutes in face to face in clinical consultation, greater than 50% of which was counseling/coordinating care for liver lesion.

## 2021-04-16 ENCOUNTER — Other Ambulatory Visit: Payer: Self-pay

## 2021-04-16 ENCOUNTER — Encounter: Payer: Self-pay | Admitting: Oncology

## 2021-04-16 ENCOUNTER — Inpatient Hospital Stay: Payer: Medicare Other | Attending: Oncology

## 2021-04-16 ENCOUNTER — Inpatient Hospital Stay (HOSPITAL_BASED_OUTPATIENT_CLINIC_OR_DEPARTMENT_OTHER): Payer: Medicare Other | Admitting: Oncology

## 2021-04-16 VITALS — BP 133/61 | HR 91 | Temp 97.6°F | Wt 172.0 lb

## 2021-04-16 DIAGNOSIS — C50919 Malignant neoplasm of unspecified site of unspecified female breast: Secondary | ICD-10-CM | POA: Diagnosis not present

## 2021-04-16 DIAGNOSIS — Z79811 Long term (current) use of aromatase inhibitors: Secondary | ICD-10-CM | POA: Diagnosis not present

## 2021-04-16 DIAGNOSIS — C50812 Malignant neoplasm of overlapping sites of left female breast: Secondary | ICD-10-CM | POA: Insufficient documentation

## 2021-04-16 DIAGNOSIS — Z170 Estrogen receptor positive status [ER+]: Secondary | ICD-10-CM | POA: Diagnosis not present

## 2021-04-16 DIAGNOSIS — G893 Neoplasm related pain (acute) (chronic): Secondary | ICD-10-CM | POA: Insufficient documentation

## 2021-04-16 DIAGNOSIS — Z7189 Other specified counseling: Secondary | ICD-10-CM

## 2021-04-16 DIAGNOSIS — C787 Secondary malignant neoplasm of liver and intrahepatic bile duct: Secondary | ICD-10-CM | POA: Diagnosis present

## 2021-04-16 LAB — COMPREHENSIVE METABOLIC PANEL
ALT: 121 U/L — ABNORMAL HIGH (ref 0–44)
AST: 323 U/L — ABNORMAL HIGH (ref 15–41)
Albumin: 2.6 g/dL — ABNORMAL LOW (ref 3.5–5.0)
Alkaline Phosphatase: 636 U/L — ABNORMAL HIGH (ref 38–126)
Anion gap: 9 (ref 5–15)
BUN: 15 mg/dL (ref 8–23)
CO2: 24 mmol/L (ref 22–32)
Calcium: 8.8 mg/dL — ABNORMAL LOW (ref 8.9–10.3)
Chloride: 99 mmol/L (ref 98–111)
Creatinine, Ser: 0.67 mg/dL (ref 0.44–1.00)
GFR, Estimated: >60 mL/min (ref >60)
Glucose, Bld: 138 mg/dL — ABNORMAL HIGH (ref 70–99)
Potassium: 4.2 mmol/L (ref 3.5–5.1)
Sodium: 132 mmol/L — ABNORMAL LOW (ref 135–145)
Total Bilirubin: 7.3 mg/dL — ABNORMAL HIGH (ref 0.3–1.2)
Total Protein: 7.3 g/dL (ref 6.5–8.1)

## 2021-04-16 LAB — CBC WITH DIFFERENTIAL/PLATELET
Abs Immature Granulocytes: 0.08 10*3/uL — ABNORMAL HIGH (ref 0.00–0.07)
Basophils Absolute: 0.1 10*3/uL (ref 0.0–0.1)
Basophils Relative: 1 %
Eosinophils Absolute: 0.1 10*3/uL (ref 0.0–0.5)
Eosinophils Relative: 1 %
HCT: 38.8 % (ref 36.0–46.0)
Hemoglobin: 12.2 g/dL (ref 12.0–15.0)
Immature Granulocytes: 1 %
Lymphocytes Relative: 15 %
Lymphs Abs: 1.2 10*3/uL (ref 0.7–4.0)
MCH: 31.1 pg (ref 26.0–34.0)
MCHC: 31.4 g/dL (ref 30.0–36.0)
MCV: 99 fL (ref 80.0–100.0)
Monocytes Absolute: 0.6 10*3/uL (ref 0.1–1.0)
Monocytes Relative: 7 %
Neutro Abs: 5.9 10*3/uL (ref 1.7–7.7)
Neutrophils Relative %: 75 %
Platelets: 399 10*3/uL (ref 150–400)
RBC: 3.92 MIL/uL (ref 3.87–5.11)
RDW: 15.3 % (ref 11.5–15.5)
WBC: 7.9 10*3/uL (ref 4.0–10.5)
nRBC: 0 % (ref 0.0–0.2)

## 2021-04-16 MED ORDER — MORPHINE SULFATE (CONCENTRATE) 10 MG /0.5 ML PO SOLN: 10.0000 mg | Freq: Four times a day (QID) | ORAL | 0 refills | Status: DC | PRN

## 2021-04-16 MED ORDER — DEXAMETHASONE 4 MG PO TABS: 4.0000 mg | ORAL_TABLET | Freq: Every day | ORAL | 0 refills | Status: AC

## 2021-04-16 NOTE — Progress Notes (Signed)
Hematology/Oncology Progress note [phone] [phone]      Patient Care Team: Baxter Hire, MD as PCP - General (Internal Medicine) Leonie Man, MD as PCP - Cardiology (Cardiology) Earlie Server, MD as Consulting Physician (Oncology) Herbert Pun, MD as Consulting Physician (General Surgery) Dillingham, Loel Lofty, DO as Attending Physician (Plastic Surgery) Rico Junker, RN as Registered Nurse Theodore Demark, RN as Registered Nurse  REFERRING PROVIDER: Baxter Hire, MD  CHIEF COMPLAINTS/REASON FOR VISIT:  Follow up of breast cancer  HISTORY OF PRESENTING ILLNESS:   Tammy Elliott is a  71 y.o.  female with PMH listed below was seen in consultation at the request of  Baxter Hire, MD  for evaluation of breast cancer Patient had screening mammogram done on 02/16/2019 which showed left breast asymmetry with distortion in the upper outer quadrant. 03/03/2019 left diagnostic mammogram showed small asymmetry noted on the screening study appears to be a small ill-defined mass with associated subtle distortion.  1:30 position of the left breast.  7 cm from the nipple. Sonographic evaluation of the left axillary showed no enlarged or abnormal lymph nodes. Patient underwent stereotactic core needle biopsy of the mass. 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  Grade 2, ER > 90%, PR 1 to 10%, HER-2 negative. Patient denies any family history of breast cancer. Menarche 71 years old, menopause 71 years old Remote use of OCP for about 10 to 15 years, Never used hormone replacement therapy. Denies any history of radiation to the chest.  03/13/2019 Breast MRI bilateral showed numerous suspicious enhancing masses through out the upper inner and upper outer left breast concerning for multicentric diseases. Suspicious findings span greater than 6cm in both AP and  transverse dimensions.  MRI biopsy of left breast central middle and central anterior mass showed invasive mammary carcinoma, grade 1/2.  # 05/03/2019 patient underwent left total mastectomy, and SLNB.  Pathology showed invasive lobular carcinoma, multifocal, LCIS, 1 lymph node negative for malignancy.  Stage IA, pT2(m) pN0 (sn), Grade 2, Margins are not involved by invasive carcinoma.  ER 95%, PR 30%, HER2 IHC 2+ equivocal, FISH negative.   OncotypeDX recurrence score is 19, patient is postmenopausal, chemotherapy benefit is <1%, I will not offer adjuvant chemotherapy.  Patient also has an resection of skin lesion close to her left breast.-SCC  April 2021 started on Armidex 51m daily.  Patient underwent left breast reconstruction  INTERVAL HISTORY Tammy SHELLERis a 71y.o. female who has above history reviewed by me today presents for follow up visit for metastatic breast cancer.  03/31/2021 - 04/02/2021, [facility] of right upper quadrant pain, weakness, shortness of breath.  Patient reports that she has experienced this symptom since November 2022.  Patient had a CT angiogram done during admission which showed new extensive ill-defined low-attenuation lesions throughout both lobes of the liver.  Patient was on Plavix which needs to be done hold for 5 days prior to the procedure. Patient was discharged and had outpatient ultrasound-guided liver biopsy on 04/09/2021.  Patient present to discuss results.  Patient takes Tylenol for pain.  Request some stronger medication for pain control.   Review of Systems  Constitutional:  Negative for appetite change, chills, fatigue and fever.  HENT:   Negative for hearing loss and voice change.   Eyes:  Negative for eye problems.  Respiratory:  Negative for chest tightness and cough.   Cardiovascular:  Negative for chest pain.  Gastrointestinal:  Negative for abdominal  distention, abdominal pain and blood in stool.  Endocrine: Negative for hot flashes.  Genitourinary:  Negative for difficulty urinating and frequency.   Musculoskeletal:  Negative for arthralgias.  Skin:  Negative for itching and rash.  Neurological:  Negative for extremity weakness.  Hematological:  Negative for adenopathy.  Psychiatric/Behavioral:  Negative for confusion.    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 ([place]) 03/11/2019   a.) Stage IA (cT1b, cN0, cM0, G2, ER+, PR+, HER2-); b.) adjuvant endocrine therapy started 06/2019; c.) s/p total mastectomy, SNLB, and breast reconstruction in 04/2019   Liver cancer ([place])    Skin cancer, basal cell    s/p excision   Superior mesenteric artery stenosis (HCC)    T2DM (type 2 diabetes mellitus) ([place])     SURGICAL HISTORY: Past Surgical History:  Procedure Laterality Date   BREAST BIOPSY Left 03/11/2019   Stereotactic CNB; pathology --> ER/PR (-), HER2/neu (-) IMC iwth lobular features   BREAST RECONSTRUCTION WITH PLACEMENT OF TISSUE EXPANDER AND FLEX HD (ACELLULAR HYDRATED DERMIS) Left 05/03/2019   Procedure: LEFT BREAST RECONSTRUCTION WITH PLACEMENT OF TISSUE EXPANDER AND FLEX HD (ACELLULAR HYDRATED DERMIS);  Surgeon: Wallace Going, DO;  Location: [facility];  Service: Plastics;  Laterality: Left;   CHOLECYSTECTOMY     COLONOSCOPY     CORONARY ANGIOPLASTY WITH STENT PLACEMENT Left 04/12/2010   Procedure: LHC with placement of 2.5 x 15 mm Xience V DES x 1 to pLAD; Location: [place]; Surgeon: Isaias Cowman, MD   EXCISION  OF BREAST BIOPSY Left 11/17/2020   Procedure: EXCISION OF BREAST BIOPSY;  Surgeon: Herbert Pun, MD;  Location: [facility];  Service: General;  Laterality: Left;   LIPOSUCTION WITH LIPOFILLING Bilateral 01/27/2020   Procedure: LIPOSUCTION WITH LIPOFILLING;  Surgeon: Wallace Going, DO;  Location: [place];  Service: Plastics;  Laterality: Bilateral;  90 min   MASTOPEXY Right 07/01/2019   Procedure: MASTOPEXY;  Surgeon: Wallace Going, DO;  Location: [place];  Service: Plastics;  Laterality: Right;   REMOVAL OF TISSUE EXPANDER AND PLACEMENT OF IMPLANT Left 07/01/2019   Procedure: REMOVAL OF TISSUE EXPANDER AND PLACEMENT OF IMPLANT;  Surgeon: Wallace Going, DO;  Location: [place];  Service: Plastics;  Laterality: Left;  total case 2.5 hours   TOTAL MASTECTOMY Left 05/03/2019   Procedure: TOTAL MASTECTOMY W/ Sentinel Node;  Surgeon: Herbert Pun, MD;  Location: [facility];  Service: General;  Laterality: Left;   VISCERAL ANGIOGRAPHY N/A 05/01/2020   Procedure: VISCERAL ANGIOGRAPHY;  Surgeon: Algernon Huxley, MD;  Location: [place] [facility];  Service: Cardiovascular;  Laterality: N/A;    SOCIAL HISTORY: Social History   Socioeconomic History   Marital status: Married    Spouse name: Not on file   Number of children: Not on file   Years of education: Not on file   Highest  education level: Not on file  Occupational History   Occupation: Printmaker: [employer]  Tobacco Use   Smoking status: Never   Smokeless tobacco: Never  Vaping Use   Vaping Use: Never used  Substance and Sexual Activity   Alcohol use: Not Currently    Comment: rarely   Drug use: Never   Sexual activity: Not on file  Other Topics Concern   Not on file  Social History Narrative   reports that she has never smoked. She has never used smokeless tobacco. She reports that she rarely drinks alcohol. She reports that she does not use  drugs.      Married to [name]    Social Determinants of Health   Financial Resource Strain: Not on file  Food Insecurity: Not on file  Transportation Needs: Not on file  Physical Activity: Not on file  Stress: Not on file  Social Connections: Not on file  Intimate Partner Violence: Not on file    FAMILY HISTORY: Family History  Problem Relation Age of Onset   CAD Mother    Diabetes Mellitus II Mother    Hypertension Mother    Uterine cancer Mother        dx late 51s   CAD Father    Heart attack Father    Hypertension Father    Diabetes Mellitus II Father        Took insulin   Alcohol abuse Father    Diabetes Mellitus II Sister    Cancer Sister        possible cervical or uterine   Lung cancer Paternal Aunt    Cancer Paternal Uncle        unk type   Diabetes Other        Almost all sisters are diabetic or pre-diabetic    Cancer Cousin        possible colon/stomach/something in abdomen   Breast cancer Neg Hx     ALLERGIES:  is allergic to penicillins, exenatide, orlistat, and thiazide-type diuretics.  MEDICATIONS:  Current Outpatient Medications  Medication Sig Dispense Refill   acetaminophen (TYLENOL) 500 MG tablet Take 1,000 mg by mouth every 6 (six) hours as needed for moderate pain.     anastrozole (ARIMIDEX) 1 MG tablet Take 1 tablet (1 mg total) by mouth daily. 90 tablet 1   CALCIUM-MAGNESIUM-ZINC PO Take 1 tablet by mouth every evening.      clopidogrel (PLAVIX) 75 MG tablet Take 75 mg by mouth daily.     dexamethasone (DECADRON) 4 MG tablet Take 1 tablet (4 mg total) by mouth daily. 10 tablet 0   glucose blood (CONTOUR NEXT TEST) test strip Use once daily e11.65     JANUMET 50-500 MG tablet Take 1 tablet by mouth 2 (two) times daily.     JARDIANCE 25 MG TABS tablet Take 25 mg by mouth daily.      lansoprazole (PREVACID) 15 MG capsule Take 15 mg by mouth daily as needed (acid reflux).     lovastatin (MEVACOR) 40 MG tablet Take 40 mg by mouth  every evening.     metFORMIN (GLUCOPHAGE) 500 MG tablet Take 500 mg by mouth 2 (two) times daily.      Morphine Sulfate (MORPHINE CONCENTRATE) 10 mg / 0.5 ml concentrated solution Take 0.5 mLs (10 mg total) by mouth every 6 (six) hours as needed for severe pain. 30 mL 0   Multiple Vitamin (MULTIVITAMIN) tablet Take 1 tablet by mouth daily.  pioglitazone (ACTOS) 30 MG tablet Take 30 mg by mouth daily.     Vitamin D, Cholecalciferol, 25 MCG (1000 UT) CAPS Take 1 daily 60 capsule 0   No current facility-administered medications for this visit.     PHYSICAL EXAMINATION: ECOG PERFORMANCE STATUS: 0 - Asymptomatic Vitals:   04/16/21 0954  BP: 133/61  Pulse: 91  Temp: 97.6 F (36.4 C)   Filed Weights   04/16/21 0954  Weight: 172 lb (78 kg)    Physical Exam Constitutional:      General: She is not in acute distress. HENT:     Head: Normocephalic and atraumatic.  Eyes:     General: No scleral icterus.    Pupils: Pupils are equal, round, and reactive to light.  Cardiovascular:     Rate and Rhythm: Normal rate and regular rhythm.     Heart sounds: Normal heart sounds.  Pulmonary:     Effort: Pulmonary effort is normal. No respiratory distress.     Breath sounds: No wheezing.  Abdominal:     General: Bowel sounds are normal. There is no distension.     Palpations: Abdomen is soft. There is no mass.     Tenderness: There is no abdominal tenderness.  Musculoskeletal:        General: No deformity. Normal range of motion.     Cervical back: Normal range of motion and neck supple.  Skin:    General: Skin is warm and dry.     Findings: No erythema or rash.  Neurological:     Mental Status: She is alert and oriented to person, place, and time. Mental status is at baseline.     Cranial Nerves: No cranial nerve deficit.     Coordination: Coordination normal.  Psychiatric:     Comments: Anxious   Breast exam was performed in seated and lying down position. Patient is status post  lumpectomy /reconstruction. There is a very small but palpable nodule, mobile, 0.5 cm, 12:00,  above her upper age of the left breast implant No palpable mass in right breast.   LABORATORY DATA:  I have reviewed the data as listed Lab Results  Component Value Date   WBC 7.9 04/16/2021   HGB 12.2 04/16/2021   HCT 38.8 04/16/2021   MCV 99.0 04/16/2021   PLT 399 04/16/2021   Recent Labs    04/01/21 0440 04/02/21 0118 04/16/21 0941  NA 137 139 132*  K 3.9 4.0 4.2  CL 103 101 99  CO2 '23 23 24  ' GLUCOSE 103* 161* 138*  BUN '13 14 15  ' CREATININE 0.74 0.85 0.67  CALCIUM 8.7* 9.3 8.8*  GFRNONAA >60 >60 >60  PROT 6.8 6.8 7.3  ALBUMIN 3.1* 3.0* 2.6*  AST 216* 230* 323*  ALT 125* 128* 121*  ALKPHOS 252* 253* 636*  BILITOT 1.2 1.2 7.3*    Iron/TIBC/Ferritin/ %Sat No results found for: IRON, TIBC, FERRITIN, IRONPCTSAT    RADIOGRAPHIC STUDIES: I have personally reviewed the radiological images as listed and agreed with the findings in the report. DG Chest 2 View  Result Date: 03/31/2021 CLINICAL DATA:  Shortness of breath. EXAM: CHEST - 2 VIEW COMPARISON:  None. FINDINGS: Heart size and mediastinal contours are normal. No pleural effusion or edema identified. No airspace opacities. Visualized osseous structures are unremarkable. IMPRESSION: No active cardiopulmonary abnormalities. Electronically Signed   By: Kerby Moors M.D.   On: 03/31/2021 14:04   CT CHEST W CONTRAST  Result Date: 04/01/2021 CLINICAL DATA:  Evaluate for  malignancy. Newly discovered liver metastases. EXAM: CT CHEST WITH CONTRAST TECHNIQUE: Multidetector CT imaging of the chest was performed during intravenous contrast administration. RADIATION DOSE REDUCTION: This exam was performed according to the departmental dose-optimization program which includes automated exposure control, adjustment of the mA and/or kV according to patient size and/or use of iterative reconstruction technique. CONTRAST:  114m OMNIPAQUE  IOHEXOL 350 MG/ML SOLN COMPARISON:  CT AP 03/31/2021. FINDINGS: Cardiovascular: No significant vascular findings. Normal heart size. No pericardial effusion. Aortic atherosclerosis and coronary artery calcifications. Mediastinum/Nodes: No enlarged mediastinal, hilar, or axillary lymph nodes. Thyroid gland, trachea, and esophagus demonstrate no significant findings. Lungs/Pleura: Scar like density identified within the superior segment of right lower lobe. No pleural effusion, airspace consolidation, or pneumothorax. No suspicious pulmonary nodule or mass identified. Dependent changes are noted within the posterior lung bases. Upper Abdomen: Multifocal liver metastases are again identified involving both lobes of liver. No acute abnormality noted within the imaged portions of the upper abdomen. Musculoskeletal: Status post left mastectomy with implant reconstruction. There is mild skin thickening with subcutaneous soft tissue stranding within the periphery of the reconstructed left breast, image 90/3. IMPRESSION: 1. No evidence for primary lung neoplasm or metastatic disease to the chest. 2. Status post left mastectomy with implant reconstruction. There is mild skin thickening with subcutaneous soft tissue stranding within the periphery of the reconstructed left breast. This is nonspecific and may be related to postsurgical change. 3. Multifocal liver metastases. 4. Coronary artery calcifications. 5. Aortic Atherosclerosis (ICD10-I70.0). Electronically Signed   By: TKerby MoorsM.D.   On: 04/01/2021 11:42   CT ABDOMEN PELVIS W CONTRAST  Result Date: 03/31/2021 CLINICAL DATA:  Abdominal pain. EXAM: CT ABDOMEN AND PELVIS WITH CONTRAST TECHNIQUE: Multidetector CT imaging of the abdomen and pelvis was performed using the standard protocol following bolus administration of intravenous contrast. RADIATION DOSE REDUCTION: This exam was performed according to the departmental dose-optimization program which includes  automated exposure control, adjustment of the mA and/or kV according to patient size and/or use of iterative reconstruction technique. CONTRAST:  764mOMNIPAQUE IOHEXOL 300 MG/ML  SOLN COMPARISON:  03/02/2020 FINDINGS: Lower chest: Visualized lung bases are clear. Hepatobiliary: Extensive ill-defined low-attenuation lesions are identified throughout both lobes of compatible with metastatic disease. This is a new finding when compared with 03/02/2020. Index lesion within right lobe of liver measures 2.8 by 2.1 cm, image 16/3. Index lesion within segment 4 measures 2.4 x 2.0 cm, image 27/3. Index lesion within segment 2/3 measures 2.4 x 2.3 cm, image 25/3. Status post cholecystectomy. No bile duct dilatation. Pancreas: Unremarkable. No pancreatic ductal dilatation or surrounding inflammatory changes. Spleen: Normal in size without focal abnormality. Adrenals/Urinary Tract: Normal adrenal glands. No kidney mass or hydronephrosis identified. No nephrolithiasis. Urinary bladder is unremarkable. Stomach/Bowel: Stomach appears within normal limits. No small bowel wall thickening, inflammation, or distension. No pathologic dilatation of the colon. No obstructing colonic mass identified. Vascular/Lymphatic: Aortic atherosclerosis. No aneurysm. No abdominopelvic adenopathy. Reproductive: Small exophytic calcified fibroid arises off the anterior uterus. Uterus is otherwise unremarkable. No adnexal mass. Other: No free fluid or fluid collections. No aggressive lytic or sclerotic bone lesions. Musculoskeletal: No acute or significant osseous findings. IMPRESSION: 1. No acute findings identified within the abdomen or pelvis. 2. Extensive ill-defined low-attenuation lesions throughout both lobes of liver compatible with metastatic disease. This is a new finding when compared with previous CT of the abdomen pelvis from 03/02/2020. No primary neoplasm identified within the abdomen or pelvis. 3. Aortic Atherosclerosis (ICD10-I70.0).  Electronically Signed   By: Kerby Moors M.D.   On: 03/31/2021 14:39   US BIOPSY (LIVER)  Result Date: 04/09/2021 INDICATION: Liver lesions, concern for metastatic breast cancer EXAM: Ultrasound-guided core needle biopsy of liver lesion MEDICATIONS: None. ANESTHESIA/SEDATION: Moderate (conscious) sedation was employed during this procedure. A total of Versed 2 mg and Fentanyl 100 mcg was administered intravenously. Moderate Sedation Time: 12 minutes. The patient's level of consciousness and vital signs were monitored continuously by radiology nursing throughout the procedure under my direct supervision. FLUOROSCOPY TIME:  N/a COMPLICATIONS: None immediate. PROCEDURE: Informed written consent was obtained from the patient after a thorough discussion of the procedural risks, benefits and alternatives. All questions were addressed. Maximal Sterile Barrier Technique was utilized including caps, mask, sterile gowns, sterile gloves, sterile drape, hand hygiene and skin antiseptic. A timeout was performed prior to the initiation of the procedure. The patient was placed supine on the exam table. Limited ultrasound of the liver was performed, demonstrating numerous lesions within the right and left hepatic lobes. It appropriate lesion was identified in the left hepatic lobe amenable for percutaneous biopsy. Skin entry site was marked, the overlying skin was prepped and draped in the standard sterile fashion. Local analgesia was obtained with 1% lidocaine. Using ultrasound guidance, a 17 gauge introducer needle was advanced towards the target lesion in the left hepatic lobe. Subsequently, core needle biopsy was performed using a 18 gauge core biopsy device x3 passes. Adequate tissue samples were obtained, and submitted in formalin to pathology for further handling. Limited postprocedure imaging demonstrated no hematoma or complicating feature. A clean dressing was placed after manual hemostasis. The patient tolerated  the procedure well without immediate complication. IMPRESSION: Successful ultrasound-guided core needle biopsy of focal lesion in the left hepatic lobe. Electronically Signed   By: Albin Felling M.D.   On: 04/09/2021 11:07   MM 3D SCREEN BREAST UNI RIGHT  Result Date: 02/27/2021 CLINICAL DATA:  Screening. EXAM: DIGITAL SCREENING UNILATERAL RIGHT MAMMOGRAM WITH CAD AND TOMOSYNTHESIS TECHNIQUE: Right screening digital craniocaudal and mediolateral oblique mammograms were obtained. Right screening digital breast tomosynthesis was performed. The images were evaluated with computer-aided detection. COMPARISON:  Previous exam(s). ACR Breast Density Category c: The breast tissue is heterogeneously dense, which may obscure small masses. FINDINGS: The patient has had a left mastectomy. There are no findings suspicious for malignancy. IMPRESSION: No mammographic evidence of malignancy. A result letter of this screening mammogram will be mailed directly to the patient. RECOMMENDATION: Screening mammogram in one year.  (Code:[id number]) BI-RADS CATEGORY  1: Negative. Electronically Signed   By: Kristopher Oppenheim M.D.   On: 02/27/2021 13:42       ASSESSMENT & PLAN:  1. Liver metastasis ([place])   2. Metastatic breast cancer ([place])   3. Goals of care, counseling/discussion   4. Hyperbilirubinemia    Cancer Staging  Malignant neoplasm of upper-outer quadrant of left breast in female, estrogen receptor positive ([place]) Staging form: Breast, AJCC 8th Edition - Clinical: Stage IA (cT1b, cN0, cM0, G2, ER+, PR+, HER2-) - Signed by Earlie Server, MD on 03/23/2019 - Pathologic: Stage IA (pT2, pN0, cM0, G2, ER+, PR+, HER2-, Oncotype DX score: 19) - Signed by Earlie Server, MD on 05/29/2019  Metastatic breast cancer [facility]) Staging form: Breast, AJCC 8th Edition - Clinical stage from 04/16/2021: Stage IV (rcTX, cNX, pM1, GX, ER: Unknown, PR: Unknown, HER2: Unknown) - Signed by Earlie Server, MD on 04/16/2021  1. Liver metastasis ([place])   2.  Metastatic breast cancer ([place])  3. Goals of care, counseling/discussion   4. Hyperbilirubinemia   5. Neoplasm related pain    Cancer Staging  Malignant neoplasm of upper-outer quadrant of left breast in female, estrogen receptor positive ([place]) Staging form: Breast, AJCC 8th Edition - Clinical: Stage IA (cT1b, cN0, cM0, G2, ER+, PR+, HER2-) - Signed by Earlie Server, MD on 03/23/2019 - Pathologic: Stage IA (pT2, pN0, cM0, G2, ER+, PR+, HER2-, Oncotype DX score: 19) - Signed by Earlie Server, MD on 05/29/2019  Metastatic breast cancer [facility]) Staging form: Breast, AJCC 8th Edition - Clinical stage from 04/16/2021: Stage IV (rcTX, cNX, pM1, GX, ER: Unknown, PR: Unknown, HER2: Unknown) - Signed by Earlie Server, MD on 04/16/2021   #History of left breast multifocal stage IA breast cancer,pT42mpN0 status post total mastectomy Now with extensive liver metastasis. Ultrasound-guided liver biopsy showed positive for metastatic carcinoma, compatible with mammary primary.  ER/PR/HER2 status pending. Today's labs also showed acute increase of total bilirubin to 7.  Will obtain stat ultrasound right upper quadrant. Most likely hyperbilirubinemia is due to intrahepatic obstruction.  Rule out any stentable or drainable process. Stop Arimidex.  Diagnosis of stage IV breast cancer was discussed with the patient.  Patient understands that her condition is not curable. Prognosis is poor.  The goal of treatment which is to palliate disease, disease related symptoms, improve quality of life and hopefully prolong life was highlighted in our discussion.  Patient is in visceral crisis, recommend chemotherapy treatments.   Chemotherapy options are limited due to her bilirubin level. Recommend gemcitabine 2-3 weeks on 1 week off Xeloda could be considered as well.- discussed with pharmacy, it takes longer to obtain coverage for xeloda.   I explained to the patient the risks and benefits of chemotherapy including all but not limited to  hair loss, mouth sore, nausea, vomiting, diarrhea, low blood counts, bleeding, and risk of life threatening infection and even death, secondary malignancy etc.  .Patient voices understanding and willing to proceed chemotherapy this week.    # Chemotherapy education; Antiemetics-Zofran and Compazine; sent to pharmacy # establish care with palliative care service.  # Neoplasm pain, recommend Morphine 117mQ6 hours as needed for pain.  # Hypoglycemia, history of DM Stop Actos, Janumet, Jardiance. Ok to continue metformin 50041mID.  Monitor blood glucose at home. Follow up with endocrinology  Supportive care measures are necessary for patient well-being and will be provided as necessary. We spent sufficient time to discuss many aspect of care, questions were answered to patient's satisfaction.   Orders Placed This Encounter  Procedures   US [employer] RUQ (LIVER/GB)    Standing Status:   Future    Standing Expiration Date:   04/16/2022    Order Specific Question:   Reason for Exam (SYMPTOM  OR DIAGNOSIS REQUIRED)    Answer:   hyperbilirubinemia    Order Specific Question:   Preferred imaging location?    Answer:   [facility]    Order Specific Question:   Call Results- Best Contact Number?    Answer:  :   [phone] not hold    All questions were answered. The patient knows to call the clinic with any problems questions or concerns. Follow-up in 1 week.   ZhoEarlie ServerD, PhD Hematology Oncology  04/16/2021

## 2021-04-17 ENCOUNTER — Other Ambulatory Visit (HOSPITAL_COMMUNITY): Payer: Self-pay

## 2021-04-17 ENCOUNTER — Ambulatory Visit
Admission: RE | Admit: 2021-04-17 | Discharge: 2021-04-17 | Disposition: A | Discharge status: Home / Self Care | Payer: Medicare Other | Source: Ambulatory Visit | Attending: Oncology | Admitting: Oncology

## 2021-04-17 ENCOUNTER — Other Ambulatory Visit: Payer: Self-pay

## 2021-04-17 ENCOUNTER — Ambulatory Visit: Payer: Medicare Other | Admitting: Plastic Surgery

## 2021-04-17 ENCOUNTER — Encounter: Payer: Self-pay | Admitting: Oncology

## 2021-04-17 DIAGNOSIS — R509 Fever, unspecified: Secondary | ICD-10-CM | POA: Diagnosis not present

## 2021-04-17 DIAGNOSIS — A4101 Sepsis due to Methicillin susceptible Staphylococcus aureus: Secondary | ICD-10-CM | POA: Diagnosis not present

## 2021-04-17 DIAGNOSIS — C787 Secondary malignant neoplasm of liver and intrahepatic bile duct: Secondary | ICD-10-CM

## 2021-04-17 MED ORDER — PROCHLORPERAZINE MALEATE 10 MG PO TABS: 10.0000 mg | ORAL_TABLET | Freq: Four times a day (QID) | ORAL | 1 refills | Status: DC | PRN

## 2021-04-17 MED ORDER — ONDANSETRON HCL 8 MG PO TABS: 8.0000 mg | ORAL_TABLET | Freq: Two times a day (BID) | ORAL | 1 refills | Status: AC | PRN

## 2021-04-17 NOTE — Progress Notes (Signed)
START OFF PATHWAY REGIMEN - Breast   [id number]:Gemcitabine 1,000 mg/m2 D1, 8  q21 Days:   A cycle is every 21 days:     Gemcitabine   **Always confirm dose/schedule in your pharmacy ordering system**  Patient Characteristics: Distant Metastases or Locoregional Recurrent Disease - Unresected or Locally Advanced Unresectable Disease Progressing after Neoadjuvant and Local Therapies, HER2 Low/Negative/Unknown, ER Negative/Unknown, Chemotherapy, HER2 Negative/Unknown, First Line,  ER Unknown Therapeutic Status: Distant Metastases HER2 Status: Unknown ER Status: Unknown PR Status: Unknown Therapy Approach Indicated: Standard Chemotherapy/Endocrine Therapy Line of Therapy: First Line Intent of Therapy: Non-Curative / Palliative Intent, Discussed with Patient

## 2021-04-19 ENCOUNTER — Inpatient Hospital Stay: Payer: Medicare Other

## 2021-04-19 ENCOUNTER — Other Ambulatory Visit: Payer: Self-pay

## 2021-04-19 ENCOUNTER — Other Ambulatory Visit: Payer: Self-pay | Admitting: Oncology

## 2021-04-19 MED ORDER — MORPHINE SULFATE (CONCENTRATE) 10 MG /0.5 ML PO SOLN: 10.0000 mg | Freq: Four times a day (QID) | ORAL | 0 refills | Status: AC | PRN

## 2021-04-20 ENCOUNTER — Inpatient Hospital Stay: Payer: Medicare Other

## 2021-04-20 ENCOUNTER — Emergency Department: Payer: Medicare Other

## 2021-04-20 ENCOUNTER — Telehealth: Payer: Self-pay

## 2021-04-20 ENCOUNTER — Other Ambulatory Visit: Payer: Self-pay

## 2021-04-20 ENCOUNTER — Encounter: Payer: Self-pay | Admitting: Intensive Care

## 2021-04-20 ENCOUNTER — Inpatient Hospital Stay
Admission: EM | Admit: 2021-04-20 | Discharge: 2021-04-26 | DRG: 871 | Disposition: A | Discharge status: Hospice | Payer: Medicare Other | Attending: Internal Medicine | Admitting: Internal Medicine

## 2021-04-20 ENCOUNTER — Other Ambulatory Visit: Payer: Medicare Other

## 2021-04-20 ENCOUNTER — Ambulatory Visit: Payer: Medicare Other | Admitting: Oncology

## 2021-04-20 DIAGNOSIS — N390 Urinary tract infection, site not specified: Secondary | ICD-10-CM | POA: Diagnosis present

## 2021-04-20 DIAGNOSIS — R17 Unspecified jaundice: Secondary | ICD-10-CM | POA: Diagnosis not present

## 2021-04-20 DIAGNOSIS — R7989 Other specified abnormal findings of blood chemistry: Secondary | ICD-10-CM | POA: Diagnosis present

## 2021-04-20 DIAGNOSIS — R6510 Systemic inflammatory response syndrome (SIRS) of non-infectious origin without acute organ dysfunction: Secondary | ICD-10-CM | POA: Diagnosis not present

## 2021-04-20 DIAGNOSIS — Z66 Do not resuscitate: Secondary | ICD-10-CM | POA: Diagnosis present

## 2021-04-20 DIAGNOSIS — C787 Secondary malignant neoplasm of liver and intrahepatic bile duct: Secondary | ICD-10-CM | POA: Diagnosis not present

## 2021-04-20 DIAGNOSIS — R509 Fever, unspecified: Secondary | ICD-10-CM | POA: Diagnosis present

## 2021-04-20 DIAGNOSIS — Z853 Personal history of malignant neoplasm of breast: Secondary | ICD-10-CM | POA: Diagnosis not present

## 2021-04-20 DIAGNOSIS — I2510 Atherosclerotic heart disease of native coronary artery without angina pectoris: Secondary | ICD-10-CM | POA: Diagnosis present

## 2021-04-20 DIAGNOSIS — Z880 Allergy status to penicillin: Secondary | ICD-10-CM | POA: Diagnosis not present

## 2021-04-20 DIAGNOSIS — E7800 Pure hypercholesterolemia, unspecified: Secondary | ICD-10-CM | POA: Diagnosis present

## 2021-04-20 DIAGNOSIS — N179 Acute kidney failure, unspecified: Secondary | ICD-10-CM | POA: Diagnosis not present

## 2021-04-20 DIAGNOSIS — K7682 Hepatic encephalopathy: Secondary | ICD-10-CM

## 2021-04-20 DIAGNOSIS — E8809 Other disorders of plasma-protein metabolism, not elsewhere classified: Secondary | ICD-10-CM | POA: Diagnosis present

## 2021-04-20 DIAGNOSIS — Z20822 Contact with and (suspected) exposure to covid-19: Secondary | ICD-10-CM | POA: Diagnosis present

## 2021-04-20 DIAGNOSIS — Z7984 Long term (current) use of oral hypoglycemic drugs: Secondary | ICD-10-CM

## 2021-04-20 DIAGNOSIS — K551 Chronic vascular disorders of intestine: Secondary | ICD-10-CM | POA: Diagnosis present

## 2021-04-20 DIAGNOSIS — C50919 Malignant neoplasm of unspecified site of unspecified female breast: Secondary | ICD-10-CM | POA: Diagnosis not present

## 2021-04-20 DIAGNOSIS — Z7189 Other specified counseling: Secondary | ICD-10-CM

## 2021-04-20 DIAGNOSIS — K767 Hepatorenal syndrome: Secondary | ICD-10-CM | POA: Diagnosis present

## 2021-04-20 DIAGNOSIS — Z833 Family history of diabetes mellitus: Secondary | ICD-10-CM

## 2021-04-20 DIAGNOSIS — Z79811 Long term (current) use of aromatase inhibitors: Secondary | ICD-10-CM

## 2021-04-20 DIAGNOSIS — E871 Hypo-osmolality and hyponatremia: Secondary | ICD-10-CM | POA: Diagnosis present

## 2021-04-20 DIAGNOSIS — Z515 Encounter for palliative care: Secondary | ICD-10-CM | POA: Diagnosis not present

## 2021-04-20 DIAGNOSIS — Z85828 Personal history of other malignant neoplasm of skin: Secondary | ICD-10-CM | POA: Diagnosis not present

## 2021-04-20 DIAGNOSIS — Z8049 Family history of malignant neoplasm of other genital organs: Secondary | ICD-10-CM

## 2021-04-20 DIAGNOSIS — D689 Coagulation defect, unspecified: Secondary | ICD-10-CM | POA: Diagnosis not present

## 2021-04-20 DIAGNOSIS — I700 Atherosclerosis of aorta: Secondary | ICD-10-CM | POA: Diagnosis present

## 2021-04-20 DIAGNOSIS — Z888 Allergy status to other drugs, medicaments and biological substances status: Secondary | ICD-10-CM | POA: Diagnosis not present

## 2021-04-20 DIAGNOSIS — A4101 Sepsis due to Methicillin susceptible Staphylococcus aureus: Principal | ICD-10-CM | POA: Diagnosis present

## 2021-04-20 DIAGNOSIS — Z955 Presence of coronary angioplasty implant and graft: Secondary | ICD-10-CM

## 2021-04-20 DIAGNOSIS — E872 Acidosis, unspecified: Secondary | ICD-10-CM | POA: Diagnosis not present

## 2021-04-20 DIAGNOSIS — R188 Other ascites: Secondary | ICD-10-CM | POA: Diagnosis not present

## 2021-04-20 DIAGNOSIS — Z79899 Other long term (current) drug therapy: Secondary | ICD-10-CM

## 2021-04-20 DIAGNOSIS — Z9012 Acquired absence of left breast and nipple: Secondary | ICD-10-CM

## 2021-04-20 DIAGNOSIS — R6520 Severe sepsis without septic shock: Secondary | ICD-10-CM | POA: Diagnosis present

## 2021-04-20 DIAGNOSIS — K219 Gastro-esophageal reflux disease without esophagitis: Secondary | ICD-10-CM | POA: Diagnosis present

## 2021-04-20 DIAGNOSIS — Z8249 Family history of ischemic heart disease and other diseases of the circulatory system: Secondary | ICD-10-CM

## 2021-04-20 DIAGNOSIS — E118 Type 2 diabetes mellitus with unspecified complications: Secondary | ICD-10-CM | POA: Diagnosis present

## 2021-04-20 DIAGNOSIS — Z801 Family history of malignant neoplasm of trachea, bronchus and lung: Secondary | ICD-10-CM

## 2021-04-20 DIAGNOSIS — Z7902 Long term (current) use of antithrombotics/antiplatelets: Secondary | ICD-10-CM

## 2021-04-20 DIAGNOSIS — E785 Hyperlipidemia, unspecified: Secondary | ICD-10-CM | POA: Diagnosis not present

## 2021-04-20 DIAGNOSIS — Z811 Family history of alcohol abuse and dependence: Secondary | ICD-10-CM

## 2021-04-20 DIAGNOSIS — A419 Sepsis, unspecified organism: Secondary | ICD-10-CM

## 2021-04-20 DIAGNOSIS — E11649 Type 2 diabetes mellitus with hypoglycemia without coma: Secondary | ICD-10-CM | POA: Diagnosis not present

## 2021-04-20 LAB — COMPREHENSIVE METABOLIC PANEL
ALT: 132 U/L — ABNORMAL HIGH (ref 0–44)
AST: 427 U/L — ABNORMAL HIGH (ref 15–41)
Albumin: 2 g/dL — ABNORMAL LOW (ref 3.5–5.0)
Alkaline Phosphatase: 478 U/L — ABNORMAL HIGH (ref 38–126)
Anion gap: 10 (ref 5–15)
BUN: 37 mg/dL — ABNORMAL HIGH (ref 8–23)
CO2: 22 mmol/L (ref 22–32)
Calcium: 8.1 mg/dL — ABNORMAL LOW (ref 8.9–10.3)
Chloride: 97 mmol/L — ABNORMAL LOW (ref 98–111)
Creatinine, Ser: 1.39 mg/dL — ABNORMAL HIGH (ref 0.44–1.00)
GFR, Estimated: 41 mL/min — ABNORMAL LOW (ref >60)
Glucose, Bld: 368 mg/dL — ABNORMAL HIGH (ref 70–99)
Potassium: 4.7 mmol/L (ref 3.5–5.1)
Sodium: 129 mmol/L — ABNORMAL LOW (ref 135–145)
Total Bilirubin: 10.8 mg/dL — ABNORMAL HIGH (ref 0.3–1.2)
Total Protein: 6 g/dL — ABNORMAL LOW (ref 6.5–8.1)

## 2021-04-20 LAB — BASIC METABOLIC PANEL
Anion gap: 10 (ref 5–15)
BUN: 32 mg/dL — ABNORMAL HIGH (ref 8–23)
CO2: 20 mmol/L — ABNORMAL LOW (ref 22–32)
Calcium: 7.8 mg/dL — ABNORMAL LOW (ref 8.9–10.3)
Chloride: 98 mmol/L (ref 98–111)
Creatinine, Ser: 1.26 mg/dL — ABNORMAL HIGH (ref 0.44–1.00)
GFR, Estimated: 47 mL/min — ABNORMAL LOW (ref >60)
Glucose, Bld: 336 mg/dL — ABNORMAL HIGH (ref 70–99)
Potassium: 4.2 mmol/L (ref 3.5–5.1)
Sodium: 128 mmol/L — ABNORMAL LOW (ref 135–145)

## 2021-04-20 LAB — CBC WITH DIFFERENTIAL/PLATELET
Abs Immature Granulocytes: 0.1 10*3/uL — ABNORMAL HIGH (ref 0.00–0.07)
Basophils Absolute: 0 10*3/uL (ref 0.0–0.1)
Basophils Relative: 0 %
Eosinophils Absolute: 0 10*3/uL (ref 0.0–0.5)
Eosinophils Relative: 0 %
HCT: 33.2 % — ABNORMAL LOW (ref 36.0–46.0)
Hemoglobin: 10.6 g/dL — ABNORMAL LOW (ref 12.0–15.0)
Immature Granulocytes: 1 %
Lymphocytes Relative: 10 %
Lymphs Abs: 1.2 10*3/uL (ref 0.7–4.0)
MCH: 31.5 pg (ref 26.0–34.0)
MCHC: 31.9 g/dL (ref 30.0–36.0)
MCV: 98.8 fL (ref 80.0–100.0)
Monocytes Absolute: 1.2 10*3/uL — ABNORMAL HIGH (ref 0.1–1.0)
Monocytes Relative: 11 %
Neutro Abs: 8.9 10*3/uL — ABNORMAL HIGH (ref 1.7–7.7)
Neutrophils Relative %: 78 %
Platelets: 330 10*3/uL (ref 150–400)
RBC: 3.36 MIL/uL — ABNORMAL LOW (ref 3.87–5.11)
RDW: 15.9 % — ABNORMAL HIGH (ref 11.5–15.5)
WBC: 11.5 10*3/uL — ABNORMAL HIGH (ref 4.0–10.5)
nRBC: 0 % (ref 0.0–0.2)

## 2021-04-20 LAB — PROCALCITONIN: Procalcitonin: 5.94 ng/mL

## 2021-04-20 LAB — PROTIME-INR
INR: 1.6 — ABNORMAL HIGH (ref 0.8–1.2)
Prothrombin Time: 19.3 seconds — ABNORMAL HIGH (ref 11.4–15.2)

## 2021-04-20 LAB — LACTIC ACID, PLASMA
Lactic Acid, Venous: 3.6 mmol/L (ref 0.5–1.9)
Lactic Acid, Venous: 2.6 mmol/L (ref 0.5–1.9)
Lactic Acid, Venous: 3.3 mmol/L (ref 0.5–1.9)

## 2021-04-20 LAB — APTT: aPTT: 37 seconds — ABNORMAL HIGH (ref 24–36)

## 2021-04-20 LAB — SURGICAL PATHOLOGY

## 2021-04-20 LAB — RESP PANEL BY RT-PCR (FLU A&B, COVID) ARPGX2
Influenza A by PCR: NEGATIVE
Influenza B by PCR: NEGATIVE
SARS Coronavirus 2 by RT PCR: NEGATIVE

## 2021-04-20 LAB — GLUCOSE, CAPILLARY
Glucose-Capillary: 332 mg/dL — ABNORMAL HIGH (ref 70–99)
Glucose-Capillary: 287 mg/dL — ABNORMAL HIGH (ref 70–99)

## 2021-04-20 LAB — OSMOLALITY: Osmolality: 294 mOsm/kg (ref 275–295)

## 2021-04-20 MED ORDER — VITAMIN D 25 MCG (1000 UNIT) PO TABS
1000.0000 [IU] | ORAL_TABLET | Freq: Every day | ORAL | Status: DC
  Administered 2021-04-20 – 2021-04-26 (×7): 1000 [IU] via ORAL
  Filled 2021-04-20 (×7): qty 1

## 2021-04-20 MED ORDER — LACTATED RINGERS IV SOLN: INTRAVENOUS | Status: DC

## 2021-04-20 MED ORDER — IBUPROFEN 400 MG PO TABS
200.0000 mg | ORAL_TABLET | Freq: Four times a day (QID) | ORAL | Status: DC | PRN
  Administered 2021-04-20 – 2021-04-23 (×2): 200 mg via ORAL
  Filled 2021-04-20 (×2): qty 1

## 2021-04-20 MED ORDER — SODIUM CHLORIDE 0.9 % IV BOLUS
1000.0000 mL | Freq: Once | INTRAVENOUS | Status: AC
Start: 2021-04-20 — End: 2021-04-20
  Administered 2021-04-20: 1000 mL via INTRAVENOUS

## 2021-04-20 MED ORDER — MORPHINE SULFATE (CONCENTRATE) 10 MG/0.5ML PO SOLN
10.0000 mg | Freq: Four times a day (QID) | ORAL | Status: DC | PRN
  Administered 2021-04-20 – 2021-04-26 (×10): 10 mg via ORAL
  Filled 2021-04-20 (×10): qty 0.5

## 2021-04-20 MED ORDER — SODIUM CHLORIDE 0.9 % IV SOLN
2.0000 g | Freq: Once | INTRAVENOUS | Status: AC
  Administered 2021-04-20: 2 g via INTRAVENOUS
  Filled 2021-04-20: qty 2

## 2021-04-20 MED ORDER — VANCOMYCIN HCL IN DEXTROSE 1-5 GM/200ML-% IV SOLN
1000.0000 mg | Freq: Once | INTRAVENOUS | Status: AC
  Administered 2021-04-20: 1000 mg via INTRAVENOUS
  Filled 2021-04-20: qty 200

## 2021-04-20 MED ORDER — HEPARIN SODIUM (PORCINE) 5000 UNIT/ML IJ SOLN
5000.0000 [IU] | Freq: Three times a day (TID) | INTRAMUSCULAR | Status: DC
  Administered 2021-04-20 – 2021-04-26 (×17): 5000 [IU] via SUBCUTANEOUS
  Filled 2021-04-20 (×18): qty 1

## 2021-04-20 MED ORDER — FULVESTRANT 250 MG/5ML IM SOSY
500.0000 mg | PREFILLED_SYRINGE | Freq: Once | INTRAMUSCULAR | Status: AC
  Administered 2021-04-23: 500 mg via INTRAMUSCULAR
  Filled 2021-04-20 (×3): qty 10

## 2021-04-20 MED ORDER — EXEMESTANE 25 MG PO TABS
25.0000 mg | ORAL_TABLET | Freq: Every day | ORAL | Status: AC
  Administered 2021-04-21 – 2021-04-22 (×2): 25 mg via ORAL
  Filled 2021-04-20 (×2): qty 1

## 2021-04-20 MED ORDER — OYSTER SHELL CALCIUM/D3 500-5 MG-MCG PO TABS
ORAL_TABLET | Freq: Every evening | ORAL | Status: DC
  Administered 2021-04-20 – 2021-04-25 (×6): 1 via ORAL
  Filled 2021-04-20 (×6): qty 1

## 2021-04-20 MED ORDER — INSULIN ASPART 100 UNIT/ML IJ SOLN
0.0000 [IU] | Freq: Three times a day (TID) | INTRAMUSCULAR | Status: DC
  Administered 2021-04-20: 7 [IU] via SUBCUTANEOUS
  Administered 2021-04-21: 3 [IU] via SUBCUTANEOUS
  Filled 2021-04-20 (×2): qty 1

## 2021-04-20 MED ORDER — SODIUM CHLORIDE 0.9 % IV BOLUS (SEPSIS)
1000.0000 mL | Freq: Once | INTRAVENOUS | Status: AC
  Administered 2021-04-20: 1000 mL via INTRAVENOUS

## 2021-04-20 MED ORDER — CLOPIDOGREL BISULFATE 75 MG PO TABS
75.0000 mg | ORAL_TABLET | Freq: Every day | ORAL | Status: DC
Start: 2021-04-20 — End: 2021-04-26
  Administered 2021-04-20 – 2021-04-26 (×7): 75 mg via ORAL
  Filled 2021-04-20 (×7): qty 1

## 2021-04-20 MED ORDER — ONDANSETRON HCL 4 MG/2ML IJ SOLN
4.0000 mg | Freq: Three times a day (TID) | INTRAMUSCULAR | Status: DC | PRN
  Administered 2021-04-22: 4 mg via INTRAVENOUS
  Filled 2021-04-20: qty 2

## 2021-04-20 MED ORDER — SODIUM CHLORIDE 0.9 % IV SOLN
2.0000 g | Freq: Three times a day (TID) | INTRAVENOUS | Status: DC
  Administered 2021-04-20 – 2021-04-24 (×11): 2 g via INTRAVENOUS
  Filled 2021-04-20 (×14): qty 2

## 2021-04-20 MED ORDER — METRONIDAZOLE 500 MG/100ML IV SOLN
500.0000 mg | Freq: Once | INTRAVENOUS | Status: AC
  Administered 2021-04-20: 500 mg via INTRAVENOUS
  Filled 2021-04-20: qty 100

## 2021-04-20 MED ORDER — PNEUMOCOCCAL VAC POLYVALENT 25 MCG/0.5ML IJ INJ: 0.5000 mL | INJECTION | INTRAMUSCULAR | Status: DC | PRN

## 2021-04-20 MED ORDER — ADULT MULTIVITAMIN W/MINERALS CH
1.0000 | ORAL_TABLET | Freq: Every day | ORAL | Status: DC
  Administered 2021-04-20 – 2021-04-26 (×7): 1 via ORAL
  Filled 2021-04-20 (×7): qty 1

## 2021-04-20 MED ORDER — SODIUM CHLORIDE 0.9 % IV SOLN: INTRAVENOUS | Status: DC

## 2021-04-20 MED ORDER — VANCOMYCIN HCL 750 MG/150ML IV SOLN
750.0000 mg | Freq: Once | INTRAVENOUS | Status: AC
  Administered 2021-04-20: 750 mg via INTRAVENOUS
  Filled 2021-04-20 (×2): qty 150

## 2021-04-20 MED ORDER — METRONIDAZOLE 500 MG/100ML IV SOLN
500.0000 mg | Freq: Two times a day (BID) | INTRAVENOUS | Status: DC
  Administered 2021-04-20 – 2021-04-22 (×4): 500 mg via INTRAVENOUS
  Filled 2021-04-20 (×5): qty 100

## 2021-04-20 MED ORDER — INSULIN ASPART 100 UNIT/ML IJ SOLN
0.0000 [IU] | Freq: Every day | INTRAMUSCULAR | Status: DC
  Administered 2021-04-20: 3 [IU] via SUBCUTANEOUS
  Filled 2021-04-20: qty 1

## 2021-04-20 MED ORDER — PANTOPRAZOLE SODIUM 20 MG PO TBEC
20.0000 mg | DELAYED_RELEASE_TABLET | Freq: Every day | ORAL | Status: DC
  Administered 2021-04-20 – 2021-04-26 (×7): 20 mg via ORAL
  Filled 2021-04-20 (×7): qty 1

## 2021-04-20 MED ORDER — GADOBUTROL 1 MMOL/ML IV SOLN
7.5000 mL | Freq: Once | INTRAVENOUS | Status: AC | PRN
  Administered 2021-04-20: 7.5 mL via INTRAVENOUS

## 2021-04-20 NOTE — Consult Note (Signed)
CODE SEPSIS - PHARMACY COMMUNICATION  **Broad Spectrum Antibiotics should be administered within 1 hour of Sepsis diagnosis**  Time Code Sepsis Called/Page Received: 1010  Antibiotics Ordered: Vancomycin, Azactam  Time of 1st antibiotic administration: 1115  Additional action taken by pharmacy: none  If necessary, Name of Provider/Nurse Contacted: n/a    [name] ,PharmD Clinical Pharmacist  04/20/2021  11:44 AM

## 2021-04-20 NOTE — ED Notes (Signed)
Lab contacted about labs not being ran on blood tubes sent down at 8:30am. New orders placed for lab to run

## 2021-04-20 NOTE — Progress Notes (Signed)
Patient wanted to try and urinated on her own for urine sample. Attempted to collect urine specimen x 2 both samples contaminated due to feces.  Advised night shift RN to collect via in and out cath per order . Patient is in agreement plan of care.

## 2021-04-20 NOTE — Progress Notes (Signed)
Elink following for sepsis protocol. 

## 2021-04-20 NOTE — Progress Notes (Signed)
Inpatient Diabetes Program Recommendations  AACE/ADA: New Consensus Statement on Inpatient Glycemic Control  Target Ranges:  Prepandial:   less than 140 mg/dL      Peak postprandial:   less than 180 mg/dL (1-2 hours)      Critically ill patients:  140 - 180 mg/dL    Latest Reference Range & Units 04/20/21 11:06  Glucose 70 - 99 mg/dL 368 (H)    Latest Reference Range & Units 04/01/21 04:40  Hemoglobin A1C 4.8 - 5.6 % 7.0 (H)   Review of Glycemic Control  Diabetes history: DM2 Outpatient Diabetes medications: Amaryl 4 mg BID, Metformin 500 mg BID; per home med list not taking Actos 30 mg daily, Janumet 50-500 mg BID, and Jardiance 25 mg daily (hold per Dr. Tasia Catchings 04/16/21) Current orders for Inpatient glycemic control: None; in ED  Inpatient Diabetes Program Recommendations:    Insulin: Please consider ordering CBGs Q4H and Novolog 0-9 units Q4H.  NOTE: In ED with fever, weakness, tachycardia;  to be admitted with sepsis. Patient has hx of breast cancer with liver metz. Sees Dr. Gabriel Carina (Endocrinologist); last seen 01/08/21 and per note on 01/08/21 patient reported cost of Jardiance and Janumet were expensive (>$300 for 90 day supply) and had reported she was not taking Actos. Patient declined to use insulin which was more affordable with insurance. Was told to resume Actos and take other oral DM meds as prescribed. Patient seen Dr. Tasia Catchings with Oncology on 04/16/21 and was told to stop Actos, Janumet, and Jardiance and continue Metformin 500 mg BID. Initial glucose today 368 mg/dl at 11:06 am but not acidotic. Went by to see patient in ED but she is not currently in room. Will follow along while inpatient.  Thanks, Barnie Alderman, RN, MSN, CDE Diabetes Coordinator Inpatient Diabetes Program [phone] (Team Pager from 8am to 5pm)

## 2021-04-20 NOTE — ED Notes (Signed)
Lab asked to send phlebotomist. Lab reported they could not run specimen sent earlier

## 2021-04-20 NOTE — ED Triage Notes (Signed)
Patient arrived from home by EMS from home for fever and weakness. Left sided breast cancer that has metastasized to liver. Patients face appears pale/yellowish. Patient reports she started feeling very weak around two weeks ago. Today patient could not ambulate without two assist

## 2021-04-20 NOTE — ED Notes (Signed)
X-ray at bedside

## 2021-04-20 NOTE — Consult Note (Signed)
Pharmacy Antibiotic Note  Tammy Elliott is a 71 y.o. female admitted on 04/20/2021 with sepsis.  Pharmacy has been consulted for Vancomycin and Azactam(PCN allergy) dosing. MD currently deferring on challenging allergy with cephalosporin as no documented use was found prior.  Plan: Patient received Vancomycin 1g IV in the ED. Will order additional 750mg  IV x 1 to complete loading dose of 1750mg  total. --Patient in AKI. Will hold off on scheduled dosing currently and check Random Vancomyin level~24 hours after additional 750mg  dose is given  Aztreonam 2g IV Q8 hours  Height: 5\' 8"  (172.7 cm) Weight: 77.1 kg (170 lb) IBW/kg (Calculated) : 63.9  Temp (24hrs), Avg:99.8 F (37.7 C), Min:98.8 F (37.1 C), Max:100.7 F (38.2 C)  Recent Labs  Lab 04/16/21 0941 04/20/21 0829 04/20/21 1106  WBC 7.9 11.5*  --   CREATININE 0.67  --  1.39*  LATICACIDVEN  --  3.6* 2.6*    Estimated Creatinine Clearance: 42.3 mL/min (A) (by C-G formula based on SCr of 1.39 mg/dL (H)).    Allergies  Allergen Reactions   Penicillins Hives and Swelling   Exenatide Nausea Only   Orlistat Diarrhea   Thiazide-Type Diuretics     Unknown reaction    Antimicrobials this admission: Vancomycin 2/10 >>  Azactam 2/10 >>   Microbiology results: 2/10 BCx: pending 2/10 UCx: pending   Thank you for allowing pharmacy to be a part of this patients care.  Tammy Elliott 04/20/2021 5:53 PM

## 2021-04-20 NOTE — Consult Note (Signed)
PHARMACY -  BRIEF ANTIBIOTIC NOTE   Pharmacy has received consult(s) for Vancomycin and Azactam from an ED provider.  The patient's profile has been reviewed for ht/wt/allergies/indication/available labs.    One time order(s) placed for Vancomycin 1g IV and Azactam 2g IV x 1 dose each.  Further antibiotics/pharmacy consults should be ordered by admitting physician if indicated.                       Thank you, [name] 04/20/2021  10:22 AM

## 2021-04-20 NOTE — ED Provider Notes (Addendum)
[facility] Provider Note    Event Date/Time   First MD Initiated Contact with Patient 04/20/21 [phone]     (approximate)   History   Fever   HPI  Tammy Elliott is a 71 y.o. female with a recent diagnosis return of breast cancer with metastases to the liver presents with complaints of weakness and fatigue.  Found to be febrile in triage.  Denies dysuria or cough or shortness of breath.  No new abdominal pain.  No rash     Physical Exam   Triage Vital Signs: ED Triage Vitals  Enc Vitals Group     BP 04/20/21 0825 (!) 153/70     Pulse Rate 04/20/21 0825 (!) 101     Resp 04/20/21 0825 (!) 22     Temp 04/20/21 0825 (!) 100.7 F (38.2 C)     Temp Source 04/20/21 0825 Oral     SpO2 04/20/21 0825 97 %     Weight 04/20/21 0829 77.1 kg (170 lb)     Height 04/20/21 0829 1.727 m (5\' 8" )     Head Circumference --      Peak Flow --      Pain Score 04/20/21 0829 0     Pain Loc --      Pain Edu? --      Excl. in [place]? --     Most recent vital signs: Vitals:   04/20/21 0825 04/20/21 1048  BP: (!) 153/70 135/63  Pulse: (!) 101 (!) 103  Resp: (!) 22 20  Temp: (!) 100.7 F (38.2 C)   SpO2: 97% 99%     General: Awake, no distress.  CV:  Good peripheral perfusion.  Regular rate and rhythm Resp:  Normal effort.  Clear to auscultation bilaterally Abd:  No distention.  No significant tenderness to palpation Other:  Patient is jaundiced   ED Results / Procedures / Treatments   Labs (all labs ordered are listed, but only abnormal results are displayed) Labs Reviewed  LACTIC ACID, PLASMA - Abnormal; Notable for the following components:      Result Value   Lactic Acid, Venous 3.6 (*)    All other components within normal limits  LACTIC ACID, PLASMA - Abnormal; Notable for the following components:   Lactic Acid, Venous 2.6 (*)    All other components within normal limits  APTT - Abnormal; Notable for the following components:   aPTT 37 (*)    All  other components within normal limits  CBC WITH DIFFERENTIAL/PLATELET - Abnormal; Notable for the following components:   WBC 11.5 (*)    RBC 3.36 (*)    Hemoglobin 10.6 (*)    HCT 33.2 (*)    RDW 15.9 (*)    Neutro Abs 8.9 (*)    Monocytes Absolute 1.2 (*)    Abs Immature Granulocytes 0.10 (*)    All other components within normal limits  COMPREHENSIVE METABOLIC PANEL - Abnormal; Notable for the following components:   Sodium 129 (*)    Chloride 97 (*)    Glucose, Bld 368 (*)    BUN 37 (*)    Creatinine, Ser 1.39 (*)    Calcium 8.1 (*)    Total Protein 6.0 (*)    Albumin 2.0 (*)    AST 427 (*)    ALT 132 (*)    Alkaline Phosphatase 478 (*)    Total Bilirubin 10.8 (*)    GFR, Estimated 41 (*)    All  other components within normal limits  RESP PANEL BY RT-PCR (FLU A&B, COVID) [id number]  CULTURE, BLOOD (ROUTINE X 2)  CULTURE, BLOOD (ROUTINE X 2)  URINE CULTURE  CBC WITH DIFFERENTIAL/PLATELET  URINALYSIS, COMPLETE (UACMP) WITH MICROSCOPIC     EKG   ED ECG REPORT I, Lavonia Drafts, the attending physician, personally viewed and interpreted this ECG.  Date: 04/20/2021  Rhythm: normal sinus rhythm QRS Axis: normal Intervals: normal ST/T Wave abnormalities: normal Narrative Interpretation: no evidence of acute ischemia   RADIOLOGY Chest x-ray reviewed by me, no acute abnormality    PROCEDURES:  Critical Care performed: yes  CRITICAL CARE Performed by: Lavonia Drafts   Total critical care time: 30 minutes  Critical care time was exclusive of separately billable procedures and treating other patients.  Critical care was necessary to treat or prevent imminent or life-threatening deterioration.  Critical care was time spent personally by me on the following activities: development of treatment plan with patient and/or surrogate as well as nursing, discussions with consultants, evaluation of patient's response to treatment, examination of patient, obtaining history  from patient or surrogate, ordering and performing treatments and interventions, ordering and review of laboratory studies, ordering and review of radiographic studies, pulse oximetry and re-evaluation of patient's condition.   Procedures   MEDICATIONS ORDERED IN ED: Medications  lactated ringers infusion (has no administration in time range)  metroNIDAZOLE (FLAGYL) IVPB 500 mg (has no administration in time range)  vancomycin (VANCOCIN) IVPB 1000 mg/200 mL premix (has no administration in time range)  sodium chloride 0.9 % bolus 1,000 mL (1,000 mLs Intravenous New Bag/Given 04/20/21 1047)  aztreonam (AZACTAM) 2 g in sodium chloride 0.9 % 100 mL IVPB (2 g Intravenous New Bag/Given 04/20/21 1115)     IMPRESSION / MDM / ASSESSMENT AND PLAN / ED COURSE  I reviewed the triage vital signs and the nursing notes.  Patient presents with fever, weakness, tachycardia in the setting of metastatic breast cancer, concerning for sepsis, possibility of viral illness as well including COVID or influenza  White blood cell count is elevated, lactic acid is elevated at 3.6.  Blood pressure is normal.  Code sepsis activated  Unclear source of sepsis, will cover broadly, she does have a penicillin allergy.  IV fluids infusing.  Patient's total bilirubin has increased to 10.8 from 7.3, she did recently have a ultrasound which did not show any clear obstruction  I have consulted oncology, they will see the patient in the hospital  We will consult the hospitalist for admission  Hospitalist has requested MRCP, consulted with Dr. Allen Norris of GI who agrees with mrcp  MRCP without evidence of choledocholithiasis  We will admit to the hospitalist service            FINAL CLINICAL IMPRESSION(S) / ED DIAGNOSES   Final diagnoses:  Sepsis, due to unspecified organism, unspecified whether acute organ dysfunction present [facility])     Rx / DC Orders   ED Discharge Orders     None        Note:   This document was prepared using Dragon voice recognition software and may include unintentional dictation errors.   Lavonia Drafts, MD 04/20/21 1213    Lavonia Drafts, MD 04/20/21 1446

## 2021-04-20 NOTE — Telephone Encounter (Signed)
Late entry from 04/19/21:   Received message from scheduling stating that husband called and had questions regarding tx on 2/10 and he did not think he had the full understanding of pt's tx plan  Dr. Tasia Catchings called and spoke to Mr. Bischoff and discussed tx plan. All questions were answered.

## 2021-04-20 NOTE — Consult Note (Signed)
Hematology/Oncology Consult note [phone] [phone]      Patient Care Team: Baxter Hire, MD as PCP - General (Internal Medicine) Leonie Man, MD as PCP - Cardiology (Cardiology) Earlie Server, MD as Consulting Physician (Oncology) Herbert Pun, MD as Consulting Physician (General Surgery) Dillingham, Loel Lofty, DO as Attending Physician (Plastic Surgery) Rico Junker, RN as Registered Nurse Theodore Demark, RN as Registered Nurse   Name of the patient: Tammy Elliott  [id number]  1951-01-07   Date of visit: 04/20/21 REASON FOR COSULTATION:  Metastatic breast cancer  History of presenting illness-  71 y.o. female with PMH listed at below who presents to ER for evaluation weakness and fatigue.  Found to be febrile in triage. Husband is at the bedside. Patient is known to oncology service with history of early-stage breast cancer, recently diagnosed with metastatic breast cancer with liver metastasis.  There is plan for patient to start first dose of chemotherapy.  Per husband, patient was feeling well 2 days ago and yesterday after chemotherapy education class, she feels more tired and fatigued.  This morning is not able to get up.  Patient has neoplasm related pain and takes morphine liquid.  She denies any new abdominal discomfort.  Denies any dysuria cough, shortness of breath.    Review of Systems  Constitutional:  Positive for appetite change and fatigue. Negative for chills and fever.  HENT:   Negative for hearing loss and voice change.   Eyes:  Negative for eye problems.  Respiratory:  Negative for chest tightness and cough.   Cardiovascular:  Negative for chest pain.  Gastrointestinal:  Positive for abdominal pain. Negative for abdominal distention and blood in stool.  Endocrine: Negative for hot flashes.  Genitourinary:  Negative for difficulty urinating and frequency.   Musculoskeletal:  Negative for arthralgias.  Skin:   Negative for itching and rash.  Neurological:  Negative for extremity weakness.  Hematological:  Negative for adenopathy.  Psychiatric/Behavioral:  Negative for confusion.    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   Basal cell carcinoma 03/28/2020   Diabetes mellitus type 2 with complications ([place]) 97/94/8016   Pelvic pain in female 03/28/2020   Abdominal pain 03/28/2020   S/P mastectomy, left 05/11/2019   Malignant neoplasm of upper-outer quadrant of left breast in female, estrogen receptor positive ([place]) 03/23/2019   CAD S/P percutaneous coronary angioplasty 05/04/2018   GERD (gastroesophageal reflux disease) 08/22/2014   CAD (coronary artery disease) 10/20/2013   Hyperlipemia 10/20/2013     Past Medical History:  Diagnosis Date   Aortic atherosclerosis ([place])    CAD (coronary artery disease) 04/12/2010   a.) PCI --> 85% pLAD --> 2.5 x 15 mm Xience V DES x 1   Chronic anticoagulation    Rivaroxaban   Chronic ischemic heart disease    DOE (dyspnea on exertion)    Family history of uterine cancer    GERD (gastroesophageal reflux disease)    Hypercholesterolemia    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   a.) Stage IA (cT1b, cN0, cM0, G2, ER+, PR+, HER2-); b.) adjuvant endocrine therapy started 06/2019; c.) s/p total mastectomy, SNLB, and breast reconstruction in 04/2019   Liver cancer ([place])    Skin cancer, basal cell    s/p excision   Superior mesenteric artery stenosis ([facility])    T2DM (type 2  diabetes mellitus) ([place])      Past Surgical History:  Procedure Laterality Date   BREAST BIOPSY Left 03/11/2019   Stereotactic CNB; pathology --> ER/PR (-), HER2/neu (-) IMC iwth lobular features   BREAST RECONSTRUCTION WITH PLACEMENT OF TISSUE EXPANDER AND FLEX HD (ACELLULAR HYDRATED DERMIS) Left 05/03/2019   Procedure: LEFT BREAST RECONSTRUCTION WITH PLACEMENT OF TISSUE EXPANDER AND FLEX HD (ACELLULAR HYDRATED DERMIS);  Surgeon: Wallace Going, DO;  Location: [facility];  Service: Plastics;  Laterality: Left;   CHOLECYSTECTOMY     COLONOSCOPY     CORONARY ANGIOPLASTY WITH STENT PLACEMENT Left 04/12/2010   Procedure: LHC with placement of 2.5 x 15 mm Xience V DES x 1 to pLAD; Location: [facility]; Surgeon: Isaias Cowman, MD   EXCISION OF BREAST BIOPSY Left 11/17/2020   Procedure: EXCISION OF BREAST BIOPSY;  Surgeon: Herbert Pun, MD;  Location: [facility];  Service: General;  Laterality: Left;   LIPOSUCTION WITH LIPOFILLING Bilateral 01/27/2020   Procedure: LIPOSUCTION WITH LIPOFILLING;  Surgeon: Wallace Going, DO;  Location: [facility];  Service: Plastics;  Laterality: Bilateral;  90 min   MASTOPEXY Right 07/01/2019   Procedure: MASTOPEXY;  Surgeon: Wallace Going, DO;  Location: [facility];  Service: Plastics;  Laterality: Right;   REMOVAL OF TISSUE EXPANDER AND PLACEMENT OF IMPLANT Left 07/01/2019   Procedure: REMOVAL OF TISSUE EXPANDER AND PLACEMENT OF IMPLANT;  Surgeon: Wallace Going, DO;  Location: [facility];  Service: Plastics;  Laterality: Left;  total case 2.5 hours   TOTAL MASTECTOMY Left 05/03/2019   Procedure: TOTAL MASTECTOMY W/ Sentinel Node;  Surgeon: Herbert Pun, MD;  Location: [facility];  Service: General;  Laterality: Left;   VISCERAL ANGIOGRAPHY N/A 05/01/2020   Procedure: VISCERAL ANGIOGRAPHY;  Surgeon: Algernon Huxley, MD;  Location: [facility];  Service: Cardiovascular;   Laterality: N/A;    Social History   Socioeconomic History   Marital status: Married    Spouse name: Not on file   Number of children: Not on file   Years of education: Not on file   Highest education level: Not on file  Occupational History   Occupation: Analyst    Employer: [employer]  Tobacco Use   Smoking status: Never   Smokeless tobacco: Never  Vaping Use   Vaping Use: Never used  Substance and Sexual Activity   Alcohol use: Not Currently    Comment: rarely   Drug use: Never   Sexual activity: Not on file  Other Topics Concern   Not on file  Social History Narrative   reports that she has never smoked. She has never used smokeless tobacco. She reports that she rarely drinks alcohol. She reports that she does not use drugs.      Married to [name] ([name]) [name]    Social Determinants of Health   Financial Resource Strain: Not on file  Food Insecurity: Not on file  Transportation Needs: Not on file  Physical Activity: Not on file  Stress: Not on file  Social Connections: Not on file  Intimate Partner Violence: Not on file  Family History  Problem Relation Age of Onset   CAD Mother    Diabetes Mellitus II Mother    Hypertension Mother    Uterine cancer Mother        dx late 61s   CAD Father    Heart attack Father    Hypertension Father    Diabetes Mellitus II Father        Took insulin   Alcohol abuse Father    Diabetes Mellitus II Sister    Cancer Sister        possible cervical or uterine   Lung cancer Paternal Aunt    Cancer Paternal Uncle        unk type   Diabetes Other        Almost all sisters are diabetic or pre-diabetic    Cancer Cousin        possible colon/stomach/something in abdomen   Breast cancer Neg Hx      Current Facility-Administered Medications:    0.9 %  sodium chloride infusion, , Intravenous, Continuous, Ivor Costa, MD   ibuprofen (ADVIL) tablet 200 mg, 200 mg, Oral, Q6H PRN, Ivor Costa, MD   insulin aspart (novoLOG)  injection 0-5 Units, 0-5 Units, Subcutaneous, QHS, Ivor Costa, MD   insulin aspart (novoLOG) injection 0-9 Units, 0-9 Units, Subcutaneous, TID WC, Ivor Costa, MD   lactated ringers infusion, , Intravenous, Continuous, Ivor Costa, MD   ondansetron [facility]) injection 4 mg, 4 mg, Intravenous, Q8H PRN, Ivor Costa, MD   sodium chloride 0.9 % bolus 1,000 mL, 1,000 mL, Intravenous, Once, Ivor Costa, MD   vancomycin (VANCOCIN) IVPB 1000 mg/200 mL premix, 1,000 mg, Intravenous, Once, Lavonia Drafts, MD  Current Outpatient Medications:    CALCIUM-MAGNESIUM-ZINC PO, Take 1 tablet by mouth every evening. , Disp: , Rfl:    clopidogrel (PLAVIX) 75 MG tablet, Take 75 mg by mouth daily., Disp: , Rfl:    dexamethasone (DECADRON) 4 MG tablet, Take 1 tablet (4 mg total) by mouth daily., Disp: 10 tablet, Rfl: 0   glimepiride (AMARYL) 4 MG tablet, Take 4 mg by mouth 2 (two) times daily., Disp: , Rfl:    lovastatin (MEVACOR) 40 MG tablet, Take 40 mg by mouth every evening., Disp: , Rfl:    metFORMIN (GLUCOPHAGE) 500 MG tablet, Take 500 mg by mouth 2 (two) times daily. , Disp: , Rfl:    Multiple Vitamin (MULTIVITAMIN) tablet, Take 1 tablet by mouth daily., Disp: , Rfl:    Vitamin D, Cholecalciferol, 25 MCG (1000 UT) CAPS, Take 1 daily, Disp: 60 capsule, Rfl: 0   acetaminophen (TYLENOL) 500 MG tablet, Take 1,000 mg by mouth every 6 (six) hours as needed for moderate pain., Disp: , Rfl:    anastrozole (ARIMIDEX) 1 MG tablet, Take 1 tablet (1 mg total) by mouth daily. (Patient not taking: Reported on 04/20/2021), Disp: 90 tablet, Rfl: 1   glucose blood (CONTOUR NEXT TEST) test strip, Use once daily e11.65, Disp: , Rfl:    JANUMET 50-500 MG tablet, Take 1 tablet by mouth 2 (two) times daily. (Patient not taking: Reported on 04/20/2021), Disp: , Rfl:    JARDIANCE 25 MG TABS tablet, Take 25 mg by mouth daily.  (Patient not taking: Reported on 04/20/2021), Disp: , Rfl:    lansoprazole (PREVACID) 15 MG capsule, Take 15 mg by  mouth daily as needed (acid reflux)., Disp: , Rfl:    Morphine Sulfate (MORPHINE CONCENTRATE) 10 mg / 0.5 ml concentrated solution, Take 0.5 mLs (10 mg total) by  mouth every 6 (six) hours as needed for severe pain., Disp: 118 mL, Rfl: 0   ondansetron (ZOFRAN) 8 MG tablet, Take 1 tablet (8 mg total) by mouth 2 (two) times daily as needed (Nausea or vomiting)., Disp: 30 tablet, Rfl: 1   pioglitazone (ACTOS) 30 MG tablet, Take 30 mg by mouth daily. (Patient not taking: Reported on 04/20/2021), Disp: , Rfl:    prochlorperazine (COMPAZINE) 10 MG tablet, Take 1 tablet (10 mg total) by mouth every 6 (six) hours as needed (Nausea or vomiting)., Disp: 30 tablet, Rfl: 1   Physical exam:  Vitals:   04/20/21 0825 04/20/21 0829 04/20/21 1048 04/20/21 1243  BP: (!) 153/70  135/63 132/62  Pulse: (!) 101  (!) 103 100  Resp: (!) '22  20 20  ' Temp: (!) 100.7 F (38.2 C)     TempSrc: Oral     SpO2: 97%  99% 100%  Weight:  170 lb (77.1 kg)    Height:  '5\' 8"'  (1.727 m)     Physical Exam Constitutional:      General: She is not in acute distress.    Appearance: She is not diaphoretic.  HENT:     Head: Normocephalic and atraumatic.     Nose: Nose normal.     Mouth/Throat:     Pharynx: No oropharyngeal exudate.  Eyes:     General: Scleral icterus present.     Pupils: Pupils are equal, round, and reactive to light.  Cardiovascular:     Rate and Rhythm: Normal rate and regular rhythm.     Heart sounds: No murmur heard. Pulmonary:     Effort: Pulmonary effort is normal. No respiratory distress.     Breath sounds: No rales.  Chest:     Chest wall: No tenderness.  Abdominal:     Tenderness: There is no abdominal tenderness.     Comments: Slightly distended  Musculoskeletal:        General: Normal range of motion.     Cervical back: Normal range of motion and neck supple.  Skin:    General: Skin is warm and dry.     Coloration: Skin is jaundiced.     Findings: No erythema.  Neurological:     Mental  Status: She is alert and oriented to person, place, and time.     Cranial Nerves: No cranial nerve deficit.     Motor: No abnormal muscle tone.     Coordination: Coordination normal.  Psychiatric:        Mood and Affect: Affect normal.        CMP Latest Ref Rng & Units 04/20/2021  Glucose 70 - 99 mg/dL 368(H)  BUN 8 - 23 mg/dL 37(H)  Creatinine 0.44 - 1.00 mg/dL 1.39(H)  Sodium 135 - 145 mmol/L 129(L)  Potassium 3.5 - 5.1 mmol/L 4.7  Chloride 98 - 111 mmol/L 97(L)  CO2 22 - 32 mmol/L 22  Calcium 8.9 - 10.3 mg/dL 8.1(L)  Total Protein 6.5 - 8.1 g/dL 6.0(L)  Total Bilirubin 0.3 - 1.2 mg/dL 10.8(H)  Alkaline Phos 38 - 126 U/L 478(H)  AST 15 - 41 U/L 427(H)  ALT 0 - 44 U/L 132(H)   CBC Latest Ref Rng & Units 04/20/2021  WBC 4.0 - 10.5 K/uL 11.5(H)  Hemoglobin 12.0 - 15.0 g/dL 10.6(L)  Hematocrit 36.0 - 46.0 % 33.2(L)  Platelets 150 - 400 K/uL 330    RADIOGRAPHIC STUDIES: I have personally reviewed the radiological images as listed and agreed with the  findings in the report. DG Chest 2 View  Result Date: 03/31/2021 CLINICAL DATA:  Shortness of breath. EXAM: CHEST - 2 VIEW COMPARISON:  None. FINDINGS: Heart size and mediastinal contours are normal. No pleural effusion or edema identified. No airspace opacities. Visualized osseous structures are unremarkable. IMPRESSION: No active cardiopulmonary abnormalities. Electronically Signed   By: Kerby Moors M.D.   On: 03/31/2021 14:04   CT CHEST W CONTRAST  Result Date: 04/01/2021 CLINICAL DATA:  Evaluate for malignancy. Newly discovered liver metastases. EXAM: CT CHEST WITH CONTRAST TECHNIQUE: Multidetector CT imaging of the chest was performed during intravenous contrast administration. RADIATION DOSE REDUCTION: This exam was performed according to the departmental dose-optimization program which includes automated exposure control, adjustment of the mA and/or kV according to patient size and/or use of iterative reconstruction  technique. CONTRAST:  132m OMNIPAQUE IOHEXOL 350 MG/ML SOLN COMPARISON:  CT AP 03/31/2021. FINDINGS: Cardiovascular: No significant vascular findings. Normal heart size. No pericardial effusion. Aortic atherosclerosis and coronary artery calcifications. Mediastinum/Nodes: No enlarged mediastinal, hilar, or axillary lymph nodes. Thyroid gland, trachea, and esophagus demonstrate no significant findings. Lungs/Pleura: Scar like density identified within the superior segment of right lower lobe. No pleural effusion, airspace consolidation, or pneumothorax. No suspicious pulmonary nodule or mass identified. Dependent changes are noted within the posterior lung bases. Upper Abdomen: Multifocal liver metastases are again identified involving both lobes of liver. No acute abnormality noted within the imaged portions of the upper abdomen. Musculoskeletal: Status post left mastectomy with implant reconstruction. There is mild skin thickening with subcutaneous soft tissue stranding within the periphery of the reconstructed left breast, image 90/3. IMPRESSION: 1. No evidence for primary lung neoplasm or metastatic disease to the chest. 2. Status post left mastectomy with implant reconstruction. There is mild skin thickening with subcutaneous soft tissue stranding within the periphery of the reconstructed left breast. This is nonspecific and may be related to postsurgical change. 3. Multifocal liver metastases. 4. Coronary artery calcifications. 5. Aortic Atherosclerosis (ICD10-I70.0). Electronically Signed   By: TKerby MoorsM.D.   On: 04/01/2021 11:42   CT ABDOMEN PELVIS W CONTRAST  Result Date: 03/31/2021 CLINICAL DATA:  Abdominal pain. EXAM: CT ABDOMEN AND PELVIS WITH CONTRAST TECHNIQUE: Multidetector CT imaging of the abdomen and pelvis was performed using the standard protocol following bolus administration of intravenous contrast. RADIATION DOSE REDUCTION: This exam was performed according to the departmental  dose-optimization program which includes automated exposure control, adjustment of the mA and/or kV according to patient size and/or use of iterative reconstruction technique. CONTRAST:  763mOMNIPAQUE IOHEXOL 300 MG/ML  SOLN COMPARISON:  03/02/2020 FINDINGS: Lower chest: Visualized lung bases are clear. Hepatobiliary: Extensive ill-defined low-attenuation lesions are identified throughout both lobes of compatible with metastatic disease. This is a new finding when compared with 03/02/2020. Index lesion within right lobe of liver measures 2.8 by 2.1 cm, image 16/3. Index lesion within segment 4 measures 2.4 x 2.0 cm, image 27/3. Index lesion within segment 2/3 measures 2.4 x 2.3 cm, image 25/3. Status post cholecystectomy. No bile duct dilatation. Pancreas: Unremarkable. No pancreatic ductal dilatation or surrounding inflammatory changes. Spleen: Normal in size without focal abnormality. Adrenals/Urinary Tract: Normal adrenal glands. No kidney mass or hydronephrosis identified. No nephrolithiasis. Urinary bladder is unremarkable. Stomach/Bowel: Stomach appears within normal limits. No small bowel wall thickening, inflammation, or distension. No pathologic dilatation of the colon. No obstructing colonic mass identified. Vascular/Lymphatic: Aortic atherosclerosis. No aneurysm. No abdominopelvic adenopathy. Reproductive: Small exophytic calcified fibroid arises off the anterior uterus. Uterus  is otherwise unremarkable. No adnexal mass. Other: No free fluid or fluid collections. No aggressive lytic or sclerotic bone lesions. Musculoskeletal: No acute or significant osseous findings. IMPRESSION: 1. No acute findings identified within the abdomen or pelvis. 2. Extensive ill-defined low-attenuation lesions throughout both lobes of liver compatible with metastatic disease. This is a new finding when compared with previous CT of the abdomen pelvis from 03/02/2020. No primary neoplasm identified within the abdomen or pelvis.  3. Aortic Atherosclerosis (ICD10-I70.0). Electronically Signed   By: Kerby Moors M.D.   On: 03/31/2021 14:39   MR 3D Recon At Scanner  Result Date: 04/20/2021 CLINICAL DATA:  Jaundice.  Fever.  Metastatic breast carcinoma. EXAM: MRI ABDOMEN WITHOUT AND WITH CONTRAST (INCLUDING MRCP) TECHNIQUE: Multiplanar multisequence MR imaging of the abdomen was performed both before and after the administration of intravenous contrast. Heavily T2-weighted images of the biliary and pancreatic ducts were obtained, and three-dimensional MRCP images were rendered by post processing. CONTRAST:  7.38m GADAVIST GADOBUTROL 1 MMOL/ML IV SOLN COMPARISON:  CT on 03/31/2021 FINDINGS: Lower chest: Tiny bilateral pleural effusions. Infiltrate or atelectasis in the posterior lung bases, right side greater than left. Hepatobiliary: Image degradation by motion artifact noted. Innumerable hypovascular masses are seen involving the liver diffusely, consistent with diffuse liver metastases. Largest index lesion located in segment 8 measures 5.1 x 4.2 cm on image 31/23. Comparison with prior CT is limited by differences in modality, without definite change when allowing for technical differences. Prior cholecystectomy. No evidence of biliary obstruction. Pancreas:  No mass or inflammatory changes. Spleen:  Within normal limits in size and appearance. Adrenals/Urinary Tract: No masses identified. No evidence of hydronephrosis. Stomach/Bowel: Unremarkable. Vascular/Lymphatic: No pathologically enlarged lymph nodes identified. No acute vascular findings. Other: Mild ascites and diffuse mesenteric and body wall edema noted. Musculoskeletal:  No suspicious bone lesions identified. IMPRESSION: Diffuse liver metastases, similar to recent CT. New mild ascites and anasarca. Tiny bilateral pleural effusions, with atelectasis or infiltrate in both lung bases. Prior cholecystectomy.  No evidence of biliary ductal dilatation. Electronically Signed   By:  JMarlaine HindM.D.   On: 04/20/2021 14:42   [place]BIOPSY (LIVER)  Result Date: 04/09/2021 INDICATION: Liver lesions, concern for metastatic breast cancer EXAM: Ultrasound-guided core needle biopsy of liver lesion MEDICATIONS: None. ANESTHESIA/SEDATION: Moderate (conscious) sedation was employed during this procedure. A total of Versed 2 mg and Fentanyl 100 mcg was administered intravenously. Moderate Sedation Time: 12 minutes. The patient's level of consciousness and vital signs were monitored continuously by radiology nursing throughout the procedure under my direct supervision. FLUOROSCOPY TIME:  N/a COMPLICATIONS: None immediate. PROCEDURE: Informed written consent was obtained from the patient after a thorough discussion of the procedural risks, benefits and alternatives. All questions were addressed. Maximal Sterile Barrier Technique was utilized including caps, mask, sterile gowns, sterile gloves, sterile drape, hand hygiene and skin antiseptic. A timeout was performed prior to the initiation of the procedure. The patient was placed supine on the exam table. Limited ultrasound of the liver was performed, demonstrating numerous lesions within the right and left hepatic lobes. It appropriate lesion was identified in the left hepatic lobe amenable for percutaneous biopsy. Skin entry site was marked, the overlying skin was prepped and draped in the standard sterile fashion. Local analgesia was obtained with 1% lidocaine. Using ultrasound guidance, a 17 gauge introducer needle was advanced towards the target lesion in the left hepatic lobe. Subsequently, core needle biopsy was performed using a 18 gauge core biopsy device x3 passes.  Adequate tissue samples were obtained, and submitted in formalin to pathology for further handling. Limited postprocedure imaging demonstrated no hematoma or complicating feature. A clean dressing was placed after manual hemostasis. The patient tolerated the procedure well without  immediate complication. IMPRESSION: Successful ultrasound-guided core needle biopsy of focal lesion in the left hepatic lobe. Electronically Signed   By: Albin Felling M.D.   On: 04/09/2021 11:07   DG Chest Port 1 View  Result Date: 04/20/2021 CLINICAL DATA:  Fever. EXAM: PORTABLE CHEST 1 VIEW COMPARISON:  March 31, 2021. FINDINGS: The heart size and mediastinal contours are within normal limits. Hypoinflation of the lungs is noted with minimal bibasilar subsegmental atelectasis. The visualized skeletal structures are unremarkable. IMPRESSION: Hypoinflation of the lungs with minimal bibasilar subsegmental atelectasis. Electronically Signed   By: Marijo Conception M.D.   On: 04/20/2021 08:49   MR ABDOMEN MRCP W WO CONTAST  Result Date: 04/20/2021 CLINICAL DATA:  Jaundice.  Fever.  Metastatic breast carcinoma. EXAM: MRI ABDOMEN WITHOUT AND WITH CONTRAST (INCLUDING MRCP) TECHNIQUE: Multiplanar multisequence MR imaging of the abdomen was performed both before and after the administration of intravenous contrast. Heavily T2-weighted images of the biliary and pancreatic ducts were obtained, and three-dimensional MRCP images were rendered by post processing. CONTRAST:  7.11m GADAVIST GADOBUTROL 1 MMOL/ML IV SOLN COMPARISON:  CT on 03/31/2021 FINDINGS: Lower chest: Tiny bilateral pleural effusions. Infiltrate or atelectasis in the posterior lung bases, right side greater than left. Hepatobiliary: Image degradation by motion artifact noted. Innumerable hypovascular masses are seen involving the liver diffusely, consistent with diffuse liver metastases. Largest index lesion located in segment 8 measures 5.1 x 4.2 cm on image 31/23. Comparison with prior CT is limited by differences in modality, without definite change when allowing for technical differences. Prior cholecystectomy. No evidence of biliary obstruction. Pancreas:  No mass or inflammatory changes. Spleen:  Within normal limits in size and appearance.  Adrenals/Urinary Tract: No masses identified. No evidence of hydronephrosis. Stomach/Bowel: Unremarkable. Vascular/Lymphatic: No pathologically enlarged lymph nodes identified. No acute vascular findings. Other: Mild ascites and diffuse mesenteric and body wall edema noted. Musculoskeletal:  No suspicious bone lesions identified. IMPRESSION: Diffuse liver metastases, similar to recent CT. New mild ascites and anasarca. Tiny bilateral pleural effusions, with atelectasis or infiltrate in both lung bases. Prior cholecystectomy.  No evidence of biliary ductal dilatation. Electronically Signed   By: JMarlaine HindM.D.   On: 04/20/2021 14:42   [place]oreaAbdomen Limited RUQ (LIVER/GB)  Result Date: 04/17/2021 CLINICAL DATA:  Hyperbilirubinemia. EXAM: ULTRASOUND ABDOMEN LIMITED RIGHT UPPER QUADRANT COMPARISON:  March 31, 2021. FINDINGS: Gallbladder: Status post cholecystectomy. Common bile duct: Diameter: 8 mm which is within normal limits for post cholecystectomy status. Liver: Multiple hypoechoic abnormalities are noted throughout hepatic parenchyma consistent with metastatic disease. Portal vein is patent on color Doppler imaging with normal direction of blood flow towards the liver. Other: None. IMPRESSION: Status post cholecystectomy. Diffuse hepatic metastatic disease is noted. Electronically Signed   By: JMarijo ConceptionM.D.   On: 04/17/2021 09:36    Assessment and plan-   #Generalized weakness/fatigue, febrile illness Sepsis work-up. COVID 19 negative, influenza negative, UA, Urine culture pending, blood culture pending X-ray showed hypoinflation of lungs with minimal bibasilar subsegmental atelectasis. MRI abdomen MRCP w and wo contrast showed diffuse liver mets, new mild ascites, anasarca,tiny pleural effusions, atelectasis or infiltrate in both lung bases,  no evidence of biliary ductal dilatation Recommend empiric antibiotics.   #Metastatic breast cancer with extensive liver involvement,  hyperbilirubinemia, coagulopathy Prognosis is poor. Chemotherapy with gemcitabine was planned today, will not be given due to her current condition.  Discussed with pathology, ER+, PR- HER2- status came back today.  I had a lengthy discussion with patient and husband. She understands that her prognosis is poor, chemotherapy can not be given until acute infection is ruled out. She is in liver failure due to extensive metastasis, and she may not recover to a condition to be able to receive chemotherapy. She desires trying endocrinotherapy and she understands this may not be effective soon enough to improve her condition.recommend Fulvestruant 579m x 1. Discussed with pharmacist, fulvestrant is not available, and will be ordered to arrive next Monday.  I will start her to Aromasin over the weekend and switch to Fulvestrant on Monday Please consult palliative care service.   Thank you for allowing me to participate in the care of this patient.   ZEarlie Server MD, PhD [facility] 04/20/2021

## 2021-04-20 NOTE — H&P (Signed)
History and Physical    Tammy Elliott KGY:[id number] DOB: Dec 14, 1950 DOA: 04/20/2021  Referring MD/NP/PA:   PCP: Baxter Hire, MD   Patient coming from:  The patient is coming from home.  At baseline, pt is independent for most of ADL.        Chief Complaint: fever  HPI: Tammy Elliott is a 71 y.o. female with medical history significant of breast cancer metastasized to the liver, hyperlipidemia, diabetes mellitus, CAD, stent placement, SMA stenosis, who present with fever.  Patient states that she started having fever and chills yesterday.  She has generalized weakness and fatigue.  She has mild left sided abdominal pain, no nausea, vomiting or abdominal pain.  She had 1 episode of loose stool, currently no active diarrhea.  No chest pain, cough, shortness of breath.  No symptoms of UTI.  Data Reviewed and ED Course: pt was found to have WBC 11.5, lactic acid 3.6, troponin 6, negative COVID PCR, sodium 129, AKI with creatinine 1.39, BUN 37, GFR 41 (recent baseline creatinine 0.67 on 04/16/2021), abnormal liver function (ALP 478, AST 427, ALT 132, total bilirubin 10.8), temperature 100.7, blood pressure 153/63, heart rate 103, RR 22, oxygen saturation 99% on room air.  Checks x-ray showed hypoinflation, no obvious infiltrate injury.  MRCP showed metastasized liver disease, no ductal obstruction.  Patient is admitted to telemetry bed as inpatient  MRCP: Diffuse liver metastases, similar to recent CT.   New mild ascites and anasarca. Tiny bilateral pleural effusions, with atelectasis or infiltrate in both lung bases.  Prior cholecystectomy.  No evidence of biliary ductal dilatation.  EKG: I have personally reviewed.  Sinus rhythm, QTc 461, low voltage, LAE, early R wave progression   Review of Systems:   General: has fevers, chills, no body weight gain, has poor appetite, has fatigue HEENT: no blurry vision, hearing changes or sore throat Respiratory: no dyspnea, coughing,  wheezing CV: no chest pain, no palpitations GI: no nausea, vomiting, has left sided abdominal pain, no diarrhea, constipation GU: no dysuria, burning on urination, increased urinary frequency, hematuria  Ext: no leg edema Neuro: no unilateral weakness, numbness, or tingling, no vision change or hearing loss Skin: no rash, no skin tear. MSK: No muscle spasm, no deformity, no limitation of range of movement in spin Heme: No easy bruising.  Travel history: No recent long distant travel.   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 [facility]) 03/11/2019   a.) Stage IA (cT1b, cN0, cM0, G2, ER+, PR+, HER2-); b.) adjuvant endocrine therapy started 06/2019; c.) s/p total mastectomy, SNLB, and breast reconstruction in 04/2019   Liver cancer ([place])    Skin cancer, basal cell    s/p excision   Superior mesenteric artery stenosis (HCC)    T2DM (type 2 diabetes mellitus) ([place])     Past Surgical History:  Procedure Laterality Date   BREAST BIOPSY Left 03/11/2019   Stereotactic CNB; pathology --> ER/PR (-), HER2/neu (-) IMC iwth lobular features   BREAST RECONSTRUCTION WITH PLACEMENT OF TISSUE EXPANDER AND FLEX HD (ACELLULAR HYDRATED DERMIS) Left 05/03/2019   Procedure: LEFT BREAST RECONSTRUCTION WITH PLACEMENT OF TISSUE EXPANDER AND FLEX HD (ACELLULAR HYDRATED DERMIS);  Surgeon: Wallace Going, DO;  Location: [facility];  Service: Plastics;   Laterality: Left;   CHOLECYSTECTOMY     COLONOSCOPY     CORONARY ANGIOPLASTY WITH STENT PLACEMENT Left 04/12/2010   Procedure: LHC with placement of 2.5 x 15 mm Xience V DES x 1 to pLAD; Location: [place]; Surgeon: Isaias Cowman, MD   EXCISION OF BREAST BIOPSY Left 11/17/2020   Procedure: EXCISION OF BREAST BIOPSY;  Surgeon: Herbert Pun, MD;  Location: [facility];  Service: General;  Laterality: Left;   LIPOSUCTION WITH LIPOFILLING Bilateral 01/27/2020   Procedure: LIPOSUCTION WITH LIPOFILLING;  Surgeon: Wallace Going, DO;  Location: [place];  Service: Plastics;  Laterality: Bilateral;  90 min   MASTOPEXY Right 07/01/2019   Procedure: MASTOPEXY;  Surgeon: Wallace Going, DO;  Location: [place];  Service: Plastics;  Laterality: Right;   REMOVAL OF TISSUE EXPANDER AND PLACEMENT OF IMPLANT Left 07/01/2019   Procedure: REMOVAL OF TISSUE EXPANDER AND PLACEMENT OF IMPLANT;  Surgeon: Wallace Going, DO;  Location: [place];  Service: Plastics;  Laterality: Left;  total case 2.5 hours   TOTAL MASTECTOMY Left 05/03/2019   Procedure: TOTAL MASTECTOMY W/ Sentinel Node;  Surgeon: Herbert Pun, MD;  Location: [facility];  Service: General;  Laterality: Left;   VISCERAL ANGIOGRAPHY N/A 05/01/2020   Procedure: VISCERAL ANGIOGRAPHY;  Surgeon: Algernon Huxley, MD;  Location: [facility];  Service: Cardiovascular;  Laterality: N/A;    Social History:  reports that she has never smoked. She has never used smokeless tobacco. She reports that she does not currently use alcohol. She reports that she does not use drugs.  Family History:  Family History  Problem Relation Age of Onset   CAD Mother    Diabetes Mellitus II Mother    Hypertension Mother    Uterine cancer Mother        dx late 64s   CAD Father    Heart attack Father    Hypertension Father    Diabetes Mellitus II Father        Took insulin   Alcohol  abuse Father    Diabetes Mellitus II Sister    Cancer Sister        possible cervical or uterine   Lung cancer Paternal Aunt    Cancer Paternal Uncle        unk type   Diabetes Other        Almost all sisters are diabetic or pre-diabetic    Cancer Cousin        possible colon/stomach/something in abdomen   Breast cancer Neg Hx      Prior to Admission medications   Medication Sig Start Date End Date Taking? Authorizing Provider  acetaminophen (TYLENOL) 500 MG tablet Take 1,000 mg by mouth every 6 (six) hours as needed for moderate pain.    [provider]  anastrozole (ARIMIDEX) 1 MG tablet Take 1 tablet (1 mg total) by mouth daily. 01/30/21   Earlie Server, MD  CALCIUM-MAGNESIUM-ZINC PO Take 1 tablet by mouth every evening.     [provider]  clopidogrel (PLAVIX) 75  MG tablet Take 75 mg by mouth daily.    [provider]  dexamethasone (DECADRON) 4 MG tablet Take 1 tablet (4 mg total) by mouth daily. 04/16/21   Earlie Server, MD  glucose blood (CONTOUR NEXT TEST) test strip Use once daily e11.65 04/07/20   [provider]  JANUMET 50-500 MG tablet Take 1 tablet by mouth 2 (two) times daily. 04/17/18   [provider]  JARDIANCE 25 MG TABS tablet Take 25 mg by mouth daily.  04/06/18   [provider]  lansoprazole (PREVACID) 15 MG capsule Take 15 mg by mouth daily as needed (acid reflux). 04/17/18   [provider]  lovastatin (MEVACOR) 40 MG tablet Take 40 mg by mouth every evening. 04/17/18   [provider]  metFORMIN (GLUCOPHAGE) 500 MG tablet Take 500 mg by mouth 2 (two) times daily.  04/17/18   [provider]  Morphine Sulfate (MORPHINE CONCENTRATE) 10 mg / 0.5 ml concentrated solution Take 0.5 mLs (10 mg total) by mouth every 6 (six) hours as needed for severe pain. 04/19/21   Earlie Server, MD  Multiple Vitamin (MULTIVITAMIN) tablet Take 1 tablet by mouth daily.    [provider]  ondansetron (ZOFRAN) 8 MG tablet  Take 1 tablet (8 mg total) by mouth 2 (two) times daily as needed (Nausea or vomiting). 04/17/21   Earlie Server, MD  pioglitazone (ACTOS) 30 MG tablet Take 30 mg by mouth daily. 03/16/21   [provider]  prochlorperazine (COMPAZINE) 10 MG tablet Take 1 tablet (10 mg total) by mouth every 6 (six) hours as needed (Nausea or vomiting). 04/17/21   Earlie Server, MD  Vitamin D, Cholecalciferol, 25 MCG (1000 UT) CAPS Take 1 daily 04/02/21   Shary Key, DO    Physical Exam: Vitals:   04/20/21 0829 04/20/21 1048 04/20/21 1243 04/20/21 1531  BP:  135/63 132/62 (!) 149/66  Pulse:  (!) 103 100 (!) 102  Resp:  '20 20 18  ' Temp:    98.8 F (37.1 C)  TempSrc:    Oral  SpO2:  99% 100% 99%  Weight: 77.1 kg     Height: '5\' 8"'  (1.727 m)      General: Not in acute distress HEENT:       Eyes: PERRL, EOMI,  has jaundice and scleral icterus.       ENT: No discharge from the ears and nose, no pharynx injection, no tonsillar enlargement.        Neck: No JVD, no bruit, no mass felt. Heme: No neck lymph node enlargement. Cardiac: S1/S2, RRR, No murmurs, No gallops or rubs. Respiratory: No rales, wheezing, rhonchi or rubs. GI: Soft, nondistended, has mild tenderness in the left side of abdomen, no rebound pain, no organomegaly, BS present. GU: No hematuria Ext: No pitting leg edema bilaterally. 1+DP/PT pulse bilaterally. Musculoskeletal: No joint deformities, No joint redness or warmth, no limitation of ROM in spin. Skin: No rashes.  Neuro: Alert, oriented X3, cranial nerves II-XII grossly intact, moves all extremities normally.  Psych: Patient is not psychotic, no suicidal or hemocidal ideation.  Labs on Admission: I have personally reviewed following labs and imaging studies  CBC: Recent Labs  Lab 04/16/21 0941 04/20/21 0829  WBC 7.9 11.5*  NEUTROABS 5.9 8.9*  HGB 12.2 10.6*  HCT 38.8 33.2*  MCV 99.0 98.8  PLT 399 035   Basic Metabolic Panel: Recent Labs  Lab 04/16/21 0941 04/20/21 1106   NA 132* 129*  K 4.2 4.7  CL 99 97*  CO2 24 22  GLUCOSE 138* 368*  BUN 15 37*  CREATININE 0.67 1.39*  CALCIUM 8.8* 8.1*   GFR: Estimated Creatinine Clearance: 42.3 mL/min (A) (by C-G formula based on SCr of 1.39 mg/dL (H)). Liver Function Tests: Recent Labs  Lab 04/16/21 0941 04/20/21 1106  AST 323* 427*  ALT 121* 132*  ALKPHOS 636* 478*  BILITOT 7.3* 10.8*  PROT 7.3 6.0*  ALBUMIN 2.6* 2.0*   No results for input(s): LIPASE, AMYLASE in the last 168 hours. No results for input(s): AMMONIA in the last 168 hours. Coagulation Profile: Recent Labs  Lab 04/20/21 1106  INR 1.6*   Cardiac Enzymes: No results for input(s): CKTOTAL, CKMB, CKMBINDEX, TROPONINI in the last 168 hours. BNP (last 3 results) No results for input(s): PROBNP in the last 8760 hours. HbA1C: No results for input(s): HGBA1C in the last 72 hours. CBG: Recent Labs  Lab 04/20/21 1554  GLUCAP 332*   Lipid Profile: No results for input(s): CHOL, HDL, LDLCALC, TRIG, CHOLHDL, LDLDIRECT in the last 72 hours. Thyroid Function Tests: No results for input(s): TSH, T4TOTAL, FREET4, T3FREE, THYROIDAB in the last 72 hours. Anemia Panel: No results for input(s): VITAMINB12, FOLATE, FERRITIN, TIBC, IRON, RETICCTPCT in the last 72 hours. Urine analysis:    Component Value Date/Time   COLORURINE YELLOW 03/31/2021 1235   APPEARANCEUR CLEAR 03/31/2021 1235   LABSPEC 1.026 03/31/2021 1235   PHURINE 5.0 03/31/2021 1235   GLUCOSEU >=500 (A) 03/31/2021 1235   HGBUR NEGATIVE 03/31/2021 1235   BILIRUBINUR NEGATIVE 03/31/2021 1235   KETONESUR NEGATIVE 03/31/2021 1235   PROTEINUR NEGATIVE 03/31/2021 1235   UROBILINOGEN 0.2 04/19/2010 1901   NITRITE NEGATIVE 03/31/2021 1235   LEUKOCYTESUR NEGATIVE 03/31/2021 1235   Sepsis Labs: '@LABRCNTIP' (procalcitonin:4,lacticidven:4) ) Recent Results (from the past 240 hour(s))  Resp Panel by RT-PCR (Flu A&B, Covid) Nasopharyngeal Swab     Status: None   Collection Time:  04/20/21  8:29 AM   Specimen: Nasopharyngeal Swab; Nasopharyngeal(NP) swabs in vial transport medium  Result Value Ref Range Status   SARS Coronavirus 2 by RT PCR NEGATIVE NEGATIVE Final    Comment: (NOTE) SARS-CoV-2 target nucleic acids are NOT DETECTED.  The SARS-CoV-2 RNA is generally detectable in upper respiratory specimens during the acute phase of infection. The lowest concentration of SARS-CoV-2 viral copies this assay can detect is 138 copies/mL. A negative result does not preclude SARS-Cov-2 infection and should not be used as the sole basis for treatment or other patient management decisions. A negative result may occur with  improper specimen collection/handling, submission of specimen other than nasopharyngeal swab, presence of viral mutation(s) within the areas targeted by this assay, and inadequate number of viral copies(<138 copies/mL). A negative result must be combined with clinical observations, patient history, and epidemiological information. The expected result is Negative.  Fact Sheet for Patients:  EntrepreneurPulse.com.au  Fact Sheet for Healthcare Providers:  IncredibleEmployment.be  This test is no t yet approved or cleared by the [place] FDA and  has been authorized for detection and/or diagnosis of SARS-CoV-2 by FDA under an Emergency Use Authorization (EUA). This EUA will remain  in effect (meaning this test can be used) for the duration of the COVID-19 declaration under Section 564(b)(1) of the Act, 21 U.S.C.section 360bbb-3(b)(1), unless the authorization is terminated  or revoked sooner.       Influenza A by PCR NEGATIVE NEGATIVE Final   Influenza B by PCR NEGATIVE NEGATIVE Final    Comment: (NOTE) The Xpert  Xpress SARS-CoV-2/FLU/RSV plus assay is intended as an aid in the diagnosis of influenza from Nasopharyngeal swab specimens and should not be used as a sole basis for treatment. Nasal washings  and aspirates are unacceptable for Xpert Xpress SARS-CoV-2/FLU/RSV testing.  Fact Sheet for Patients: EntrepreneurPulse.com.au  Fact Sheet for Healthcare Providers: IncredibleEmployment.be  This test is not yet approved or cleared by the [place] FDA and has been authorized for detection and/or diagnosis of SARS-CoV-2 by FDA under an Emergency Use Authorization (EUA). This EUA will remain in effect (meaning this test can be used) for the duration of the COVID-19 declaration under Section 564(b)(1) of the Act, 21 U.S.C. section 360bbb-3(b)(1), unless the authorization is terminated or revoked.  Performed at [facility], [address] [place], [place] [address]      Radiological Exams on Admission: MR 3D Recon At Scanner  Result Date: 04/20/2021 CLINICAL DATA:  Jaundice.  Fever.  Metastatic breast carcinoma. EXAM: MRI ABDOMEN WITHOUT AND WITH CONTRAST (INCLUDING MRCP) TECHNIQUE: Multiplanar multisequence MR imaging of the abdomen was performed both before and after the administration of intravenous contrast. Heavily T2-weighted images of the biliary and pancreatic ducts were obtained, and three-dimensional MRCP images were rendered by post processing. CONTRAST:  7.82m GADAVIST GADOBUTROL 1 MMOL/ML IV SOLN COMPARISON:  CT on 03/31/2021 FINDINGS: Lower chest: Tiny bilateral pleural effusions. Infiltrate or atelectasis in the posterior lung bases, right side greater than left. Hepatobiliary: Image degradation by motion artifact noted. Innumerable hypovascular masses are seen involving the liver diffusely, consistent with diffuse liver metastases. Largest index lesion located in segment 8 measures 5.1 x 4.2 cm on image 31/23. Comparison with prior CT is limited by differences in modality, without definite change when allowing for technical differences. Prior cholecystectomy. No evidence of biliary obstruction. Pancreas:  No mass or inflammatory  changes. Spleen:  Within normal limits in size and appearance. Adrenals/Urinary Tract: No masses identified. No evidence of hydronephrosis. Stomach/Bowel: Unremarkable. Vascular/Lymphatic: No pathologically enlarged lymph nodes identified. No acute vascular findings. Other: Mild ascites and diffuse mesenteric and body wall edema noted. Musculoskeletal:  No suspicious bone lesions identified. IMPRESSION: Diffuse liver metastases, similar to recent CT. New mild ascites and anasarca. Tiny bilateral pleural effusions, with atelectasis or infiltrate in both lung bases. Prior cholecystectomy.  No evidence of biliary ductal dilatation. Electronically Signed   By: JMarlaine HindM.D.   On: 04/20/2021 14:42   DG Chest Port 1 View  Result Date: 04/20/2021 CLINICAL DATA:  Fever. EXAM: PORTABLE CHEST 1 VIEW COMPARISON:  March 31, 2021. FINDINGS: The heart size and mediastinal contours are within normal limits. Hypoinflation of the lungs is noted with minimal bibasilar subsegmental atelectasis. The visualized skeletal structures are unremarkable. IMPRESSION: Hypoinflation of the lungs with minimal bibasilar subsegmental atelectasis. Electronically Signed   By: JMarijo ConceptionM.D.   On: 04/20/2021 08:49   MR ABDOMEN MRCP W WO CONTAST  Result Date: 04/20/2021 CLINICAL DATA:  Jaundice.  Fever.  Metastatic breast carcinoma. EXAM: MRI ABDOMEN WITHOUT AND WITH CONTRAST (INCLUDING MRCP) TECHNIQUE: Multiplanar multisequence MR imaging of the abdomen was performed both before and after the administration of intravenous contrast. Heavily T2-weighted images of the biliary and pancreatic ducts were obtained, and three-dimensional MRCP images were rendered by post processing. CONTRAST:  7.540mGADAVIST GADOBUTROL 1 MMOL/ML IV SOLN COMPARISON:  CT on 03/31/2021 FINDINGS: Lower chest: Tiny bilateral pleural effusions. Infiltrate or atelectasis in the posterior lung bases, right side greater than left. Hepatobiliary: Image degradation  by motion artifact  noted. Innumerable hypovascular masses are seen involving the liver diffusely, consistent with diffuse liver metastases. Largest index lesion located in segment 8 measures 5.1 x 4.2 cm on image 31/23. Comparison with prior CT is limited by differences in modality, without definite change when allowing for technical differences. Prior cholecystectomy. No evidence of biliary obstruction. Pancreas:  No mass or inflammatory changes. Spleen:  Within normal limits in size and appearance. Adrenals/Urinary Tract: No masses identified. No evidence of hydronephrosis. Stomach/Bowel: Unremarkable. Vascular/Lymphatic: No pathologically enlarged lymph nodes identified. No acute vascular findings. Other: Mild ascites and diffuse mesenteric and body wall edema noted. Musculoskeletal:  No suspicious bone lesions identified. IMPRESSION: Diffuse liver metastases, similar to recent CT. New mild ascites and anasarca. Tiny bilateral pleural effusions, with atelectasis or infiltrate in both lung bases. Prior cholecystectomy.  No evidence of biliary ductal dilatation. Electronically Signed   By: Marlaine Hind M.D.   On: 04/20/2021 14:42      Assessment/Plan Principal Problem:   SIRS (systemic inflammatory response syndrome) (HCC) Active Problems:   CAD (coronary artery disease)   Diabetes mellitus type 2 with complications (HCC)   GERD (gastroesophageal reflux disease)   Hyperlipemia   Superior mesenteric artery stenosis (HCC)   Liver metastasis (HCC)   Metastatic breast cancer (HCC)   Abnormal LFTs   Hyponatremia   AKI (acute kidney injury) ([place])   Jaundice   SIRS (systemic inflammatory response syndrome) ([place]): Patient has SIRS with fever 100.7, heart rate 103, RR 22.  Lactic acid is elevated 3.6 --> 2.6.  No symptoms of pneumonia.  Pending urinalysis. No source of infection identified so far, will treat the patient as SIRS now.  If source of infection is identified later, will change to severe  sepsis.  Patient has abnormal liver function with bilirubin 10.8.  MRCP showed diffuse metastasized liver disease, but no biliary ductal obstruction.  -Admitted to telemetry bed as inpatient -Broad antibiotics: Vancomycin, azithromycin, Flagyl -Follow-up blood culture, urinalysis, urine culture -will get Procalcitonin and trend lactic acid levels per sepsis protocol. -IVF: 2L of NS bolus in ED, followed by 75 cc/h of NS  CAD (coronary artery disease): No CP -Hold lovastatin due to abnormal liver function -Continue Plavix  Diabetes mellitus type 2 with complications [facility]): Recent A1c 7.0.  Patient is taking Amaryl, metformin, Januvia and Jardiance at home -SSI  GERD (gastroesophageal reflux disease) -Protonix  Hyperlipemia -Hold atorvastatin due to abnormal liver function  Superior mesenteric artery stenosis (HCC) -Plavix  Liver metastasis and Metastatic breast cancer ([place]) -consulted Dr. Tasia Catchings of oncology  Abnormal LFTs: Due to metastasized to liver disease -Avoid using Tylenol -Hold lower standing  Hyponatremia: Sodium 129, likely due to decreased oral intake -Check osmo of urine and potassium, urine sodium -IV fluid 2 L normal saline, followed by 75 cc/h of NS -BMP  every 8 hour  AKI (acute kidney injury) ([place]) -IVF as above -Avoid using renal toxic medications    DVT ppx: SQ Heparin    Code Status: DNR (I discussed with patient about code status in the presents with RN, Rollene Rotunda, and explained the meaning of CODE STATUS. Patient wants to be DNR)  Family Communication: I tried to all her husband who did not pick up the phone.  I left a message to him  Disposition Plan:  Anticipate discharge back to previous environment  Consults called:  Dr. Tasia Catchings of oncology  Admission status and Level of care: Telemetry Medical:   as inpt      Severity of Illness:  The appropriate patient status for this patient is INPATIENT. Inpatient status is judged to be reasonable and  necessary in order to provide the required intensity of service to ensure the patient's safety. The patient's presenting symptoms, physical exam findings, and initial radiographic and laboratory data in the context of their chronic comorbidities is felt to place them at high risk for further clinical deterioration. Furthermore, it is not anticipated that the patient will be medically stable for discharge from the hospital within 2 midnights of admission.   * I certify that at the point of admission it is my clinical judgment that the patient will require inpatient hospital care spanning beyond 2 midnights from the point of admission due to high intensity of service, high risk for further deterioration and high frequency of surveillance required.*       Date of Service 04/20/2021    Ivor Costa [facility]   If 7PM-7AM, please contact night-coverage [email or web address] 04/20/2021, 6:40 PM

## 2021-04-21 LAB — CBC
HCT: 30.2 % — ABNORMAL LOW (ref 36.0–46.0)
Hemoglobin: 9.7 g/dL — ABNORMAL LOW (ref 12.0–15.0)
MCH: 31.1 pg (ref 26.0–34.0)
MCHC: 32.1 g/dL (ref 30.0–36.0)
MCV: 96.8 fL (ref 80.0–100.0)
Platelets: 270 10*3/uL (ref 150–400)
RBC: 3.12 MIL/uL — ABNORMAL LOW (ref 3.87–5.11)
RDW: 16.4 % — ABNORMAL HIGH (ref 11.5–15.5)
WBC: 10.4 10*3/uL (ref 4.0–10.5)
nRBC: 0 % (ref 0.0–0.2)

## 2021-04-21 LAB — SURGICAL PCR SCREEN
MRSA, PCR: NEGATIVE
Staphylococcus aureus: POSITIVE — AB

## 2021-04-21 LAB — COMPREHENSIVE METABOLIC PANEL
ALT: 112 U/L — ABNORMAL HIGH (ref 0–44)
AST: 291 U/L — ABNORMAL HIGH (ref 15–41)
Albumin: 2 g/dL — ABNORMAL LOW (ref 3.5–5.0)
Alkaline Phosphatase: 398 U/L — ABNORMAL HIGH (ref 38–126)
Anion gap: 9 (ref 5–15)
BUN: 36 mg/dL — ABNORMAL HIGH (ref 8–23)
CO2: 18 mmol/L — ABNORMAL LOW (ref 22–32)
Calcium: 8 mg/dL — ABNORMAL LOW (ref 8.9–10.3)
Chloride: 100 mmol/L (ref 98–111)
Creatinine, Ser: 1.33 mg/dL — ABNORMAL HIGH (ref 0.44–1.00)
GFR, Estimated: 44 mL/min — ABNORMAL LOW (ref >60)
Glucose, Bld: 269 mg/dL — ABNORMAL HIGH (ref 70–99)
Potassium: 4.3 mmol/L (ref 3.5–5.1)
Sodium: 127 mmol/L — ABNORMAL LOW (ref 135–145)
Total Bilirubin: 12.2 mg/dL — ABNORMAL HIGH (ref 0.3–1.2)
Total Protein: 6 g/dL — ABNORMAL LOW (ref 6.5–8.1)

## 2021-04-21 LAB — BASIC METABOLIC PANEL
Anion gap: 9 (ref 5–15)
BUN: 36 mg/dL — ABNORMAL HIGH (ref 8–23)
CO2: 19 mmol/L — ABNORMAL LOW (ref 22–32)
Calcium: 7.9 mg/dL — ABNORMAL LOW (ref 8.9–10.3)
Chloride: 99 mmol/L (ref 98–111)
Creatinine, Ser: 1.4 mg/dL — ABNORMAL HIGH (ref 0.44–1.00)
GFR, Estimated: 41 mL/min — ABNORMAL LOW (ref >60)
Glucose, Bld: 259 mg/dL — ABNORMAL HIGH (ref 70–99)
Potassium: 4.2 mmol/L (ref 3.5–5.1)
Sodium: 127 mmol/L — ABNORMAL LOW (ref 135–145)

## 2021-04-21 LAB — GLUCOSE, CAPILLARY
Glucose-Capillary: 239 mg/dL — ABNORMAL HIGH (ref 70–99)
Glucose-Capillary: 332 mg/dL — ABNORMAL HIGH (ref 70–99)
Glucose-Capillary: 245 mg/dL — ABNORMAL HIGH (ref 70–99)
Glucose-Capillary: 197 mg/dL — ABNORMAL HIGH (ref 70–99)

## 2021-04-21 LAB — URINALYSIS, COMPLETE (UACMP) WITH MICROSCOPIC
Glucose, UA: 50 mg/dL — AB
Ketones, ur: NEGATIVE mg/dL
Nitrite: NEGATIVE
Protein, ur: 30 mg/dL — AB
Specific Gravity, Urine: 1.02 (ref 1.005–1.030)
WBC, UA: >50 WBC/hpf — ABNORMAL HIGH (ref 0–5)
pH: 5 (ref 5.0–8.0)

## 2021-04-21 LAB — OSMOLALITY, URINE: Osmolality, Ur: 361 mOsm/kg (ref 300–900)

## 2021-04-21 LAB — SODIUM, URINE, RANDOM: Sodium, Ur: <10 mmol/L

## 2021-04-21 MED ORDER — CHLORHEXIDINE GLUCONATE CLOTH 2 % EX PADS
6.0000 | MEDICATED_PAD | Freq: Every day | CUTANEOUS | Status: AC
  Administered 2021-04-21 – 2021-04-25 (×5): 6 via TOPICAL

## 2021-04-21 MED ORDER — HYDROMORPHONE HCL 1 MG/ML IJ SOLN: 0.5000 mg | INTRAMUSCULAR | Status: DC | PRN

## 2021-04-21 MED ORDER — MUPIROCIN 2 % EX OINT
1.0000 "application " | TOPICAL_OINTMENT | Freq: Two times a day (BID) | CUTANEOUS | Status: AC
  Administered 2021-04-22 – 2021-04-25 (×7): 1 via NASAL
  Filled 2021-04-21 (×2): qty 22

## 2021-04-21 MED ORDER — INSULIN ASPART 100 UNIT/ML IJ SOLN
0.0000 [IU] | Freq: Three times a day (TID) | INTRAMUSCULAR | Status: DC
  Administered 2021-04-21: 11 [IU] via SUBCUTANEOUS
  Administered 2021-04-21: 5 [IU] via SUBCUTANEOUS
  Administered 2021-04-22: 2 [IU] via SUBCUTANEOUS
  Administered 2021-04-22: 3 [IU] via SUBCUTANEOUS
  Administered 2021-04-22: 2 [IU] via SUBCUTANEOUS
  Administered 2021-04-23 (×3): 3 [IU] via SUBCUTANEOUS
  Administered 2021-04-24 (×2): 5 [IU] via SUBCUTANEOUS
  Administered 2021-04-25 – 2021-04-26 (×3): 3 [IU] via SUBCUTANEOUS
  Administered 2021-04-26: 2 [IU] via SUBCUTANEOUS
  Filled 2021-04-21 (×12): qty 1

## 2021-04-21 MED ORDER — INSULIN GLARGINE-YFGN 100 UNIT/ML ~~LOC~~ SOLN
10.0000 [IU] | Freq: Every day | SUBCUTANEOUS | Status: DC
  Administered 2021-04-21 – 2021-04-22 (×2): 10 [IU] via SUBCUTANEOUS
  Filled 2021-04-21 (×2): qty 0.1

## 2021-04-21 NOTE — TOC Progression Note (Signed)
Transition of Care [facility]) - Progression Note    Patient Details  Name: Tammy Elliott MRN: [id number] Date of Birth: 1950-12-18  Transition of Care [facility]) CM/SW Contact  Tammy Price, RN Phone Number: 04/21/2021, 2:05 PM  Clinical Narrative:  2/11: Spouse requested hospital bed and BSC/3:1 on discharge. Provider placed orders and [employer] notified via [place]. Spouse's contact number given to set up home delivery likely Monday. [name] RN CM        Barriers to Discharge: Continued Medical Work up  Expected Discharge Plan and Services                           DME Arranged: Hospital bed, 3-N-1 DME Agency: [facility] Date DME Agency Contacted: 04/21/21 Time DME Agency Contacted: [phone] Representative spoke with at DME Agency: [name] ([place]) Interventions    Readmission Risk Interventions No flowsheet data found.

## 2021-04-21 NOTE — Progress Notes (Signed)
PROGRESS NOTE    Tammy Elliott  OZD:[id number] DOB: 04/23/1950 DOA: 04/20/2021 PCP: Baxter Hire, MD    Brief Narrative:   71 y.o. female with medical history significant of breast cancer metastasized to the liver, hyperlipidemia, diabetes mellitus, CAD, stent placement, SMA stenosis, who present with fever.   Patient states that she started having fever and chills yesterday.  She has generalized weakness and fatigue.  She has mild left sided abdominal pain, no nausea, vomiting or abdominal pain.  She had 1 episode of loose stool, currently no active diarrhea.  No chest pain, cough, shortness of breath.  No symptoms of UTI.   Assessment & Plan:   Principal Problem:   SIRS (systemic inflammatory response syndrome) (HCC) Active Problems:   CAD (coronary artery disease)   Diabetes mellitus type 2 with complications (HCC)   GERD (gastroesophageal reflux disease)   Hyperlipemia   Superior mesenteric artery stenosis (HCC)   Liver metastasis (HCC)   Metastatic breast cancer (HCC)   Abnormal LFTs   Hyponatremia   AKI (acute kidney injury) ([place])   Jaundice  SIRS (systemic inflammatory response syndrome) ([place]) Possible UTI  Patient has SIRS with fever 100.7, heart rate 103, RR 22.   Lactic acid is elevated 3.6 --> 2.6.  No symptoms of pneumonia.   Possible urinary tract infection, though urine may be contaminated with stool MRCP with diffuse metastasis however no biliary ductal obstruction or dilatation Plan: Continue Azactam and Flagyl for now Discontinue vancomycin Follow blood and urine cultures IV fluids Monitor vitals and fever curve  Liver metastasis and Metastatic breast cancer ([place]) -consulted Dr. Tasia Catchings of oncology Patient would like to attempt immunotherapy.  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   Patient is taking Amaryl, metformin, Januvia and Jardiance at home -Hold home regimen -Semglee 10 units daily -Moderate sliding scale -Carb modified diet   GERD (gastroesophageal reflux disease) -Protonix   Hyperlipemia -Hold atorvastatin due to abnormal liver function   Superior mesenteric artery stenosis (HCC) -Plavix   Hyponatremia Sodium 129, likely due to decreased oral intake -IV fluid 2 L normal saline, followed by 75 cc/h of NS -BMP daily   AKI (acute kidney injury) ([place]) -IVF as above -Avoid using renal toxic medications   DVT prophylaxis: SQ heparin Code Status: DNR Family Communication: None today Disposition Plan: Status is: Inpatient Remains inpatient appropriate because: Abdominal pain, possible UTI in the setting of metastatic breast cancer with hepatic metastasis and hyperbilirubinemia.  Plan to monitor over the weekend with plan to initiate immunotherapy on Monday 2/13   Level of care: Telemetry Medical  Consultants:  Oncology  Procedures:  None  Antimicrobials: Azactam Flagyl   Subjective: Seen examined resting comfortably in bed.  No visible distress.  No pain complaints.  Objective: Vitals:   04/20/21 2114 04/20/21 2330 04/21/21 0436 04/21/21 0745  BP: (!) 132/59  114/61 (!) 106/50  Pulse: 96  78 78  Resp: 20  18 18   Temp: (!) 100.6 F (38.1 C) 98.9 F (37.2 C) 97.9 F (36.6 C) 98.5 F (36.9 C)  TempSrc:  Oral  Oral   SpO2: 95%  95% 96%  Weight:      Height:        Intake/Output Summary (Last 24 hours) at 04/21/2021 1128 Last data filed at 04/21/2021 1016 Gross per 24 hour  Intake 1391.51 ml  Output --  Net 1391.51 ml   Filed Weights   04/20/21  0829  Weight: 77.1 kg    Examination:  General exam: No acute distress Respiratory system: Lungs clear.  No work breathing.  Room air Cardiovascular system: S1-S2, RRR, no murmurs, no pedal edema Gastrointestinal system: Soft, mild RUQ tenderness, nondistended, positive bowel sounds Central nervous system: Alert and oriented. No focal neurological deficits. Extremities: Symmetric 5 x 5 power. Skin: Diffuse jaundice of skin.  Scleral icterus bilaterally Psychiatry: Judgement and insight appear normal. Mood & affect appropriate.     Data Reviewed: I have personally reviewed following labs and imaging studies  CBC: Recent Labs  Lab 04/16/21 0941 04/20/21 0829 04/21/21 0400  WBC 7.9 11.5* 10.4  NEUTROABS 5.9 8.9*  --   HGB 12.2 10.6* 9.7*  HCT 38.8 33.2* 30.2*  MCV 99.0 98.8 96.8  PLT 399 330 272   Basic Metabolic Panel: Recent Labs  Lab 04/16/21 0941 04/20/21 1106 04/20/21 2003 04/21/21 0400 04/21/21 0523  NA 132* 129* 128* 127* 127*  K 4.2 4.7 4.2 4.3 4.2  CL 99 97* 98 100 99  CO2 24 22 20* 18* 19*  GLUCOSE 138* 368* 336* 269* 259*  BUN 15 37* 32* 36* 36*  CREATININE 0.67 1.39* 1.26* 1.33* 1.40*  CALCIUM 8.8* 8.1* 7.8* 8.0* 7.9*   GFR: Estimated Creatinine Clearance: 42 mL/min (A) (by C-G formula based on SCr of 1.4 mg/dL (H)). Liver Function Tests: Recent Labs  Lab 04/16/21 0941 04/20/21 1106 04/21/21 0400  AST 323* 427* 291*  ALT 121* 132* 112*  ALKPHOS 636* 478* 398*  BILITOT 7.3* 10.8* 12.2*  PROT 7.3 6.0* 6.0*  ALBUMIN 2.6* 2.0* 2.0*   No results for input(s): LIPASE, AMYLASE in the last 168 hours. No results for input(s): AMMONIA in the last 168 hours. Coagulation Profile: Recent Labs  Lab 04/20/21 1106  INR 1.6*   Cardiac Enzymes: No results for input(s): CKTOTAL, CKMB, CKMBINDEX, TROPONINI in the last 168 hours. BNP (last 3 results) No results for input(s): PROBNP in the last 8760 hours. HbA1C: No results for input(s): HGBA1C in the  last 72 hours. CBG: Recent Labs  Lab 04/20/21 1554 04/20/21 2314 04/21/21 0744  GLUCAP 332* 287* 239*   Lipid Profile: No results for input(s): CHOL, HDL, LDLCALC, TRIG, CHOLHDL, LDLDIRECT in the last 72 hours. Thyroid Function Tests: No results for input(s): TSH, T4TOTAL, FREET4, T3FREE, THYROIDAB in the last 72 hours. Anemia Panel: No results for input(s): VITAMINB12, FOLATE, FERRITIN, TIBC, IRON, RETICCTPCT in the last 72 hours. Sepsis Labs: Recent Labs  Lab 04/20/21 0829 04/20/21 1106 04/20/21 2003  PROCALCITON  --  5.94  --   LATICACIDVEN 3.6* 2.6* 3.3*    Recent Results (from the past 240 hour(s))  Resp Panel by RT-PCR (Flu A&B, Covid) Nasopharyngeal Swab     Status: None   Collection Time: 04/20/21  8:29 AM   Specimen: Nasopharyngeal Swab; Nasopharyngeal(NP) swabs in vial transport medium  Result Value Ref Range Status   SARS Coronavirus 2 by RT PCR NEGATIVE NEGATIVE Final    Comment: (NOTE) SARS-CoV-2 target nucleic acids are NOT DETECTED.  The SARS-CoV-2 RNA is generally detectable in upper respiratory specimens during the acute phase of  infection. The lowest concentration of SARS-CoV-2 viral copies this assay can detect is 138 copies/mL. A negative result does not preclude SARS-Cov-2 infection and should not be used as the sole basis for treatment or other patient management decisions. A negative result may occur with  improper specimen collection/handling, submission of specimen other than nasopharyngeal swab, presence of viral mutation(s) within the areas targeted by this assay, and inadequate number of viral copies(<138 copies/mL). A negative result must be combined with clinical observations, patient history, and epidemiological information. The expected result is Negative.  Fact Sheet for Patients:  EntrepreneurPulse.com.au  Fact Sheet for Healthcare Providers:  IncredibleEmployment.be  This test is no t yet  approved or cleared by the [place] FDA and  has been authorized for detection and/or diagnosis of SARS-CoV-2 by FDA under an Emergency Use Authorization (EUA). This EUA will remain  in effect (meaning this test can be used) for the duration of the COVID-19 declaration under Section 564(b)(1) of the Act, 21 U.S.C.section 360bbb-3(b)(1), unless the authorization is terminated  or revoked sooner.       Influenza A by PCR NEGATIVE NEGATIVE Final   Influenza B by PCR NEGATIVE NEGATIVE Final    Comment: (NOTE) The Xpert Xpress SARS-CoV-2/FLU/RSV plus assay is intended as an aid in the diagnosis of influenza from Nasopharyngeal swab specimens and should not be used as a sole basis for treatment. Nasal washings and aspirates are unacceptable for Xpert Xpress SARS-CoV-2/FLU/RSV testing.  Fact Sheet for Patients: EntrepreneurPulse.com.au  Fact Sheet for Healthcare Providers: IncredibleEmployment.be  This test is not yet approved or cleared by the [place] FDA and has been authorized for detection and/or diagnosis of SARS-CoV-2 by FDA under an Emergency Use Authorization (EUA). This EUA will remain in effect (meaning this test can be used) for the duration of the COVID-19 declaration under Section 564(b)(1) of the Act, 21 U.S.C. section 360bbb-3(b)(1), unless the authorization is terminated or revoked.  Performed at [facility], [address] [place], [place] [address]   Culture, blood (Routine x 2)     Status: None (Preliminary result)   Collection Time: 04/20/21  8:34 AM   Specimen: BLOOD  Result Value Ref Range Status   Specimen Description BLOOD RH  Final   Special Requests   Final    BOTTLES DRAWN AEROBIC AND ANAEROBIC Blood Culture results may not be optimal due to an inadequate volume of blood received in culture bottles   Culture   Final    NO GROWTH < 24 HOURS Performed at [facility], [address] [place], [place] [address]    Report Status PENDING  Incomplete  Culture, blood (Routine x 2)     Status: None (Preliminary result)   Collection Time: 04/20/21  8:39 AM   Specimen: BLOOD  Result Value Ref Range Status   Specimen Description BLOOD RH  Final   Special Requests   Final    BOTTLES DRAWN AEROBIC AND ANAEROBIC Blood Culture results may not be optimal due to an inadequate volume of blood received in culture bottles   Culture   Final    NO GROWTH < 24 HOURS Performed at [facility], [address], [place], [place] [address]    Report Status PENDING  Incomplete  Surgical pcr screen     Status: Abnormal   Collection Time: 04/21/21  7:50 AM   Specimen: Nasal Mucosa; Nasal Swab  Result Value Ref Range Status   MRSA, PCR NEGATIVE NEGATIVE Final  Staphylococcus aureus POSITIVE (A) NEGATIVE Final    Comment: (NOTE) The Xpert SA Assay (FDA approved for NASAL specimens in patients 48 years of age and older), is one component of a comprehensive surveillance program. It is not intended to diagnose infection nor to guide or monitor treatment. Performed at [facility], [address] [place], [place] [address]          Radiology Studies: MR 3D Recon At Scanner  Result Date: 04/20/2021 CLINICAL DATA:  Jaundice.  Fever.  Metastatic breast carcinoma. EXAM: MRI ABDOMEN WITHOUT AND WITH CONTRAST (INCLUDING MRCP) TECHNIQUE: Multiplanar multisequence MR imaging of the abdomen was performed both before and after the administration of intravenous contrast. Heavily T2-weighted images of the biliary and pancreatic ducts were obtained, and three-dimensional MRCP images were rendered by post processing. CONTRAST:  7.30mL GADAVIST GADOBUTROL 1 MMOL/ML IV SOLN COMPARISON:  CT on 03/31/2021 FINDINGS: Lower chest: Tiny bilateral pleural effusions. Infiltrate or atelectasis in the posterior lung bases, right side greater than left. Hepatobiliary: Image degradation by motion  artifact noted. Innumerable hypovascular masses are seen involving the liver diffusely, consistent with diffuse liver metastases. Largest index lesion located in segment 8 measures 5.1 x 4.2 cm on image 31/23. Comparison with prior CT is limited by differences in modality, without definite change when allowing for technical differences. Prior cholecystectomy. No evidence of biliary obstruction. Pancreas:  No mass or inflammatory changes. Spleen:  Within normal limits in size and appearance. Adrenals/Urinary Tract: No masses identified. No evidence of hydronephrosis. Stomach/Bowel: Unremarkable. Vascular/Lymphatic: No pathologically enlarged lymph nodes identified. No acute vascular findings. Other: Mild ascites and diffuse mesenteric and body wall edema noted. Musculoskeletal:  No suspicious bone lesions identified. IMPRESSION: Diffuse liver metastases, similar to recent CT. New mild ascites and anasarca. Tiny bilateral pleural effusions, with atelectasis or infiltrate in both lung bases. Prior cholecystectomy.  No evidence of biliary ductal dilatation. Electronically Signed   By: Marlaine Hind M.D.   On: 04/20/2021 14:42   DG Chest Port 1 View  Result Date: 04/20/2021 CLINICAL DATA:  Fever. EXAM: PORTABLE CHEST 1 VIEW COMPARISON:  March 31, 2021. FINDINGS: The heart size and mediastinal contours are within normal limits. Hypoinflation of the lungs is noted with minimal bibasilar subsegmental atelectasis. The visualized skeletal structures are unremarkable. IMPRESSION: Hypoinflation of the lungs with minimal bibasilar subsegmental atelectasis. Electronically Signed   By: Marijo Conception M.D.   On: 04/20/2021 08:49   MR ABDOMEN MRCP W WO CONTAST  Result Date: 04/20/2021 CLINICAL DATA:  Jaundice.  Fever.  Metastatic breast carcinoma. EXAM: MRI ABDOMEN WITHOUT AND WITH CONTRAST (INCLUDING MRCP) TECHNIQUE: Multiplanar multisequence MR imaging of the abdomen was performed both before and after the administration  of intravenous contrast. Heavily T2-weighted images of the biliary and pancreatic ducts were obtained, and three-dimensional MRCP images were rendered by post processing. CONTRAST:  7.30mL GADAVIST GADOBUTROL 1 MMOL/ML IV SOLN COMPARISON:  CT on 03/31/2021 FINDINGS: Lower chest: Tiny bilateral pleural effusions. Infiltrate or atelectasis in the posterior lung bases, right side greater than left. Hepatobiliary: Image degradation by motion artifact noted. Innumerable hypovascular masses are seen involving the liver diffusely, consistent with diffuse liver metastases. Largest index lesion located in segment 8 measures 5.1 x 4.2 cm on image 31/23. Comparison with prior CT is limited by differences in modality, without definite change when allowing for technical differences. Prior cholecystectomy. No evidence of biliary obstruction. Pancreas:  No mass or inflammatory changes. Spleen:  Within normal limits in size and appearance. Adrenals/Urinary  Tract: No masses identified. No evidence of hydronephrosis. Stomach/Bowel: Unremarkable. Vascular/Lymphatic: No pathologically enlarged lymph nodes identified. No acute vascular findings. Other: Mild ascites and diffuse mesenteric and body wall edema noted. Musculoskeletal:  No suspicious bone lesions identified. IMPRESSION: Diffuse liver metastases, similar to recent CT. New mild ascites and anasarca. Tiny bilateral pleural effusions, with atelectasis or infiltrate in both lung bases. Prior cholecystectomy.  No evidence of biliary ductal dilatation. Electronically Signed   By: Marlaine Hind M.D.   On: 04/20/2021 14:42        Scheduled Meds:  calcium-vitamin D   Oral QPM   cholecalciferol  1,000 Units Oral Daily   clopidogrel  75 mg Oral Daily   exemestane  25 mg Oral QPC breakfast   [START ON 04/23/2021] fulvestrant  500 mg Intramuscular Once   heparin  5,000 Units Subcutaneous Q8H   insulin aspart  0-5 Units Subcutaneous QHS   insulin aspart  0-9 Units Subcutaneous  TID WC   multivitamin with minerals  1 tablet Oral Daily   pantoprazole  20 mg Oral Daily   Continuous Infusions:  sodium chloride 100 mL/hr at 04/21/21 0756   aztreonam 200 mL/hr at 04/21/21 0459   metronidazole 500 mg (04/21/21 1012)     LOS: 1 day       Sidney Ace, MD [facility]   If 7PM-7AM, please contact night-coverage  04/21/2021, 11:28 AM

## 2021-04-22 LAB — BASIC METABOLIC PANEL
Anion gap: 9 (ref 5–15)
BUN: 39 mg/dL — ABNORMAL HIGH (ref 8–23)
CO2: 16 mmol/L — ABNORMAL LOW (ref 22–32)
Calcium: 7.8 mg/dL — ABNORMAL LOW (ref 8.9–10.3)
Chloride: 103 mmol/L (ref 98–111)
Creatinine, Ser: 1.09 mg/dL — ABNORMAL HIGH (ref 0.44–1.00)
GFR, Estimated: 55 mL/min — ABNORMAL LOW (ref >60)
Glucose, Bld: 141 mg/dL — ABNORMAL HIGH (ref 70–99)
Potassium: 3.7 mmol/L (ref 3.5–5.1)
Sodium: 128 mmol/L — ABNORMAL LOW (ref 135–145)

## 2021-04-22 LAB — HEPATIC FUNCTION PANEL
ALT: 88 U/L — ABNORMAL HIGH (ref 0–44)
AST: 189 U/L — ABNORMAL HIGH (ref 15–41)
Albumin: 1.7 g/dL — ABNORMAL LOW (ref 3.5–5.0)
Alkaline Phosphatase: 362 U/L — ABNORMAL HIGH (ref 38–126)
Bilirubin, Direct: 7.7 mg/dL — ABNORMAL HIGH (ref 0.0–0.2)
Indirect Bilirubin: 3.8 mg/dL — ABNORMAL HIGH (ref 0.3–0.9)
Total Bilirubin: 11.5 mg/dL — ABNORMAL HIGH (ref 0.3–1.2)
Total Protein: 5.7 g/dL — ABNORMAL LOW (ref 6.5–8.1)

## 2021-04-22 LAB — GLUCOSE, CAPILLARY
Glucose-Capillary: 136 mg/dL — ABNORMAL HIGH (ref 70–99)
Glucose-Capillary: 184 mg/dL — ABNORMAL HIGH (ref 70–99)
Glucose-Capillary: 142 mg/dL — ABNORMAL HIGH (ref 70–99)
Glucose-Capillary: 170 mg/dL — ABNORMAL HIGH (ref 70–99)

## 2021-04-22 LAB — PROCALCITONIN: Procalcitonin: 5.12 ng/mL

## 2021-04-22 LAB — VANCOMYCIN, RANDOM: Vancomycin Rm: 9

## 2021-04-22 MED ORDER — ENSURE ENLIVE PO LIQD
237.0000 mL | Freq: Two times a day (BID) | ORAL | Status: DC
  Administered 2021-04-22: 237 mL via ORAL

## 2021-04-22 MED ORDER — LACTATED RINGERS IV SOLN: INTRAVENOUS | Status: DC

## 2021-04-22 MED ORDER — INSULIN GLARGINE-YFGN 100 UNIT/ML ~~LOC~~ SOLN
13.0000 [IU] | Freq: Every day | SUBCUTANEOUS | Status: DC
  Administered 2021-04-23: 13 [IU] via SUBCUTANEOUS
  Filled 2021-04-22 (×2): qty 0.13

## 2021-04-22 MED ORDER — VANCOMYCIN HCL 1750 MG/350ML IV SOLN
1750.0000 mg | Freq: Once | INTRAVENOUS | Status: AC
  Administered 2021-04-22: 1750 mg via INTRAVENOUS
  Filled 2021-04-22: qty 350

## 2021-04-22 MED ORDER — VANCOMYCIN HCL 1250 MG/250ML IV SOLN
1250.0000 mg | INTRAVENOUS | Status: DC
  Filled 2021-04-22: qty 250

## 2021-04-22 NOTE — Consult Note (Signed)
Pharmacy Antibiotic Note  Tammy Elliott is a 71 y.o. female admitted on 04/20/2021 with sepsis.  Pharmacy has been consulted for Vancomycin. Pt received 1750 mg IV vancomycin x 1 in ED on 2/10.   Plan: Will reload pt with vancomycin 1750 mg and continue with 1250 mg q24h Est AUC: 492.1 Used: Scr 1.09, IBW, Vd 0.72 Obtain vanc levels around 4th or 5th dose if continued Monitor renal function and adjust dose as clinically indicated  Height: 5\' 8"  (172.7 cm) Weight: 77.1 kg (170 lb) IBW/kg (Calculated) : 63.9  Temp (24hrs), Avg:99.1 F (37.3 C), Min:98.5 F (36.9 C), Max:99.7 F (37.6 C)  Recent Labs  Lab 04/16/21 0941 04/20/21 0829 04/20/21 1106 04/20/21 2003 04/21/21 0400 04/21/21 0523 04/21/21 2305 04/22/21 0341  WBC 7.9 11.5*  --   --  10.4  --   --   --   CREATININE 0.67  --  1.39* 1.26* 1.33* 1.40*  --  1.09*  LATICACIDVEN  --  3.6* 2.6* 3.3*  --   --   --   --   VANCORANDOM  --   --   --   --   --   --  9  --      Estimated Creatinine Clearance: 54 mL/min (A) (by C-G formula based on SCr of 1.09 mg/dL (H)).    Allergies  Allergen Reactions   Penicillins Hives and Swelling   Exenatide Nausea Only   Orlistat Diarrhea   Thiazide-Type Diuretics     Unknown reaction    Antimicrobials this admission: Vancomycin 2/10 x 1; 2/12 >>  Aztreonam 2/10 >>  Metronidazole 2/10 >> 2/12  Microbiology results: 2/10 BCx: NGTD 2/10 UCx: 50,000 staph aureus 2/11 MRSA PCR: positive  Thank you for allowing pharmacy to be a part of this patients care.  Jawad Wiacek O Valentine Kuechle 04/22/2021 11:00 AM

## 2021-04-22 NOTE — TOC Progression Note (Signed)
Transition of Care [facility]) - Progression Note    Patient Details  Name: Tammy Elliott MRN: [id number] Date of Birth: 1950/04/12  Transition of Care [facility]) CM/SW Contact  Izola Price, RN Phone Number: 04/22/2021, 9:34 AM  Clinical Narrative:  DME rolling walker added to orders. Contacted [employer] and will be delivered along with hospital bed/3:1. [name] RN CM        Barriers to Discharge: Continued Medical Work up  Expected Discharge Plan and Services                           DME Arranged: Gilford Rile rolling DME Agency: [employer] Date DME Agency Contacted: 04/22/21 Time DME Agency Contacted: [phone] Representative spoke with at DME Agency: [place] ([place]) Interventions    Readmission Risk Interventions No flowsheet data found.

## 2021-04-22 NOTE — Progress Notes (Signed)
Initial Nutrition Assessment  DOCUMENTATION CODES:   Not applicable  INTERVENTION:  Provide Ensure Enlive po BID, each supplement provides 350 kcal and 20 grams of protein.  Encourage adequate PO intake.   NUTRITION DIAGNOSIS:   Increased nutrient needs related to chronic illness (cancer) as evidenced by estimated needs.  GOAL:   Patient will meet greater than or equal to 90% of their needs  MONITOR:   PO intake, Supplement acceptance, Skin, Weight trends, Labs, I & O's  REASON FOR ASSESSMENT:   Malnutrition Screening Tool    ASSESSMENT:   71 y.o. female with medical history significant of breast cancer metastasized to the liver, hyperlipidemia, diabetes mellitus, CAD, stent placement, SMA stenosis, who present with fever. Pt presents with SIRS, possible UTI. MRSA screen positive. Pt with liver metastasis and metastatic breast cancer.  Pt undergoing immunotherapy. Meal completion has been 25-100%. Pt with no significant weight loss per weight records. RD to order nutritional supplements to aid in caloric and protein needs. Unable to complete Nutrition-Focused physical exam at this time.   Labs and medications reviewed.   Diet Order:   Diet Order             Diet regular Room service appropriate? Yes; Fluid consistency: Thin  Diet effective now                   EDUCATION NEEDS:   Not appropriate for education at this time  Skin:  Skin Assessment: Reviewed RN Assessment  Last BM:  2/10  Height:   Ht Readings from Last 1 Encounters:  04/20/21 5\' 8"  (1.727 m)    Weight:   Wt Readings from Last 1 Encounters:  04/20/21 77.1 kg   BMI:  Body mass index is 25.85 kg/m.  Estimated Nutritional Needs:   Kcal:  2000-2200  Protein:  100-115 grams  Fluid:  >/= 2 L/day  Tammy Parker, MS, RD, LDN RD pager number/after hours weekend pager number on Am[facility].

## 2021-04-22 NOTE — Progress Notes (Addendum)
PROGRESS NOTE    Tammy Elliott  WNI:[id number] DOB: [birth year] DOA: 04/20/2021 PCP: Baxter Hire, MD    Brief Narrative:   71 y.o. female with medical history significant of breast cancer metastasized to the liver, hyperlipidemia, diabetes mellitus, CAD, stent placement, SMA stenosis, who present with fever.   Patient states that she started having fever and chills yesterday.  She has generalized weakness and fatigue.  She has mild left sided abdominal pain, no nausea, vomiting or abdominal pain.  She had 1 episode of loose stool, currently no active diarrhea.  No chest pain, cough, shortness of breath.  No symptoms of UTI.   Assessment & Plan:   Principal Problem:   SIRS (systemic inflammatory response syndrome) (HCC) Active Problems:   CAD (coronary artery disease)   Diabetes mellitus type 2 with complications (HCC)   GERD (gastroesophageal reflux disease)   Hyperlipemia   Superior mesenteric artery stenosis (HCC)   Liver metastasis (HCC)   Metastatic breast cancer (HCC)   Abnormal LFTs   Hyponatremia   AKI (acute kidney injury) ([place])   Jaundice  SIRS (systemic inflammatory response syndrome) ([place]) Possible UTI  Patient has SIRS with fever 100.7, heart rate 103, RR 22.   Lactic acid is elevated 3.6 --> 2.6.  No symptoms of pneumonia.   Possible urinary tract infection, though urine may be contaminated with stool MRCP with diffuse metastasis however no biliary ductal obstruction or dilatation -Procalcitonin elevated to 5.94 though this may be impacted by underlying malignancy -MRSA screen positive Plan: Recheck procalcitonin Continue Azactam and Flagyl for now If procalcitonin uptrending we will add back MRSA coverage Follow blood and urine cultures, no growth to date Continue IV fluids for today Monitor vitals and fever curve  Liver metastasis and Metastatic breast cancer ([place]) -consulted Dr. Tasia Catchings of oncology Patient would like to attempt immunotherapy.  Plan to  initiate immunotherapy over the weekend and change regimen on Monday 2/13 -We will reach out to Dr. 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   Patient is taking Amaryl, metformin, Januvia and Jardiance at home -Hold home regimen -Semglee 13 units daily -Moderate sliding scale -Carb modified diet   GERD (gastroesophageal reflux disease) -Protonix   Hyperlipemia -Hold atorvastatin due to abnormal liver function   Superior mesenteric artery stenosis (HCC) -Plavix   Hyponatremia Sodium 129, likely due to decreased oral intake -Continue IV fluids for today -BMP daily   AKI (acute kidney injury) ([place]), improved -IVF to be continued -Avoid using renal toxic medications  Metabolic acidosis Worsening over interval Could be secondary to liver failure versus normal saline fluid resuscitation Plan: Change maintenance fluids to lactated Ringer's at 75 cc/h   DVT prophylaxis: SQ heparin Code Status: DNR Family Communication: spouse [name] [phone] on 2/12 Disposition Plan: Status is: Inpatient Remains inpatient appropriate because: Abdominal pain, possible UTI in the setting of metastatic breast cancer with hepatic metastasis and hyperbilirubinemia.  Plan to monitor over the weekend with plan to initiate immunotherapy on Monday 2/13   Level of care: Telemetry Medical  Consultants:  Oncology  Procedures:  None  Antimicrobials: Azactam Flagyl   Subjective: Seen examined resting comfortably in bed.  No  visible distress.  No pain complaints.  Objective: Vitals:   04/21/21 1634 04/21/21 2010 04/22/21 0419  04/22/21 0746  BP: (!) 116/59 (!) 153/64 137/63 118/63  Pulse: 85 93 95 88  Resp: 18 20 20 18   Temp: 98.5 F (36.9 C) 99.5 F (37.5 C) 99.7 F (37.6 C) 98.8 F (37.1 C)  TempSrc:  Oral Oral Oral  SpO2: 95% 97% 95% 94%  Weight:      Height:        Intake/Output Summary (Last 24 hours) at 04/22/2021 0947 Last data filed at 04/22/2021 6387 Gross per 24 hour  Intake 3113.34 ml  Output 500 ml  Net 2613.34 ml   Filed Weights   04/20/21 0829  Weight: 77.1 kg    Examination:  General exam: No acute distress Respiratory system: Lungs clear.  No work breathing.  Room air Cardiovascular system: S1-S2, RRR, no murmurs, no pedal edema Gastrointestinal system: Soft, nondistended, RUQ tenderness, positive bowel sounds, no fluid wave Central nervous system: Alert and oriented. No focal neurological deficits. Extremities: Symmetric 5 x 5 power. Skin: Diffusely jaundiced.  Bilateral scleral icterus Psychiatry: Judgement and insight appear normal. Mood & affect appropriate.     Data Reviewed: I have personally reviewed following labs and imaging studies  CBC: Recent Labs  Lab 04/16/21 0941 04/20/21 0829 04/21/21 0400  WBC 7.9 11.5* 10.4  NEUTROABS 5.9 8.9*  --   HGB 12.2 10.6* 9.7*  HCT 38.8 33.2* 30.2*  MCV 99.0 98.8 96.8  PLT 399 330 564   Basic Metabolic Panel: Recent Labs  Lab 04/20/21 1106 04/20/21 2003 04/21/21 0400 04/21/21 0523 04/22/21 0341  NA 129* 128* 127* 127* 128*  K 4.7 4.2 4.3 4.2 3.7  CL 97* 98 100 99 103  CO2 22 20* 18* 19* 16*  GLUCOSE 368* 336* 269* 259* 141*  BUN 37* 32* 36* 36* 39*  CREATININE 1.39* 1.26* 1.33* 1.40* 1.09*  CALCIUM 8.1* 7.8* 8.0* 7.9* 7.8*   GFR: Estimated Creatinine Clearance: 54 mL/min (A) (by C-G formula based on SCr of 1.09 mg/dL (H)). Liver Function Tests: Recent Labs  Lab 04/16/21 0941 04/20/21 1106 04/21/21 0400 04/22/21 0341  AST 323* 427* 291* 189*  ALT 121* 132* 112* 88*  ALKPHOS 636* 478* 398* 362*   BILITOT 7.3* 10.8* 12.2* 11.5*  PROT 7.3 6.0* 6.0* 5.7*  ALBUMIN 2.6* 2.0* 2.0* 1.7*   No results for input(s): LIPASE, AMYLASE in the last 168 hours. No results for input(s): AMMONIA in the last 168 hours. Coagulation Profile: Recent Labs  Lab 04/20/21 1106  INR 1.6*   Cardiac Enzymes: No results for input(s): CKTOTAL, CKMB, CKMBINDEX, TROPONINI in the last 168 hours. BNP (last 3 results) No results for input(s): PROBNP in the last 8760 hours. HbA1C: No results for input(s): HGBA1C in the last 72 hours. CBG: Recent Labs  Lab 04/21/21 0744 04/21/21 1137 04/21/21 1706 04/21/21 2112 04/22/21 0747  GLUCAP 239* 332* 245* 197* 136*   Lipid Profile: No results for input(s): CHOL, HDL, LDLCALC, TRIG, CHOLHDL, LDLDIRECT in the last 72 hours. Thyroid Function Tests: No results for input(s): TSH, T4TOTAL, FREET4, T3FREE, THYROIDAB in the last 72 hours. Anemia Panel: No results for input(s): VITAMINB12, FOLATE, FERRITIN, TIBC, IRON, RETICCTPCT in the last 72 hours. Sepsis Labs: Recent Labs  Lab 04/20/21 0829 04/20/21 1106 04/20/21 2003  PROCALCITON  --  5.94  --   LATICACIDVEN 3.6* 2.6* 3.3*    Recent Results (from the past 240 hour(s))  Resp Panel by RT-PCR (Flu  A&B, Covid) Nasopharyngeal Swab     Status: None   Collection Time: 04/20/21  8:29 AM   Specimen: Nasopharyngeal Swab; Nasopharyngeal(NP) swabs in vial transport medium  Result Value Ref Range Status   SARS Coronavirus 2 by RT PCR NEGATIVE NEGATIVE Final    Comment: (NOTE) SARS-CoV-2 target nucleic acids are NOT DETECTED.  The SARS-CoV-2 RNA is generally detectable in upper respiratory specimens during the acute phase of infection. The lowest concentration of SARS-CoV-2 viral copies this assay can detect is 138 copies/mL. A negative result does not preclude SARS-Cov-2 infection and should not be used as the sole basis for treatment or other patient management decisions. A negative result may occur with   improper specimen collection/handling, submission of specimen other than nasopharyngeal swab, presence of viral mutation(s) within the areas targeted by this assay, and inadequate number of viral copies(<138 copies/mL). A negative result must be combined with clinical observations, patient history, and epidemiological information. The expected result is Negative.  Fact Sheet for Patients:  EntrepreneurPulse.com.au  Fact Sheet for Healthcare Providers:  IncredibleEmployment.be  This test is no t yet approved or cleared by the [place] FDA and  has been authorized for detection and/or diagnosis of SARS-CoV-2 by FDA under an Emergency Use Authorization (EUA). This EUA will remain  in effect (meaning this test can be used) for the duration of the COVID-19 declaration under Section 564(b)(1) of the Act, 21 U.S.C.section 360bbb-3(b)(1), unless the authorization is terminated  or revoked sooner.       Influenza A by PCR NEGATIVE NEGATIVE Final   Influenza B by PCR NEGATIVE NEGATIVE Final    Comment: (NOTE) The Xpert Xpress SARS-CoV-2/FLU/RSV plus assay is intended as an aid in the diagnosis of influenza from Nasopharyngeal swab specimens and should not be used as a sole basis for treatment. Nasal washings and aspirates are unacceptable for Xpert Xpress SARS-CoV-2/FLU/RSV testing.  Fact Sheet for Patients: EntrepreneurPulse.com.au  Fact Sheet for Healthcare Providers: IncredibleEmployment.be  This test is not yet approved or cleared by the [place] FDA and has been authorized for detection and/or diagnosis of SARS-CoV-2 by FDA under an Emergency Use Authorization (EUA). This EUA will remain in effect (meaning this test can be used) for the duration of the COVID-19 declaration under Section 564(b)(1) of the Act, 21 U.S.C. section 360bbb-3(b)(1), unless the authorization is terminated  or revoked.  Performed at [facility], [address] [place], [place] [address]   Culture, blood (Routine x 2)     Status: None (Preliminary result)   Collection Time: 04/20/21  8:34 AM   Specimen: BLOOD  Result Value Ref Range Status   Specimen Description BLOOD RH  Final   Special Requests   Final    BOTTLES DRAWN AEROBIC AND ANAEROBIC Blood Culture results may not be optimal due to an inadequate volume of blood received in culture bottles   Culture   Final    NO GROWTH 2 DAYS Performed at [facility], [address], [place], [place] [address]    Report Status PENDING  Incomplete  Culture, blood (Routine x 2)     Status: None (Preliminary result)   Collection Time: 04/20/21  8:39 AM   Specimen: BLOOD  Result Value Ref Range Status   Specimen Description BLOOD RH  Final   Special Requests   Final    BOTTLES DRAWN AEROBIC AND ANAEROBIC Blood Culture results may not be optimal due to an inadequate volume of blood received in culture bottles  Culture   Final    NO GROWTH 2 DAYS Performed at [facility], [address] [place], [place] [address]    Report Status PENDING  Incomplete  Urine Culture     Status: None (Preliminary result)   Collection Time: 04/21/21  3:45 AM   Specimen: Urine, Random  Result Value Ref Range Status   Specimen Description   Final    URINE, RANDOM Performed at [facility], [address], [place], [place] [address]    Special Requests   Final    NONE Performed at [facility], [address], [place], [place] [address]    Culture   Final    CULTURE REINCUBATED FOR BETTER GROWTH Performed at [facility], [place] [address] [place], [place] [address]    Report Status PENDING  Incomplete  Surgical pcr screen     Status: Abnormal   Collection Time: 04/21/21  7:50 AM   Specimen: Nasal Mucosa; Nasal Swab  Result Value Ref Range Status   MRSA, PCR NEGATIVE NEGATIVE Final   Staphylococcus  aureus POSITIVE (A) NEGATIVE Final    Comment: (NOTE) The Xpert SA Assay (FDA approved for NASAL specimens in patients 68 years of age and older), is one component of a comprehensive surveillance program. It is not intended to diagnose infection nor to guide or monitor treatment. Performed at [facility], [address] [place], [place] [address]          Radiology Studies: MR 3D Recon At Scanner  Result Date: 04/20/2021 CLINICAL DATA:  Jaundice.  Fever.  Metastatic breast carcinoma. EXAM: MRI ABDOMEN WITHOUT AND WITH CONTRAST (INCLUDING MRCP) TECHNIQUE: Multiplanar multisequence MR imaging of the abdomen was performed both before and after the administration of intravenous contrast. Heavily T2-weighted images of the biliary and pancreatic ducts were obtained, and three-dimensional MRCP images were rendered by post processing. CONTRAST:  7.50mL GADAVIST GADOBUTROL 1 MMOL/ML IV SOLN COMPARISON:  CT on 03/31/2021 FINDINGS: Lower chest: Tiny bilateral pleural effusions. Infiltrate or atelectasis in the posterior lung bases, right side greater than left. Hepatobiliary: Image degradation by motion artifact noted. Innumerable hypovascular masses are seen involving the liver diffusely, consistent with diffuse liver metastases. Largest index lesion located in segment 8 measures 5.1 x 4.2 cm on image 31/23. Comparison with prior CT is limited by differences in modality, without definite change when allowing for technical differences. Prior cholecystectomy. No evidence of biliary obstruction. Pancreas:  No mass or inflammatory changes. Spleen:  Within normal limits in size and appearance. Adrenals/Urinary Tract: No masses identified. No evidence of hydronephrosis. Stomach/Bowel: Unremarkable. Vascular/Lymphatic: No pathologically enlarged lymph nodes identified. No acute vascular findings. Other: Mild ascites and diffuse mesenteric and body wall edema noted. Musculoskeletal:  No suspicious bone  lesions identified. IMPRESSION: Diffuse liver metastases, similar to recent CT. New mild ascites and anasarca. Tiny bilateral pleural effusions, with atelectasis or infiltrate in both lung bases. Prior cholecystectomy.  No evidence of biliary ductal dilatation. Electronically Signed   By: Marlaine Hind M.D.   On: 04/20/2021 14:42   MR ABDOMEN MRCP W WO CONTAST  Result Date: 04/20/2021 CLINICAL DATA:  Jaundice.  Fever.  Metastatic breast carcinoma. EXAM: MRI ABDOMEN WITHOUT AND WITH CONTRAST (INCLUDING MRCP) TECHNIQUE: Multiplanar multisequence MR imaging of the abdomen was performed both before and after the administration of intravenous contrast. Heavily T2-weighted images of the biliary and pancreatic ducts were obtained, and three-dimensional MRCP images were rendered by post processing. CONTRAST:  7.38mL GADAVIST GADOBUTROL  1 MMOL/ML IV SOLN COMPARISON:  CT on 03/31/2021 FINDINGS: Lower chest: Tiny bilateral pleural effusions. Infiltrate or atelectasis in the posterior lung bases, right side greater than left. Hepatobiliary: Image degradation by motion artifact noted. Innumerable hypovascular masses are seen involving the liver diffusely, consistent with diffuse liver metastases. Largest index lesion located in segment 8 measures 5.1 x 4.2 cm on image 31/23. Comparison with prior CT is limited by differences in modality, without definite change when allowing for technical differences. Prior cholecystectomy. No evidence of biliary obstruction. Pancreas:  No mass or inflammatory changes. Spleen:  Within normal limits in size and appearance. Adrenals/Urinary Tract: No masses identified. No evidence of hydronephrosis. Stomach/Bowel: Unremarkable. Vascular/Lymphatic: No pathologically enlarged lymph nodes identified. No acute vascular findings. Other: Mild ascites and diffuse mesenteric and body wall edema noted. Musculoskeletal:  No suspicious bone lesions identified. IMPRESSION: Diffuse liver metastases, similar  to recent CT. New mild ascites and anasarca. Tiny bilateral pleural effusions, with atelectasis or infiltrate in both lung bases. Prior cholecystectomy.  No evidence of biliary ductal dilatation. Electronically Signed   By: Marlaine Hind M.D.   On: 04/20/2021 14:42        Scheduled Meds:  calcium-vitamin D   Oral QPM   Chlorhexidine Gluconate Cloth  6 each Topical Daily   cholecalciferol  1,000 Units Oral Daily   clopidogrel  75 mg Oral Daily   exemestane  25 mg Oral QPC breakfast   [START ON 04/23/2021] fulvestrant  500 mg Intramuscular Once   heparin  5,000 Units Subcutaneous Q8H   insulin aspart  0-15 Units Subcutaneous TID WC   insulin aspart  0-5 Units Subcutaneous QHS   insulin glargine-yfgn  10 Units Subcutaneous Daily   multivitamin with minerals  1 tablet Oral Daily   mupirocin ointment  1 application Nasal BID   pantoprazole  20 mg Oral Daily   Continuous Infusions:  aztreonam 2 g (04/22/21 0526)   metronidazole 500 mg (04/22/21 0916)     LOS: 2 days       Sidney Ace, MD [facility]   If 7PM-7AM, please contact night-coverage  04/22/2021, 9:47 AM

## 2021-04-23 DIAGNOSIS — R6510 Systemic inflammatory response syndrome (SIRS) of non-infectious origin without acute organ dysfunction: Secondary | ICD-10-CM | POA: Diagnosis not present

## 2021-04-23 DIAGNOSIS — C787 Secondary malignant neoplasm of liver and intrahepatic bile duct: Secondary | ICD-10-CM | POA: Diagnosis not present

## 2021-04-23 DIAGNOSIS — Z515 Encounter for palliative care: Secondary | ICD-10-CM | POA: Diagnosis not present

## 2021-04-23 DIAGNOSIS — C50919 Malignant neoplasm of unspecified site of unspecified female breast: Secondary | ICD-10-CM | POA: Diagnosis not present

## 2021-04-23 DIAGNOSIS — D689 Coagulation defect, unspecified: Secondary | ICD-10-CM

## 2021-04-23 DIAGNOSIS — R188 Other ascites: Secondary | ICD-10-CM

## 2021-04-23 LAB — COMPREHENSIVE METABOLIC PANEL
ALT: 80 U/L — ABNORMAL HIGH (ref 0–44)
AST: 168 U/L — ABNORMAL HIGH (ref 15–41)
Albumin: 1.8 g/dL — ABNORMAL LOW (ref 3.5–5.0)
Alkaline Phosphatase: 361 U/L — ABNORMAL HIGH (ref 38–126)
Anion gap: 12 (ref 5–15)
BUN: 36 mg/dL — ABNORMAL HIGH (ref 8–23)
CO2: 18 mmol/L — ABNORMAL LOW (ref 22–32)
Calcium: 8.2 mg/dL — ABNORMAL LOW (ref 8.9–10.3)
Chloride: 100 mmol/L (ref 98–111)
Creatinine, Ser: 1.13 mg/dL — ABNORMAL HIGH (ref 0.44–1.00)
GFR, Estimated: 53 mL/min — ABNORMAL LOW (ref >60)
Glucose, Bld: 204 mg/dL — ABNORMAL HIGH (ref 70–99)
Potassium: 4.1 mmol/L (ref 3.5–5.1)
Sodium: 130 mmol/L — ABNORMAL LOW (ref 135–145)
Total Bilirubin: 12.3 mg/dL — ABNORMAL HIGH (ref 0.3–1.2)
Total Protein: 6.1 g/dL — ABNORMAL LOW (ref 6.5–8.1)

## 2021-04-23 LAB — URINE CULTURE: Culture: 50000 — AB

## 2021-04-23 LAB — GLUCOSE, CAPILLARY
Glucose-Capillary: 158 mg/dL — ABNORMAL HIGH (ref 70–99)
Glucose-Capillary: 178 mg/dL — ABNORMAL HIGH (ref 70–99)
Glucose-Capillary: 174 mg/dL — ABNORMAL HIGH (ref 70–99)
Glucose-Capillary: 106 mg/dL — ABNORMAL HIGH (ref 70–99)

## 2021-04-23 MED ORDER — ENSURE ENLIVE PO LIQD
237.0000 mL | Freq: Three times a day (TID) | ORAL | Status: DC
  Administered 2021-04-25: 237 mL via ORAL

## 2021-04-23 MED ORDER — LACTATED RINGERS IV SOLN: INTRAVENOUS | Status: DC

## 2021-04-23 MED ORDER — GLUCERNA SHAKE PO LIQD
237.0000 mL | Freq: Three times a day (TID) | ORAL | Status: DC
  Administered 2021-04-23 (×2): 237 mL via ORAL

## 2021-04-23 MED ORDER — SPIRONOLACTONE 25 MG PO TABS: 100.0000 mg | ORAL_TABLET | Freq: Every day | ORAL | Status: DC

## 2021-04-23 MED ORDER — FUROSEMIDE 20 MG PO TABS
20.0000 mg | ORAL_TABLET | Freq: Every day | ORAL | Status: DC
  Administered 2021-04-23 – 2021-04-26 (×4): 20 mg via ORAL
  Filled 2021-04-23 (×4): qty 1

## 2021-04-23 MED ORDER — SPIRONOLACTONE 25 MG PO TABS
50.0000 mg | ORAL_TABLET | Freq: Every day | ORAL | Status: DC
  Administered 2021-04-23 – 2021-04-26 (×4): 50 mg via ORAL
  Filled 2021-04-23 (×4): qty 2

## 2021-04-23 NOTE — Care Management Important Message (Signed)
Important Message  Patient Details  Name: Tammy Elliott MRN: [id number] Date of Birth: 06/16/1950   Medicare Important Message Given:  Yes     Dannette Barbara 04/23/2021, 10:53 AM

## 2021-04-23 NOTE — Progress Notes (Signed)
PROGRESS NOTE    Tammy Elliott  YDX:[id number] DOB: [birth year] DOA: 04/20/2021 PCP: Baxter Hire, MD    Brief Narrative:   71 y.o. female with medical history significant of breast cancer metastasized to the liver, hyperlipidemia, diabetes mellitus, CAD, stent placement, SMA stenosis, who present with fever.   Patient states that she started having fever and chills yesterday.  She has generalized weakness and fatigue.  She has mild left sided abdominal pain, no nausea, vomiting or abdominal pain.  She had 1 episode of loose stool, currently no active diarrhea.  No chest pain, cough, shortness of breath.  No symptoms of UTI.  2/13: Engaged palliative care.  Case discussed at length with palliative care NP Altha Harm.  Recommendations appreciated.  At this point patient would like to speak with Dr. Tasia Catchings to discuss potential efficacy of immunotherapy   Assessment & Plan:   Principal Problem:   SIRS (systemic inflammatory response syndrome) ([place]) Active Problems:   CAD (coronary artery disease)   Diabetes mellitus type 2 with complications (HCC)   GERD (gastroesophageal reflux disease)   Hyperlipemia   Superior mesenteric artery stenosis (HCC)   Liver metastasis (HCC)   Metastatic breast cancer (HCC)   Abnormal LFTs   Hyponatremia   AKI (acute kidney injury) ([place])   Jaundice   Palliative care encounter  SIRS (systemic inflammatory response syndrome) ([place]) Possible UTI  Patient has SIRS with fever 100.7, heart rate 103, RR 22.   Lactic acid is elevated 3.6 --> 2.6.  No symptoms of pneumonia.   Possible urinary tract infection, though urine may be contaminated with stool MRCP with diffuse metastasis however no biliary ductal obstruction or dilatation -MRSA screen negative -Unclear etiology of positive Staph aureus in urine Procalcitonin elevated though likely impacted by underlying malignancy Plan: Continue Azactam DC vancomycin Follow blood and urine cultures, urine  positive for Staph aureus Continue IV fluids for today Monitor vitals and fever curve  Liver metastasis and Metastatic breast cancer ([place]) -consulted Dr. Tasia Catchings of oncology Patient would like to attempt immunotherapy.  Plan to initiate immunotherapy over the weekend and change regimen on Monday 2/13 -Oncology palliative care Dr. Regenia Skeeter has been engaged -Appreciate assistance.  Patient would like to discuss case with Dr. 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   Patient is taking Amaryl, metformin, Januvia and Jardiance at home -Hold home regimen -Semglee 13 units daily -Moderate sliding scale -Carb modified diet   GERD (gastroesophageal reflux disease) -Protonix   Hyperlipemia -Hold atorvastatin due to abnormal liver function   Superior mesenteric artery stenosis (HCC) -Plavix   Hyponatremia Sodium 129 on admission, likely due to decreased oral intake -Continue IV fluids for today -BMP daily   AKI (acute kidney injury) ([place]) -IVF to be continued -Avoid using renal toxic medications  Metabolic acidosis Could be secondary to liver failure versus normal saline fluid resuscitation Plan: maintenance fluids lactated Ringer's at 75 cc/h   DVT prophylaxis: SQ heparin Code Status: DNR Family Communication: spouse [name] [phone] on 2/12 Disposition Plan: Status is: Inpatient Remains inpatient appropriate because: Abdominal pain, possible UTI in the setting of metastatic breast cancer with hepatic metastasis and hyperbilirubinemia.  Pending further treatment options at this point.  Oncology to  follow-up   Level of care: Telemetry Medical  Consultants:  Oncology  Procedures:  None  Antimicrobials: Azactam Flagyl   Subjective: Seen examined resting comfortably in bed.  No visible distress.  No pain complaints.  Objective: Vitals:   04/22/21 1550 04/22/21 2022 04/23/21 0330 04/23/21 0722  BP: 131/72 (!) 172/68 (!) 148/71 (!) 141/65  Pulse: 83 88 91 90  Resp: 18 18 20 20   Temp: 98.3 F (36.8 C) 97.8 F (36.6 C) 98.6 F (37 C) 98.8 F (37.1 C)  TempSrc: Oral Oral  Oral  SpO2: 97% 98% 98% 97%  Weight:      Height:        Intake/Output Summary (Last 24 hours) at 04/23/2021 1125 Last data filed at 04/23/2021 0841 Gross per 24 hour  Intake 2110.5 ml  Output 600 ml  Net 1510.5 ml   Filed Weights   04/20/21 0829  Weight: 77.1 kg    Examination:  General exam: No acute distress.  Appears fatigued Respiratory system: Lungs clear.  No work breathing.  Room air Cardiovascular system: S1-S2, RRR, no murmurs, no pedal edema Gastrointestinal system: Soft, nondistended, RUQ tenderness, positive bowel sounds, no fluid wave Central nervous system: Alert and oriented. No focal neurological deficits. Extremities: Symmetric 5 x 5 power. Skin: Diffuse jaundice.  Scleral icterus Psychiatry: Judgement and insight appear normal. Mood & affect appropriate.     Data Reviewed: I have personally reviewed following labs and imaging studies  CBC: Recent Labs  Lab 04/20/21 0829 04/21/21 0400  WBC 11.5* 10.4  NEUTROABS 8.9*  --   HGB 10.6* 9.7*  HCT 33.2* 30.2*  MCV 98.8 96.8  PLT 330 431   Basic Metabolic Panel: Recent Labs  Lab 04/20/21 2003 04/21/21 0400 04/21/21 0523 04/22/21 0341 04/23/21 0526  NA 128* 127* 127* 128* 130*  K 4.2 4.3 4.2 3.7 4.1  CL 98 100 99 103 100  CO2 20* 18* 19* 16* 18*  GLUCOSE 336* 269* 259* 141* 204*  BUN 32* 36* 36* 39* 36*  CREATININE 1.26* 1.33* 1.40* 1.09* 1.13*  CALCIUM 7.8* 8.0* 7.9* 7.8* 8.2*   GFR: Estimated  Creatinine Clearance: 52.1 mL/min (A) (by C-G formula based on SCr of 1.13 mg/dL (H)). Liver Function Tests: Recent Labs  Lab 04/20/21 1106 04/21/21 0400 04/22/21 0341 04/23/21 0526  AST 427* 291* 189* 168*  ALT 132* 112* 88* 80*  ALKPHOS 478* 398* 362* 361*  BILITOT 10.8* 12.2* 11.5* 12.3*  PROT 6.0* 6.0* 5.7* 6.1*  ALBUMIN 2.0* 2.0* 1.7* 1.8*   No results for input(s): LIPASE, AMYLASE in the last 168 hours. No results for input(s): AMMONIA in the last 168 hours. Coagulation Profile: Recent Labs  Lab 04/20/21 1106  INR 1.6*   Cardiac Enzymes: No results for input(s): CKTOTAL, CKMB, CKMBINDEX, TROPONINI in the last 168 hours. BNP (last 3 results) No results for input(s): PROBNP in the last 8760 hours. HbA1C: No results for input(s): HGBA1C in the last 72 hours. CBG: Recent Labs  Lab 04/22/21 1132 04/22/21 1726 04/22/21 2140 04/23/21 0725 04/23/21 1113  GLUCAP 184* 142* 170* 158* 178*   Lipid Profile: No results for input(s): CHOL, HDL, LDLCALC, TRIG, CHOLHDL, LDLDIRECT in the last 72 hours. Thyroid Function Tests: No results for input(s): TSH, T4TOTAL, FREET4, T3FREE, THYROIDAB in the last 72 hours. Anemia Panel: No results for input(s): VITAMINB12, FOLATE, FERRITIN, TIBC, IRON, RETICCTPCT in the last 72 hours. Sepsis Labs: Recent Labs  Lab  04/20/21 0829 04/20/21 1106 04/20/21 2003 04/22/21 0341  PROCALCITON  --  5.94  --  5.12  LATICACIDVEN 3.6* 2.6* 3.3*  --     Recent Results (from the past 240 hour(s))  Resp Panel by RT-PCR (Flu A&B, Covid) Nasopharyngeal Swab     Status: None   Collection Time: 04/20/21  8:29 AM   Specimen: Nasopharyngeal Swab; Nasopharyngeal(NP) swabs in vial transport medium  Result Value Ref Range Status   SARS Coronavirus 2 by RT PCR NEGATIVE NEGATIVE Final    Comment: (NOTE) SARS-CoV-2 target nucleic acids are NOT DETECTED.  The SARS-CoV-2 RNA is generally detectable in upper respiratory specimens during the acute phase of  infection. The lowest concentration of SARS-CoV-2 viral copies this assay can detect is 138 copies/mL. A negative result does not preclude SARS-Cov-2 infection and should not be used as the sole basis for treatment or other patient management decisions. A negative result may occur with  improper specimen collection/handling, submission of specimen other than nasopharyngeal swab, presence of viral mutation(s) within the areas targeted by this assay, and inadequate number of viral copies(<138 copies/mL). A negative result must be combined with clinical observations, patient history, and epidemiological information. The expected result is Negative.  Fact Sheet for Patients:  EntrepreneurPulse.com.au  Fact Sheet for Healthcare Providers:  IncredibleEmployment.be  This test is no t yet approved or cleared by the [place] FDA and  has been authorized for detection and/or diagnosis of SARS-CoV-2 by FDA under an Emergency Use Authorization (EUA). This EUA will remain  in effect (meaning this test can be used) for the duration of the COVID-19 declaration under Section 564(b)(1) of the Act, 21 U.S.C.section 360bbb-3(b)(1), unless the authorization is terminated  or revoked sooner.       Influenza A by PCR NEGATIVE NEGATIVE Final   Influenza B by PCR NEGATIVE NEGATIVE Final    Comment: (NOTE) The Xpert Xpress SARS-CoV-2/FLU/RSV plus assay is intended as an aid in the diagnosis of influenza from Nasopharyngeal swab specimens and should not be used as a sole basis for treatment. Nasal washings and aspirates are unacceptable for Xpert Xpress SARS-CoV-2/FLU/RSV testing.  Fact Sheet for Patients: EntrepreneurPulse.com.au  Fact Sheet for Healthcare Providers: IncredibleEmployment.be  This test is not yet approved or cleared by the [place] FDA and has been authorized for detection and/or diagnosis of SARS-CoV-2  by FDA under an Emergency Use Authorization (EUA). This EUA will remain in effect (meaning this test can be used) for the duration of the COVID-19 declaration under Section 564(b)(1) of the Act, 21 U.S.C. section 360bbb-3(b)(1), unless the authorization is terminated or revoked.  Performed at [facility], [address] [place], [place] [address]   Culture, blood (Routine x 2)     Status: None (Preliminary result)   Collection Time: 04/20/21  8:34 AM   Specimen: BLOOD  Result Value Ref Range Status   Specimen Description BLOOD RH  Final   Special Requests   Final    BOTTLES DRAWN AEROBIC AND ANAEROBIC Blood Culture results may not be optimal due to an inadequate volume of blood received in culture bottles   Culture   Final    NO GROWTH 3 DAYS Performed at [facility], [address] [place], [place] [address]    Report Status PENDING  Incomplete  Culture, blood (Routine x 2)     Status: None (Preliminary result)   Collection Time: 04/20/21  8:39 AM   Specimen: BLOOD  Result Value Ref Range Status  Specimen Description BLOOD RH  Final   Special Requests   Final    BOTTLES DRAWN AEROBIC AND ANAEROBIC Blood Culture results may not be optimal due to an inadequate volume of blood received in culture bottles   Culture   Final    NO GROWTH 3 DAYS Performed at [facility], [address], [place], [place] [address]    Report Status PENDING  Incomplete  Urine Culture     Status: Abnormal   Collection Time: 04/21/21  3:45 AM   Specimen: Urine, Random  Result Value Ref Range Status   Specimen Description   Final    URINE, RANDOM Performed at [facility], [address], [place], [place] [address]    Special Requests   Final    NONE Performed at [facility], [place], [place] [address]    Culture 50,000 COLONIES/mL STAPHYLOCOCCUS AUREUS (A)  Final   Report Status 04/23/2021 FINAL  Final   Organism ID,  Bacteria STAPHYLOCOCCUS AUREUS (A)  Final      Susceptibility   Staphylococcus aureus - MIC*    CIPROFLOXACIN <=0.5 SENSITIVE Sensitive     GENTAMICIN <=0.5 SENSITIVE Sensitive     NITROFURANTOIN <=16 SENSITIVE Sensitive     OXACILLIN <=0.25 SENSITIVE Sensitive     TETRACYCLINE <=1 SENSITIVE Sensitive     VANCOMYCIN <=0.5 SENSITIVE Sensitive     TRIMETH/SULFA <=10 SENSITIVE Sensitive     CLINDAMYCIN <=0.25 SENSITIVE Sensitive     RIFAMPIN <=0.5 SENSITIVE Sensitive     Inducible Clindamycin NEGATIVE Sensitive     * 50,000 COLONIES/mL STAPHYLOCOCCUS AUREUS  Surgical pcr screen     Status: Abnormal   Collection Time: 04/21/21  7:50 AM   Specimen: Nasal Mucosa; Nasal Swab  Result Value Ref Range Status   MRSA, PCR NEGATIVE NEGATIVE Final   Staphylococcus aureus POSITIVE (A) NEGATIVE Final    Comment: (NOTE) The Xpert SA Assay (FDA approved for NASAL specimens in patients 99 years of age and older), is one component of a comprehensive surveillance program. It is not intended to diagnose infection nor to guide or monitor treatment. Performed at [facility], [address], [place], [place] [address]          Radiology Studies: No results found.      Scheduled Meds:  calcium-vitamin D   Oral QPM   Chlorhexidine Gluconate Cloth  6 each Topical Daily   cholecalciferol  1,000 Units Oral Daily   clopidogrel  75 mg Oral Daily   feeding supplement  237 mL Oral BID BM   feeding supplement (GLUCERNA SHAKE)  237 mL Oral TID BM   fulvestrant  500 mg Intramuscular Once   heparin  5,000 Units Subcutaneous Q8H   insulin aspart  0-15 Units Subcutaneous TID WC   insulin aspart  0-5 Units Subcutaneous QHS   insulin glargine-yfgn  13 Units Subcutaneous Daily   multivitamin with minerals  1 tablet Oral Daily   mupirocin ointment  1 application Nasal BID   pantoprazole  20 mg Oral Daily   Continuous Infusions:  aztreonam 2 g (04/23/21 0645)   lactated ringers 100 mL/hr at  04/23/21 1124     LOS: 3 days       Sidney Ace, MD [facility]   If 7PM-7AM, please contact night-coverage  04/23/2021, 11:25 AM

## 2021-04-23 NOTE — Progress Notes (Signed)
Nutrition Follow-up  DOCUMENTATION CODES:   Not applicable  INTERVENTION:   Ensure Enlive po TID, each supplement provides 350 kcal and 20 grams of protein.  MVI po daily   NUTRITION DIAGNOSIS:   Increased nutrient needs related to chronic illness (cancer) as evidenced by estimated needs.  GOAL:   Patient will meet greater than or equal to 90% of their needs -progressing   MONITOR:   PO intake, Supplement acceptance, Skin, Weight trends, Labs, I & O's  ASSESSMENT:   71 y.o. female with medical history significant of breast cancer metastasized to the liver, hyperlipidemia, diabetes mellitus, CAD, stent placement, SMA stenosis, who present with fever. Pt presents with SIRS, possible UTI. MRSA screen positive. Pt with liver metastasis and metastatic breast cancer.  Met with pt in room today. Pt visibly upset today; pt has been told today that she at end-of-life and pt is deciding about hospice care. Pt reports "I am dying". RD provided emotional support for patient; RD will also order consult to spiritual care. Pt reports that she is eating well in hospital. Pt reports that she is able to use her menu and reports that she has been getting help from RN with ordering. Pt reports that she does not like the Glucerna and prefers chocolate Ensure. RD discussed with pt that she does not have to drink any supplements if she doesn't want to but order will be in if she wants one. No new weight since admit. Palliative care following for GOC.   Medications reviewed and include: oscal with D, D3, plavix, heparin, insulin, MVI, protonix, azactam, LRS '@100ml' /hr  Labs reviewed: Na 130(L), K 4.1 wnl, BUN 36(H), creat 1.13(H), tbili 12.3(H) Hgb 9.7(L), Hct 30.2(L) Cbgs- 178, 158 x 24 hrs  NUTRITION - FOCUSED PHYSICAL EXAM:  Flowsheet Row Most Recent Value  Orbital Region No depletion  Upper Arm Region No depletion  Thoracic and Lumbar Region No depletion  Buccal Region No depletion  Temple  Region No depletion  Clavicle Bone Region Mild depletion  Clavicle and Acromion Bone Region Mild depletion  Scapular Bone Region No depletion  Dorsal Hand Mild depletion  Patellar Region Mild depletion  Anterior Thigh Region No depletion  Posterior Calf Region No depletion  Edema (RD Assessment) None  Hair Reviewed  Eyes Reviewed  Mouth Reviewed  Skin Reviewed  [icteric]  Nails Reviewed   Diet Order:   Diet Order             Diet regular Room service appropriate? Yes; Fluid consistency: Thin  Diet effective now                  EDUCATION NEEDS:   Not appropriate for education at this time  Skin:  Skin Assessment: Reviewed RN Assessment  Last BM:  2/12  Height:   Ht Readings from Last 1 Encounters:  04/20/21 '5\' 8"'  (1.727 m)    Weight:   Wt Readings from Last 1 Encounters:  04/20/21 77.1 kg   BMI:  Body mass index is 25.85 kg/m.  Estimated Nutritional Needs:   Kcal:  1800-2100kcal/day  Protein:  90-105g/day  Fluid:  1.7-1.9L/day  Koleen Distance MS, RD, LDN Please refer to [facility] for RD and/or RD on-call/weekend/after hours pager

## 2021-04-23 NOTE — Consult Note (Signed)
[place] at [facility] Telephone:([phone] [phone]   Name: Tammy Elliott Date: 04/23/2021 MRN: [id number]  DOB: February 02, 1951  Patient Care Team: Baxter Hire, MD as PCP - General (Internal Medicine) Leonie Man, MD as PCP - Cardiology (Cardiology) Earlie Server, MD as Consulting Physician (Oncology) Herbert Pun, MD as Consulting Physician (General Surgery) Dillingham, Loel Lofty, DO as Attending Physician (Plastic Surgery) Rico Junker, RN as Registered Nurse Theodore Demark, RN as Registered Nurse    REASON FOR CONSULTATION: Tammy Elliott is a 71 y.o. 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  Patient was admitted to hospital on 04/20/2021 with SIRS/fever.  MRCP showed diffuse metastatic liver disease without biliary ductal obstruction.  Palliative care was consulted up address goals.  SOCIAL HISTORY:     reports that she has never smoked. She has never used smokeless tobacco. She reports that she does not currently use alcohol. She reports that she does not use drugs.  Patient is married and lives at home with her husband.  They have no children.  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 ([place]) 03/11/2019   a.) Stage IA (cT1b, cN0, cM0, G2, ER+, PR+, HER2-); b.) adjuvant endocrine therapy started 06/2019; c.) s/p total mastectomy, SNLB,  and breast reconstruction in 04/2019   Liver cancer ([place])    Skin cancer, basal cell    s/p excision   Superior mesenteric artery stenosis (HCC)    T2DM (type 2 diabetes mellitus) ([place])     PAST SURGICAL HISTORY:  Past Surgical History:  Procedure Laterality Date   BREAST BIOPSY Left 03/11/2019   Stereotactic CNB; pathology --> ER/PR (-), HER2/neu (-) IMC iwth lobular features   BREAST RECONSTRUCTION WITH PLACEMENT OF TISSUE EXPANDER AND FLEX HD (ACELLULAR HYDRATED DERMIS) Left 05/03/2019   Procedure: LEFT BREAST RECONSTRUCTION WITH PLACEMENT OF TISSUE EXPANDER AND FLEX HD (ACELLULAR HYDRATED DERMIS);  Surgeon: Wallace Going, DO;  Location: [facility];  Service: Plastics;  Laterality: Left;   CHOLECYSTECTOMY     COLONOSCOPY     CORONARY ANGIOPLASTY WITH STENT PLACEMENT Left 04/12/2010   Procedure: LHC with placement of 2.5 x 15 mm Xience V DES x 1 to pLAD; Location: [place]; Surgeon: Isaias Cowman, MD   EXCISION OF BREAST BIOPSY Left 11/17/2020   Procedure: EXCISION OF BREAST BIOPSY;  Surgeon: Herbert Pun, MD;  Location: [facility];  Service: General;  Laterality: Left;   LIPOSUCTION WITH LIPOFILLING Bilateral 01/27/2020   Procedure: LIPOSUCTION WITH LIPOFILLING;  Surgeon: Wallace Going, DO;  Location: [place];  Service: Plastics;  Laterality: Bilateral;  90 min   MASTOPEXY Right 07/01/2019   Procedure: MASTOPEXY;  Surgeon: Wallace Going, DO;  Location: [place];  Service: Plastics;  Laterality: Right;   REMOVAL OF TISSUE EXPANDER AND PLACEMENT OF IMPLANT Left 07/01/2019  Procedure: REMOVAL OF TISSUE EXPANDER AND PLACEMENT OF IMPLANT;  Surgeon: Wallace Going, DO;  Location: [place];  Service: Plastics;  Laterality: Left;  total case 2.5 hours   TOTAL MASTECTOMY Left 05/03/2019   Procedure: TOTAL MASTECTOMY W/ Sentinel Node;  Surgeon: Herbert Pun, MD;  Location: [facility];  Service: General;   Laterality: Left;   VISCERAL ANGIOGRAPHY N/A 05/01/2020   Procedure: VISCERAL ANGIOGRAPHY;  Surgeon: Algernon Huxley, MD;  Location: [place] [facility];  Service: Cardiovascular;  Laterality: N/A;    HEMATOLOGY/ONCOLOGY HISTORY:  Oncology History  Malignant neoplasm of upper-outer quadrant of left breast in female, estrogen receptor positive ([place])  03/23/2019 Initial Diagnosis   Malignant neoplasm of upper-outer quadrant of left breast in female, estrogen receptor positive ([place])   03/23/2019 Cancer Staging   Staging form: Breast, AJCC 8th Edition - Clinical: Stage IA (cT1b, cN0, cM0, G2, ER+, PR+, HER2-) - Signed by Earlie Server, MD on 03/23/2019    05/29/2019 Cancer Staging   Staging form: Breast, AJCC 8th Edition - Pathologic: Stage IA (pT2, pN0, cM0, G2, ER+, PR+, HER2-, Oncotype DX score: 19) - Signed by Earlie Server, MD on 05/29/2019    Metastatic breast cancer ([place])  04/16/2021 Initial Diagnosis   Metastatic breast cancer ([place])   04/16/2021 Cancer Staging   Staging form: Breast, AJCC 8th Edition - Clinical stage from 04/16/2021: Stage IV (rcTX, cNX, pM1, GX, ER: Unknown, PR: Unknown, HER2: Unknown) - Signed by Earlie Server, MD on 04/16/2021 Stage prefix: Recurrence Histologic grading system: 3 grade system    04/20/2021 -  Chemotherapy   Patient is on Treatment Plan : Gemcitabine D1,8 q21d       ALLERGIES:  is allergic to penicillins, exenatide, orlistat, and thiazide-type diuretics.  MEDICATIONS:  Current Facility-Administered Medications  Medication Dose Route Frequency Provider Last Rate Last Admin   aztreonam (AZACTAM) 2 g in sodium chloride 0.9 % 100 mL IVPB  2 g Intravenous Q8H Nazari, Walid A, RPH 200 mL/hr at 04/23/21 0645 2 g at 04/23/21 0645   calcium-vitamin D (OSCAL WITH D) 500-5 MG-MCG per tablet   Oral QPM Ivor Costa, MD   1 tablet at 04/22/21 1744   Chlorhexidine Gluconate Cloth 2 % PADS 6 each  6 each Topical Daily Ralene Muskrat B, MD   6 each at 04/22/21 1027    cholecalciferol (VITAMIN D3) tablet 1,000 Units  1,000 Units Oral Daily Ivor Costa, MD   1,000 Units at 04/23/21 0831   clopidogrel (PLAVIX) tablet 75 mg  75 mg Oral Daily Ivor Costa, MD   75 mg at 04/23/21 0831   feeding supplement (ENSURE ENLIVE / ENSURE PLUS) liquid 237 mL  237 mL Oral BID BM Sreenath, Sudheer B, MD   237 mL at 04/22/21 1744   feeding supplement (GLUCERNA SHAKE) (GLUCERNA SHAKE) liquid 237 mL  237 mL Oral TID BM Sreenath, Sudheer B, MD   237 mL at 04/23/21 0840   fulvestrant (FASLODEX) injection 500 mg  500 mg Intramuscular Once Earlie Server, MD       heparin injection 5,000 Units  5,000 Units Subcutaneous Q8H Ivor Costa, MD   5,000 Units at 04/23/21 0645   HYDROmorphone (DILAUDID) injection 0.5 mg  0.5 mg Intravenous Q3H PRN Ralene Muskrat B, MD       ibuprofen (ADVIL) tablet 200 mg  200 mg Oral Q6H PRN Ivor Costa, MD   200 mg at 04/20/21 2253   insulin aspart (novoLOG) injection 0-15 Units  0-15 Units  Subcutaneous TID WC Ralene Muskrat B, MD   3 Units at 04/23/21 0830   insulin aspart (novoLOG) injection 0-5 Units  0-5 Units Subcutaneous QHS Ivor Costa, MD   3 Units at 04/20/21 2349   insulin glargine-yfgn [facility]) injection 13 Units  13 Units Subcutaneous Daily Ralene Muskrat B, MD   13 Units at 04/23/21 0830   lactated ringers infusion   Intravenous Continuous Sreenath, Sudheer B, MD       morphine CONCENTRATE 10 MG/0.5ML oral solution 10 mg  10 mg Oral Q6H PRN Ivor Costa, MD   10 mg at 04/22/21 2136   multivitamin with minerals tablet 1 tablet  1 tablet Oral Daily Ivor Costa, MD   1 tablet at 04/23/21 0831   mupirocin ointment (BACTROBAN) 2 % 1 application  1 application Nasal BID Ralene Muskrat B, MD   1 application at 58/83/25 0831   ondansetron (ZOFRAN) injection 4 mg  4 mg Intravenous Q8H PRN Ivor Costa, MD   4 mg at 04/22/21 2018   pantoprazole (PROTONIX) EC tablet 20 mg  20 mg Oral Daily Ivor Costa, MD   20 mg at 04/23/21 0831   pneumococcal 23 valent vaccine  (PNEUMOVAX-23) injection 0.5 mL  0.5 mL Intramuscular Prior to discharge Ivor Costa, MD       vancomycin (VANCOREADY) IVPB 1250 mg/250 mL  1,250 mg Intravenous Q24H Rauer, Samantha O, RPH        VITAL SIGNS: BP (!) 141/65    Pulse 90    Temp 98.8 F (37.1 C) (Oral)    Resp 20    Ht '5\' 8"'  (1.727 m)    Wt 170 lb (77.1 kg)    SpO2 97%    BMI 25.85 kg/m  Filed Weights   04/20/21 0829  Weight: 170 lb (77.1 kg)    Estimated body mass index is 25.85 kg/m as calculated from the following:   Height as of this encounter: '5\' 8"'  (1.727 m).   Weight as of this encounter: 170 lb (77.1 kg).  LABS: CBC:    Component Value Date/Time   WBC 10.4 04/21/2021 0400   HGB 9.7 (L) 04/21/2021 0400   HCT 30.2 (L) 04/21/2021 0400   PLT 270 04/21/2021 0400   MCV 96.8 04/21/2021 0400   NEUTROABS 8.9 (H) 04/20/2021 0829   LYMPHSABS 1.2 04/20/2021 0829   MONOABS 1.2 (H) 04/20/2021 0829   EOSABS 0.0 04/20/2021 0829   BASOSABS 0.0 04/20/2021 0829   Comprehensive Metabolic Panel:    Component Value Date/Time   NA 130 (L) 04/23/2021 0526   K 4.1 04/23/2021 0526   CL 100 04/23/2021 0526   CO2 18 (L) 04/23/2021 0526   BUN 36 (H) 04/23/2021 0526   CREATININE 1.13 (H) 04/23/2021 0526   GLUCOSE 204 (H) 04/23/2021 0526   CALCIUM 8.2 (L) 04/23/2021 0526   AST 168 (H) 04/23/2021 0526   ALT 80 (H) 04/23/2021 0526   ALKPHOS 361 (H) 04/23/2021 0526   BILITOT 12.3 (H) 04/23/2021 0526   PROT 6.1 (L) 04/23/2021 0526   ALBUMIN 1.8 (L) 04/23/2021 0526    RADIOGRAPHIC STUDIES: DG Chest 2 View  Result Date: 03/31/2021 CLINICAL DATA:  Shortness of breath. EXAM: CHEST - 2 VIEW COMPARISON:  None. FINDINGS: Heart size and mediastinal contours are normal. No pleural effusion or edema identified. No airspace opacities. Visualized osseous structures are unremarkable. IMPRESSION: No active cardiopulmonary abnormalities. Electronically Signed   By: Kerby Moors M.D.   On: 03/31/2021 14:04   CT CHEST  W CONTRAST  Result  Date: 04/01/2021 CLINICAL DATA:  Evaluate for malignancy. Newly discovered liver metastases. EXAM: CT CHEST WITH CONTRAST TECHNIQUE: Multidetector CT imaging of the chest was performed during intravenous contrast administration. RADIATION DOSE REDUCTION: This exam was performed according to the departmental dose-optimization program which includes automated exposure control, adjustment of the mA and/or kV according to patient size and/or use of iterative reconstruction technique. CONTRAST:  160m OMNIPAQUE IOHEXOL 350 MG/ML SOLN COMPARISON:  CT AP 03/31/2021. FINDINGS: Cardiovascular: No significant vascular findings. Normal heart size. No pericardial effusion. Aortic atherosclerosis and coronary artery calcifications. Mediastinum/Nodes: No enlarged mediastinal, hilar, or axillary lymph nodes. Thyroid gland, trachea, and esophagus demonstrate no significant findings. Lungs/Pleura: Scar like density identified within the superior segment of right lower lobe. No pleural effusion, airspace consolidation, or pneumothorax. No suspicious pulmonary nodule or mass identified. Dependent changes are noted within the posterior lung bases. Upper Abdomen: Multifocal liver metastases are again identified involving both lobes of liver. No acute abnormality noted within the imaged portions of the upper abdomen. Musculoskeletal: Status post left mastectomy with implant reconstruction. There is mild skin thickening with subcutaneous soft tissue stranding within the periphery of the reconstructed left breast, image 90/3. IMPRESSION: 1. No evidence for primary lung neoplasm or metastatic disease to the chest. 2. Status post left mastectomy with implant reconstruction. There is mild skin thickening with subcutaneous soft tissue stranding within the periphery of the reconstructed left breast. This is nonspecific and may be related to postsurgical change. 3. Multifocal liver metastases. 4. Coronary artery calcifications. 5. Aortic  Atherosclerosis (ICD10-I70.0). Electronically Signed   By: TKerby MoorsM.D.   On: 04/01/2021 11:42   CT ABDOMEN PELVIS W CONTRAST  Result Date: 03/31/2021 CLINICAL DATA:  Abdominal pain. EXAM: CT ABDOMEN AND PELVIS WITH CONTRAST TECHNIQUE: Multidetector CT imaging of the abdomen and pelvis was performed using the standard protocol following bolus administration of intravenous contrast. RADIATION DOSE REDUCTION: This exam was performed according to the departmental dose-optimization program which includes automated exposure control, adjustment of the mA and/or kV according to patient size and/or use of iterative reconstruction technique. CONTRAST:  767mOMNIPAQUE IOHEXOL 300 MG/ML  SOLN COMPARISON:  03/02/2020 FINDINGS: Lower chest: Visualized lung bases are clear. Hepatobiliary: Extensive ill-defined low-attenuation lesions are identified throughout both lobes of compatible with metastatic disease. This is a new finding when compared with 03/02/2020. Index lesion within right lobe of liver measures 2.8 by 2.1 cm, image 16/3. Index lesion within segment 4 measures 2.4 x 2.0 cm, image 27/3. Index lesion within segment 2/3 measures 2.4 x 2.3 cm, image 25/3. Status post cholecystectomy. No bile duct dilatation. Pancreas: Unremarkable. No pancreatic ductal dilatation or surrounding inflammatory changes. Spleen: Normal in size without focal abnormality. Adrenals/Urinary Tract: Normal adrenal glands. No kidney mass or hydronephrosis identified. No nephrolithiasis. Urinary bladder is unremarkable. Stomach/Bowel: Stomach appears within normal limits. No small bowel wall thickening, inflammation, or distension. No pathologic dilatation of the colon. No obstructing colonic mass identified. Vascular/Lymphatic: Aortic atherosclerosis. No aneurysm. No abdominopelvic adenopathy. Reproductive: Small exophytic calcified fibroid arises off the anterior uterus. Uterus is otherwise unremarkable. No adnexal mass. Other: No free  fluid or fluid collections. No aggressive lytic or sclerotic bone lesions. Musculoskeletal: No acute or significant osseous findings. IMPRESSION: 1. No acute findings identified within the abdomen or pelvis. 2. Extensive ill-defined low-attenuation lesions throughout both lobes of liver compatible with metastatic disease. This is a new finding when compared with previous CT of the abdomen pelvis from 03/02/2020. No primary  neoplasm identified within the abdomen or pelvis. 3. Aortic Atherosclerosis (ICD10-I70.0). Electronically Signed   By: Kerby Moors M.D.   On: 03/31/2021 14:39   MR 3D Recon At Scanner  Result Date: 04/20/2021 CLINICAL DATA:  Jaundice.  Fever.  Metastatic breast carcinoma. EXAM: MRI ABDOMEN WITHOUT AND WITH CONTRAST (INCLUDING MRCP) TECHNIQUE: Multiplanar multisequence MR imaging of the abdomen was performed both before and after the administration of intravenous contrast. Heavily T2-weighted images of the biliary and pancreatic ducts were obtained, and three-dimensional MRCP images were rendered by post processing. CONTRAST:  7.61m GADAVIST GADOBUTROL 1 MMOL/ML IV SOLN COMPARISON:  CT on 03/31/2021 FINDINGS: Lower chest: Tiny bilateral pleural effusions. Infiltrate or atelectasis in the posterior lung bases, right side greater than left. Hepatobiliary: Image degradation by motion artifact noted. Innumerable hypovascular masses are seen involving the liver diffusely, consistent with diffuse liver metastases. Largest index lesion located in segment 8 measures 5.1 x 4.2 cm on image 31/23. Comparison with prior CT is limited by differences in modality, without definite change when allowing for technical differences. Prior cholecystectomy. No evidence of biliary obstruction. Pancreas:  No mass or inflammatory changes. Spleen:  Within normal limits in size and appearance. Adrenals/Urinary Tract: No masses identified. No evidence of hydronephrosis. Stomach/Bowel: Unremarkable. Vascular/Lymphatic:  No pathologically enlarged lymph nodes identified. No acute vascular findings. Other: Mild ascites and diffuse mesenteric and body wall edema noted. Musculoskeletal:  No suspicious bone lesions identified. IMPRESSION: Diffuse liver metastases, similar to recent CT. New mild ascites and anasarca. Tiny bilateral pleural effusions, with atelectasis or infiltrate in both lung bases. Prior cholecystectomy.  No evidence of biliary ductal dilatation. Electronically Signed   By: JMarlaine HindM.D.   On: 04/20/2021 14:42   [place]BIOPSY (LIVER)  Result Date: 04/09/2021 INDICATION: Liver lesions, concern for metastatic breast cancer EXAM: Ultrasound-guided core needle biopsy of liver lesion MEDICATIONS: None. ANESTHESIA/SEDATION: Moderate (conscious) sedation was employed during this procedure. A total of Versed 2 mg and Fentanyl 100 mcg was administered intravenously. Moderate Sedation Time: 12 minutes. The patient's level of consciousness and vital signs were monitored continuously by radiology nursing throughout the procedure under my direct supervision. FLUOROSCOPY TIME:  N/a COMPLICATIONS: None immediate. PROCEDURE: Informed written consent was obtained from the patient after a thorough discussion of the procedural risks, benefits and alternatives. All questions were addressed. Maximal Sterile Barrier Technique was utilized including caps, mask, sterile gowns, sterile gloves, sterile drape, hand hygiene and skin antiseptic. A timeout was performed prior to the initiation of the procedure. The patient was placed supine on the exam table. Limited ultrasound of the liver was performed, demonstrating numerous lesions within the right and left hepatic lobes. It appropriate lesion was identified in the left hepatic lobe amenable for percutaneous biopsy. Skin entry site was marked, the overlying skin was prepped and draped in the standard sterile fashion. Local analgesia was obtained with 1% lidocaine. Using ultrasound guidance,  a 17 gauge introducer needle was advanced towards the target lesion in the left hepatic lobe. Subsequently, core needle biopsy was performed using a 18 gauge core biopsy device x3 passes. Adequate tissue samples were obtained, and submitted in formalin to pathology for further handling. Limited postprocedure imaging demonstrated no hematoma or complicating feature. A clean dressing was placed after manual hemostasis. The patient tolerated the procedure well without immediate complication. IMPRESSION: Successful ultrasound-guided core needle biopsy of focal lesion in the left hepatic lobe. Electronically Signed   By: YAlbin FellingM.D.   On: 04/09/2021 11:07  DG Chest Port 1 View  Result Date: 04/20/2021 CLINICAL DATA:  Fever. EXAM: PORTABLE CHEST 1 VIEW COMPARISON:  March 31, 2021. FINDINGS: The heart size and mediastinal contours are within normal limits. Hypoinflation of the lungs is noted with minimal bibasilar subsegmental atelectasis. The visualized skeletal structures are unremarkable. IMPRESSION: Hypoinflation of the lungs with minimal bibasilar subsegmental atelectasis. Electronically Signed   By: Marijo Conception M.D.   On: 04/20/2021 08:49   MR ABDOMEN MRCP W WO CONTAST  Result Date: 04/20/2021 CLINICAL DATA:  Jaundice.  Fever.  Metastatic breast carcinoma. EXAM: MRI ABDOMEN WITHOUT AND WITH CONTRAST (INCLUDING MRCP) TECHNIQUE: Multiplanar multisequence MR imaging of the abdomen was performed both before and after the administration of intravenous contrast. Heavily T2-weighted images of the biliary and pancreatic ducts were obtained, and three-dimensional MRCP images were rendered by post processing. CONTRAST:  7.67m GADAVIST GADOBUTROL 1 MMOL/ML IV SOLN COMPARISON:  CT on 03/31/2021 FINDINGS: Lower chest: Tiny bilateral pleural effusions. Infiltrate or atelectasis in the posterior lung bases, right side greater than left. Hepatobiliary: Image degradation by motion artifact noted. Innumerable  hypovascular masses are seen involving the liver diffusely, consistent with diffuse liver metastases. Largest index lesion located in segment 8 measures 5.1 x 4.2 cm on image 31/23. Comparison with prior CT is limited by differences in modality, without definite change when allowing for technical differences. Prior cholecystectomy. No evidence of biliary obstruction. Pancreas:  No mass or inflammatory changes. Spleen:  Within normal limits in size and appearance. Adrenals/Urinary Tract: No masses identified. No evidence of hydronephrosis. Stomach/Bowel: Unremarkable. Vascular/Lymphatic: No pathologically enlarged lymph nodes identified. No acute vascular findings. Other: Mild ascites and diffuse mesenteric and body wall edema noted. Musculoskeletal:  No suspicious bone lesions identified. IMPRESSION: Diffuse liver metastases, similar to recent CT. New mild ascites and anasarca. Tiny bilateral pleural effusions, with atelectasis or infiltrate in both lung bases. Prior cholecystectomy.  No evidence of biliary ductal dilatation. Electronically Signed   By: JMarlaine HindM.D.   On: 04/20/2021 14:42   [place]oreaAbdomen Limited RUQ (LIVER/GB)  Result Date: 04/17/2021 CLINICAL DATA:  Hyperbilirubinemia. EXAM: ULTRASOUND ABDOMEN LIMITED RIGHT UPPER QUADRANT COMPARISON:  March 31, 2021. FINDINGS: Gallbladder: Status post cholecystectomy. Common bile duct: Diameter: 8 mm which is within normal limits for post cholecystectomy status. Liver: Multiple hypoechoic abnormalities are noted throughout hepatic parenchyma consistent with metastatic disease. Portal vein is patent on color Doppler imaging with normal direction of blood flow towards the liver. Other: None. IMPRESSION: Status post cholecystectomy. Diffuse hepatic metastatic disease is noted. Electronically Signed   By: JMarijo ConceptionM.D.   On: 04/17/2021 09:36    PERFORMANCE STATUS (ECOG) : 2 - Symptomatic, <50% confined to bed  Review of Systems Unless otherwise  noted, a complete review of systems is negative.  Physical Exam General: NAD Pulmonary: Unlabored Abdomen: soft, nontender, + bowel sounds GU: no suprapubic tenderness Extremities: no edema, no joint deformities Skin: Jaundiced Neurological: Weakness but otherwise nonfocal  IMPRESSION: I met with patient and then husband.  Introduced palliative care services and attempted to establish therapeutic rapport.  Patient states "I know that I am dying."  She has voiced interest in pursuing chemotherapy if there is a chance at meaningful improvement but says that she would forego it if oncology feels like there is "no chance."  She also describes the importance of maintaining her quality of life.  I talked about the option of pursuing hospice care at home if patient ultimately opts to forego chemotherapy.  Patient would like to speak further with the Dr. Tasia Catchings about options/prognosis.  Symptomatically, she currently appears comfortable.  Patient is appropriately DNR.  PLAN: -Continue current scope of treatment -Will discuss case with Dr. Tasia Catchings    Time Total: 30 minutes  Visit consisted of counseling and education dealing with the complex and emotionally intense issues of symptom management and palliative care in the setting of serious and potentially life-threatening illness.Greater than 50%  of this time was spent counseling and coordinating care related to the above assessment and plan.  Signed by: Altha Harm, PhD, NP-C

## 2021-04-23 NOTE — Progress Notes (Signed)
Hematology/Oncology Progress note [phone] [phone]     Patient Care Team: Tammy Hire, MD as PCP - General (Internal Medicine) Tammy Man, MD as PCP - Cardiology (Cardiology) Tammy Server, MD as Consulting Physician (Oncology) Tammy Pun, MD as Consulting Physician (General Surgery) Tammy Elliott, Tammy Lofty, DO as Attending Physician (Plastic Surgery) Tammy Junker, RN as Registered Nurse Tammy Demark, RN as Registered Nurse   Name of the patient: Tammy Elliott  [id number]  [birth year]  Date of visit: 04/23/21   INTERVAL HISTORY-  Patient reports pain is 3 out of 10.  Pain medication is helpful but does not completely take the pain away.  Abdomen is distended..  She is afebrile. No nausea vomiting.  Allergies  Allergen Reactions   Penicillins Hives and Swelling   Exenatide Nausea Only   Orlistat Diarrhea   Thiazide-Type Diuretics     Unknown reaction    Patient Active Problem List   Diagnosis Date Noted   Palliative care encounter    SIRS (systemic inflammatory response syndrome) ([place]) 04/20/2021   Abnormal LFTs 04/20/2021   Hyponatremia 04/20/2021   AKI (acute kidney injury) ([place]) 04/20/2021   Jaundice    Goals of care, counseling/discussion 04/16/2021   Metastatic breast cancer ([place]) 04/16/2021   Transaminitis    Liver metastasis ([place]) 03/31/2021   Family history of uterine cancer 11/01/2020   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   Basal cell carcinoma 03/28/2020   Diabetes mellitus type 2 with complications ([place]) 09/32/3557   Pelvic pain in female 03/28/2020   Abdominal pain 03/28/2020   S/P mastectomy, left 05/11/2019   Malignant neoplasm of upper-outer quadrant of left breast in female, estrogen receptor positive ([place]) 03/23/2019   CAD S/P percutaneous coronary angioplasty 05/04/2018   GERD (gastroesophageal reflux disease) 08/22/2014   CAD (coronary artery disease) 10/20/2013   Hyperlipemia  10/20/2013     Past Medical History:  Diagnosis Date   Aortic atherosclerosis ([place])    CAD (coronary artery disease) 04/12/2010   a.) PCI --> 85% pLAD --> 2.5 x 15 mm Xience V DES x 1   Chronic anticoagulation    Rivaroxaban   Chronic ischemic heart disease    DOE (dyspnea on exertion)    Family history of uterine cancer    GERD (gastroesophageal reflux disease)    Hypercholesterolemia    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   a.) Stage IA (cT1b, cN0, cM0, G2, ER+, PR+, HER2-); b.) adjuvant endocrine therapy started 06/2019; c.) s/p total mastectomy, SNLB, and breast reconstruction in 04/2019   Liver cancer ([place])    Skin cancer, basal cell    s/p excision   Superior mesenteric artery stenosis ([facility])    T2DM (type 2 diabetes mellitus) ([place])      Past Surgical History:  Procedure Laterality Date   BREAST BIOPSY Left 03/11/2019   Stereotactic CNB; pathology --> ER/PR (-), HER2/neu (-) IMC iwth lobular features   BREAST RECONSTRUCTION WITH PLACEMENT OF TISSUE EXPANDER AND FLEX HD (ACELLULAR HYDRATED DERMIS) Left 05/03/2019   Procedure: LEFT BREAST RECONSTRUCTION WITH PLACEMENT OF TISSUE EXPANDER AND FLEX HD (ACELLULAR HYDRATED DERMIS);  Surgeon: Tammy Going, DO;  Location: [facility];  Service: Plastics;  Laterality: Left;   CHOLECYSTECTOMY     COLONOSCOPY     CORONARY ANGIOPLASTY WITH STENT PLACEMENT Left 04/12/2010   Procedure: LHC with placement of 2.5 x 15 mm Xience V DES x 1 to pLAD; Location: [place]; Surgeon: Tammy Cowman,  MD   EXCISION OF BREAST BIOPSY Left 11/17/2020   Procedure: EXCISION OF BREAST BIOPSY;  Surgeon: Tammy Pun, MD;  Location: [facility];  Service: General;  Laterality: Left;   LIPOSUCTION WITH LIPOFILLING Bilateral 01/27/2020   Procedure: LIPOSUCTION WITH LIPOFILLING;  Surgeon: Tammy Going, DO;  Location: [place];  Service: Plastics;  Laterality: Bilateral;  90 min   MASTOPEXY  Right 07/01/2019   Procedure: MASTOPEXY;  Surgeon: Tammy Going, DO;  Location: [place];  Service: Plastics;  Laterality: Right;   REMOVAL OF TISSUE EXPANDER AND PLACEMENT OF IMPLANT Left 07/01/2019   Procedure: REMOVAL OF TISSUE EXPANDER AND PLACEMENT OF IMPLANT;  Surgeon: Tammy Going, DO;  Location: [place];  Service: Plastics;  Laterality: Left;  total case 2.5 hours   TOTAL MASTECTOMY Left 05/03/2019   Procedure: TOTAL MASTECTOMY W/ Sentinel Node;  Surgeon: Tammy Pun, MD;  Location: [facility];  Service: General;  Laterality: Left;   VISCERAL ANGIOGRAPHY N/A 05/01/2020   Procedure: VISCERAL ANGIOGRAPHY;  Surgeon: Tammy Huxley, MD;  Location: [facility];  Service: Cardiovascular;  Laterality: N/A;    Social History   Socioeconomic History   Marital status: Married    Spouse name: Not on file   Number of children: Not on file   Years of education: Not on file   Highest education level: Not on file  Occupational History   Occupation: Analyst    Employer: [employer]  Tobacco Use   Smoking status: Never   Smokeless tobacco: Never  Vaping Use   Vaping Use: Never used  Substance and Sexual Activity   Alcohol use: Not Currently    Comment: rarely   Drug use: Never   Sexual activity: Not on file  Other Topics Concern   Not on file  Social History Narrative   reports that she has never smoked. She has never used smokeless tobacco. She reports that she rarely drinks alcohol. She reports that she does not use drugs.      Married to [employer] (Tammy Elliott) Tammy Elliott    Social Determinants of Health   Financial Resource Strain: Not on file  Food Insecurity: Not on file  Transportation Needs: Not on file  Physical Activity: Not on file  Stress: Not on file  Social Connections: Not on file  Intimate Partner Violence: Not on file     Family History  Problem Relation Age of Onset   CAD Mother    Diabetes Mellitus II  Mother    Hypertension Mother    Uterine cancer Mother        dx late 71s   CAD Father    Heart attack Father    Hypertension Father    Diabetes Mellitus II Father        Took insulin   Alcohol abuse Father    Diabetes Mellitus II Sister    Cancer Sister        possible cervical or uterine   Lung cancer Paternal Aunt    Cancer Paternal Uncle        unk type   Diabetes Other        Almost all sisters are diabetic or pre-diabetic    Cancer Cousin        possible colon/stomach/something in abdomen   Breast cancer Neg Hx      Current Facility-Administered Medications:    aztreonam (AZACTAM) 2 g in sodium chloride 0.9 % 100 mL IVPB, 2 g, Intravenous, Q8H,  Rito Ehrlich A, RPH, Last Rate: 200 mL/hr at 04/23/21 1405, 2 g at 04/23/21 1405   calcium-vitamin D (OSCAL WITH D) 500-5 MG-MCG per tablet, , Oral, QPM, Ivor Costa, MD, 1 tablet at 04/22/21 1744   Chlorhexidine Gluconate Cloth 2 % PADS 6 each, 6 each, Topical, Daily, Priscella Mann, Sudheer B, MD, 6 each at 04/23/21 1121   cholecalciferol (VITAMIN D3) tablet 1,000 Units, 1,000 Units, Oral, Daily, Ivor Costa, MD, 1,000 Units at 04/23/21 0831   clopidogrel (PLAVIX) tablet 75 mg, 75 mg, Oral, Daily, Ivor Costa, MD, 75 mg at 04/23/21 0831   feeding supplement (ENSURE ENLIVE / ENSURE PLUS) liquid 237 mL, 237 mL, Oral, TID BM, Sreenath, Sudheer B, MD   furosemide (LASIX) tablet 20 mg, 20 mg, Oral, Daily, Tammy Server, MD   heparin injection 5,000 Units, 5,000 Units, Subcutaneous, Q8H, Ivor Costa, MD, 5,000 Units at 04/23/21 1403   HYDROmorphone (DILAUDID) injection 0.5 mg, 0.5 mg, Intravenous, Q3H PRN, Priscella Mann, Sudheer B, MD   ibuprofen (ADVIL) tablet 200 mg, 200 mg, Oral, Q6H PRN, Ivor Costa, MD, 200 mg at 04/20/21 2253   insulin aspart (novoLOG) injection 0-15 Units, 0-15 Units, Subcutaneous, TID WC, Sreenath, Sudheer B, MD, 3 Units at 04/23/21 1123   insulin aspart (novoLOG) injection 0-5 Units, 0-5 Units, Subcutaneous, QHS, Ivor Costa, MD, 3  Units at 04/20/21 2349   insulin glargine-yfgn (SEMGLEE) injection 13 Units, 13 Units, Subcutaneous, Daily, Priscella Mann, Sudheer B, MD, 13 Units at 04/23/21 0830   lactated ringers infusion, , Intravenous, Continuous, Sreenath, Sudheer B, MD, Last Rate: 100 mL/hr at 04/23/21 1124, Rate Change at 04/23/21 1124   morphine CONCENTRATE 10 MG/0.5ML oral solution 10 mg, 10 mg, Oral, Q6H PRN, Ivor Costa, MD, 10 mg at 04/23/21 1121   multivitamin with minerals tablet 1 tablet, 1 tablet, Oral, Daily, Ivor Costa, MD, 1 tablet at 04/23/21 0831   mupirocin ointment (BACTROBAN) 2 % 1 application, 1 application, Nasal, BID, Priscella Mann, Sudheer B, MD, 1 application at 83/25/49 0831   ondansetron (ZOFRAN) injection 4 mg, 4 mg, Intravenous, Q8H PRN, Ivor Costa, MD, 4 mg at 04/22/21 2018   pantoprazole (PROTONIX) EC tablet 20 mg, 20 mg, Oral, Daily, Ivor Costa, MD, 20 mg at 04/23/21 0831   pneumococcal 23 valent vaccine (PNEUMOVAX-23) injection 0.5 mL, 0.5 mL, Intramuscular, Prior to discharge, Ivor Costa, MD   spironolactone (ALDACTONE) tablet 50 mg, 50 mg, Oral, Daily, Tammy Server, MD   Physical exam:  Vitals:   04/22/21 1550 04/22/21 2022 04/23/21 0330 04/23/21 0722  BP: 131/72 (!) 172/68 (!) 148/71 (!) 141/65  Pulse: 83 88 91 90  Resp: _0 Temp: 98.3 F (36.8 C) 97.8 F (36.6 C) 98.6 F (37 C) 98.8 F (37.1 C)  TempSrc: Oral Oral  Oral  SpO2: 97% 98% 98% 97%  Weight:      Height:       Physical Exam Constitutional:      General: She is not in acute distress.    Appearance: She is not diaphoretic.  HENT:     Head: Normocephalic and atraumatic.     Nose: Nose normal.     Mouth/Throat:     Pharynx: No oropharyngeal exudate.  Eyes:     General: No scleral icterus.    Pupils: Pupils are equal, round, and reactive to light.  Cardiovascular:     Rate and Rhythm: Normal rate and regular rhythm.     Heart sounds: No murmur heard. Pulmonary:     Effort:  Pulmonary effort is normal. No respiratory  distress.     Breath sounds: No rales.  Chest:     Chest wall: No tenderness.  Abdominal:     General: There is distension.     Palpations: Abdomen is soft.     Tenderness: There is no abdominal tenderness.  Musculoskeletal:        General: Swelling present. Normal range of motion.     Cervical back: Normal range of motion and neck supple.  Skin:    General: Skin is warm and dry.     Coloration: Skin is jaundiced.     Findings: No erythema.  Neurological:     Mental Status: She is alert and oriented to person, place, and time.     Cranial Nerves: No cranial nerve deficit.     Motor: No abnormal muscle tone.     Coordination: Coordination normal.  Psychiatric:        Mood and Affect: Affect normal.       CMP Latest Ref Rng & Units 04/23/2021  Glucose 70 - 99 mg/dL 204(H)  BUN 8 - 23 mg/dL 36(H)  Creatinine 0.44 - 1.00 mg/dL 1.13(H)  Sodium 135 - 145 mmol/L 130(L)  Potassium 3.5 - 5.1 mmol/L 4.1  Chloride 98 - 111 mmol/L 100  CO2 22 - 32 mmol/L 18(L)  Calcium 8.9 - 10.3 mg/dL 8.2(L)  Total Protein 6.5 - 8.1 g/dL 6.1(L)  Total Bilirubin 0.3 - 1.2 mg/dL 12.3(H)  Alkaline Phos 38 - 126 U/L 361(H)  AST 15 - 41 U/L 168(H)  ALT 0 - 44 U/L 80(H)   CBC Latest Ref Rng & Units 04/21/2021  WBC 4.0 - 10.5 K/uL 10.4  Hemoglobin 12.0 - 15.0 g/dL 9.7(L)  Hematocrit 36.0 - 46.0 % 30.2(L)  Platelets 150 - 400 K/uL 270    RADIOGRAPHIC STUDIES: I have personally reviewed the radiological images as listed and agreed with the findings in the report. DG Chest 2 View  Result Date: 03/31/2021 CLINICAL DATA:  Shortness of breath. EXAM: CHEST - 2 VIEW COMPARISON:  None. FINDINGS: Heart size and mediastinal contours are normal. No pleural effusion or edema identified. No airspace opacities. Visualized osseous structures are unremarkable. IMPRESSION: No active cardiopulmonary abnormalities. Electronically Signed   By: Kerby Moors M.D.   On: 03/31/2021 14:04   CT CHEST W CONTRAST  Result  Date: 04/01/2021 CLINICAL DATA:  Evaluate for malignancy. Newly discovered liver metastases. EXAM: CT CHEST WITH CONTRAST TECHNIQUE: Multidetector CT imaging of the chest was performed during intravenous contrast administration. RADIATION DOSE REDUCTION: This exam was performed according to the departmental dose-optimization program which includes automated exposure control, adjustment of the mA and/or kV according to patient size and/or use of iterative reconstruction technique. CONTRAST:  162m OMNIPAQUE IOHEXOL 350 MG/ML SOLN COMPARISON:  CT AP 03/31/2021. FINDINGS: Cardiovascular: No significant vascular findings. Normal heart size. No pericardial effusion. Aortic atherosclerosis and coronary artery calcifications. Mediastinum/Nodes: No enlarged mediastinal, hilar, or axillary lymph nodes. Thyroid gland, trachea, and esophagus demonstrate no significant findings. Lungs/Pleura: Scar like density identified within the superior segment of right lower lobe. No pleural effusion, airspace consolidation, or pneumothorax. No suspicious pulmonary nodule or mass identified. Dependent changes are noted within the posterior lung bases. Upper Abdomen: Multifocal liver metastases are again identified involving both lobes of liver. No acute abnormality noted within the imaged portions of the upper abdomen. Musculoskeletal: Status post left mastectomy with implant reconstruction. There is mild skin thickening with subcutaneous soft tissue stranding within the periphery  of the reconstructed left breast, image 90/3. IMPRESSION: 1. No evidence for primary lung neoplasm or metastatic disease to the chest. 2. Status post left mastectomy with implant reconstruction. There is mild skin thickening with subcutaneous soft tissue stranding within the periphery of the reconstructed left breast. This is nonspecific and may be related to postsurgical change. 3. Multifocal liver metastases. 4. Coronary artery calcifications. 5. Aortic  Atherosclerosis (ICD10-I70.0). Electronically Signed   By: Kerby Moors M.D.   On: 04/01/2021 11:42   CT ABDOMEN PELVIS W CONTRAST  Result Date: 03/31/2021 CLINICAL DATA:  Abdominal pain. EXAM: CT ABDOMEN AND PELVIS WITH CONTRAST TECHNIQUE: Multidetector CT imaging of the abdomen and pelvis was performed using the standard protocol following bolus administration of intravenous contrast. RADIATION DOSE REDUCTION: This exam was performed according to the departmental dose-optimization program which includes automated exposure control, adjustment of the mA and/or kV according to patient size and/or use of iterative reconstruction technique. CONTRAST:  62m OMNIPAQUE IOHEXOL 300 MG/ML  SOLN COMPARISON:  03/02/2020 FINDINGS: Lower chest: Visualized lung bases are clear. Hepatobiliary: Extensive ill-defined low-attenuation lesions are identified throughout both lobes of compatible with metastatic disease. This is a new finding when compared with 03/02/2020. Index lesion within right lobe of liver measures 2.8 by 2.1 cm, image 16/3. Index lesion within segment 4 measures 2.4 x 2.0 cm, image 27/3. Index lesion within segment 2/3 measures 2.4 x 2.3 cm, image 25/3. Status post cholecystectomy. No bile duct dilatation. Pancreas: Unremarkable. No pancreatic ductal dilatation or surrounding inflammatory changes. Spleen: Normal in size without focal abnormality. Adrenals/Urinary Tract: Normal adrenal glands. No kidney mass or hydronephrosis identified. No nephrolithiasis. Urinary bladder is unremarkable. Stomach/Bowel: Stomach appears within normal limits. No small bowel wall thickening, inflammation, or distension. No pathologic dilatation of the colon. No obstructing colonic mass identified. Vascular/Lymphatic: Aortic atherosclerosis. No aneurysm. No abdominopelvic adenopathy. Reproductive: Small exophytic calcified fibroid arises off the anterior uterus. Uterus is otherwise unremarkable. No adnexal mass. Other: No free  fluid or fluid collections. No aggressive lytic or sclerotic bone lesions. Musculoskeletal: No acute or significant osseous findings. IMPRESSION: 1. No acute findings identified within the abdomen or pelvis. 2. Extensive ill-defined low-attenuation lesions throughout both lobes of liver compatible with metastatic disease. This is a new finding when compared with previous CT of the abdomen pelvis from 03/02/2020. No primary neoplasm identified within the abdomen or pelvis. 3. Aortic Atherosclerosis (ICD10-I70.0). Electronically Signed   By: TKerby MoorsM.D.   On: 03/31/2021 14:39   MR 3D Recon At Scanner  Result Date: 04/20/2021 CLINICAL DATA:  Jaundice.  Fever.  Metastatic breast carcinoma. EXAM: MRI ABDOMEN WITHOUT AND WITH CONTRAST (INCLUDING MRCP) TECHNIQUE: Multiplanar multisequence MR imaging of the abdomen was performed both before and after the administration of intravenous contrast. Heavily T2-weighted images of the biliary and pancreatic ducts were obtained, and three-dimensional MRCP images were rendered by post processing. CONTRAST:  7.562mGADAVIST GADOBUTROL 1 MMOL/ML IV SOLN COMPARISON:  CT on 03/31/2021 FINDINGS: Lower chest: Tiny bilateral pleural effusions. Infiltrate or atelectasis in the posterior lung bases, right side greater than left. Hepatobiliary: Image degradation by motion artifact noted. Innumerable hypovascular masses are seen involving the liver diffusely, consistent with diffuse liver metastases. Largest index lesion located in segment 8 measures 5.1 x 4.2 cm on image 31/23. Comparison with prior CT is limited by differences in modality, without definite change when allowing for technical differences. Prior cholecystectomy. No evidence of biliary obstruction. Pancreas:  No mass or inflammatory changes. Spleen:  Within normal  limits in size and appearance. Adrenals/Urinary Tract: No masses identified. No evidence of hydronephrosis. Stomach/Bowel: Unremarkable. Vascular/Lymphatic:  No pathologically enlarged lymph nodes identified. No acute vascular findings. Other: Mild ascites and diffuse mesenteric and body wall edema noted. Musculoskeletal:  No suspicious bone lesions identified. IMPRESSION: Diffuse liver metastases, similar to recent CT. New mild ascites and anasarca. Tiny bilateral pleural effusions, with atelectasis or infiltrate in both lung bases. Prior cholecystectomy.  No evidence of biliary ductal dilatation. Electronically Signed   By: Marlaine Hind M.D.   On: 04/20/2021 14:42   US BIOPSY (LIVER)  Result Date: 04/09/2021 INDICATION: Liver lesions, concern for metastatic breast cancer EXAM: Ultrasound-guided core needle biopsy of liver lesion MEDICATIONS: None. ANESTHESIA/SEDATION: Moderate (conscious) sedation was employed during this procedure. A total of Versed 2 mg and Fentanyl 100 mcg was administered intravenously. Moderate Sedation Time: 12 minutes. The patient's level of consciousness and vital signs were monitored continuously by radiology nursing throughout the procedure under my direct supervision. FLUOROSCOPY TIME:  N/a COMPLICATIONS: None immediate. PROCEDURE: Informed written consent was obtained from the patient after a thorough discussion of the procedural risks, benefits and alternatives. All questions were addressed. Maximal Sterile Barrier Technique was utilized including caps, mask, sterile gowns, sterile gloves, sterile drape, hand hygiene and skin antiseptic. A timeout was performed prior to the initiation of the procedure. The patient was placed supine on the exam table. Limited ultrasound of the liver was performed, demonstrating numerous lesions within the right and left hepatic lobes. It appropriate lesion was identified in the left hepatic lobe amenable for percutaneous biopsy. Skin entry site was marked, the overlying skin was prepped and draped in the standard sterile fashion. Local analgesia was obtained with 1% lidocaine. Using ultrasound guidance,  a 17 gauge introducer needle was advanced towards the target lesion in the left hepatic lobe. Subsequently, core needle biopsy was performed using a 18 gauge core biopsy device x3 passes. Adequate tissue samples were obtained, and submitted in formalin to pathology for further handling. Limited postprocedure imaging demonstrated no hematoma or complicating feature. A clean dressing was placed after manual hemostasis. The patient tolerated the procedure well without immediate complication. IMPRESSION: Successful ultrasound-guided core needle biopsy of focal lesion in the left hepatic lobe. Electronically Signed   By: Albin Felling M.D.   On: 04/09/2021 11:07   DG Chest Port 1 View  Result Date: 04/20/2021 CLINICAL DATA:  Fever. EXAM: PORTABLE CHEST 1 VIEW COMPARISON:  March 31, 2021. FINDINGS: The heart size and mediastinal contours are within normal limits. Hypoinflation of the lungs is noted with minimal bibasilar subsegmental atelectasis. The visualized skeletal structures are unremarkable. IMPRESSION: Hypoinflation of the lungs with minimal bibasilar subsegmental atelectasis. Electronically Signed   By: Marijo Conception M.D.   On: 04/20/2021 08:49   MR ABDOMEN MRCP W WO CONTAST  Result Date: 04/20/2021 CLINICAL DATA:  Jaundice.  Fever.  Metastatic breast carcinoma. EXAM: MRI ABDOMEN WITHOUT AND WITH CONTRAST (INCLUDING MRCP) TECHNIQUE: Multiplanar multisequence MR imaging of the abdomen was performed both before and after the administration of intravenous contrast. Heavily T2-weighted images of the biliary and pancreatic ducts were obtained, and three-dimensional MRCP images were rendered by post processing. CONTRAST:  7.82m GADAVIST GADOBUTROL 1 MMOL/ML IV SOLN COMPARISON:  CT on 03/31/2021 FINDINGS: Lower chest: Tiny bilateral pleural effusions. Infiltrate or atelectasis in the posterior lung bases, right side greater than left. Hepatobiliary: Image degradation by motion artifact noted. Innumerable  hypovascular masses are seen involving the liver diffusely, consistent with diffuse  liver metastases. Largest index lesion located in segment 8 measures 5.1 x 4.2 cm on image 31/23. Comparison with prior CT is limited by differences in modality, without definite change when allowing for technical differences. Prior cholecystectomy. No evidence of biliary obstruction. Pancreas:  No mass or inflammatory changes. Spleen:  Within normal limits in size and appearance. Adrenals/Urinary Tract: No masses identified. No evidence of hydronephrosis. Stomach/Bowel: Unremarkable. Vascular/Lymphatic: No pathologically enlarged lymph nodes identified. No acute vascular findings. Other: Mild ascites and diffuse mesenteric and body wall edema noted. Musculoskeletal:  No suspicious bone lesions identified. IMPRESSION: Diffuse liver metastases, similar to recent CT. New mild ascites and anasarca. Tiny bilateral pleural effusions, with atelectasis or infiltrate in both lung bases. Prior cholecystectomy.  No evidence of biliary ductal dilatation. Electronically Signed   By: Marlaine Hind M.D.   On: 04/20/2021 14:42   US Abdomen Limited RUQ (LIVER/GB)  Result Date: 04/17/2021 CLINICAL DATA:  Hyperbilirubinemia. EXAM: ULTRASOUND ABDOMEN LIMITED RIGHT UPPER QUADRANT COMPARISON:  March 31, 2021. FINDINGS: Gallbladder: Status post cholecystectomy. Common bile duct: Diameter: 8 mm which is within normal limits for post cholecystectomy status. Liver: Multiple hypoechoic abnormalities are noted throughout hepatic parenchyma consistent with metastatic disease. Portal vein is patent on color Doppler imaging with normal direction of blood flow towards the liver. Other: None. IMPRESSION: Status post cholecystectomy. Diffuse hepatic metastatic disease is noted. Electronically Signed   By: Marijo Conception M.D.   On: 04/17/2021 09:36    Assessment and plan-   #Sepsis, due to UTI. Urine cultures positive for Staph aureus Blood cultures  negative. Continue antibiotics, currently on aztreonam.   #Metastatic breast cancer with extensive liver involvement, hyperbilirubinemia, coagulopathy Status post fulvestrant 500 mg x 1 today. I had a lengthy discussion with patient.  I also called patient's husband during the conversation. Discussed with patient that she is very sick and chemotherapy cannot be given to her and to she finishes her course of antibiotics and recovers from acute infection.  Endocrine therapy fulvestrant has been given.  Fulvestrant may not be effective enough to improve her condition. Hospice/comfort care is an option for now if she prefers quality of life.  Another option is to wait for few days and see how she does.  If her liver function is able to be stabilized, and she recovers from UTI, she can follow-up outpatient with me for further discussion and possible chemotherapy.  Gemcitabine potentially can be attempted in patient with impaired liver function if she is otherwise stable.  We also discussed that both chemotherapy/endocrine therapy may not be able work fast enough to prolong her life.  Sometimes chemotherapy side effects may shorten her life. Patient fully understands that she is very sick and if her liver function continues to deteriorate, she may only have days to weeks to live. Both patient and husband would like to see how she does in the next few days and then make a final decision.  Updated primary team and palliative care service.  #Abdomen distention due to ascites.  Generalized anasarca due to impaired liver function.  I will start her on spironolactone 50 mg daily and Lasix 20 mg daily.  Check ammonia level.  Consider lactulose   Thank you for allowing me to participate in the care of this patient.   Tammy Server, MD, PhD Hematology Oncology  04/23/2021

## 2021-04-24 DIAGNOSIS — N179 Acute kidney failure, unspecified: Secondary | ICD-10-CM

## 2021-04-24 DIAGNOSIS — K7682 Hepatic encephalopathy: Secondary | ICD-10-CM

## 2021-04-24 DIAGNOSIS — C787 Secondary malignant neoplasm of liver and intrahepatic bile duct: Secondary | ICD-10-CM | POA: Diagnosis not present

## 2021-04-24 DIAGNOSIS — R6510 Systemic inflammatory response syndrome (SIRS) of non-infectious origin without acute organ dysfunction: Secondary | ICD-10-CM | POA: Diagnosis not present

## 2021-04-24 DIAGNOSIS — C50919 Malignant neoplasm of unspecified site of unspecified female breast: Secondary | ICD-10-CM | POA: Diagnosis not present

## 2021-04-24 DIAGNOSIS — R17 Unspecified jaundice: Secondary | ICD-10-CM | POA: Diagnosis not present

## 2021-04-24 LAB — COMPREHENSIVE METABOLIC PANEL
ALT: 63 U/L — ABNORMAL HIGH (ref 0–44)
AST: 156 U/L — ABNORMAL HIGH (ref 15–41)
Albumin: 1.6 g/dL — ABNORMAL LOW (ref 3.5–5.0)
Alkaline Phosphatase: 357 U/L — ABNORMAL HIGH (ref 38–126)
Anion gap: 9 (ref 5–15)
BUN: 39 mg/dL — ABNORMAL HIGH (ref 8–23)
CO2: 17 mmol/L — ABNORMAL LOW (ref 22–32)
Calcium: 8 mg/dL — ABNORMAL LOW (ref 8.9–10.3)
Chloride: 105 mmol/L (ref 98–111)
Creatinine, Ser: 1.33 mg/dL — ABNORMAL HIGH (ref 0.44–1.00)
GFR, Estimated: 44 mL/min — ABNORMAL LOW (ref >60)
Glucose, Bld: 58 mg/dL — ABNORMAL LOW (ref 70–99)
Potassium: 3.6 mmol/L (ref 3.5–5.1)
Sodium: 131 mmol/L — ABNORMAL LOW (ref 135–145)
Total Bilirubin: 10.6 mg/dL — ABNORMAL HIGH (ref 0.3–1.2)
Total Protein: 5.7 g/dL — ABNORMAL LOW (ref 6.5–8.1)

## 2021-04-24 LAB — GLUCOSE, CAPILLARY
Glucose-Capillary: 71 mg/dL (ref 70–99)
Glucose-Capillary: 225 mg/dL — ABNORMAL HIGH (ref 70–99)
Glucose-Capillary: 204 mg/dL — ABNORMAL HIGH (ref 70–99)
Glucose-Capillary: 179 mg/dL — ABNORMAL HIGH (ref 70–99)

## 2021-04-24 LAB — AMMONIA: Ammonia: 54 umol/L — ABNORMAL HIGH (ref 9–35)

## 2021-04-24 MED ORDER — LACTULOSE 10 GM/15ML PO SOLN
20.0000 g | Freq: Two times a day (BID) | ORAL | Status: DC
  Administered 2021-04-24 – 2021-04-25 (×3): 20 g via ORAL
  Filled 2021-04-24 (×5): qty 30

## 2021-04-24 NOTE — Progress Notes (Signed)
Hematology/Oncology Progress note [phone] [phone]     Patient Care Team: Baxter Hire, MD as PCP - General (Internal Medicine) Leonie Man, MD as PCP - Cardiology (Cardiology) Earlie Server, MD as Consulting Physician (Oncology) Herbert Pun, MD as Consulting Physician (General Surgery) Dillingham, Loel Lofty, DO as Attending Physician (Plastic Surgery) Rico Junker, RN as Registered Nurse Theodore Demark, RN as Registered Nurse   Name of the patient: Tammy Elliott  [id number]  [birth year]  Date of visit: 04/24/21   INTERVAL HISTORY-  Lower extremity swelling has improved. Appetite is fair. Husband is at bedside. No nausea vomiting.  Allergies  Allergen Reactions   Penicillins Hives and Swelling   Exenatide Nausea Only   Orlistat Diarrhea   Thiazide-Type Diuretics     Unknown reaction    Patient Active Problem List   Diagnosis Date Noted   Palliative care encounter    Other ascites    Coagulopathy ([place])    Hyperbilirubinemia    SIRS (systemic inflammatory response syndrome) (HCC) 04/20/2021   Abnormal LFTs 04/20/2021   Hyponatremia 04/20/2021   AKI (acute kidney injury) ([place]) 04/20/2021   Jaundice    Goals of care, counseling/discussion 04/16/2021   Metastatic breast cancer ([place]) 04/16/2021   Transaminitis    Liver metastasis ([place]) 03/31/2021   Family history of uterine cancer 11/01/2020   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   Basal cell carcinoma 03/28/2020   Diabetes mellitus type 2 with complications ([place]) 57/03/7791   Pelvic pain in female 03/28/2020   Abdominal pain 03/28/2020   S/P mastectomy, left 05/11/2019   Malignant neoplasm of upper-outer quadrant of left breast in female, estrogen receptor positive ([place]) 03/23/2019   CAD S/P percutaneous coronary angioplasty 05/04/2018   GERD (gastroesophageal reflux disease) 08/22/2014   CAD (coronary artery disease) 10/20/2013   Hyperlipemia 10/20/2013      Past Medical History:  Diagnosis Date   Aortic atherosclerosis (HCC)    CAD (coronary artery disease) 04/12/2010   a.) PCI --> 85% pLAD --> 2.5 x 15 mm Xience V DES x 1   Chronic anticoagulation    Rivaroxaban   Chronic ischemic heart disease    DOE (dyspnea on exertion)    Family history of uterine cancer    GERD (gastroesophageal reflux disease)    Hypercholesterolemia    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   a.) Stage IA (cT1b, cN0, cM0, G2, ER+, PR+, HER2-); b.) adjuvant endocrine therapy started 06/2019; c.) s/p total mastectomy, SNLB, and breast reconstruction in 04/2019   Liver cancer ([place])    Skin cancer, basal cell    s/p excision   Superior mesenteric artery stenosis (HCC)    T2DM (type 2 diabetes mellitus) ([place])      Past Surgical History:  Procedure Laterality Date   BREAST BIOPSY Left 03/11/2019   Stereotactic CNB; pathology --> ER/PR (-), HER2/neu (-) IMC iwth lobular features   BREAST RECONSTRUCTION WITH PLACEMENT OF TISSUE EXPANDER AND FLEX HD (ACELLULAR HYDRATED DERMIS) Left 05/03/2019   Procedure: LEFT BREAST RECONSTRUCTION WITH PLACEMENT OF TISSUE EXPANDER AND FLEX HD (ACELLULAR HYDRATED DERMIS);  Surgeon: Wallace Going, DO;  Location: [facility];  Service: Plastics;  Laterality: Left;   CHOLECYSTECTOMY     COLONOSCOPY     CORONARY ANGIOPLASTY WITH STENT PLACEMENT Left 04/12/2010   Procedure: LHC with placement of 2.5 x 15 mm Xience V DES x 1 to pLAD; Location: [facility]; Surgeon: Isaias Cowman, MD  EXCISION OF BREAST BIOPSY Left 11/17/2020   Procedure: EXCISION OF BREAST BIOPSY;  Surgeon: Herbert Pun, MD;  Location: [facility];  Service: General;  Laterality: Left;   LIPOSUCTION WITH LIPOFILLING Bilateral 01/27/2020   Procedure: LIPOSUCTION WITH LIPOFILLING;  Surgeon: Wallace Going, DO;  Location: [facility];  Service: Plastics;  Laterality: Bilateral;  90 min   MASTOPEXY Right  07/01/2019   Procedure: MASTOPEXY;  Surgeon: Wallace Going, DO;  Location: [facility];  Service: Plastics;  Laterality: Right;   REMOVAL OF TISSUE EXPANDER AND PLACEMENT OF IMPLANT Left 07/01/2019   Procedure: REMOVAL OF TISSUE EXPANDER AND PLACEMENT OF IMPLANT;  Surgeon: Wallace Going, DO;  Location: [facility];  Service: Plastics;  Laterality: Left;  total case 2.5 hours   TOTAL MASTECTOMY Left 05/03/2019   Procedure: TOTAL MASTECTOMY W/ Sentinel Node;  Surgeon: Herbert Pun, MD;  Location: [facility];  Service: General;  Laterality: Left;   VISCERAL ANGIOGRAPHY N/A 05/01/2020   Procedure: VISCERAL ANGIOGRAPHY;  Surgeon: Algernon Huxley, MD;  Location: [facility];  Service: Cardiovascular;  Laterality: N/A;    Social History   Socioeconomic History   Marital status: Married    Spouse name: Not on file   Number of children: Not on file   Years of education: Not on file   Highest education level: Not on file  Occupational History   Occupation: Analyst    Employer: [employer]  Tobacco Use   Smoking status: Never   Smokeless tobacco: Never  Vaping Use   Vaping Use: Never used  Substance and Sexual Activity   Alcohol use: Not Currently    Comment: rarely   Drug use: Never   Sexual activity: Not on file  Other Topics Concern   Not on file  Social History Narrative   reports that she has never smoked. She has never used smokeless tobacco. She reports that she rarely drinks alcohol. She reports that she does not use drugs.      Married to [name]    Social Determinants of Health   Financial Resource Strain: Not on file  Food Insecurity: Not on file  Transportation Needs: Not on file  Physical Activity: Not on file  Stress: Not on file  Social Connections: Not on file  Intimate Partner Violence: Not on file     Family History  Problem Relation Age of Onset   CAD Mother    Diabetes Mellitus II Mother     Hypertension Mother    Uterine cancer Mother        dx late 87s   CAD Father    Heart attack Father    Hypertension Father    Diabetes Mellitus II Father        Took insulin   Alcohol abuse Father    Diabetes Mellitus II Sister    Cancer Sister        possible cervical or uterine   Lung cancer Paternal Aunt    Cancer Paternal Uncle        unk type   Diabetes Other        Almost Elliott sisters are diabetic or pre-diabetic    Cancer Cousin        possible colon/stomach/something in abdomen   Breast cancer Neg Hx      Current Facility-Administered Medications:    calcium-vitamin D (OSCAL WITH D) 500-5 MG-MCG per tablet, , Oral, QPM, Ivor Costa, MD, 1 tablet at 04/23/21  1704   Chlorhexidine Gluconate Cloth 2 % PADS 6 each, 6 each, Topical, Daily, Priscella Mann, Sudheer B, MD, 6 each at 04/24/21 1015   cholecalciferol (VITAMIN D3) tablet 1,000 Units, 1,000 Units, Oral, Daily, Ivor Costa, MD, 1,000 Units at 04/24/21 1015   clopidogrel (PLAVIX) tablet 75 mg, 75 mg, Oral, Daily, Ivor Costa, MD, 75 mg at 04/24/21 1015   feeding supplement (ENSURE ENLIVE / ENSURE PLUS) liquid 237 mL, 237 mL, Oral, TID BM, Sreenath, Sudheer B, MD   furosemide (LASIX) tablet 20 mg, 20 mg, Oral, Daily, Earlie Server, MD, 20 mg at 04/24/21 1015   heparin injection 5,000 Units, 5,000 Units, Subcutaneous, Q8H, Ivor Costa, MD, 5,000 Units at 04/24/21 0535   HYDROmorphone (DILAUDID) injection 0.5 mg, 0.5 mg, Intravenous, Q3H PRN, Sreenath, Sudheer B, MD   insulin aspart (novoLOG) injection 0-15 Units, 0-15 Units, Subcutaneous, TID WC, Sreenath, Sudheer B, MD, 3 Units at 04/23/21 1704   insulin aspart (novoLOG) injection 0-5 Units, 0-5 Units, Subcutaneous, QHS, Niu, Soledad Gerlach, MD, 3 Units at 04/20/21 2349   lactulose (CHRONULAC) 10 GM/15ML solution 20 g, 20 g, Oral, BID, Earlie Server, MD   morphine CONCENTRATE 10 MG/0.5ML oral solution 10 mg, 10 mg, Oral, Q6H PRN, Ivor Costa, MD, 10 mg at 04/24/21 1014   multivitamin with minerals  tablet 1 tablet, 1 tablet, Oral, Daily, Ivor Costa, MD, 1 tablet at 04/24/21 1015   mupirocin ointment (BACTROBAN) 2 % 1 application, 1 application, Nasal, BID, Priscella Mann, Sudheer B, MD, 1 application at 16/60/63 1015   ondansetron (ZOFRAN) injection 4 mg, 4 mg, Intravenous, Q8H PRN, Ivor Costa, MD, 4 mg at 04/22/21 2018   pantoprazole (PROTONIX) EC tablet 20 mg, 20 mg, Oral, Daily, Ivor Costa, MD, 20 mg at 04/24/21 1015   pneumococcal 23 valent vaccine (PNEUMOVAX-23) injection 0.5 mL, 0.5 mL, Intramuscular, Prior to discharge, Ivor Costa, MD   spironolactone (ALDACTONE) tablet 50 mg, 50 mg, Oral, Daily, Earlie Server, MD, 50 mg at 04/24/21 1015   Physical exam:  Vitals:   04/23/21 1653 04/23/21 1954 04/24/21 0525 04/24/21 0750  BP: (!) 152/78 (!) 147/82 (!) 117/49 (!) 112/56  Pulse: 80 92 73 75  Resp: 16   16  Temp: 98.3 F (36.8 C) 98.2 F (36.8 C) 98 F (36.7 C) (!) 97.3 F (36.3 C)  TempSrc:  Oral  Oral  SpO2: 99% 99% 96% 96%  Weight:      Height:       Physical Exam Constitutional:      General: She is not in acute distress.    Appearance: She is not diaphoretic.  HENT:     Head: Normocephalic and atraumatic.     Mouth/Throat:     Pharynx: No oropharyngeal exudate.  Eyes:     General: No scleral icterus. Cardiovascular:     Rate and Rhythm: Normal rate.     Heart sounds: No murmur heard. Pulmonary:     Effort: Pulmonary effort is normal. No respiratory distress.  Abdominal:     General: There is distension.     Palpations: Abdomen is soft.     Tenderness: There is no abdominal tenderness.  Musculoskeletal:        General: Swelling present. Normal range of motion.     Cervical back: Normal range of motion and neck supple.  Skin:    General: Skin is warm and dry.     Coloration: Skin is jaundiced.  Neurological:     Mental Status: She is alert and oriented to  person, place, and time.     Cranial Nerves: No cranial nerve deficit.     Motor: No abnormal muscle tone.   Psychiatric:        Mood and Affect: Affect normal.       CMP Latest Ref Rng & Units 04/24/2021  Glucose 70 - 99 mg/dL 58(L)  BUN 8 - 23 mg/dL 39(H)  Creatinine 0.44 - 1.00 mg/dL 1.33(H)  Sodium 135 - 145 mmol/L 131(L)  Potassium 3.5 - 5.1 mmol/L 3.6  Chloride 98 - 111 mmol/L 105  CO2 22 - 32 mmol/L 17(L)  Calcium 8.9 - 10.3 mg/dL 8.0(L)  Total Protein 6.5 - 8.1 g/dL 5.7(L)  Total Bilirubin 0.3 - 1.2 mg/dL 10.6(H)  Alkaline Phos 38 - 126 U/L 357(H)  AST 15 - 41 U/L 156(H)  ALT 0 - 44 U/L 63(H)   CBC Latest Ref Rng & Units 04/21/2021  WBC 4.0 - 10.5 K/uL 10.4  Hemoglobin 12.0 - 15.0 g/dL 9.7(L)  Hematocrit 36.0 - 46.0 % 30.2(L)  Platelets 150 - 400 K/uL 270    RADIOGRAPHIC STUDIES: I have personally reviewed the radiological images as listed and agreed with the findings in the report. DG Chest 2 View  Result Date: 03/31/2021 CLINICAL DATA:  Shortness of breath. EXAM: CHEST - 2 VIEW COMPARISON:  None. FINDINGS: Heart size and mediastinal contours are normal. No pleural effusion or edema identified. No airspace opacities. Visualized osseous structures are unremarkable. IMPRESSION: No active cardiopulmonary abnormalities. Electronically Signed   By: Kerby Moors M.D.   On: 03/31/2021 14:04   CT CHEST W CONTRAST  Result Date: 04/01/2021 CLINICAL DATA:  Evaluate for malignancy. Newly discovered liver metastases. EXAM: CT CHEST WITH CONTRAST TECHNIQUE: Multidetector CT imaging of the chest was performed during intravenous contrast administration. RADIATION DOSE REDUCTION: This exam was performed according to the departmental dose-optimization program which includes automated exposure control, adjustment of the mA and/or kV according to patient size and/or use of iterative reconstruction technique. CONTRAST:  159m OMNIPAQUE IOHEXOL 350 MG/ML SOLN COMPARISON:  CT AP 03/31/2021. FINDINGS: Cardiovascular: No significant vascular findings. Normal heart size. No pericardial effusion. Aortic  atherosclerosis and coronary artery calcifications. Mediastinum/Nodes: No enlarged mediastinal, hilar, or axillary lymph nodes. Thyroid gland, trachea, and esophagus demonstrate no significant findings. Lungs/Pleura: Scar like density identified within the superior segment of right lower lobe. No pleural effusion, airspace consolidation, or pneumothorax. No suspicious pulmonary nodule or mass identified. Dependent changes are noted within the posterior lung bases. Upper Abdomen: Multifocal liver metastases are again identified involving both lobes of liver. No acute abnormality noted within the imaged portions of the upper abdomen. Musculoskeletal: Status post left mastectomy with implant reconstruction. There is mild skin thickening with subcutaneous soft tissue stranding within the periphery of the reconstructed left breast, image 90/3. IMPRESSION: 1. No evidence for primary lung neoplasm or metastatic disease to the chest. 2. Status post left mastectomy with implant reconstruction. There is mild skin thickening with subcutaneous soft tissue stranding within the periphery of the reconstructed left breast. This is nonspecific and may be related to postsurgical change. 3. Multifocal liver metastases. 4. Coronary artery calcifications. 5. Aortic Atherosclerosis (ICD10-I70.0). Electronically Signed   By: TKerby MoorsM.D.   On: 04/01/2021 11:42   CT ABDOMEN PELVIS W CONTRAST  Result Date: 03/31/2021 CLINICAL DATA:  Abdominal pain. EXAM: CT ABDOMEN AND PELVIS WITH CONTRAST TECHNIQUE: Multidetector CT imaging of the abdomen and pelvis was performed using the standard protocol following bolus administration of intravenous contrast. RADIATION DOSE  REDUCTION: This exam was performed according to the departmental dose-optimization program which includes automated exposure control, adjustment of the mA and/or kV according to patient size and/or use of iterative reconstruction technique. CONTRAST:  48m OMNIPAQUE  IOHEXOL 300 MG/ML  SOLN COMPARISON:  03/02/2020 FINDINGS: Lower chest: Visualized lung bases are clear. Hepatobiliary: Extensive ill-defined low-attenuation lesions are identified throughout both lobes of compatible with metastatic disease. This is a new finding when compared with 03/02/2020. Index lesion within right lobe of liver measures 2.8 by 2.1 cm, image 16/3. Index lesion within segment 4 measures 2.4 x 2.0 cm, image 27/3. Index lesion within segment 2/3 measures 2.4 x 2.3 cm, image 25/3. Status post cholecystectomy. No bile duct dilatation. Pancreas: Unremarkable. No pancreatic ductal dilatation or surrounding inflammatory changes. Spleen: Normal in size without focal abnormality. Adrenals/Urinary Tract: Normal adrenal glands. No kidney mass or hydronephrosis identified. No nephrolithiasis. Urinary bladder is unremarkable. Stomach/Bowel: Stomach appears within normal limits. No small bowel wall thickening, inflammation, or distension. No pathologic dilatation of the colon. No obstructing colonic mass identified. Vascular/Lymphatic: Aortic atherosclerosis. No aneurysm. No abdominopelvic adenopathy. Reproductive: Small exophytic calcified fibroid arises off the anterior uterus. Uterus is otherwise unremarkable. No adnexal mass. Other: No free fluid or fluid collections. No aggressive lytic or sclerotic bone lesions. Musculoskeletal: No acute or significant osseous findings. IMPRESSION: 1. No acute findings identified within the abdomen or pelvis. 2. Extensive ill-defined low-attenuation lesions throughout both lobes of liver compatible with metastatic disease. This is a new finding when compared with previous CT of the abdomen pelvis from 03/02/2020. No primary neoplasm identified within the abdomen or pelvis. 3. Aortic Atherosclerosis (ICD10-I70.0). Electronically Signed   By: TKerby MoorsM.D.   On: 03/31/2021 14:39   MR 3D Recon At Scanner  Result Date: 04/20/2021 CLINICAL DATA:  Jaundice.  Fever.   Metastatic breast carcinoma. EXAM: MRI ABDOMEN WITHOUT AND WITH CONTRAST (INCLUDING MRCP) TECHNIQUE: Multiplanar multisequence MR imaging of the abdomen was performed both before and after the administration of intravenous contrast. Heavily T2-weighted images of the biliary and pancreatic ducts were obtained, and three-dimensional MRCP images were rendered by post processing. CONTRAST:  7.561mGADAVIST GADOBUTROL 1 MMOL/ML IV SOLN COMPARISON:  CT on 03/31/2021 FINDINGS: Lower chest: Tiny bilateral pleural effusions. Infiltrate or atelectasis in the posterior lung bases, right side greater than left. Hepatobiliary: Image degradation by motion artifact noted. Innumerable hypovascular masses are seen involving the liver diffusely, consistent with diffuse liver metastases. Largest index lesion located in segment 8 measures 5.1 x 4.2 cm on image 31/23. Comparison with prior CT is limited by differences in modality, without definite change when allowing for technical differences. Prior cholecystectomy. No evidence of biliary obstruction. Pancreas:  No mass or inflammatory changes. Spleen:  Within normal limits in size and appearance. Adrenals/Urinary Tract: No masses identified. No evidence of hydronephrosis. Stomach/Bowel: Unremarkable. Vascular/Lymphatic: No pathologically enlarged lymph nodes identified. No acute vascular findings. Other: Mild ascites and diffuse mesenteric and body wall edema noted. Musculoskeletal:  No suspicious bone lesions identified. IMPRESSION: Diffuse liver metastases, similar to recent CT. New mild ascites and anasarca. Tiny bilateral pleural effusions, with atelectasis or infiltrate in both lung bases. Prior cholecystectomy.  No evidence of biliary ductal dilatation. Electronically Signed   By: JoMarlaine Hind.D.   On: 04/20/2021 14:42   US[place]oreaIOPSY (LIVER)  Result Date: 04/09/2021 INDICATION: Liver lesions, concern for metastatic breast cancer EXAM: Ultrasound-guided core needle biopsy of  liver lesion MEDICATIONS: None. ANESTHESIA/SEDATION: Moderate (conscious) sedation was employed during this  procedure. A total of Versed 2 mg and Fentanyl 100 mcg was administered intravenously. Moderate Sedation Time: 12 minutes. The patient's level of consciousness and vital signs were monitored continuously by radiology nursing throughout the procedure under my direct supervision. FLUOROSCOPY TIME:  N/a COMPLICATIONS: None immediate. PROCEDURE: Informed written consent was obtained from the patient after a thorough discussion of the procedural risks, benefits and alternatives. Elliott questions were addressed. Maximal Sterile Barrier Technique was utilized including caps, mask, sterile gowns, sterile gloves, sterile drape, hand hygiene and skin antiseptic. A timeout was performed prior to the initiation of the procedure. The patient was placed supine on the exam table. Limited ultrasound of the liver was performed, demonstrating numerous lesions within the right and left hepatic lobes. It appropriate lesion was identified in the left hepatic lobe amenable for percutaneous biopsy. Skin entry site was marked, the overlying skin was prepped and draped in the standard sterile fashion. Local analgesia was obtained with 1% lidocaine. Using ultrasound guidance, a 17 gauge introducer needle was advanced towards the target lesion in the left hepatic lobe. Subsequently, core needle biopsy was performed using a 18 gauge core biopsy device x3 passes. Adequate tissue samples were obtained, and submitted in formalin to pathology for further handling. Limited postprocedure imaging demonstrated no hematoma or complicating feature. A clean dressing was placed after manual hemostasis. The patient tolerated the procedure well without immediate complication. IMPRESSION: Successful ultrasound-guided core needle biopsy of focal lesion in the left hepatic lobe. Electronically Signed   By: Albin Felling M.D.   On: 04/09/2021 11:07   DG  Chest Port 1 View  Result Date: 04/20/2021 CLINICAL DATA:  Fever. EXAM: PORTABLE CHEST 1 VIEW COMPARISON:  March 31, 2021. FINDINGS: The heart size and mediastinal contours are within normal limits. Hypoinflation of the lungs is noted with minimal bibasilar subsegmental atelectasis. The visualized skeletal structures are unremarkable. IMPRESSION: Hypoinflation of the lungs with minimal bibasilar subsegmental atelectasis. Electronically Signed   By: Marijo Conception M.D.   On: 04/20/2021 08:49   MR ABDOMEN MRCP W WO CONTAST  Result Date: 04/20/2021 CLINICAL DATA:  Jaundice.  Fever.  Metastatic breast carcinoma. EXAM: MRI ABDOMEN WITHOUT AND WITH CONTRAST (INCLUDING MRCP) TECHNIQUE: Multiplanar multisequence MR imaging of the abdomen was performed both before and after the administration of intravenous contrast. Heavily T2-weighted images of the biliary and pancreatic ducts were obtained, and three-dimensional MRCP images were rendered by post processing. CONTRAST:  7.36m GADAVIST GADOBUTROL 1 MMOL/ML IV SOLN COMPARISON:  CT on 03/31/2021 FINDINGS: Lower chest: Tiny bilateral pleural effusions. Infiltrate or atelectasis in the posterior lung bases, right side greater than left. Hepatobiliary: Image degradation by motion artifact noted. Innumerable hypovascular masses are seen involving the liver diffusely, consistent with diffuse liver metastases. Largest index lesion located in segment 8 measures 5.1 x 4.2 cm on image 31/23. Comparison with prior CT is limited by differences in modality, without definite change when allowing for technical differences. Prior cholecystectomy. No evidence of biliary obstruction. Pancreas:  No mass or inflammatory changes. Spleen:  Within normal limits in size and appearance. Adrenals/Urinary Tract: No masses identified. No evidence of hydronephrosis. Stomach/Bowel: Unremarkable. Vascular/Lymphatic: No pathologically enlarged lymph nodes identified. No acute vascular findings.  Other: Mild ascites and diffuse mesenteric and body wall edema noted. Musculoskeletal:  No suspicious bone lesions identified. IMPRESSION: Diffuse liver metastases, similar to recent CT. New mild ascites and anasarca. Tiny bilateral pleural effusions, with atelectasis or infiltrate in both lung bases. Prior cholecystectomy.  No evidence of  biliary ductal dilatation. Electronically Signed   By: Marlaine Hind M.D.   On: 04/20/2021 14:42   US Abdomen Limited RUQ (LIVER/GB)  Result Date: 04/17/2021 CLINICAL DATA:  Hyperbilirubinemia. EXAM: ULTRASOUND ABDOMEN LIMITED RIGHT UPPER QUADRANT COMPARISON:  March 31, 2021. FINDINGS: Gallbladder: Status post cholecystectomy. Common bile duct: Diameter: 8 mm which is within normal limits for post cholecystectomy status. Liver: Multiple hypoechoic abnormalities are noted throughout hepatic parenchyma consistent with metastatic disease. Portal vein is patent on color Doppler imaging with normal direction of blood flow towards the liver. Other: None. IMPRESSION: Status post cholecystectomy. Diffuse hepatic metastatic disease is noted. Electronically Signed   By: Marijo Conception M.D.   On: 04/17/2021 09:36    Assessment and plan-   #Sepsis, due to UTI. Urine cultures positive for Staph aureus, unclear significance.  Blood cultures negative. Finished 5 days of antibiotics.    #Metastatic breast cancer with extensive liver involvement, hyperbilirubinemia/impaired liver function, coagulopathy Status post fulvestrant 500 mg x 1 on 04/23/21 Bilirubin level 106, slightly better, continue to monitor.   #Abdomen distention due to ascites, generalized anasarca  Continue lasix 68m daily and Spirolactone 553mdaily.   # Hepatic encephalopathy, ammonia level is elevated at 50, start lactulose 2029mID.  # AKI, kidney function is elevated. Avoid nephrotoxin. Consult nephrology.   Thank you for allowing me to participate in the care of this patient.   ZhoEarlie ServerD,  PhD Hematology Oncology  04/24/2021

## 2021-04-24 NOTE — Progress Notes (Signed)
Chaplain visited with patient who shared openly how her faith makes the present moment "easier but not better". Chaplain met pt with empathy and encouragement. When her husband joined the conversation it was easy to see the couple has a supportive and caring relationship. Pt expressed the spiritual conversation shared helped her to feel relief. Continued support available per on call chaplain.

## 2021-04-24 NOTE — Progress Notes (Signed)
Patient with family, request chaplain return later.

## 2021-04-24 NOTE — Progress Notes (Signed)
Chaplain Maggie attempted to meeting with pt but her sisters arrived and wanted to spend a few minutes with her. Pt asked Chaplain to return 15 minutes. Chaplain expects to return in 15 minutes.

## 2021-04-24 NOTE — Progress Notes (Signed)
PROGRESS NOTE    Tammy Elliott  IWP:[id number] DOB: Mar 20, 1950 DOA: 04/20/2021 PCP: Baxter Hire, MD    Brief Narrative:   71 y.o. female with medical history significant of breast cancer metastasized to the liver, hyperlipidemia, diabetes mellitus, CAD, stent placement, SMA stenosis, who present with fever.   Patient states that she started having fever and chills yesterday.  She has generalized weakness and fatigue.  She has mild left sided abdominal pain, no nausea, vomiting or abdominal pain.  She had 1 episode of loose stool, currently no active diarrhea.  No chest pain, cough, shortness of breath.  No symptoms of UTI.  2/13: Engaged palliative care.  Case discussed at length with palliative care NP Altha Harm.  Recommendations appreciated.  At this point patient would like to speak with Dr. Tasia Catchings to discuss potential efficacy of immunotherapy  2/14: Oncology met with patient and discussed clinical status with patient and husband.  At this point patient and husband would like to continue current scope of treatment to see where it goes then have further discussions regarding goals of care.  Palliative care also engaged.   Assessment & Plan:   Principal Problem:   SIRS (systemic inflammatory response syndrome) (HCC) Active Problems:   CAD (coronary artery disease)   Diabetes mellitus type 2 with complications (HCC)   GERD (gastroesophageal reflux disease)   Hyperlipemia   Superior mesenteric artery stenosis (HCC)   Liver metastasis (HCC)   Metastatic breast cancer (HCC)   Abnormal LFTs   Hyponatremia   AKI (acute kidney injury) ([place])   Jaundice   Palliative care encounter   Other ascites   Coagulopathy ([place])   Hyperbilirubinemia  SIRS (systemic inflammatory response syndrome) ([place]) Possible UTI  Patient has SIRS with fever 100.7, heart rate 103, RR 22.   Lactic acid is elevated 3.6 --> 2.6.  No symptoms of pneumonia.   Possible urinary tract infection, though urine  may be contaminated with stool MRCP with diffuse metastasis however no biliary ductal obstruction or dilatation -MRSA screen negative -Unclear etiology of positive Staph aureus in urine Procalcitonin elevated though likely impacted by underlying malignancy Plan: Patient received 5 days of broad-spectrum antibiotics.  Discontinue at this time.  Continue to monitor vitals and fever curve.  Can restart antibiotics if patient develops any fever or signs of worsening infection.  Liver metastasis and Metastatic breast cancer [facility]) -consulted Dr. Tasia Catchings of oncology Patient would like to attempt immunotherapy.  Plan to initiate immunotherapy over the weekend and change regimen on Monday 2/13 -Oncology palliative care Dr. Regenia Skeeter has been engaged Plan: At this point patient and husband would like to see how she does in the next few days and further discuss treatment options and goals of care with oncology.  Guarded prognosis.  DNR status.  Abnormal LFTs Hyperbilirubinemia Due to metastasized to liver disease No evidence of biliary obstruction or dilatation on MRCP -Avoid using Tylenol -Hold statin  Anasarca Likely in the setting of multiorgan failure Lasix and Aldactone started per oncology on 2/13 Monitor kidney function   CAD (coronary artery disease) No chest pain, stable -Hold lovastatin due to abnormal liver function -Continue Plavix   Diabetes mellitus type 2 with complications (HCC)  Recent A1c 7.0.   Patient is taking Amaryl, metformin, Januvia and Jardiance at home Hypoglycemia noted on 2/14 Plan: DC Sem[place] Relax dietary restrictions, regular diet Continue sliding scale and ACH S Accu-Cheks  GERD (gastroesophageal reflux disease) -Protonix   Hyperlipemia -Hold atorvastatin due to abnormal  liver function   Superior mesenteric artery stenosis (HCC) -Plavix   Hyponatremia Sodium 129 on admission, likely due to decreased oral intake -DC IV fluids -BMP daily   AKI  (acute kidney injury) ([place]) -DC IV fluids  Metabolic acidosis Could be secondary to liver failure versus normal saline fluid resuscitation Plan: maintenance fluids lactated Ringer's at 75 cc/h   DVT prophylaxis: SQ heparin Code Status: DNR Family Communication: spouse [name] [phone] on 2/12 Disposition Plan: Status is: Inpatient Remains inpatient appropriate because: Breast cancer with diffuse hepatic metastasis.  Guarded prognosis.  Oncology and palliative care following   Level of care: Telemetry Medical  Consultants:  Oncology  Procedures:  None  Antimicrobials: Azactam Flagyl   Subjective: Seen and examined.  Appears.  Understands her prognosis is poor.  Objective: Vitals:   04/23/21 1653 04/23/21 1954 04/24/21 0525 04/24/21 0750  BP: (!) 152/78 (!) 147/82 (!) 117/49 (!) 112/56  Pulse: 80 92 73 75  Resp: 16   16  Temp: 98.3 F (36.8 C) 98.2 F (36.8 C) 98 F (36.7 C) (!) 97.3 F (36.3 C)  TempSrc:  Oral  Oral  SpO2: 99% 99% 96% 96%  Weight:      Height:        Intake/Output Summary (Last 24 hours) at 04/24/2021 1130 Last data filed at 04/24/2021 1041 Gross per 24 hour  Intake 811.67 ml  Output 250 ml  Net 561.67 ml   Filed Weights   04/20/21 0829  Weight: 77.1 kg    Examination:  General exam: No acute distress.  Appears depressed Respiratory system: Lungs clear.  No work breathing.  Room air Cardiovascular system: S1-S2, RRR, no murmurs, no pedal edema Gastrointestinal system: Soft, mildly distended, nontender Central nervous system: Alert and oriented. No focal neurological deficits. Extremities: Symmetric 5 x 5 power. Skin: Diffuse jaundice.  Scleral icterus Psychiatry: Judgement and insight appear normal. Mood & affect depressed.     Data Reviewed: I have personally reviewed following labs and imaging studies  CBC: Recent Labs  Lab 04/20/21 0829 04/21/21 0400  WBC 11.5* 10.4  NEUTROABS 8.9*  --   HGB 10.6* 9.7*  HCT 33.2*  30.2*  MCV 98.8 96.8  PLT 330 191   Basic Metabolic Panel: Recent Labs  Lab 04/21/21 0400 04/21/21 0523 04/22/21 0341 04/23/21 0526 04/24/21 0508  NA 127* 127* 128* 130* 131*  K 4.3 4.2 3.7 4.1 3.6  CL 100 99 103 100 105  CO2 18* 19* 16* 18* 17*  GLUCOSE 269* 259* 141* 204* 58*  BUN 36* 36* 39* 36* 39*  CREATININE 1.33* 1.40* 1.09* 1.13* 1.33*  CALCIUM 8.0* 7.9* 7.8* 8.2* 8.0*   GFR: Estimated Creatinine Clearance: 44.2 mL/min (A) (by C-G formula based on SCr of 1.33 mg/dL (H)). Liver Function Tests: Recent Labs  Lab 04/20/21 1106 04/21/21 0400 04/22/21 0341 04/23/21 0526 04/24/21 0508  AST 427* 291* 189* 168* 156*  ALT 132* 112* 88* 80* 63*  ALKPHOS 478* 398* 362* 361* 357*  BILITOT 10.8* 12.2* 11.5* 12.3* 10.6*  PROT 6.0* 6.0* 5.7* 6.1* 5.7*  ALBUMIN 2.0* 2.0* 1.7* 1.8* 1.6*   No results for input(s): LIPASE, AMYLASE in the last 168 hours. Recent Labs  Lab 04/24/21 0508  AMMONIA 54*   Coagulation Profile: Recent Labs  Lab 04/20/21 1106  INR 1.6*   Cardiac Enzymes: No results for input(s): CKTOTAL, CKMB, CKMBINDEX, TROPONINI in the last 168 hours. BNP (last 3 results) No results for input(s): PROBNP in the last 8760 hours. HbA1C:  No results for input(s): HGBA1C in the last 72 hours. CBG: Recent Labs  Lab 04/23/21 0725 04/23/21 1113 04/23/21 1655 04/23/21 2046 04/24/21 0748  GLUCAP 158* 178* 174* 106* 71   Lipid Profile: No results for input(s): CHOL, HDL, LDLCALC, TRIG, CHOLHDL, LDLDIRECT in the last 72 hours. Thyroid Function Tests: No results for input(s): TSH, T4TOTAL, FREET4, T3FREE, THYROIDAB in the last 72 hours. Anemia Panel: No results for input(s): VITAMINB12, FOLATE, FERRITIN, TIBC, IRON, RETICCTPCT in the last 72 hours. Sepsis Labs: Recent Labs  Lab 04/20/21 0829 04/20/21 1106 04/20/21 2003 04/22/21 0341  PROCALCITON  --  5.94  --  5.12  LATICACIDVEN 3.6* 2.6* 3.3*  --     Recent Results (from the past 240 hour(s))  Resp  Panel by RT-PCR (Flu A&B, Covid) Nasopharyngeal Swab     Status: None   Collection Time: 04/20/21  8:29 AM   Specimen: Nasopharyngeal Swab; Nasopharyngeal(NP) swabs in vial transport medium  Result Value Ref Range Status   SARS Coronavirus 2 by RT PCR NEGATIVE NEGATIVE Final    Comment: (NOTE) SARS-CoV-2 target nucleic acids are NOT DETECTED.  The SARS-CoV-2 RNA is generally detectable in upper respiratory specimens during the acute phase of infection. The lowest concentration of SARS-CoV-2 viral copies this assay can detect is 138 copies/mL. A negative result does not preclude SARS-Cov-2 infection and should not be used as the sole basis for treatment or other patient management decisions. A negative result may occur with  improper specimen collection/handling, submission of specimen other than nasopharyngeal swab, presence of viral mutation(s) within the areas targeted by this assay, and inadequate number of viral copies(<138 copies/mL). A negative result must be combined with clinical observations, patient history, and epidemiological information. The expected result is Negative.  Fact Sheet for Patients:  EntrepreneurPulse.com.au  Fact Sheet for Healthcare Providers:  IncredibleEmployment.be  This test is no t yet approved or cleared by the [place] FDA and  has been authorized for detection and/or diagnosis of SARS-CoV-2 by FDA under an Emergency Use Authorization (EUA). This EUA will remain  in effect (meaning this test can be used) for the duration of the COVID-19 declaration under Section 564(b)(1) of the Act, 21 U.S.C.section 360bbb-3(b)(1), unless the authorization is terminated  or revoked sooner.       Influenza A by PCR NEGATIVE NEGATIVE Final   Influenza B by PCR NEGATIVE NEGATIVE Final    Comment: (NOTE) The Xpert Xpress SARS-CoV-2/FLU/RSV plus assay is intended as an aid in the diagnosis of influenza from Nasopharyngeal  swab specimens and should not be used as a sole basis for treatment. Nasal washings and aspirates are unacceptable for Xpert Xpress SARS-CoV-2/FLU/RSV testing.  Fact Sheet for Patients: EntrepreneurPulse.com.au  Fact Sheet for Healthcare Providers: IncredibleEmployment.be  This test is not yet approved or cleared by the [place] FDA and has been authorized for detection and/or diagnosis of SARS-CoV-2 by FDA under an Emergency Use Authorization (EUA). This EUA will remain in effect (meaning this test can be used) for the duration of the COVID-19 declaration under Section 564(b)(1) of the Act, 21 U.S.C. section 360bbb-3(b)(1), unless the authorization is terminated or revoked.  Performed at [facility], [address] [place], [place] [address]   Culture, blood (Routine x 2)     Status: None (Preliminary result)   Collection Time: 04/20/21  8:34 AM   Specimen: BLOOD  Result Value Ref Range Status   Specimen Description BLOOD RH  Final   Special Requests   Final  BOTTLES DRAWN AEROBIC AND ANAEROBIC Blood Culture results may not be optimal due to an inadequate volume of blood received in culture bottles   Culture   Final    NO GROWTH 4 DAYS Performed at [facility], [address] [place], [place] [address]    Report Status PENDING  Incomplete  Culture, blood (Routine x 2)     Status: None (Preliminary result)   Collection Time: 04/20/21  8:39 AM   Specimen: BLOOD  Result Value Ref Range Status   Specimen Description BLOOD RH  Final   Special Requests   Final    BOTTLES DRAWN AEROBIC AND ANAEROBIC Blood Culture results may not be optimal due to an inadequate volume of blood received in culture bottles   Culture   Final    NO GROWTH 4 DAYS Performed at [facility], [address] [place], [place] [address]    Report Status PENDING  Incomplete  Urine Culture     Status: Abnormal   Collection Time:  04/21/21  3:45 AM   Specimen: Urine, Random  Result Value Ref Range Status   Specimen Description   Final    URINE, RANDOM Performed at [facility], [address] [place], [place] [address]    Special Requests   Final    NONE Performed at [facility], [address] [place], [place] [address]    Culture 50,000 COLONIES/mL STAPHYLOCOCCUS AUREUS (A)  Final   Report Status 04/23/2021 FINAL  Final   Organism ID, Bacteria STAPHYLOCOCCUS AUREUS (A)  Final      Susceptibility   Staphylococcus aureus - MIC*    CIPROFLOXACIN <=0.5 SENSITIVE Sensitive     GENTAMICIN <=0.5 SENSITIVE Sensitive     NITROFURANTOIN <=16 SENSITIVE Sensitive     OXACILLIN <=0.25 SENSITIVE Sensitive     TETRACYCLINE <=1 SENSITIVE Sensitive     VANCOMYCIN <=0.5 SENSITIVE Sensitive     TRIMETH/SULFA <=10 SENSITIVE Sensitive     CLINDAMYCIN <=0.25 SENSITIVE Sensitive     RIFAMPIN <=0.5 SENSITIVE Sensitive     Inducible Clindamycin NEGATIVE Sensitive     * 50,000 COLONIES/mL STAPHYLOCOCCUS AUREUS  Surgical pcr screen     Status: Abnormal   Collection Time: 04/21/21  7:50 AM   Specimen: Nasal Mucosa; Nasal Swab  Result Value Ref Range Status   MRSA, PCR NEGATIVE NEGATIVE Final   Staphylococcus aureus POSITIVE (A) NEGATIVE Final    Comment: (NOTE) The Xpert SA Assay (FDA approved for NASAL specimens in patients 71 years of age and older), is one component of a comprehensive surveillance program. It is not intended to diagnose infection nor to guide or monitor treatment. Performed at [facility], [address], [place], [place] [address]          Radiology Studies: No results found.      Scheduled Meds:  calcium-vitamin D   Oral QPM   Chlorhexidine Gluconate Cloth  6 each Topical Daily   cholecalciferol  1,000 Units Oral Daily   clopidogrel  75 mg Oral Daily   feeding supplement  237 mL Oral TID BM   furosemide  20 mg Oral Daily   heparin  5,000 Units  Subcutaneous Q8H   insulin aspart  0-15 Units Subcutaneous TID WC   insulin aspart  0-5 Units Subcutaneous QHS   multivitamin with minerals  1 tablet Oral Daily   mupirocin ointment  1 application Nasal BID   pantoprazole  20 mg Oral Daily   spironolactone  50 mg Oral Daily   Continuous Infusions:     LOS: 4 days       Sidney Ace, MD [facility]   If 7PM-7AM, please contact night-coverage  04/24/2021, 11:30 AM

## 2021-04-24 NOTE — Consult Note (Signed)
[facility]  CONSULT NOTE    Date: 04/24/2021                  Patient Name:  Tammy Elliott  MRN: [id number]  DOB: 04-30-1950  Age / Sex: 71 y.o., female         PCP: Baxter Hire, MD                 Service Requesting Consult: [facility]                 Reason for Consult: Acute kidney injury            History of Present Illness: Ms. Tammy Elliott is a 71 y.o.  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  She states she was in her normal state of health until the mid part of January.  She currently works from home, Risk manager.  She has progressively become weaker and unable to get around her home as previously able.  Does report some mild left-sided abdominal pain but denies nausea and vomiting.  Denies chest pain or any other discomfort.  Denies shortness of breath and cough.  States she has been battling breast cancer for 2 years.  Was active in her community, exercising and eating well.  Labs shows Creatinine 1.33 with GFR 44.  Other labs show sodium 131, glucose 58, BUN 39, albumin 1.6, AST 156 and ALT 63.  Total bilirubin 10.6. Chest x-ray shows minimal bibasilar atelectasis.  Nominal ultrasound shows diffuse hepatic metastatic disease.  Abdominal MRI shows new mild ascites and anasarca with no evidence of biliary ductal dilation.   Medications: Outpatient medications: Medications Prior to Admission  Medication Sig Dispense Refill Last Dose   CALCIUM-MAGNESIUM-ZINC PO Take 1 tablet by mouth every evening.    04/19/2021 at 1900   clopidogrel (PLAVIX) 75 MG tablet Take 75 mg by  mouth daily.   04/19/2021 at 0600   dexamethasone (DECADRON) 4 MG tablet Take 1 tablet (4 mg total) by mouth daily. 10 tablet 0 04/19/2021 at 0600   glimepiride (AMARYL) 4 MG tablet Take 4 mg by mouth 2 (two) times daily.   04/19/2021 at 1900   lovastatin (MEVACOR) 40 MG tablet Take 40 mg by mouth every evening.   04/19/2021 at 1900   metFORMIN (GLUCOPHAGE) 500 MG tablet Take 500 mg by mouth 2 (two) times daily.    04/19/2021 at 1900   Multiple Vitamin (MULTIVITAMIN) tablet Take 1 tablet by mouth daily.   04/19/2021 at 0600   Vitamin D, Cholecalciferol, 25 MCG (1000 UT) CAPS Take 1 daily 60 capsule 0 04/19/2021 at 0600   acetaminophen (TYLENOL) 500 MG tablet Take 1,000 mg by mouth every 6 (six) hours as needed for moderate pain.   prn at unknown   anastrozole (ARIMIDEX) 1 MG tablet Take 1 tablet (1 mg total) by mouth daily. (Patient not taking: Reported on 04/20/2021) 90 tablet 1 Not Taking   glucose blood (CONTOUR NEXT TEST) test strip Use once daily e11.65      JANUMET 50-500 MG tablet Take 1 tablet by mouth 2 (two) times daily. (Patient not taking: Reported on 04/20/2021)  Not Taking   JARDIANCE 25 MG TABS tablet Take 25 mg by mouth daily.  (Patient not taking: Reported on 04/20/2021)   Not Taking   lansoprazole (PREVACID) 15 MG capsule Take 15 mg by mouth daily as needed (acid reflux).   prn at unknown   Morphine Sulfate (MORPHINE CONCENTRATE) 10 mg / 0.5 ml concentrated solution Take 0.5 mLs (10 mg total) by mouth every 6 (six) hours as needed for severe pain. 118 mL 0 prn at unknown   ondansetron (ZOFRAN) 8 MG tablet Take 1 tablet (8 mg total) by mouth 2 (two) times daily as needed (Nausea or vomiting). 30 tablet 1 prn at unknown   pioglitazone (ACTOS) 30 MG tablet Take 30 mg by mouth daily. (Patient not taking: Reported on 04/20/2021)   Not Taking   prochlorperazine (COMPAZINE) 10 MG tablet Take 1 tablet (10 mg total) by mouth every 6 (six) hours as needed (Nausea or vomiting). 30 tablet 1 prn at unknown     Current medications: Current Facility-Administered Medications  Medication Dose Route Frequency Provider Last Rate Last Admin   calcium-vitamin D (OSCAL WITH D) 500-5 MG-MCG per tablet   Oral QPM Ivor Costa, MD   1 tablet at 04/23/21 1704   Chlorhexidine Gluconate Cloth 2 % PADS 6 each  6 each Topical Daily Sidney Ace, MD   6 each at 04/24/21 1015   cholecalciferol (VITAMIN D3) tablet 1,000 Units  1,000 Units Oral Daily Ivor Costa, MD   1,000 Units at 04/24/21 1015   clopidogrel (PLAVIX) tablet 75 mg  75 mg Oral Daily Ivor Costa, MD   75 mg at 04/24/21 1015   feeding supplement (ENSURE ENLIVE / ENSURE PLUS) liquid 237 mL  237 mL Oral TID BM Sreenath, Sudheer B, MD       furosemide (LASIX) tablet 20 mg  20 mg Oral Daily Earlie Server, MD   20 mg at 04/24/21 1015   heparin injection 5,000 Units  5,000 Units Subcutaneous Q8H Ivor Costa, MD   5,000 Units at 04/24/21 1431   HYDROmorphone (DILAUDID) injection 0.5 mg  0.5 mg Intravenous Q3H PRN Ralene Muskrat B, MD       insulin aspart (novoLOG) injection 0-15 Units  0-15 Units Subcutaneous TID WC Ralene Muskrat B, MD   5 Units at 04/24/21 1240   insulin aspart (novoLOG) injection 0-5 Units  0-5 Units Subcutaneous QHS Ivor Costa, MD   3 Units at 04/20/21 2349   lactulose (CHRONULAC) 10 GM/15ML solution 20 g  20 g Oral BID Earlie Server, MD   20 g at 04/24/21 1240   morphine CONCENTRATE 10 MG/0.5ML oral solution 10 mg  10 mg Oral Q6H PRN Ivor Costa, MD   10 mg at 04/24/21 1014   multivitamin with minerals tablet 1 tablet  1 tablet Oral Daily Ivor Costa, MD   1 tablet at 04/24/21 1015   mupirocin ointment (BACTROBAN) 2 % 1 application  1 application Nasal BID Ralene Muskrat B, MD   1 application at 60/10/93 1015   ondansetron (ZOFRAN) injection 4 mg  4 mg Intravenous Q8H PRN Ivor Costa, MD   4 mg at 04/22/21 2018   pantoprazole (PROTONIX) EC tablet 20 mg  20 mg Oral Daily Ivor Costa, MD   20 mg at 04/24/21 1015   pneumococcal 23 valent  vaccine (PNEUMOVAX-23) injection 0.5 mL  0.5 mL Intramuscular Prior to discharge Ivor Costa, MD       spironolactone (ALDACTONE) tablet 50 mg  50 mg Oral  Daily Earlie Server, MD   50 mg at 04/24/21 1015      Allergies: Allergies  Allergen Reactions   Penicillins Hives and Swelling   Exenatide Nausea Only   Orlistat Diarrhea   Thiazide-Type Diuretics     Unknown reaction      Past Medical History: Past Medical History:  Diagnosis Date   Aortic atherosclerosis (HCC)    CAD (coronary artery disease) 04/12/2010   a.) PCI --> 85% pLAD --> 2.5 x 15 mm Xience V DES x 1   Chronic anticoagulation    Rivaroxaban   Chronic ischemic heart disease    DOE (dyspnea on exertion)    Family history of uterine cancer    GERD (gastroesophageal reflux disease)    Hypercholesterolemia    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   a.) Stage IA (cT1b, cN0, cM0, G2, ER+, PR+, HER2-); b.) adjuvant endocrine therapy started 06/2019; c.) s/p total mastectomy, SNLB, and breast reconstruction in 04/2019   Liver cancer ([place])    Skin cancer, basal cell    s/p excision   Superior mesenteric artery stenosis (HCC)    T2DM (type 2 diabetes mellitus) ([place])      Past Surgical History: Past Surgical History:  Procedure Laterality Date   BREAST BIOPSY Left 03/11/2019   Stereotactic CNB; pathology --> ER/PR (-), HER2/neu (-) IMC iwth lobular features   BREAST RECONSTRUCTION WITH PLACEMENT OF TISSUE EXPANDER AND FLEX HD (ACELLULAR HYDRATED DERMIS) Left 05/03/2019   Procedure: LEFT BREAST RECONSTRUCTION WITH PLACEMENT OF TISSUE EXPANDER AND FLEX HD (ACELLULAR HYDRATED DERMIS);  Surgeon: Wallace Going, DO;  Location: [facility];  Service: Plastics;  Laterality: Left;   CHOLECYSTECTOMY     COLONOSCOPY     CORONARY ANGIOPLASTY WITH STENT PLACEMENT Left 04/12/2010   Procedure: LHC with placement of 2.5 x 15 mm Xience V DES x 1 to pLAD; Location: [facility]; Surgeon: Isaias Cowman, MD    EXCISION OF BREAST BIOPSY Left 11/17/2020   Procedure: EXCISION OF BREAST BIOPSY;  Surgeon: Herbert Pun, MD;  Location: [facility];  Service: General;  Laterality: Left;   LIPOSUCTION WITH LIPOFILLING Bilateral 01/27/2020   Procedure: LIPOSUCTION WITH LIPOFILLING;  Surgeon: Wallace Going, DO;  Location: [facility];  Service: Plastics;  Laterality: Bilateral;  90 min   MASTOPEXY Right 07/01/2019   Procedure: MASTOPEXY;  Surgeon: Wallace Going, DO;  Location: [facility];  Service: Plastics;  Laterality: Right;   REMOVAL OF TISSUE EXPANDER AND PLACEMENT OF IMPLANT Left 07/01/2019   Procedure: REMOVAL OF TISSUE EXPANDER AND PLACEMENT OF IMPLANT;  Surgeon: Wallace Going, DO;  Location: [facility];  Service: Plastics;  Laterality: Left;  total case 2.5 hours   TOTAL MASTECTOMY Left 05/03/2019   Procedure: TOTAL MASTECTOMY W/ Sentinel Node;  Surgeon: Herbert Pun, MD;  Location: [facility];  Service: General;  Laterality: Left;   VISCERAL ANGIOGRAPHY N/A 05/01/2020   Procedure: VISCERAL ANGIOGRAPHY;  Surgeon: Algernon Huxley, MD;  Location: [facility];  Service: Cardiovascular;  Laterality: N/A;     Family History: Family History  Problem Relation Age of Onset   CAD Mother    Diabetes Mellitus II Mother    Hypertension Mother    Uterine cancer Mother        dx late 61s   CAD Father    Heart attack Father    Hypertension Father    Diabetes Mellitus II Father  Took insulin   Alcohol abuse Father    Diabetes Mellitus II Sister    Cancer Sister        possible cervical or uterine   Lung cancer Paternal Aunt    Cancer Paternal Uncle        unk type   Diabetes Other        Almost all sisters are diabetic or pre-diabetic    Cancer Cousin        possible colon/stomach/something in abdomen   Breast cancer Neg Hx      Social History: Social History   Socioeconomic History   Marital status:  Married    Spouse name: Not on file   Number of children: Not on file   Years of education: Not on file   Highest education level: Not on file  Occupational History   Occupation: Printmaker: [employer]  Tobacco Use   Smoking status: Never   Smokeless tobacco: Never  Vaping Use   Vaping Use: Never used  Substance and Sexual Activity   Alcohol use: Not Currently    Comment: rarely   Drug use: Never   Sexual activity: Not on file  Other Topics Concern   Not on file  Social History Narrative   reports that she has never smoked. She has never used smokeless tobacco. She reports that she rarely drinks alcohol. She reports that she does not use drugs.      Married to [name] ([name]) [name]    Social Determinants of Health   Financial Resource Strain: Not on file  Food Insecurity: Not on file  Transportation Needs: Not on file  Physical Activity: Not on file  Stress: Not on file  Social Connections: Not on file  Intimate Partner Violence: Not on file     Review of Systems: Review of Systems  Constitutional:  Positive for chills, fever and malaise/fatigue.  HENT:  Negative for congestion, sore throat and tinnitus.   Eyes:  Negative for blurred vision and redness.  Respiratory:  Negative for cough, shortness of breath and wheezing.   Cardiovascular:  Negative for chest pain, palpitations, claudication and leg swelling.  Gastrointestinal:  Negative for abdominal pain, blood in stool, diarrhea, nausea and vomiting.  Genitourinary:  Negative for flank pain, frequency and hematuria.  Musculoskeletal:  Negative for back pain, falls and myalgias.  Skin:  Negative for rash.  Neurological:  Positive for weakness. Negative for dizziness and headaches.  Endo/Heme/Allergies:  Does not bruise/bleed easily.  Psychiatric/Behavioral:  Negative for depression. The patient is not nervous/anxious and does not have insomnia.    Vital Signs: Blood pressure (!) 139/58, pulse 84,  temperature 98.5 F (36.9 C), temperature source Oral, resp. rate 16, height _0  (1.727 m), weight 77.1 kg, SpO2 96 %.  Weight trends: Filed Weights   04/20/21 0829  Weight: 77.1 kg    Physical Exam: General: NAD  Head: Normocephalic, atraumatic. Moist oral mucosal membranes  Eyes: Anicteric, jaundice sclera  Lungs:  Clear to auscultation, normal effort  Heart: Regular rate and rhythm  Abdomen:  Soft, nontender, mild distention  Extremities: 2+ peripheral edema.  Neurologic: Nonfocal, moving all four extremities  Skin: Jaundiced        Lab results: Basic Metabolic Panel: Recent Labs  Lab 04/22/21 0341 04/23/21 0526 04/24/21 0508  NA 128* 130* 131*  K 3.7 4.1 3.6  CL 103 100 105  CO2 16* 18* 17*  GLUCOSE 141* 204* 58*  BUN 39* 36* 39*  CREATININE 1.09* 1.13* 1.33*  CALCIUM 7.8* 8.2* 8.0*    Liver Function Tests: Recent Labs  Lab 04/22/21 0341 04/23/21 0526 04/24/21 0508  AST 189* 168* 156*  ALT 88* 80* 63*  ALKPHOS 362* 361* 357*  BILITOT 11.5* 12.3* 10.6*  PROT 5.7* 6.1* 5.7*  ALBUMIN 1.7* 1.8* 1.6*   No results for input(s): LIPASE, AMYLASE in the last 168 hours. Recent Labs  Lab 04/24/21 0508  AMMONIA 54*    CBC: Recent Labs  Lab 04/20/21 0829 04/21/21 0400  WBC 11.5* 10.4  NEUTROABS 8.9*  --   HGB 10.6* 9.7*  HCT 33.2* 30.2*  MCV 98.8 96.8  PLT 330 270    Cardiac Enzymes: No results for input(s): CKTOTAL, CKMB, CKMBINDEX, TROPONINI in the last 168 hours.  BNP: Invalid input(s): POCBNP  CBG: Recent Labs  Lab 04/23/21 1113 04/23/21 1655 04/23/21 2046 04/24/21 0748 04/24/21 1151  GLUCAP 178* 174* 106* 71 225*    Microbiology: Results for orders placed or performed during the hospital encounter of 04/20/21  Resp Panel by RT-PCR (Flu A&B, Covid) Nasopharyngeal Swab     Status: None   Collection Time: 04/20/21  8:29 AM   Specimen: Nasopharyngeal Swab; Nasopharyngeal(NP) swabs in vial transport medium  Result Value Ref Range  Status   SARS Coronavirus 2 by RT PCR NEGATIVE NEGATIVE Final    Comment: (NOTE) SARS-CoV-2 target nucleic acids are NOT DETECTED.  The SARS-CoV-2 RNA is generally detectable in upper respiratory specimens during the acute phase of infection. The lowest concentration of SARS-CoV-2 viral copies this assay can detect is 138 copies/mL. A negative result does not preclude SARS-Cov-2 infection and should not be used as the sole basis for treatment or other patient management decisions. A negative result may occur with  improper specimen collection/handling, submission of specimen other than nasopharyngeal swab, presence of viral mutation(s) within the areas targeted by this assay, and inadequate number of viral copies(<138 copies/mL). A negative result must be combined with clinical observations, patient history, and epidemiological information. The expected result is Negative.  Fact Sheet for Patients:  EntrepreneurPulse.com.au  Fact Sheet for Healthcare Providers:  IncredibleEmployment.be  This test is no t yet approved or cleared by the [place] FDA and  has been authorized for detection and/or diagnosis of SARS-CoV-2 by FDA under an Emergency Use Authorization (EUA). This EUA will remain  in effect (meaning this test can be used) for the duration of the COVID-19 declaration under Section 564(b)(1) of the Act, 21 U.S.C.section 360bbb-3(b)(1), unless the authorization is terminated  or revoked sooner.       Influenza A by PCR NEGATIVE NEGATIVE Final   Influenza B by PCR NEGATIVE NEGATIVE Final    Comment: (NOTE) The Xpert Xpress SARS-CoV-2/FLU/RSV plus assay is intended as an aid in the diagnosis of influenza from Nasopharyngeal swab specimens and should not be used as a sole basis for treatment. Nasal washings and aspirates are unacceptable for Xpert Xpress SARS-CoV-2/FLU/RSV testing.  Fact Sheet for  Patients: EntrepreneurPulse.com.au  Fact Sheet for Healthcare Providers: IncredibleEmployment.be  This test is not yet approved or cleared by the [place] FDA and has been authorized for detection and/or diagnosis of SARS-CoV-2 by FDA under an Emergency Use Authorization (EUA). This EUA will remain in effect (meaning this test can be used) for the duration of the COVID-19 declaration under Section 564(b)(1) of the Act, 21 U.S.C. section 360bbb-3(b)(1), unless the authorization is terminated or revoked.  Performed at [facility], [address] [place],  [place] [address]   Culture, blood (Routine x 2)     Status: None (Preliminary result)   Collection Time: 04/20/21  8:34 AM   Specimen: BLOOD  Result Value Ref Range Status   Specimen Description BLOOD RH  Final   Special Requests   Final    BOTTLES DRAWN AEROBIC AND ANAEROBIC Blood Culture results may not be optimal due to an inadequate volume of blood received in culture bottles   Culture   Final    NO GROWTH 4 DAYS Performed at [facility], [address], [place], [place] [address]    Report Status PENDING  Incomplete  Culture, blood (Routine x 2)     Status: None (Preliminary result)   Collection Time: 04/20/21  8:39 AM   Specimen: BLOOD  Result Value Ref Range Status   Specimen Description BLOOD RH  Final   Special Requests   Final    BOTTLES DRAWN AEROBIC AND ANAEROBIC Blood Culture results may not be optimal due to an inadequate volume of blood received in culture bottles   Culture   Final    NO GROWTH 4 DAYS Performed at [facility], [address], [place], [place] [address]    Report Status PENDING  Incomplete  Urine Culture     Status: Abnormal   Collection Time: 04/21/21  3:45 AM   Specimen: Urine, Random  Result Value Ref Range Status   Specimen Description   Final    URINE, RANDOM Performed at [facility], [address] [place], [place] [address]    Special Requests   Final    NONE Performed at [facility], [address] [place], [place] [address]    Culture 50,000 COLONIES/mL STAPHYLOCOCCUS AUREUS (A)  Final   Report Status 04/23/2021 FINAL  Final   Organism ID, Bacteria STAPHYLOCOCCUS AUREUS (A)  Final      Susceptibility   Staphylococcus aureus - MIC*    CIPROFLOXACIN <=0.5 SENSITIVE Sensitive     GENTAMICIN <=0.5 SENSITIVE Sensitive     NITROFURANTOIN <=16 SENSITIVE Sensitive     OXACILLIN <=0.25 SENSITIVE Sensitive     TETRACYCLINE <=1 SENSITIVE Sensitive     VANCOMYCIN <=0.5 SENSITIVE Sensitive     TRIMETH/SULFA <=10 SENSITIVE Sensitive     CLINDAMYCIN <=0.25 SENSITIVE Sensitive     RIFAMPIN <=0.5 SENSITIVE Sensitive     Inducible Clindamycin NEGATIVE Sensitive     * 50,000 COLONIES/mL STAPHYLOCOCCUS AUREUS  Surgical pcr screen     Status: Abnormal   Collection Time: 04/21/21  7:50 AM   Specimen: Nasal Mucosa; Nasal Swab  Result Value Ref Range Status   MRSA, PCR NEGATIVE NEGATIVE Final   Staphylococcus aureus POSITIVE (A) NEGATIVE Final    Comment: (NOTE) The Xpert SA Assay (FDA approved for NASAL specimens in patients 90 years of age and older), is one component of a comprehensive surveillance program. It is not intended to diagnose infection nor to guide or monitor treatment. Performed at [facility], [address] [place], [place] [address]     Coagulation Studies: No results for input(s): LABPROT, INR in the last 72 hours.  Urinalysis: No results for input(s): COLORURINE, LABSPEC, PHURINE, GLUCOSEU, HGBUR, BILIRUBINUR, KETONESUR, PROTEINUR, UROBILINOGEN, NITRITE, LEUKOCYTESUR in the last 72 hours.  Invalid input(s): APPERANCEUR    Imaging: No results found.   Assessment & Plan: Ms. DELLIE PIASECKI is a 71 y.o.  female with previous medical concerns including CAD, diabetes, hyperlipidemia, SMA stenosis and breast cancer  with metastasis to the  liver, who was admitted to [facility] on 04/20/2021 for Jaundice [R17] Severe sepsis ([place]) [A41.9, R65.20] Sepsis, due to unspecified organism, unspecified whether acute organ dysfunction present ([place]) [A41.9]  Acute kidney injury with suspected hepatorenal syndrome.  Normal renal function recorded on 04/16/2021.  Creatinine currently 1.33 with GFR 44.  Mild lower extremity edema with minimal abdominal.  Continue low-dose diuretic We will DC NSAID Avoid all nephrotoxic agents and therapies. Avoid hypotension.  We will prescribe midodrine 5 mg 3 times daily to maintain adequate blood pressure.  2.  Liver metastasis and metastatic breast cancer  -Oncology following  -Albumin 1.6.  We will supplement with albumin 25 g twice daily.   LOS: [place] 2/14/20233:50 PM

## 2021-04-25 ENCOUNTER — Inpatient Hospital Stay: Payer: Medicare Other

## 2021-04-25 DIAGNOSIS — C50919 Malignant neoplasm of unspecified site of unspecified female breast: Secondary | ICD-10-CM | POA: Diagnosis not present

## 2021-04-25 DIAGNOSIS — R17 Unspecified jaundice: Secondary | ICD-10-CM | POA: Diagnosis not present

## 2021-04-25 DIAGNOSIS — R6510 Systemic inflammatory response syndrome (SIRS) of non-infectious origin without acute organ dysfunction: Secondary | ICD-10-CM | POA: Diagnosis not present

## 2021-04-25 DIAGNOSIS — C787 Secondary malignant neoplasm of liver and intrahepatic bile duct: Secondary | ICD-10-CM | POA: Diagnosis not present

## 2021-04-25 LAB — BODY FLUID CELL COUNT WITH DIFFERENTIAL
Eos, Fluid: 0 %
Lymphs, Fluid: 9 %
Monocyte-Macrophage-Serous Fluid: 68 %
Neutrophil Count, Fluid: 23 %
Total Nucleated Cell Count, Fluid: 209 cu mm

## 2021-04-25 LAB — GLUCOSE, CAPILLARY
Glucose-Capillary: 94 mg/dL (ref 70–99)
Glucose-Capillary: 176 mg/dL — ABNORMAL HIGH (ref 70–99)
Glucose-Capillary: 156 mg/dL — ABNORMAL HIGH (ref 70–99)
Glucose-Capillary: 190 mg/dL — ABNORMAL HIGH (ref 70–99)

## 2021-04-25 LAB — CULTURE, BLOOD (ROUTINE X 2)
Culture: NO GROWTH
Culture: NO GROWTH

## 2021-04-25 LAB — COMPREHENSIVE METABOLIC PANEL
ALT: 62 U/L — ABNORMAL HIGH (ref 0–44)
AST: 167 U/L — ABNORMAL HIGH (ref 15–41)
Albumin: 1.7 g/dL — ABNORMAL LOW (ref 3.5–5.0)
Alkaline Phosphatase: 364 U/L — ABNORMAL HIGH (ref 38–126)
Anion gap: 9 (ref 5–15)
BUN: 43 mg/dL — ABNORMAL HIGH (ref 8–23)
CO2: 15 mmol/L — ABNORMAL LOW (ref 22–32)
Calcium: 8 mg/dL — ABNORMAL LOW (ref 8.9–10.3)
Chloride: 107 mmol/L (ref 98–111)
Creatinine, Ser: 1.6 mg/dL — ABNORMAL HIGH (ref 0.44–1.00)
GFR, Estimated: 35 mL/min — ABNORMAL LOW (ref >60)
Glucose, Bld: 120 mg/dL — ABNORMAL HIGH (ref 70–99)
Potassium: 4.2 mmol/L (ref 3.5–5.1)
Sodium: 131 mmol/L — ABNORMAL LOW (ref 135–145)
Total Bilirubin: 9.3 mg/dL — ABNORMAL HIGH (ref 0.3–1.2)
Total Protein: 5.6 g/dL — ABNORMAL LOW (ref 6.5–8.1)

## 2021-04-25 MED ORDER — MIDODRINE HCL 5 MG PO TABS
5.0000 mg | ORAL_TABLET | Freq: Three times a day (TID) | ORAL | Status: DC
  Administered 2021-04-25 – 2021-04-26 (×4): 5 mg via ORAL
  Filled 2021-04-25 (×4): qty 1

## 2021-04-25 MED ORDER — SENNOSIDES-DOCUSATE SODIUM 8.6-50 MG PO TABS: 1.0000 | ORAL_TABLET | Freq: Every evening | ORAL | Status: DC | PRN

## 2021-04-25 MED ORDER — METOPROLOL TARTRATE 5 MG/5ML IV SOLN: 5.0000 mg | INTRAVENOUS | Status: DC | PRN

## 2021-04-25 MED ORDER — HYDRALAZINE HCL 20 MG/ML IJ SOLN: 10.0000 mg | INTRAMUSCULAR | Status: DC | PRN

## 2021-04-25 MED ORDER — ALBUMIN HUMAN 25 % IV SOLN
12.5000 g | Freq: Two times a day (BID) | INTRAVENOUS | Status: DC
  Administered 2021-04-25 – 2021-04-26 (×3): 12.5 g via INTRAVENOUS
  Filled 2021-04-25 (×4): qty 50

## 2021-04-25 MED ORDER — IPRATROPIUM-ALBUTEROL 0.5-2.5 (3) MG/3ML IN SOLN: 3.0000 mL | RESPIRATORY_TRACT | Status: DC | PRN

## 2021-04-25 MED ORDER — GUAIFENESIN 100 MG/5ML PO LIQD: 5.0000 mL | ORAL | Status: DC | PRN

## 2021-04-25 NOTE — Procedures (Signed)
PROCEDURE SUMMARY:  Successful US guided paracentesis from right abdomen.  Yielded 1.6 L of clear yellow fluid.  No immediate complications.  Pt tolerated well.   Specimen sent for labs.  EBL < 2 mL  Theresa Duty, NP 04/25/2021 3:13 PM

## 2021-04-25 NOTE — Progress Notes (Addendum)
Manufacturing engineer [facility]) Hospital Liaison Note  Received request from [employer] [place] for family interest in [facility]. Visited patient at bedside and spoke with [name] to confirm interest and explain services.  Approval for Hospice Home is determined by [facility] MD. Once [facility] MD has determined Hospice Home eligibility, [place] will update hospital staff and family. Eligibility is: approved.   Please do not hesitate to call with any hospice related questions.    Thank you for the opportunity to participate in this patient's care.  Daphene Calamity, MSW [facility] Liaison  [phone]

## 2021-04-25 NOTE — Progress Notes (Signed)
[facility]  ROUNDING NOTE   Subjective:   Patient seen resting quietly Husband at bedside Maintains appropriate appetite Denies shortness of breath  Creatinine increased 1.6   Objective:  Vital signs in last 24 hours:  Temp:  [98.2 F (36.8 C)-98.5 F (36.9 C)] 98.5 F (36.9 C) (02/15 0757) Pulse Rate:  [80-87] 85 (02/15 0757) Resp:  [16-17] 17 (02/15 0757) BP: (137-149)/(58-73) 149/73 (02/15 0757) SpO2:  [96 %-100 %] 99 % (02/15 0757)  Weight change:  Filed Weights   04/20/21 0829  Weight: 77.1 kg    Intake/Output: I/O last 3 completed shifts: In: 320 [P.O.:320] Out: 250 [Urine:250]   Intake/Output this shift:  Total I/O In: 240 [P.O.:240] Out: -   Physical Exam: General: NAD  Head: Normocephalic, atraumatic. Moist oral mucosal membranes  Eyes: Anicteric  Lungs:  Clear to auscultation, normal effort  Heart: Regular rate and rhythm  Abdomen:  Soft, nontender  Extremities:  2+ peripheral edema.  Neurologic: Nonfocal, moving all four extremities  Skin: No lesions       Basic Metabolic Panel: Recent Labs  Lab 04/21/21 0523 04/22/21 0341 04/23/21 0526 04/24/21 0508 04/25/21 0439  NA 127* 128* 130* 131* 131*  K 4.2 3.7 4.1 3.6 4.2  CL 99 103 100 105 107  CO2 19* 16* 18* 17* 15*  GLUCOSE 259* 141* 204* 58* 120*  BUN 36* 39* 36* 39* 43*  CREATININE 1.40* 1.09* 1.13* 1.33* 1.60*  CALCIUM 7.9* 7.8* 8.2* 8.0* 8.0*    Liver Function Tests: Recent Labs  Lab 04/21/21 0400 04/22/21 0341 04/23/21 0526 04/24/21 0508 04/25/21 0439  AST 291* 189* 168* 156* 167*  ALT 112* 88* 80* 63* 62*  ALKPHOS 398* 362* 361* 357* 364*  BILITOT 12.2* 11.5* 12.3* 10.6* 9.3*  PROT 6.0* 5.7* 6.1* 5.7* 5.6*  ALBUMIN 2.0* 1.7* 1.8* 1.6* 1.7*   No results for input(s): LIPASE, AMYLASE in the last 168 hours. Recent Labs  Lab 04/24/21 0508  AMMONIA 54*    CBC: Recent Labs  Lab 04/20/21 0829 04/21/21 0400  WBC 11.5* 10.4  NEUTROABS 8.9*  --    HGB 10.6* 9.7*  HCT 33.2* 30.2*  MCV 98.8 96.8  PLT 330 270    Cardiac Enzymes: No results for input(s): CKTOTAL, CKMB, CKMBINDEX, TROPONINI in the last 168 hours.  BNP: Invalid input(s): POCBNP  CBG: Recent Labs  Lab 04/24/21 1151 04/24/21 1649 04/24/21 2149 04/25/21 0759 04/25/21 1125  GLUCAP 225* 204* 179* 94 176*    Microbiology: Results for orders placed or performed during the hospital encounter of 04/20/21  Resp Panel by RT-PCR (Flu A&B, Covid) Nasopharyngeal Swab     Status: None   Collection Time: 04/20/21  8:29 AM   Specimen: Nasopharyngeal Swab; Nasopharyngeal(NP) swabs in vial transport medium  Result Value Ref Range Status   SARS Coronavirus 2 by RT PCR NEGATIVE NEGATIVE Final    Comment: (NOTE) SARS-CoV-2 target nucleic acids are NOT DETECTED.  The SARS-CoV-2 RNA is generally detectable in upper respiratory specimens during the acute phase of infection. The lowest concentration of SARS-CoV-2 viral copies this assay can detect is 138 copies/mL. A negative result does not preclude SARS-Cov-2 infection and should not be used as the sole basis for treatment or other patient management decisions. A negative result may occur with  improper specimen collection/handling, submission of specimen other than nasopharyngeal swab, presence of viral mutation(s) within the areas targeted by this assay, and inadequate number of viral copies(<138 copies/mL). A negative result must be  combined with clinical observations, patient history, and epidemiological information. The expected result is Negative.  Fact Sheet for Patients:  EntrepreneurPulse.com.au  Fact Sheet for Healthcare Providers:  IncredibleEmployment.be  This test is no t yet approved or cleared by the [place] FDA and  has been authorized for detection and/or diagnosis of SARS-CoV-2 by FDA under an Emergency Use Authorization (EUA). This EUA will remain  in  effect (meaning this test can be used) for the duration of the COVID-19 declaration under Section 564(b)(1) of the Act, 21 U.S.C.section 360bbb-3(b)(1), unless the authorization is terminated  or revoked sooner.       Influenza A by PCR NEGATIVE NEGATIVE Final   Influenza B by PCR NEGATIVE NEGATIVE Final    Comment: (NOTE) The Xpert Xpress SARS-CoV-2/FLU/RSV plus assay is intended as an aid in the diagnosis of influenza from Nasopharyngeal swab specimens and should not be used as a sole basis for treatment. Nasal washings and aspirates are unacceptable for Xpert Xpress SARS-CoV-2/FLU/RSV testing.  Fact Sheet for Patients: EntrepreneurPulse.com.au  Fact Sheet for Healthcare Providers: IncredibleEmployment.be  This test is not yet approved or cleared by the [place] FDA and has been authorized for detection and/or diagnosis of SARS-CoV-2 by FDA under an Emergency Use Authorization (EUA). This EUA will remain in effect (meaning this test can be used) for the duration of the COVID-19 declaration under Section 564(b)(1) of the Act, 21 U.S.C. section 360bbb-3(b)(1), unless the authorization is terminated or revoked.  Performed at [facility], [address] [place], [place] [address]   Culture, blood (Routine x 2)     Status: None   Collection Time: 04/20/21  8:34 AM   Specimen: BLOOD  Result Value Ref Range Status   Specimen Description BLOOD RH  Final   Special Requests   Final    BOTTLES DRAWN AEROBIC AND ANAEROBIC Blood Culture results may not be optimal due to an inadequate volume of blood received in culture bottles   Culture   Final    NO GROWTH 5 DAYS Performed at [facility], [address] [place], [place] [address]    Report Status 04/25/2021 FINAL  Final  Culture, blood (Routine x 2)     Status: None   Collection Time: 04/20/21  8:39 AM   Specimen: BLOOD  Result Value Ref Range Status   Specimen  Description BLOOD RH  Final   Special Requests   Final    BOTTLES DRAWN AEROBIC AND ANAEROBIC Blood Culture results may not be optimal due to an inadequate volume of blood received in culture bottles   Culture   Final    NO GROWTH 5 DAYS Performed at [facility], [address], [place], [place] [address]    Report Status 04/25/2021 FINAL  Final  Urine Culture     Status: Abnormal   Collection Time: 04/21/21  3:45 AM   Specimen: Urine, Random  Result Value Ref Range Status   Specimen Description   Final    URINE, RANDOM Performed at [facility], [address], [place], [place] [address]    Special Requests   Final    NONE Performed at [facility], [place], [place] [address]    Culture 50,000 COLONIES/mL STAPHYLOCOCCUS AUREUS (A)  Final   Report Status 04/23/2021 FINAL  Final   Organism ID, Bacteria STAPHYLOCOCCUS AUREUS (A)  Final      Susceptibility   Staphylococcus aureus - MIC*    CIPROFLOXACIN <=0.5 SENSITIVE  Sensitive     GENTAMICIN <=0.5 SENSITIVE Sensitive     NITROFURANTOIN <=16 SENSITIVE Sensitive     OXACILLIN <=0.25 SENSITIVE Sensitive     TETRACYCLINE <=1 SENSITIVE Sensitive     VANCOMYCIN <=0.5 SENSITIVE Sensitive     TRIMETH/SULFA <=10 SENSITIVE Sensitive     CLINDAMYCIN <=0.25 SENSITIVE Sensitive     RIFAMPIN <=0.5 SENSITIVE Sensitive     Inducible Clindamycin NEGATIVE Sensitive     * 50,000 COLONIES/mL STAPHYLOCOCCUS AUREUS  Surgical pcr screen     Status: Abnormal   Collection Time: 04/21/21  7:50 AM   Specimen: Nasal Mucosa; Nasal Swab  Result Value Ref Range Status   MRSA, PCR NEGATIVE NEGATIVE Final   Staphylococcus aureus POSITIVE (A) NEGATIVE Final    Comment: (NOTE) The Xpert SA Assay (FDA approved for NASAL specimens in patients 53 years of age and older), is one component of a comprehensive surveillance program. It is not intended to diagnose infection nor to guide or monitor treatment. Performed  at [facility], [address] [place], [place] [address]     Coagulation Studies: No results for input(s): LABPROT, INR in the last 72 hours.  Urinalysis: No results for input(s): COLORURINE, LABSPEC, PHURINE, GLUCOSEU, HGBUR, BILIRUBINUR, KETONESUR, PROTEINUR, UROBILINOGEN, NITRITE, LEUKOCYTESUR in the last 72 hours.  Invalid input(s): APPERANCEUR    Imaging: No results found.   Medications:    albumin human 12.5 g (04/25/21 1210)    calcium-vitamin D   Oral QPM   cholecalciferol  1,000 Units Oral Daily   clopidogrel  75 mg Oral Daily   feeding supplement  237 mL Oral TID BM   furosemide  20 mg Oral Daily   heparin  5,000 Units Subcutaneous Q8H   insulin aspart  0-15 Units Subcutaneous TID WC   insulin aspart  0-5 Units Subcutaneous QHS   lactulose  20 g Oral BID   midodrine  5 mg Oral TID WC   multivitamin with minerals  1 tablet Oral Daily   mupirocin ointment  1 application Nasal BID   pantoprazole  20 mg Oral Daily   spironolactone  50 mg Oral Daily   guaiFENesin, hydrALAZINE, HYDROmorphone (DILAUDID) injection, ipratropium-albuterol, metoprolol tartrate, morphine CONCENTRATE, ondansetron (ZOFRAN) IV, pneumococcal 23 valent vaccine, senna-docusate  Assessment/ Plan:  Tammy Elliott is a 71 y.o.  female with previous medical concerns including CAD, diabetes, hyperlipidemia, SMA stenosis and breast cancer with metastasis to the liver, who was admitted to [facility] on 04/20/2021 for Jaundice [R17] Severe sepsis ([place]) [A41.9, R65.20] Sepsis, due to unspecified organism, unspecified whether acute organ dysfunction present ([place]) [A41.9]   Acute kidney injury with suspected hepatorenal syndrome.  Normal renal function recorded on 04/16/2021.  Creatinine currently 1.33 with GFR 44.  Mild lower extremity edema with minimal abdominal.  Continue low-dose diuretic Avoid all nephrotoxic agents and therapies. Avoid hypotension.  Continue midodrine 5 mg 3 times daily to  maintain adequate blood pressure.  Lab Results  Component Value Date   CREATININE 1.60 (H) 04/25/2021   CREATININE 1.33 (H) 04/24/2021   CREATININE 1.13 (H) 04/23/2021    Intake/Output Summary (Last 24 hours) at 04/25/2021 1317 Last data filed at 04/25/2021 1036 Gross per 24 hour  Intake 440 ml  Output --  Net 440 ml   2.   Liver metastasis and metastatic breast cancer             -Oncology following             -Albumin  1.7.  We will supplement with albumin 25 g twice daily.  - Scheduled paracentesis to evaluate ascites   LOS: 5 Stana Bayon 2/15/20231:17 PM

## 2021-04-25 NOTE — TOC Progression Note (Signed)
Transition of Care [facility]) - Progression Note    Patient Details  Name: Tammy Elliott MRN: [id number] Date of Birth: 06-12-1950  [facility]) CM/SW Contact  Beverly Sessions, RN Phone Number: 04/25/2021, 3:14 PM  Clinical Narrative:     Notified by MD that patient and husband are interested in hospice  Went to patient's room.  She is off the floor.  Reached out to husband by phone  Husband confirms his wishes are for residential hospice in [place]. It is not clear of prognosis.  I have let husband know if patient does not qualify for residential hospice, home with hospice may be the only other option.   Referral made to [facility] with [facility]     Barriers to Discharge: Continued Medical Work up  Expected Discharge Plan and Services                           DME Arranged: Gilford Rile rolling DME Agency: [employer] Date DME Agency Contacted: 04/22/21 Time DME Agency Contacted: [phone] Representative spoke with at DME Agency: [place] ([place]) Interventions    Readmission Risk Interventions No flowsheet data found.

## 2021-04-25 NOTE — Progress Notes (Signed)
PROGRESS NOTE    Tammy Elliott  MWN:[id number] DOB: February 02, 1951 DOA: 04/20/2021 PCP: Baxter Hire, MD   Brief Narrative:  71 year old female with metastatic breast cancer to liver, DM 2, CAD status post PCI, SMA stenosis admitted for fever and chills.  No obvious source was found besides possible UTI.  She did receive broad-spectrum antibiotics for 5 days and eventually discontinued on 2/14.  Urine cultures have grown Staph aureus.  She does have significant metastatic breast cancer, oncology team is following.  Due to liver metastases she does have transaminitis but no obvious evidence of obstruction seen on MRCP.  Palliative care team is following.   Assessment & Plan:   Sepsis from urinary tract infection secondary to Staph aureus. -Patient treated with 5 days of antibiotics.  Sepsis physiology has resolved.   Liver metastasis and Metastatic breast cancer [facility]) -Patient has been followed by oncology team.  Limited options.  Patient received 1 dose of fulvestrant 500 mg on 2/13. PPI daily.  -Overall very poor prognosis.  Abnormal LFTs Hyperbilirubinemia -Secondary to liver metastases.  No obstruction seen on the MRCP.   Anasarca, ascites -lasix 20mg  po daily and Aldactone 50mg  po daily  AKI (acute kidney injury) ([place]) Metabolic Acidosis -DC IV fluids nephrology team is following. Cr 1.3 > 1.6 There is a concern for hepatorenal syndrome.  Midodrine 5mg  tid  Hypoalbuminemia - Albumin supplements   CAD (coronary artery disease) -No Chest pain, cont Plavix, statin on hold due to elevated LFTs   Diabetes mellitus type 2 with complications (HCC)  Recent A1c 7.0..  Home meds on hold.  Sliding scale and Accu-Chek.   GERD (gastroesophageal reflux disease) -Protonix   Hyperlipemia -Hold atorvastatin due to abnormal liver function   Superior mesenteric artery stenosis (HCC) -Plavix   Hyponatremia -From hypovolemia getting IV fluids as necessary  Goals of care - Patient  overall has very poor prognosis with metastatic breast cancer.  Palliative care team is following.   DVT prophylaxis: SQ Heparin Code Status: DNR Family Communication:  Husband at Bedside  Status is: Inpatient Remains inpatient appropriate because: Still quite weak, needing IVF. Awaiting family to determine if they would like to go to hospice.       Nutritional status  Nutrition Problem: Increased nutrient needs Etiology: chronic illness (cancer)  Signs/Symptoms: estimated needs  Interventions: Ensure Enlive (each supplement provides 350kcal and 20 grams of protein)  Body mass index is 25.85 kg/m.       Subjective: Patient still feels weak overall. Per husband her mentation is fluctuating.  They are still trying to process and determine if they would like to have any form of treatment or persue Hospice route.   Review of Systems Otherwise negative except as per HPI, including: General: Denies fever, chills, night sweats or unintended weight loss. Resp: Denies cough, wheezing, shortness of breath. Cardiac: Denies chest pain, palpitations, orthopnea, paroxysmal nocturnal dyspnea. GI: Denies abdominal pain, nausea, vomiting, diarrhea or constipation GU: Denies dysuria, frequency, hesitancy or incontinence MS: Denies muscle aches, joint pain or swelling Neuro: Denies headache, neurologic deficits (focal weakness, numbness, tingling), abnormal gait Psych: Denies anxiety, depression, SI/HI/AVH Skin: Denies new rashes or lesions ID: Denies sick contacts, exotic exposures, travel  Examination:  General exam: Appears calm and comfortable. Chronically ill appearing.  Respiratory system: Clear to auscultation. Respiratory effort normal. Cardiovascular system: S1 & S2 heard, RRR. No JVD, murmurs, rubs, gallops or clicks. No pedal edema. Gastrointestinal system: Abdomen is nondistended, soft and nontender. No organomegaly or  masses felt. Normal bowel sounds heard. Central  nervous system: Alert and oriented. No focal neurological deficits. Extremities: Symmetric 5 x 5 power. Skin: Jaundiced.  Psychiatry: Judgement and insight appear normal. Mood & affect appropriate.     Objective: Vitals:   04/24/21 1448 04/24/21 2340 04/25/21 0525 04/25/21 0757  BP: (!) 139/58 137/66 (!) 149/65 (!) 149/73  Pulse: 84 80 87 85  Resp: 16 17 17 17   Temp: 98.5 F (36.9 C) 98.4 F (36.9 C) 98.2 F (36.8 C) 98.5 F (36.9 C)  TempSrc: Oral  Oral Oral  SpO2: 96% 99% 100% 99%  Weight:      Height:        Intake/Output Summary (Last 24 hours) at 04/25/2021 0840 Last data filed at 04/24/2021 1919 Gross per 24 hour  Intake 320 ml  Output --  Net 320 ml   Filed Weights   04/20/21 0829  Weight: 77.1 kg     Data Reviewed:   CBC: Recent Labs  Lab 04/20/21 0829 04/21/21 0400  WBC 11.5* 10.4  NEUTROABS 8.9*  --   HGB 10.6* 9.7*  HCT 33.2* 30.2*  MCV 98.8 96.8  PLT 330 426   Basic Metabolic Panel: Recent Labs  Lab 04/21/21 0523 04/22/21 0341 04/23/21 0526 04/24/21 0508 04/25/21 0439  NA 127* 128* 130* 131* 131*  K 4.2 3.7 4.1 3.6 4.2  CL 99 103 100 105 107  CO2 19* 16* 18* 17* 15*  GLUCOSE 259* 141* 204* 58* 120*  BUN 36* 39* 36* 39* 43*  CREATININE 1.40* 1.09* 1.13* 1.33* 1.60*  CALCIUM 7.9* 7.8* 8.2* 8.0* 8.0*   GFR: Estimated Creatinine Clearance: 36.8 mL/min (A) (by C-G formula based on SCr of 1.6 mg/dL (H)). Liver Function Tests: Recent Labs  Lab 04/21/21 0400 04/22/21 0341 04/23/21 0526 04/24/21 0508 04/25/21 0439  AST 291* 189* 168* 156* 167*  ALT 112* 88* 80* 63* 62*  ALKPHOS 398* 362* 361* 357* 364*  BILITOT 12.2* 11.5* 12.3* 10.6* 9.3*  PROT 6.0* 5.7* 6.1* 5.7* 5.6*  ALBUMIN 2.0* 1.7* 1.8* 1.6* 1.7*   No results for input(s): LIPASE, AMYLASE in the last 168 hours. Recent Labs  Lab 04/24/21 0508  AMMONIA 54*   Coagulation Profile: Recent Labs  Lab 04/20/21 1106  INR 1.6*   Cardiac Enzymes: No results for input(s):  CKTOTAL, CKMB, CKMBINDEX, TROPONINI in the last 168 hours. BNP (last 3 results) No results for input(s): PROBNP in the last 8760 hours. HbA1C: No results for input(s): HGBA1C in the last 72 hours. CBG: Recent Labs  Lab 04/24/21 0748 04/24/21 1151 04/24/21 1649 04/24/21 2149 04/25/21 0759  GLUCAP 71 225* 204* 179* 94   Lipid Profile: No results for input(s): CHOL, HDL, LDLCALC, TRIG, CHOLHDL, LDLDIRECT in the last 72 hours. Thyroid Function Tests: No results for input(s): TSH, T4TOTAL, FREET4, T3FREE, THYROIDAB in the last 72 hours. Anemia Panel: No results for input(s): VITAMINB12, FOLATE, FERRITIN, TIBC, IRON, RETICCTPCT in the last 72 hours. Sepsis Labs: Recent Labs  Lab 04/20/21 0829 04/20/21 1106 04/20/21 2003 04/22/21 0341  PROCALCITON  --  5.94  --  5.12  LATICACIDVEN 3.6* 2.6* 3.3*  --     Recent Results (from the past 240 hour(s))  Resp Panel by RT-PCR (Flu A&B, Covid) Nasopharyngeal Swab     Status: None   Collection Time: 04/20/21  8:29 AM   Specimen: Nasopharyngeal Swab; Nasopharyngeal(NP) swabs in vial transport medium  Result Value Ref Range Status   SARS Coronavirus 2 by RT PCR NEGATIVE  NEGATIVE Final    Comment: (NOTE) SARS-CoV-2 target nucleic acids are NOT DETECTED.  The SARS-CoV-2 RNA is generally detectable in upper respiratory specimens during the acute phase of infection. The lowest concentration of SARS-CoV-2 viral copies this assay can detect is 138 copies/mL. A negative result does not preclude SARS-Cov-2 infection and should not be used as the sole basis for treatment or other patient management decisions. A negative result may occur with  improper specimen collection/handling, submission of specimen other than nasopharyngeal swab, presence of viral mutation(s) within the areas targeted by this assay, and inadequate number of viral copies(<138 copies/mL). A negative result must be combined with clinical observations, patient history, and  epidemiological information. The expected result is Negative.  Fact Sheet for Patients:  EntrepreneurPulse.com.au  Fact Sheet for Healthcare Providers:  IncredibleEmployment.be  This test is no t yet approved or cleared by the [place] FDA and  has been authorized for detection and/or diagnosis of SARS-CoV-2 by FDA under an Emergency Use Authorization (EUA). This EUA will remain  in effect (meaning this test can be used) for the duration of the COVID-19 declaration under Section 564(b)(1) of the Act, 21 U.S.C.section 360bbb-3(b)(1), unless the authorization is terminated  or revoked sooner.       Influenza A by PCR NEGATIVE NEGATIVE Final   Influenza B by PCR NEGATIVE NEGATIVE Final    Comment: (NOTE) The Xpert Xpress SARS-CoV-2/FLU/RSV plus assay is intended as an aid in the diagnosis of influenza from Nasopharyngeal swab specimens and should not be used as a sole basis for treatment. Nasal washings and aspirates are unacceptable for Xpert Xpress SARS-CoV-2/FLU/RSV testing.  Fact Sheet for Patients: EntrepreneurPulse.com.au  Fact Sheet for Healthcare Providers: IncredibleEmployment.be  This test is not yet approved or cleared by the [place] FDA and has been authorized for detection and/or diagnosis of SARS-CoV-2 by FDA under an Emergency Use Authorization (EUA). This EUA will remain in effect (meaning this test can be used) for the duration of the COVID-19 declaration under Section 564(b)(1) of the Act, 21 U.S.C. section 360bbb-3(b)(1), unless the authorization is terminated or revoked.  Performed at [facility], [address] [place], [place] [address]   Culture, blood (Routine x 2)     Status: None (Preliminary result)   Collection Time: 04/20/21  8:34 AM   Specimen: BLOOD  Result Value Ref Range Status   Specimen Description BLOOD RH  Final   Special Requests   Final     BOTTLES DRAWN AEROBIC AND ANAEROBIC Blood Culture results may not be optimal due to an inadequate volume of blood received in culture bottles   Culture   Final    NO GROWTH 4 DAYS Performed at [facility], [address] [place], [place] [address]    Report Status PENDING  Incomplete  Culture, blood (Routine x 2)     Status: None (Preliminary result)   Collection Time: 04/20/21  8:39 AM   Specimen: BLOOD  Result Value Ref Range Status   Specimen Description BLOOD RH  Final   Special Requests   Final    BOTTLES DRAWN AEROBIC AND ANAEROBIC Blood Culture results may not be optimal due to an inadequate volume of blood received in culture bottles   Culture   Final    NO GROWTH 4 DAYS Performed at [facility], [address], [place], [place] [address]    Report Status PENDING  Incomplete  Urine Culture     Status: Abnormal   Collection  Time: 04/21/21  3:45 AM   Specimen: Urine, Random  Result Value Ref Range Status   Specimen Description   Final    URINE, RANDOM Performed at [facility], [address] [place], [place] [address]    Special Requests   Final    NONE Performed at [facility], [place], [place] [address]    Culture 50,000 COLONIES/mL STAPHYLOCOCCUS AUREUS (A)  Final   Report Status 04/23/2021 FINAL  Final   Organism ID, Bacteria STAPHYLOCOCCUS AUREUS (A)  Final      Susceptibility   Staphylococcus aureus - MIC*    CIPROFLOXACIN <=0.5 SENSITIVE Sensitive     GENTAMICIN <=0.5 SENSITIVE Sensitive     NITROFURANTOIN <=16 SENSITIVE Sensitive     OXACILLIN <=0.25 SENSITIVE Sensitive     TETRACYCLINE <=1 SENSITIVE Sensitive     VANCOMYCIN <=0.5 SENSITIVE Sensitive     TRIMETH/SULFA <=10 SENSITIVE Sensitive     CLINDAMYCIN <=0.25 SENSITIVE Sensitive     RIFAMPIN <=0.5 SENSITIVE Sensitive     Inducible Clindamycin NEGATIVE Sensitive     * 50,000 COLONIES/mL STAPHYLOCOCCUS AUREUS  Surgical pcr screen     Status:  Abnormal   Collection Time: 04/21/21  7:50 AM   Specimen: Nasal Mucosa; Nasal Swab  Result Value Ref Range Status   MRSA, PCR NEGATIVE NEGATIVE Final   Staphylococcus aureus POSITIVE (A) NEGATIVE Final    Comment: (NOTE) The Xpert SA Assay (FDA approved for NASAL specimens in patients 47 years of age and older), is one component of a comprehensive surveillance program. It is not intended to diagnose infection nor to guide or monitor treatment. Performed at [facility], [address] [place], [place] [address]          Radiology Studies: No results found.      Scheduled Meds:  calcium-vitamin D   Oral QPM   Chlorhexidine Gluconate Cloth  6 each Topical Daily   cholecalciferol  1,000 Units Oral Daily   clopidogrel  75 mg Oral Daily   feeding supplement  237 mL Oral TID BM   furosemide  20 mg Oral Daily   heparin  5,000 Units Subcutaneous Q8H   insulin aspart  0-15 Units Subcutaneous TID WC   insulin aspart  0-5 Units Subcutaneous QHS   lactulose  20 g Oral BID   midodrine  5 mg Oral TID WC   multivitamin with minerals  1 tablet Oral Daily   mupirocin ointment  1 application Nasal BID   pantoprazole  20 mg Oral Daily   spironolactone  50 mg Oral Daily   Continuous Infusions:  albumin human       LOS: 5 days   Time spent= 35 mins    Karel Mowers Arsenio Loader, MD [facility]  If 7PM-7AM, please contact night-coverage  04/25/2021, 8:40 AM

## 2021-04-25 NOTE — Progress Notes (Signed)
Hematology/Oncology Progress note [phone] [phone]     Patient Care Team: Baxter Hire, MD as PCP - General (Internal Medicine) Leonie Man, MD as PCP - Cardiology (Cardiology) Earlie Server, MD as Consulting Physician (Oncology) Herbert Pun, MD as Consulting Physician (General Surgery) Dillingham, Loel Lofty, DO as Attending Physician (Plastic Surgery) Rico Junker, RN as Registered Nurse Theodore Demark, RN as Registered Nurse   Name of the patient: Tammy Elliott  [id number]  13-Nov-1950  Date of visit: 04/25/21   INTERVAL HISTORY-  She feels better today. Started on lactulose.  Seen by nephro yesterday. Started on midodrine, albumin 25g twice daily.  She reports not able to sit up due to the discomfort from distended abdomen  Allergies  Allergen Reactions   Penicillins Hives and Swelling   Exenatide Nausea Only   Orlistat Diarrhea   Thiazide-Type Diuretics     Unknown reaction    Patient Active Problem List   Diagnosis Date Noted   Encephalopathy, hepatic    Palliative care encounter    Other ascites    Coagulopathy ([place])    Hyperbilirubinemia    SIRS (systemic inflammatory response syndrome) ([place]) 04/20/2021   Abnormal LFTs 04/20/2021   Hyponatremia 04/20/2021   AKI (acute kidney injury) ([place]) 04/20/2021   Jaundice    Goals of care, counseling/discussion 04/16/2021   Metastatic breast cancer ([place]) 04/16/2021   Transaminitis    Liver metastasis ([place]) 03/31/2021   Family history of uterine cancer 11/01/2020   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   Basal cell carcinoma 03/28/2020   Diabetes mellitus type 2 with complications ([place]) 52/10/221   Pelvic pain in female 03/28/2020   Abdominal pain 03/28/2020   S/P mastectomy, left 05/11/2019   Malignant neoplasm of upper-outer quadrant of left breast in female, estrogen receptor positive ([place]) 03/23/2019   CAD S/P percutaneous coronary angioplasty  05/04/2018   GERD (gastroesophageal reflux disease) 08/22/2014   CAD (coronary artery disease) 10/20/2013   Hyperlipemia 10/20/2013     Past Medical History:  Diagnosis Date   Aortic atherosclerosis (HCC)    CAD (coronary artery disease) 04/12/2010   a.) PCI --> 85% pLAD --> 2.5 x 15 mm Xience V DES x 1   Chronic anticoagulation    Rivaroxaban   Chronic ischemic heart disease    DOE (dyspnea on exertion)    Family history of uterine cancer    GERD (gastroesophageal reflux disease)    Hypercholesterolemia    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   a.) Stage IA (cT1b, cN0, cM0, G2, ER+, PR+, HER2-); b.) adjuvant endocrine therapy started 06/2019; c.) s/p total mastectomy, SNLB, and breast reconstruction in 04/2019   Liver cancer ([place])    Skin cancer, basal cell    s/p excision   Superior mesenteric artery stenosis (HCC)    T2DM (type 2 diabetes mellitus) ([place])      Past Surgical History:  Procedure Laterality Date   BREAST BIOPSY Left 03/11/2019   Stereotactic CNB; pathology --> ER/PR (-), HER2/neu (-) IMC iwth lobular features   BREAST RECONSTRUCTION WITH PLACEMENT OF TISSUE EXPANDER AND FLEX HD (ACELLULAR HYDRATED DERMIS) Left 05/03/2019   Procedure: LEFT BREAST RECONSTRUCTION WITH PLACEMENT OF TISSUE EXPANDER AND FLEX HD (ACELLULAR HYDRATED DERMIS);  Surgeon: Wallace Going, DO;  Location: [facility];  Service: Plastics;  Laterality: Left;   CHOLECYSTECTOMY     COLONOSCOPY     CORONARY ANGIOPLASTY WITH STENT PLACEMENT Left 04/12/2010  Procedure: LHC with placement of 2.5 x 15 mm Xience V DES x 1 to pLAD; Location: [facility]; Surgeon: Isaias Cowman, MD   EXCISION OF BREAST BIOPSY Left 11/17/2020   Procedure: EXCISION OF BREAST BIOPSY;  Surgeon: Herbert Pun, MD;  Location: [facility];  Service: General;  Laterality: Left;   LIPOSUCTION WITH LIPOFILLING Bilateral 01/27/2020   Procedure: LIPOSUCTION WITH LIPOFILLING;  Surgeon:  Wallace Going, DO;  Location: [facility];  Service: Plastics;  Laterality: Bilateral;  90 min   MASTOPEXY Right 07/01/2019   Procedure: MASTOPEXY;  Surgeon: Wallace Going, DO;  Location: [facility];  Service: Plastics;  Laterality: Right;   REMOVAL OF TISSUE EXPANDER AND PLACEMENT OF IMPLANT Left 07/01/2019   Procedure: REMOVAL OF TISSUE EXPANDER AND PLACEMENT OF IMPLANT;  Surgeon: Wallace Going, DO;  Location: [facility];  Service: Plastics;  Laterality: Left;  total case 2.5 hours   TOTAL MASTECTOMY Left 05/03/2019   Procedure: TOTAL MASTECTOMY W/ Sentinel Node;  Surgeon: Herbert Pun, MD;  Location: [facility];  Service: General;  Laterality: Left;   VISCERAL ANGIOGRAPHY N/A 05/01/2020   Procedure: VISCERAL ANGIOGRAPHY;  Surgeon: Algernon Huxley, MD;  Location: [facility];  Service: Cardiovascular;  Laterality: N/A;    Social History   Socioeconomic History   Marital status: Married    Spouse name: Not on file   Number of children: Not on file   Years of education: Not on file   Highest education level: Not on file  Occupational History   Occupation: Analyst    Employer: [employer]  Tobacco Use   Smoking status: Never   Smokeless tobacco: Never  Vaping Use   Vaping Use: Never used  Substance and Sexual Activity   Alcohol use: Not Currently    Comment: rarely   Drug use: Never   Sexual activity: Not on file  Other Topics Concern   Not on file  Social History Narrative   reports that she has never smoked. She has never used smokeless tobacco. She reports that she rarely drinks alcohol. She reports that she does not use drugs.      Married to [name] ([name]) [name]    Social Determinants of Health   Financial Resource Strain: Not on file  Food Insecurity: Not on file  Transportation Needs: Not on file  Physical Activity: Not on file  Stress: Not on file  Social Connections: Not on file  Intimate  Partner Violence: Not on file     Family History  Problem Relation Age of Onset   CAD Mother    Diabetes Mellitus II Mother    Hypertension Mother    Uterine cancer Mother        dx late 93s   CAD Father    Heart attack Father    Hypertension Father    Diabetes Mellitus II Father        Took insulin   Alcohol abuse Father    Diabetes Mellitus II Sister    Cancer Sister        possible cervical or uterine   Lung cancer Paternal Aunt    Cancer Paternal Uncle        unk type   Diabetes Other        Almost all sisters are diabetic or pre-diabetic    Cancer Cousin        possible colon/stomach/something in abdomen   Breast cancer Neg Hx      Current  Facility-Administered Medications:    albumin human 25 % solution 12.5 g, 12.5 g, Intravenous, BID, Singh, Harmeet, MD, Last Rate: 60 mL/hr at 04/25/21 1210, 12.5 g at 04/25/21 1210   calcium-vitamin D (OSCAL WITH D) 500-5 MG-MCG per tablet, , Oral, QPM, Ivor Costa, MD, 1 tablet at 04/24/21 1746   cholecalciferol (VITAMIN D3) tablet 1,000 Units, 1,000 Units, Oral, Daily, Ivor Costa, MD, 1,000 Units at 04/25/21 2025   clopidogrel (PLAVIX) tablet 75 mg, 75 mg, Oral, Daily, Ivor Costa, MD, 75 mg at 04/25/21 4270   feeding supplement (ENSURE ENLIVE / ENSURE PLUS) liquid 237 mL, 237 mL, Oral, TID BM, Sreenath, Sudheer B, MD   furosemide (LASIX) tablet 20 mg, 20 mg, Oral, Daily, Earlie Server, MD, 20 mg at 04/25/21 0942   guaiFENesin (ROBITUSSIN) 100 MG/5ML liquid 5 mL, 5 mL, Oral, Q4H PRN, Amin, Ankit Chirag, MD   heparin injection 5,000 Units, 5,000 Units, Subcutaneous, Q8H, Ivor Costa, MD, 5,000 Units at 04/25/21 0525   hydrALAZINE (APRESOLINE) injection 10 mg, 10 mg, Intravenous, Q4H PRN, Amin, Ankit Chirag, MD   HYDROmorphone (DILAUDID) injection 0.5 mg, 0.5 mg, Intravenous, Q3H PRN, Sreenath, Sudheer B, MD   insulin aspart (novoLOG) injection 0-15 Units, 0-15 Units, Subcutaneous, TID WC, Sreenath, Sudheer B, MD, 3 Units at 04/25/21 1155    insulin aspart (novoLOG) injection 0-5 Units, 0-5 Units, Subcutaneous, QHS, Ivor Costa, MD, 3 Units at 04/20/21 2349   ipratropium-albuterol (DUONEB) 0.5-2.5 (3) MG/3ML nebulizer solution 3 mL, 3 mL, Nebulization, Q4H PRN, Amin, Ankit Chirag, MD   lactulose ([place]) 10 GM/15ML solution 20 g, 20 g, Oral, BID, Earlie Server, MD, 20 g at 04/25/21 0942   metoprolol tartrate (LOPRESSOR) injection 5 mg, 5 mg, Intravenous, Q4H PRN, Amin, Ankit Chirag, MD   midodrine (PROAMATINE) tablet 5 mg, 5 mg, Oral, TID WC, Singh, Harmeet, MD, 5 mg at 04/25/21 1155   morphine CONCENTRATE 10 MG/0.5ML oral solution 10 mg, 10 mg, Oral, Q6H PRN, Ivor Costa, MD, 10 mg at 04/24/21 2142   multivitamin with minerals tablet 1 tablet, 1 tablet, Oral, Daily, Ivor Costa, MD, 1 tablet at 04/25/21 0942   mupirocin ointment (BACTROBAN) 2 % 1 application, 1 application, Nasal, BID, Sreenath, Sudheer B, MD, 1 application at 62/37/62 1155   ondansetron (ZOFRAN) injection 4 mg, 4 mg, Intravenous, Q8H PRN, Ivor Costa, MD, 4 mg at 04/22/21 2018   pantoprazole (PROTONIX) EC tablet 20 mg, 20 mg, Oral, Daily, Ivor Costa, MD, 20 mg at 04/25/21 0942   pneumococcal 23 valent vaccine (PNEUMOVAX-23) injection 0.5 mL, 0.5 mL, Intramuscular, Prior to discharge, Ivor Costa, MD   senna-docusate (Senokot-S) tablet 1 tablet, 1 tablet, Oral, QHS PRN, Amin, Ankit Chirag, MD   spironolactone (ALDACTONE) tablet 50 mg, 50 mg, Oral, Daily, Earlie Server, MD, 50 mg at 04/25/21 0942   Physical exam:  Vitals:   04/24/21 2340 04/25/21 0525 04/25/21 0757 04/25/21 1402  BP: 137/66 (!) 149/65 (!) 149/73 (!) 141/54  Pulse: 80 87 85 84  Resp: _0 Temp: 98.4 F (36.9 C) 98.2 F (36.8 C) 98.5 F (36.9 C)   TempSrc:  Oral Oral   SpO2: 99% 100% 99% 98%  Weight:      Height:       Physical Exam Constitutional:      General: She is not in acute distress.    Appearance: She is not diaphoretic.  HENT:     Head: Normocephalic and atraumatic.      Mouth/Throat:  Pharynx: No oropharyngeal exudate.  Eyes:     General: No scleral icterus. Cardiovascular:     Rate and Rhythm: Normal rate.     Heart sounds: No murmur heard. Pulmonary:     Effort: Pulmonary effort is normal. No respiratory distress.  Abdominal:     General: There is distension.     Palpations: Abdomen is soft.     Tenderness: There is no abdominal tenderness.  Musculoskeletal:        General: Swelling present. Normal range of motion.     Cervical back: Normal range of motion and neck supple.  Skin:    General: Skin is warm and dry.     Coloration: Skin is jaundiced.  Neurological:     Mental Status: She is alert and oriented to person, place, and time.     Cranial Nerves: No cranial nerve deficit.     Motor: No abnormal muscle tone.  Psychiatric:        Mood and Affect: Affect normal.       CMP Latest Ref Rng & Units 04/25/2021  Glucose 70 - 99 mg/dL 120(H)  BUN 8 - 23 mg/dL 43(H)  Creatinine 0.44 - 1.00 mg/dL 1.60(H)  Sodium 135 - 145 mmol/L 131(L)  Potassium 3.5 - 5.1 mmol/L 4.2  Chloride 98 - 111 mmol/L 107  CO2 22 - 32 mmol/L 15(L)  Calcium 8.9 - 10.3 mg/dL 8.0(L)  Total Protein 6.5 - 8.1 g/dL 5.6(L)  Total Bilirubin 0.3 - 1.2 mg/dL 9.3(H)  Alkaline Phos 38 - 126 U/L 364(H)  AST 15 - 41 U/L 167(H)  ALT 0 - 44 U/L 62(H)   CBC Latest Ref Rng & Units 04/21/2021  WBC 4.0 - 10.5 K/uL 10.4  Hemoglobin 12.0 - 15.0 g/dL 9.7(L)  Hematocrit 36.0 - 46.0 % 30.2(L)  Platelets 150 - 400 K/uL 270    RADIOGRAPHIC STUDIES: I have personally reviewed the radiological images as listed and agreed with the findings in the report. DG Chest 2 View  Result Date: 03/31/2021 CLINICAL DATA:  Shortness of breath. EXAM: CHEST - 2 VIEW COMPARISON:  None. FINDINGS: Heart size and mediastinal contours are normal. No pleural effusion or edema identified. No airspace opacities. Visualized osseous structures are unremarkable. IMPRESSION: No active cardiopulmonary  abnormalities. Electronically Signed   By: Kerby Moors M.D.   On: 03/31/2021 14:04   CT CHEST W CONTRAST  Result Date: 04/01/2021 CLINICAL DATA:  Evaluate for malignancy. Newly discovered liver metastases. EXAM: CT CHEST WITH CONTRAST TECHNIQUE: Multidetector CT imaging of the chest was performed during intravenous contrast administration. RADIATION DOSE REDUCTION: This exam was performed according to the departmental dose-optimization program which includes automated exposure control, adjustment of the mA and/or kV according to patient size and/or use of iterative reconstruction technique. CONTRAST:  161m OMNIPAQUE IOHEXOL 350 MG/ML SOLN COMPARISON:  CT AP 03/31/2021. FINDINGS: Cardiovascular: No significant vascular findings. Normal heart size. No pericardial effusion. Aortic atherosclerosis and coronary artery calcifications. Mediastinum/Nodes: No enlarged mediastinal, hilar, or axillary lymph nodes. Thyroid gland, trachea, and esophagus demonstrate no significant findings. Lungs/Pleura: Scar like density identified within the superior segment of right lower lobe. No pleural effusion, airspace consolidation, or pneumothorax. No suspicious pulmonary nodule or mass identified. Dependent changes are noted within the posterior lung bases. Upper Abdomen: Multifocal liver metastases are again identified involving both lobes of liver. No acute abnormality noted within the imaged portions of the upper abdomen. Musculoskeletal: Status post left mastectomy with implant reconstruction. There is mild skin thickening with subcutaneous  soft tissue stranding within the periphery of the reconstructed left breast, image 90/3. IMPRESSION: 1. No evidence for primary lung neoplasm or metastatic disease to the chest. 2. Status post left mastectomy with implant reconstruction. There is mild skin thickening with subcutaneous soft tissue stranding within the periphery of the reconstructed left breast. This is nonspecific and may  be related to postsurgical change. 3. Multifocal liver metastases. 4. Coronary artery calcifications. 5. Aortic Atherosclerosis (ICD10-I70.0). Electronically Signed   By: Kerby Moors M.D.   On: 04/01/2021 11:42   CT ABDOMEN PELVIS W CONTRAST  Result Date: 03/31/2021 CLINICAL DATA:  Abdominal pain. EXAM: CT ABDOMEN AND PELVIS WITH CONTRAST TECHNIQUE: Multidetector CT imaging of the abdomen and pelvis was performed using the standard protocol following bolus administration of intravenous contrast. RADIATION DOSE REDUCTION: This exam was performed according to the departmental dose-optimization program which includes automated exposure control, adjustment of the mA and/or kV according to patient size and/or use of iterative reconstruction technique. CONTRAST:  61m OMNIPAQUE IOHEXOL 300 MG/ML  SOLN COMPARISON:  03/02/2020 FINDINGS: Lower chest: Visualized lung bases are clear. Hepatobiliary: Extensive ill-defined low-attenuation lesions are identified throughout both lobes of compatible with metastatic disease. This is a new finding when compared with 03/02/2020. Index lesion within right lobe of liver measures 2.8 by 2.1 cm, image 16/3. Index lesion within segment 4 measures 2.4 x 2.0 cm, image 27/3. Index lesion within segment 2/3 measures 2.4 x 2.3 cm, image 25/3. Status post cholecystectomy. No bile duct dilatation. Pancreas: Unremarkable. No pancreatic ductal dilatation or surrounding inflammatory changes. Spleen: Normal in size without focal abnormality. Adrenals/Urinary Tract: Normal adrenal glands. No kidney mass or hydronephrosis identified. No nephrolithiasis. Urinary bladder is unremarkable. Stomach/Bowel: Stomach appears within normal limits. No small bowel wall thickening, inflammation, or distension. No pathologic dilatation of the colon. No obstructing colonic mass identified. Vascular/Lymphatic: Aortic atherosclerosis. No aneurysm. No abdominopelvic adenopathy. Reproductive: Small exophytic  calcified fibroid arises off the anterior uterus. Uterus is otherwise unremarkable. No adnexal mass. Other: No free fluid or fluid collections. No aggressive lytic or sclerotic bone lesions. Musculoskeletal: No acute or significant osseous findings. IMPRESSION: 1. No acute findings identified within the abdomen or pelvis. 2. Extensive ill-defined low-attenuation lesions throughout both lobes of liver compatible with metastatic disease. This is a new finding when compared with previous CT of the abdomen pelvis from 03/02/2020. No primary neoplasm identified within the abdomen or pelvis. 3. Aortic Atherosclerosis (ICD10-I70.0). Electronically Signed   By: TKerby MoorsM.D.   On: 03/31/2021 14:39   MR 3D Recon At Scanner  Result Date: 04/20/2021 CLINICAL DATA:  Jaundice.  Fever.  Metastatic breast carcinoma. EXAM: MRI ABDOMEN WITHOUT AND WITH CONTRAST (INCLUDING MRCP) TECHNIQUE: Multiplanar multisequence MR imaging of the abdomen was performed both before and after the administration of intravenous contrast. Heavily T2-weighted images of the biliary and pancreatic ducts were obtained, and three-dimensional MRCP images were rendered by post processing. CONTRAST:  7.547mGADAVIST GADOBUTROL 1 MMOL/ML IV SOLN COMPARISON:  CT on 03/31/2021 FINDINGS: Lower chest: Tiny bilateral pleural effusions. Infiltrate or atelectasis in the posterior lung bases, right side greater than left. Hepatobiliary: Image degradation by motion artifact noted. Innumerable hypovascular masses are seen involving the liver diffusely, consistent with diffuse liver metastases. Largest index lesion located in segment 8 measures 5.1 x 4.2 cm on image 31/23. Comparison with prior CT is limited by differences in modality, without definite change when allowing for technical differences. Prior cholecystectomy. No evidence of biliary obstruction. Pancreas:  No mass or  inflammatory changes. Spleen:  Within normal limits in size and appearance.  Adrenals/Urinary Tract: No masses identified. No evidence of hydronephrosis. Stomach/Bowel: Unremarkable. Vascular/Lymphatic: No pathologically enlarged lymph nodes identified. No acute vascular findings. Other: Mild ascites and diffuse mesenteric and body wall edema noted. Musculoskeletal:  No suspicious bone lesions identified. IMPRESSION: Diffuse liver metastases, similar to recent CT. New mild ascites and anasarca. Tiny bilateral pleural effusions, with atelectasis or infiltrate in both lung bases. Prior cholecystectomy.  No evidence of biliary ductal dilatation. Electronically Signed   By: Marlaine Hind M.D.   On: 04/20/2021 14:42   US BIOPSY (LIVER)  Result Date: 04/09/2021 INDICATION: Liver lesions, concern for metastatic breast cancer EXAM: Ultrasound-guided core needle biopsy of liver lesion MEDICATIONS: None. ANESTHESIA/SEDATION: Moderate (conscious) sedation was employed during this procedure. A total of Versed 2 mg and Fentanyl 100 mcg was administered intravenously. Moderate Sedation Time: 12 minutes. The patient's level of consciousness and vital signs were monitored continuously by radiology nursing throughout the procedure under my direct supervision. FLUOROSCOPY TIME:  N/a COMPLICATIONS: None immediate. PROCEDURE: Informed written consent was obtained from the patient after a thorough discussion of the procedural risks, benefits and alternatives. All questions were addressed. Maximal Sterile Barrier Technique was utilized including caps, mask, sterile gowns, sterile gloves, sterile drape, hand hygiene and skin antiseptic. A timeout was performed prior to the initiation of the procedure. The patient was placed supine on the exam table. Limited ultrasound of the liver was performed, demonstrating numerous lesions within the right and left hepatic lobes. It appropriate lesion was identified in the left hepatic lobe amenable for percutaneous biopsy. Skin entry site was marked, the overlying skin was  prepped and draped in the standard sterile fashion. Local analgesia was obtained with 1% lidocaine. Using ultrasound guidance, a 17 gauge introducer needle was advanced towards the target lesion in the left hepatic lobe. Subsequently, core needle biopsy was performed using a 18 gauge core biopsy device x3 passes. Adequate tissue samples were obtained, and submitted in formalin to pathology for further handling. Limited postprocedure imaging demonstrated no hematoma or complicating feature. A clean dressing was placed after manual hemostasis. The patient tolerated the procedure well without immediate complication. IMPRESSION: Successful ultrasound-guided core needle biopsy of focal lesion in the left hepatic lobe. Electronically Signed   By: Albin Felling M.D.   On: 04/09/2021 11:07   DG Chest Port 1 View  Result Date: 04/20/2021 CLINICAL DATA:  Fever. EXAM: PORTABLE CHEST 1 VIEW COMPARISON:  March 31, 2021. FINDINGS: The heart size and mediastinal contours are within normal limits. Hypoinflation of the lungs is noted with minimal bibasilar subsegmental atelectasis. The visualized skeletal structures are unremarkable. IMPRESSION: Hypoinflation of the lungs with minimal bibasilar subsegmental atelectasis. Electronically Signed   By: Marijo Conception M.D.   On: 04/20/2021 08:49   MR ABDOMEN MRCP W WO CONTAST  Result Date: 04/20/2021 CLINICAL DATA:  Jaundice.  Fever.  Metastatic breast carcinoma. EXAM: MRI ABDOMEN WITHOUT AND WITH CONTRAST (INCLUDING MRCP) TECHNIQUE: Multiplanar multisequence MR imaging of the abdomen was performed both before and after the administration of intravenous contrast. Heavily T2-weighted images of the biliary and pancreatic ducts were obtained, and three-dimensional MRCP images were rendered by post processing. CONTRAST:  7.61m GADAVIST GADOBUTROL 1 MMOL/ML IV SOLN COMPARISON:  CT on 03/31/2021 FINDINGS: Lower chest: Tiny bilateral pleural effusions. Infiltrate or atelectasis in  the posterior lung bases, right side greater than left. Hepatobiliary: Image degradation by motion artifact noted. Innumerable hypovascular masses are seen involving  the liver diffusely, consistent with diffuse liver metastases. Largest index lesion located in segment 8 measures 5.1 x 4.2 cm on image 31/23. Comparison with prior CT is limited by differences in modality, without definite change when allowing for technical differences. Prior cholecystectomy. No evidence of biliary obstruction. Pancreas:  No mass or inflammatory changes. Spleen:  Within normal limits in size and appearance. Adrenals/Urinary Tract: No masses identified. No evidence of hydronephrosis. Stomach/Bowel: Unremarkable. Vascular/Lymphatic: No pathologically enlarged lymph nodes identified. No acute vascular findings. Other: Mild ascites and diffuse mesenteric and body wall edema noted. Musculoskeletal:  No suspicious bone lesions identified. IMPRESSION: Diffuse liver metastases, similar to recent CT. New mild ascites and anasarca. Tiny bilateral pleural effusions, with atelectasis or infiltrate in both lung bases. Prior cholecystectomy.  No evidence of biliary ductal dilatation. Electronically Signed   By: Marlaine Hind M.D.   On: 04/20/2021 14:42   US Abdomen Limited RUQ (LIVER/GB)  Result Date: 04/17/2021 CLINICAL DATA:  Hyperbilirubinemia. EXAM: ULTRASOUND ABDOMEN LIMITED RIGHT UPPER QUADRANT COMPARISON:  March 31, 2021. FINDINGS: Gallbladder: Status post cholecystectomy. Common bile duct: Diameter: 8 mm which is within normal limits for post cholecystectomy status. Liver: Multiple hypoechoic abnormalities are noted throughout hepatic parenchyma consistent with metastatic disease. Portal vein is patent on color Doppler imaging with normal direction of blood flow towards the liver. Other: None. IMPRESSION: Status post cholecystectomy. Diffuse hepatic metastatic disease is noted. Electronically Signed   By: Marijo Conception M.D.   On:  04/17/2021 09:36    Assessment and plan-   #Metastatic breast cancer with extensive liver involvement, hyperbilirubinemia/impaired liver function, coagulopathy Status post fulvestrant 500 mg x 1 on 04/23/21 Bilirubin level continues to trend down, continue to monitor.  Goals of care was reviewed with patient.  If she desires treatment and her condition is stable tentatively plan outpatient gemcitabine next week.  #Abdomen distention due to ascites, generalized anasarca  Continue lasix 69m daily and Spirolactone 517mdaily.  Recommend palliative paracentesis for symptom control.    # Hepatic encephalopathy, ammonia level is elevated at 50, continue  lactulose 2057mID. Trend ammonia level tmr  # AKI, kidney function is elevated. Avoid nephrotoxin. 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 Nephrology recommendation reviewed.    #UTI. Urine cultures positive for Staph aureus, unclear significance.  Blood cultures negative. Finished 5 days of antibiotics.    Thank you for allowing me to participate in the care of this patient.   ZhoEarlie ServerD, PhD Hematology Oncology  04/25/2021

## 2021-04-26 ENCOUNTER — Other Ambulatory Visit: Payer: Self-pay | Admitting: Oncology

## 2021-04-26 DIAGNOSIS — C50919 Malignant neoplasm of unspecified site of unspecified female breast: Secondary | ICD-10-CM | POA: Diagnosis not present

## 2021-04-26 DIAGNOSIS — R17 Unspecified jaundice: Secondary | ICD-10-CM | POA: Diagnosis not present

## 2021-04-26 DIAGNOSIS — Z7189 Other specified counseling: Secondary | ICD-10-CM

## 2021-04-26 DIAGNOSIS — C787 Secondary malignant neoplasm of liver and intrahepatic bile duct: Secondary | ICD-10-CM | POA: Diagnosis not present

## 2021-04-26 DIAGNOSIS — R6510 Systemic inflammatory response syndrome (SIRS) of non-infectious origin without acute organ dysfunction: Secondary | ICD-10-CM | POA: Diagnosis not present

## 2021-04-26 LAB — GLUCOSE, CAPILLARY
Glucose-Capillary: 146 mg/dL — ABNORMAL HIGH (ref 70–99)
Glucose-Capillary: 172 mg/dL — ABNORMAL HIGH (ref 70–99)

## 2021-04-26 LAB — CBC
HCT: 30.5 % — ABNORMAL LOW (ref 36.0–46.0)
Hemoglobin: 10 g/dL — ABNORMAL LOW (ref 12.0–15.0)
MCH: 30.5 pg (ref 26.0–34.0)
MCHC: 32.8 g/dL (ref 30.0–36.0)
MCV: 93 fL (ref 80.0–100.0)
Platelets: 285 10*3/uL (ref 150–400)
RBC: 3.28 MIL/uL — ABNORMAL LOW (ref 3.87–5.11)
RDW: 17.3 % — ABNORMAL HIGH (ref 11.5–15.5)
WBC: 9.3 10*3/uL (ref 4.0–10.5)
nRBC: 0 % (ref 0.0–0.2)

## 2021-04-26 LAB — COMPREHENSIVE METABOLIC PANEL
ALT: 59 U/L — ABNORMAL HIGH (ref 0–44)
AST: 174 U/L — ABNORMAL HIGH (ref 15–41)
Albumin: 1.9 g/dL — ABNORMAL LOW (ref 3.5–5.0)
Alkaline Phosphatase: 350 U/L — ABNORMAL HIGH (ref 38–126)
Anion gap: 7 (ref 5–15)
BUN: 44 mg/dL — ABNORMAL HIGH (ref 8–23)
CO2: 18 mmol/L — ABNORMAL LOW (ref 22–32)
Calcium: 8.1 mg/dL — ABNORMAL LOW (ref 8.9–10.3)
Chloride: 106 mmol/L (ref 98–111)
Creatinine, Ser: 1.4 mg/dL — ABNORMAL HIGH (ref 0.44–1.00)
GFR, Estimated: 41 mL/min — ABNORMAL LOW (ref >60)
Glucose, Bld: 133 mg/dL — ABNORMAL HIGH (ref 70–99)
Potassium: 3.7 mmol/L (ref 3.5–5.1)
Sodium: 131 mmol/L — ABNORMAL LOW (ref 135–145)
Total Bilirubin: 8.9 mg/dL — ABNORMAL HIGH (ref 0.3–1.2)
Total Protein: 5.8 g/dL — ABNORMAL LOW (ref 6.5–8.1)

## 2021-04-26 LAB — PATHOLOGIST SMEAR REVIEW

## 2021-04-26 LAB — MAGNESIUM: Magnesium: 2.4 mg/dL (ref 1.7–2.4)

## 2021-04-26 LAB — PHOSPHORUS: Phosphorus: 4.3 mg/dL (ref 2.5–4.6)

## 2021-04-26 MED ORDER — FUROSEMIDE 20 MG PO TABS: 20.0000 mg | ORAL_TABLET | Freq: Every day | ORAL | Status: AC

## 2021-04-26 MED ORDER — MIDODRINE HCL 5 MG PO TABS: 5.0000 mg | ORAL_TABLET | Freq: Three times a day (TID) | ORAL | Status: AC

## 2021-04-26 MED ORDER — SPIRONOLACTONE 50 MG PO TABS: 50.0000 mg | ORAL_TABLET | Freq: Every day | ORAL | Status: AC

## 2021-04-26 MED ORDER — LACTULOSE 10 GM/15ML PO SOLN: 20.0000 g | Freq: Two times a day (BID) | ORAL | 0 refills | Status: AC

## 2021-04-26 MED ORDER — SENNOSIDES-DOCUSATE SODIUM 8.6-50 MG PO TABS: 1.0000 | ORAL_TABLET | Freq: Every evening | ORAL | Status: AC | PRN

## 2021-04-26 NOTE — Care Management Important Message (Signed)
Important Message  Patient Details  Name: Tammy Elliott MRN: [id number] Date of Birth: November 29, 1950   Medicare Important Message Given:  N/A - LOS <3 / Initial given by admissions  Discharging to hospice home.  Medicare IM withheld at this time out of respect for patient and family.    Dannette Barbara 04/26/2021, 12:52 PM

## 2021-04-26 NOTE — Plan of Care (Signed)

## 2021-04-26 NOTE — TOC Transition Note (Signed)
Transition of Care [facility]) - CM/SW Discharge Note   Patient Details  Name: Tammy Elliott MRN: [id number] Date of Birth: 19-Oct-1950  Transition of Care [facility]) CM/SW Contact:  Beverly Sessions, RN Phone Number: 04/26/2021, 11:49 AM   Clinical Narrative:     Patient to discharge to hospice home today  [facility] with [facility] has arranged transport for 4pm EMS packet and signed DNR on chart      Barriers to Discharge: Continued Medical Work up   Patient Goals and CMS Choice        Discharge Placement                       Discharge Plan and Services                DME Arranged: Walker rolling DME Agency: [employer] Date DME Agency Contacted: 04/22/21 Time DME Agency Contacted: [phone] Representative spoke with at DME Agency: [place] Interventions     Readmission Risk Interventions No flowsheet data found.

## 2021-04-26 NOTE — Progress Notes (Signed)
[facility])   Consent forms to be completed b/w 1:30p & 2:00p at [facility] by spouse/William.   EMS notified of patient D/C and transport arranged for 4p. TOC/Stephanie and Attending Physician/Dr. Jeanie Sewer also notified of transport arrangement.    Please send signed DNR form with patient and RN call report to [phone].    Daphene Calamity, MSW [facility] Liaison [phone]

## 2021-04-26 NOTE — Progress Notes (Signed)
Hematology/Oncology Progress note [phone] [phone]     Patient Care Team: Baxter Hire, MD as PCP - General (Internal Medicine) Leonie Man, MD as PCP - Cardiology (Cardiology) Earlie Server, MD as Consulting Physician (Oncology) Herbert Pun, MD as Consulting Physician (General Surgery) Dillingham, Loel Lofty, DO as Attending Physician (Plastic Surgery) Rico Junker, RN as Registered Nurse Theodore Demark, RN as Registered Nurse   Name of the patient: Tammy Elliott  [id number]  1950/06/22  Date of visit: 04/26/21   INTERVAL HISTORY-  Patient and husband decide to initiate hospice.   Allergies  Allergen Reactions   Penicillins Hives and Swelling   Exenatide Nausea Only   Orlistat Diarrhea   Thiazide-Type Diuretics     Unknown reaction    Patient Active Problem List   Diagnosis Date Noted   Encephalopathy, hepatic    Palliative care encounter    Other ascites    Coagulopathy ([place])    Hyperbilirubinemia    SIRS (systemic inflammatory response syndrome) (HCC) 04/20/2021   Abnormal LFTs 04/20/2021   Hyponatremia 04/20/2021   AKI (acute kidney injury) ([place]) 04/20/2021   Jaundice    Goals of care, counseling/discussion 04/16/2021   Metastatic breast cancer ([place]) 04/16/2021   Transaminitis    Liver metastasis ([place]) 03/31/2021   Family history of uterine cancer 11/01/2020   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   Basal cell carcinoma 03/28/2020   Diabetes mellitus type 2 with complications ([place]) 26/37/8588   Pelvic pain in female 03/28/2020   Abdominal pain 03/28/2020   S/P mastectomy, left 05/11/2019   Malignant neoplasm of upper-outer quadrant of left breast in female, estrogen receptor positive ([place]) 03/23/2019   CAD S/P percutaneous coronary angioplasty 05/04/2018   GERD (gastroesophageal reflux disease) 08/22/2014   CAD (coronary artery disease) 10/20/2013   Hyperlipemia 10/20/2013     Past Medical  History:  Diagnosis Date   Aortic atherosclerosis (HCC)    CAD (coronary artery disease) 04/12/2010   a.) PCI --> 85% pLAD --> 2.5 x 15 mm Xience V DES x 1   Chronic anticoagulation    Rivaroxaban   Chronic ischemic heart disease    DOE (dyspnea on exertion)    Family history of uterine cancer    GERD (gastroesophageal reflux disease)    Hypercholesterolemia    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)      Past Surgical History:  Procedure Laterality Date   BREAST BIOPSY Left 03/11/2019   Stereotactic CNB; pathology --> ER/PR (-), HER2/neu (-) IMC iwth lobular features   BREAST RECONSTRUCTION WITH PLACEMENT OF TISSUE EXPANDER AND FLEX HD (ACELLULAR HYDRATED DERMIS) Left 05/03/2019   Procedure: LEFT BREAST RECONSTRUCTION WITH PLACEMENT OF TISSUE EXPANDER AND FLEX HD (ACELLULAR HYDRATED DERMIS);  Surgeon: Wallace Going, DO;  Location: [facility];  Service: Plastics;  Laterality: Left;   CHOLECYSTECTOMY     COLONOSCOPY     CORONARY ANGIOPLASTY WITH STENT PLACEMENT Left 04/12/2010   Procedure: LHC with placement of 2.5 x 15 mm Xience V DES x 1 to pLAD; Location: [facility]; Surgeon: Isaias Cowman, MD   EXCISION OF  BREAST BIOPSY Left 11/17/2020   Procedure: EXCISION OF BREAST BIOPSY;  Surgeon: Herbert Pun, MD;  Location: [facility];  Service: General;  Laterality: Left;   LIPOSUCTION WITH LIPOFILLING Bilateral 01/27/2020   Procedure: LIPOSUCTION WITH LIPOFILLING;  Surgeon: Wallace Going, DO;  Location: [place];  Service: Plastics;  Laterality: Bilateral;  90 min   MASTOPEXY Right 07/01/2019   Procedure:  MASTOPEXY;  Surgeon: Wallace Going, DO;  Location: [place];  Service: Plastics;  Laterality: Right;   REMOVAL OF TISSUE EXPANDER AND PLACEMENT OF IMPLANT Left 07/01/2019   Procedure: REMOVAL OF TISSUE EXPANDER AND PLACEMENT OF IMPLANT;  Surgeon: Wallace Going, DO;  Location: [place];  Service: Plastics;  Laterality: Left;  total case 2.5 hours   TOTAL MASTECTOMY Left 05/03/2019   Procedure: TOTAL MASTECTOMY W/ Sentinel Node;  Surgeon: Herbert Pun, MD;  Location: [facility];  Service: General;  Laterality: Left;   VISCERAL ANGIOGRAPHY N/A 05/01/2020   Procedure: VISCERAL ANGIOGRAPHY;  Surgeon: Algernon Huxley, MD;  Location: [facility] [facility];  Service: Cardiovascular;  Laterality: N/A;    Social History   Socioeconomic History   Marital status: Married    Spouse name: Not on file   Number of children: Not on file   Years of education: Not on file   Highest education level: Not on file  Occupational History   Occupation: Analyst    Employer: [employer]  Tobacco Use   Smoking status: Never   Smokeless tobacco: Never  Vaping Use   Vaping Use: Never used  Substance and Sexual Activity   Alcohol use: Not Currently    Comment: rarely   Drug use: Never   Sexual activity: Not on file  Other Topics Concern   Not on file  Social History Narrative   reports that she has never smoked. She has never used smokeless tobacco. She reports that she rarely drinks alcohol. She reports that she does not use drugs.      Married to [employer] ([name]) [name]    Social Determinants of Health   Financial Resource Strain: Not on file  Food Insecurity: Not on file  Transportation Needs: Not on file  Physical Activity: Not on file  Stress: Not on file  Social Connections: Not on file  Intimate Partner Violence: Not on file     Family History  Problem Relation Age of Onset   CAD Mother    Diabetes Mellitus II Mother    Hypertension Mother     Uterine cancer Mother        dx late 68s   CAD Father    Heart attack Father    Hypertension Father    Diabetes Mellitus II Father        Took insulin   Alcohol abuse Father    Diabetes Mellitus II Sister    Cancer Sister        possible cervical or uterine   Lung cancer Paternal Aunt    Cancer Paternal Uncle        unk type   Diabetes Other        Almost all sisters are diabetic or pre-diabetic    Cancer Cousin        possible colon/stomach/something in abdomen   Breast cancer Neg Hx      Current Facility-Administered Medications:    albumin human 25 % solution 12.5 g, 12.5 g, Intravenous, BID, Singh, Harmeet, MD, Last Rate: 60 mL/hr at 04/26/21 0930,  12.5 g at 04/26/21 0930   calcium-vitamin D (OSCAL WITH D) 500-5 MG-MCG per tablet, , Oral, QPM, Ivor Costa, MD, 1 tablet at 04/25/21 1739   cholecalciferol (VITAMIN D3) tablet 1,000 Units, 1,000 Units, Oral, Daily, Ivor Costa, MD, 1,000 Units at 04/26/21 0920   clopidogrel (PLAVIX) tablet 75 mg, 75 mg, Oral, Daily, Ivor Costa, MD, 75 mg at 04/26/21 0920   feeding supplement (ENSURE ENLIVE / ENSURE PLUS) liquid 237 mL, 237 mL, Oral, TID BM, Sreenath, Sudheer B, MD, 237 mL at 04/25/21 1519   furosemide (LASIX) tablet 20 mg, 20 mg, Oral, Daily, Earlie Server, MD, 20 mg at 04/26/21 0920   guaiFENesin (ROBITUSSIN) 100 MG/5ML liquid 5 mL, 5 mL, Oral, Q4H PRN, Amin, Ankit Chirag, MD   heparin injection 5,000 Units, 5,000 Units, Subcutaneous, Q8H, Ivor Costa, MD, 5,000 Units at 04/26/21 0505   hydrALAZINE (APRESOLINE) injection 10 mg, 10 mg, Intravenous, Q4H PRN, Amin, Ankit Chirag, MD   HYDROmorphone (DILAUDID) injection 0.5 mg, 0.5 mg, Intravenous, Q3H PRN, Sreenath, Sudheer B, MD   insulin aspart (novoLOG) injection 0-15 Units, 0-15 Units, Subcutaneous, TID WC, Sreenath, Sudheer B, MD, 2 Units at 04/26/21 0921   insulin aspart (novoLOG) injection 0-5 Units, 0-5 Units, Subcutaneous, QHS, Ivor Costa, MD, 3 Units at 04/20/21 2349    ipratropium-albuterol (DUONEB) 0.5-2.5 (3) MG/3ML nebulizer solution 3 mL, 3 mL, Nebulization, Q4H PRN, Amin, Ankit Chirag, MD   lactulose ([place]) 10 GM/15ML solution 20 g, 20 g, Oral, BID, Earlie Server, MD, 20 g at 04/25/21 2235   metoprolol tartrate (LOPRESSOR) injection 5 mg, 5 mg, Intravenous, Q4H PRN, Amin, Ankit Chirag, MD   midodrine (PROAMATINE) tablet 5 mg, 5 mg, Oral, TID WC, Singh, Harmeet, MD, 5 mg at 04/26/21 0920   morphine CONCENTRATE 10 MG/0.5ML oral solution 10 mg, 10 mg, Oral, Q6H PRN, Ivor Costa, MD, 10 mg at 04/26/21 2130   multivitamin with minerals tablet 1 tablet, 1 tablet, Oral, Daily, Ivor Costa, MD, 1 tablet at 04/26/21 0919   ondansetron (ZOFRAN) injection 4 mg, 4 mg, Intravenous, Q8H PRN, Ivor Costa, MD, 4 mg at 04/22/21 2018   pantoprazole (PROTONIX) EC tablet 20 mg, 20 mg, Oral, Daily, Ivor Costa, MD, 20 mg at 04/26/21 0920   pneumococcal 23 valent vaccine (PNEUMOVAX-23) injection 0.5 mL, 0.5 mL, Intramuscular, Prior to discharge, Ivor Costa, MD   senna-docusate (Senokot-S) tablet 1 tablet, 1 tablet, Oral, QHS PRN, Amin, Ankit Chirag, MD   spironolactone (ALDACTONE) tablet 50 mg, 50 mg, Oral, Daily, Earlie Server, MD, 50 mg at 04/26/21 0919   Physical exam:  Vitals:   04/25/21 1532 04/25/21 2015 04/26/21 0439 04/26/21 0819  BP: (!) 145/59 (!) 151/62 (!) 149/59 (!) 151/62  Pulse: 80 82 87 86  Resp: _0 Temp: (!) 97.5 F (36.4 C) 98.2 F (36.8 C) 98.4 F (36.9 C) 98.3 F (36.8 C)  TempSrc: Oral   Oral  SpO2: 98% 100% 97% 97%  Weight:      Height:       Physical Exam Constitutional:      General: She is not in acute distress.    Appearance: She is not diaphoretic.  HENT:     Head: Normocephalic and atraumatic.     Mouth/Throat:     Pharynx: No oropharyngeal exudate.  Eyes:     General: No scleral icterus. Cardiovascular:     Rate and Rhythm: Normal rate.     Heart sounds: No murmur heard. Pulmonary:  Effort: Pulmonary effort is normal. No  respiratory distress.  Abdominal:     General: There is distension.     Palpations: Abdomen is soft.     Tenderness: There is no abdominal tenderness.  Musculoskeletal:        General: Swelling present. Normal range of motion.     Cervical back: Normal range of motion and neck supple.  Skin:    General: Skin is warm and dry.     Coloration: Skin is jaundiced.  Neurological:     Mental Status: She is alert and oriented to person, place, and time.     Cranial Nerves: No cranial nerve deficit.     Motor: No abnormal muscle tone.  Psychiatric:        Mood and Affect: Affect normal.       CMP Latest Ref Rng & Units 04/26/2021  Glucose 70 - 99 mg/dL 133(H)  BUN 8 - 23 mg/dL 44(H)  Creatinine 0.44 - 1.00 mg/dL 1.40(H)  Sodium 135 - 145 mmol/L 131(L)  Potassium 3.5 - 5.1 mmol/L 3.7  Chloride 98 - 111 mmol/L 106  CO2 22 - 32 mmol/L 18(L)  Calcium 8.9 - 10.3 mg/dL 8.1(L)  Total Protein 6.5 - 8.1 g/dL 5.8(L)  Total Bilirubin 0.3 - 1.2 mg/dL 8.9(H)  Alkaline Phos 38 - 126 U/L 350(H)  AST 15 - 41 U/L 174(H)  ALT 0 - 44 U/L 59(H)   CBC Latest Ref Rng & Units 04/26/2021  WBC 4.0 - 10.5 K/uL 9.3  Hemoglobin 12.0 - 15.0 g/dL 10.0(L)  Hematocrit 36.0 - 46.0 % 30.5(L)  Platelets 150 - 400 K/uL 285    RADIOGRAPHIC STUDIES: I have personally reviewed the radiological images as listed and agreed with the findings in the report. DG Chest 2 View  Result Date: 03/31/2021 CLINICAL DATA:  Shortness of breath. EXAM: CHEST - 2 VIEW COMPARISON:  None. FINDINGS: Heart size and mediastinal contours are normal. No pleural effusion or edema identified. No airspace opacities. Visualized osseous structures are unremarkable. IMPRESSION: No active cardiopulmonary abnormalities. Electronically Signed   By: Kerby Moors M.D.   On: 03/31/2021 14:04   CT CHEST W CONTRAST  Result Date: 04/01/2021 CLINICAL DATA:  Evaluate for malignancy. Newly discovered liver metastases. EXAM: CT CHEST WITH CONTRAST  TECHNIQUE: Multidetector CT imaging of the chest was performed during intravenous contrast administration. RADIATION DOSE REDUCTION: This exam was performed according to the departmental dose-optimization program which includes automated exposure control, adjustment of the mA and/or kV according to patient size and/or use of iterative reconstruction technique. CONTRAST:  140m OMNIPAQUE IOHEXOL 350 MG/ML SOLN COMPARISON:  CT AP 03/31/2021. FINDINGS: Cardiovascular: No significant vascular findings. Normal heart size. No pericardial effusion. Aortic atherosclerosis and coronary artery calcifications. Mediastinum/Nodes: No enlarged mediastinal, hilar, or axillary lymph nodes. Thyroid gland, trachea, and esophagus demonstrate no significant findings. Lungs/Pleura: Scar like density identified within the superior segment of right lower lobe. No pleural effusion, airspace consolidation, or pneumothorax. No suspicious pulmonary nodule or mass identified. Dependent changes are noted within the posterior lung bases. Upper Abdomen: Multifocal liver metastases are again identified involving both lobes of liver. No acute abnormality noted within the imaged portions of the upper abdomen. Musculoskeletal: Status post left mastectomy with implant reconstruction. There is mild skin thickening with subcutaneous soft tissue stranding within the periphery of the reconstructed left breast, image 90/3. IMPRESSION: 1. No evidence for primary lung neoplasm or metastatic disease to the chest. 2. Status post left mastectomy with implant reconstruction. There is  mild skin thickening with subcutaneous soft tissue stranding within the periphery of the reconstructed left breast. This is nonspecific and may be related to postsurgical change. 3. Multifocal liver metastases. 4. Coronary artery calcifications. 5. Aortic Atherosclerosis (ICD10-I70.0). Electronically Signed   By: Kerby Moors M.D.   On: 04/01/2021 11:42   CT ABDOMEN PELVIS W  CONTRAST  Result Date: 03/31/2021 CLINICAL DATA:  Abdominal pain. EXAM: CT ABDOMEN AND PELVIS WITH CONTRAST TECHNIQUE: Multidetector CT imaging of the abdomen and pelvis was performed using the standard protocol following bolus administration of intravenous contrast. RADIATION DOSE REDUCTION: This exam was performed according to the departmental dose-optimization program which includes automated exposure control, adjustment of the mA and/or kV according to patient size and/or use of iterative reconstruction technique. CONTRAST:  4m OMNIPAQUE IOHEXOL 300 MG/ML  SOLN COMPARISON:  03/02/2020 FINDINGS: Lower chest: Visualized lung bases are clear. Hepatobiliary: Extensive ill-defined low-attenuation lesions are identified throughout both lobes of compatible with metastatic disease. This is a new finding when compared with 03/02/2020. Index lesion within right lobe of liver measures 2.8 by 2.1 cm, image 16/3. Index lesion within segment 4 measures 2.4 x 2.0 cm, image 27/3. Index lesion within segment 2/3 measures 2.4 x 2.3 cm, image 25/3. Status post cholecystectomy. No bile duct dilatation. Pancreas: Unremarkable. No pancreatic ductal dilatation or surrounding inflammatory changes. Spleen: Normal in size without focal abnormality. Adrenals/Urinary Tract: Normal adrenal glands. No kidney mass or hydronephrosis identified. No nephrolithiasis. Urinary bladder is unremarkable. Stomach/Bowel: Stomach appears within normal limits. No small bowel wall thickening, inflammation, or distension. No pathologic dilatation of the colon. No obstructing colonic mass identified. Vascular/Lymphatic: Aortic atherosclerosis. No aneurysm. No abdominopelvic adenopathy. Reproductive: Small exophytic calcified fibroid arises off the anterior uterus. Uterus is otherwise unremarkable. No adnexal mass. Other: No free fluid or fluid collections. No aggressive lytic or sclerotic bone lesions. Musculoskeletal: No acute or significant osseous  findings. IMPRESSION: 1. No acute findings identified within the abdomen or pelvis. 2. Extensive ill-defined low-attenuation lesions throughout both lobes of liver compatible with metastatic disease. This is a new finding when compared with previous CT of the abdomen pelvis from 03/02/2020. No primary neoplasm identified within the abdomen or pelvis. 3. Aortic Atherosclerosis (ICD10-I70.0). Electronically Signed   By: TKerby MoorsM.D.   On: 03/31/2021 14:39   MR 3D Recon At Scanner  Result Date: 04/20/2021 CLINICAL DATA:  Jaundice.  Fever.  Metastatic breast carcinoma. EXAM: MRI ABDOMEN WITHOUT AND WITH CONTRAST (INCLUDING MRCP) TECHNIQUE: Multiplanar multisequence MR imaging of the abdomen was performed both before and after the administration of intravenous contrast. Heavily T2-weighted images of the biliary and pancreatic ducts were obtained, and three-dimensional MRCP images were rendered by post processing. CONTRAST:  7.547mGADAVIST GADOBUTROL 1 MMOL/ML IV SOLN COMPARISON:  CT on 03/31/2021 FINDINGS: Lower chest: Tiny bilateral pleural effusions. Infiltrate or atelectasis in the posterior lung bases, right side greater than left. Hepatobiliary: Image degradation by motion artifact noted. Innumerable hypovascular masses are seen involving the liver diffusely, consistent with diffuse liver metastases. Largest index lesion located in segment 8 measures 5.1 x 4.2 cm on image 31/23. Comparison with prior CT is limited by differences in modality, without definite change when allowing for technical differences. Prior cholecystectomy. No evidence of biliary obstruction. Pancreas:  No mass or inflammatory changes. Spleen:  Within normal limits in size and appearance. Adrenals/Urinary Tract: No masses identified. No evidence of hydronephrosis. Stomach/Bowel: Unremarkable. Vascular/Lymphatic: No pathologically enlarged lymph nodes identified. No acute vascular findings. Other: Mild ascites and  diffuse mesenteric  and body wall edema noted. Musculoskeletal:  No suspicious bone lesions identified. IMPRESSION: Diffuse liver metastases, similar to recent CT. New mild ascites and anasarca. Tiny bilateral pleural effusions, with atelectasis or infiltrate in both lung bases. Prior cholecystectomy.  No evidence of biliary ductal dilatation. Electronically Signed   By: Marlaine Hind M.D.   On: 04/20/2021 14:42   US BIOPSY (LIVER)  Result Date: 04/09/2021 INDICATION: Liver lesions, concern for metastatic breast cancer EXAM: Ultrasound-guided core needle biopsy of liver lesion MEDICATIONS: None. ANESTHESIA/SEDATION: Moderate (conscious) sedation was employed during this procedure. A total of Versed 2 mg and Fentanyl 100 mcg was administered intravenously. Moderate Sedation Time: 12 minutes. The patient's level of consciousness and vital signs were monitored continuously by radiology nursing throughout the procedure under my direct supervision. FLUOROSCOPY TIME:  N/a COMPLICATIONS: None immediate. PROCEDURE: Informed written consent was obtained from the patient after a thorough discussion of the procedural risks, benefits and alternatives. All questions were addressed. Maximal Sterile Barrier Technique was utilized including caps, mask, sterile gowns, sterile gloves, sterile drape, hand hygiene and skin antiseptic. A timeout was performed prior to the initiation of the procedure. The patient was placed supine on the exam table. Limited ultrasound of the liver was performed, demonstrating numerous lesions within the right and left hepatic lobes. It appropriate lesion was identified in the left hepatic lobe amenable for percutaneous biopsy. Skin entry site was marked, the overlying skin was prepped and draped in the standard sterile fashion. Local analgesia was obtained with 1% lidocaine. Using ultrasound guidance, a 17 gauge introducer needle was advanced towards the target lesion in the left hepatic lobe. Subsequently, core needle  biopsy was performed using a 18 gauge core biopsy device x3 passes. Adequate tissue samples were obtained, and submitted in formalin to pathology for further handling. Limited postprocedure imaging demonstrated no hematoma or complicating feature. A clean dressing was placed after manual hemostasis. The patient tolerated the procedure well without immediate complication. IMPRESSION: Successful ultrasound-guided core needle biopsy of focal lesion in the left hepatic lobe. Electronically Signed   By: Albin Felling M.D.   On: 04/09/2021 11:07   US Paracentesis  Result Date: 04/25/2021 INDICATION: Patient with a history of metastatic breast cancer and new onset ascites presents today for a diagnostic and therapeutic paracentesis. EXAM: ULTRASOUND GUIDED PARACENTESIS MEDICATIONS: 1% lidocaine 10 mL COMPLICATIONS: None immediate. PROCEDURE: Informed written consent was obtained from the patient after a discussion of the risks, benefits and alternatives to treatment. A timeout was performed prior to the initiation of the procedure. Initial ultrasound scanning demonstrates a large amount of ascites within the right lower abdominal quadrant. The right lower abdomen was prepped and draped in the usual sterile fashion. 1% lidocaine was used for local anesthesia. Following this, a 19 gauge, 7-cm, Yueh catheter was introduced. An ultrasound image was saved for documentation purposes. The paracentesis was performed. The catheter was removed and a dressing was applied. The patient tolerated the procedure well without immediate post procedural complication. FINDINGS: A total of approximately 1.6 L of clear yellow fluid was removed. Samples were sent to the laboratory as requested by the clinical team. IMPRESSION: Successful ultrasound-guided paracentesis yielding 1.6 liters of peritoneal fluid. Read by: Soyla Dryer, NP Electronically Signed   By: Albin Felling M.D.   On: 04/25/2021 15:14   DG Chest Port 1 View  Result  Date: 04/20/2021 CLINICAL DATA:  Fever. EXAM: PORTABLE CHEST 1 VIEW COMPARISON:  March 31, 2021. FINDINGS: The heart size  and mediastinal contours are within normal limits. Hypoinflation of the lungs is noted with minimal bibasilar subsegmental atelectasis. The visualized skeletal structures are unremarkable. IMPRESSION: Hypoinflation of the lungs with minimal bibasilar subsegmental atelectasis. Electronically Signed   By: Marijo Conception M.D.   On: 04/20/2021 08:49   MR ABDOMEN MRCP W WO CONTAST  Result Date: 04/20/2021 CLINICAL DATA:  Jaundice.  Fever.  Metastatic breast carcinoma. EXAM: MRI ABDOMEN WITHOUT AND WITH CONTRAST (INCLUDING MRCP) TECHNIQUE: Multiplanar multisequence MR imaging of the abdomen was performed both before and after the administration of intravenous contrast. Heavily T2-weighted images of the biliary and pancreatic ducts were obtained, and three-dimensional MRCP images were rendered by post processing. CONTRAST:  7.51m GADAVIST GADOBUTROL 1 MMOL/ML IV SOLN COMPARISON:  CT on 03/31/2021 FINDINGS: Lower chest: Tiny bilateral pleural effusions. Infiltrate or atelectasis in the posterior lung bases, right side greater than left. Hepatobiliary: Image degradation by motion artifact noted. Innumerable hypovascular masses are seen involving the liver diffusely, consistent with diffuse liver metastases. Largest index lesion located in segment 8 measures 5.1 x 4.2 cm on image 31/23. Comparison with prior CT is limited by differences in modality, without definite change when allowing for technical differences. Prior cholecystectomy. No evidence of biliary obstruction. Pancreas:  No mass or inflammatory changes. Spleen:  Within normal limits in size and appearance. Adrenals/Urinary Tract: No masses identified. No evidence of hydronephrosis. Stomach/Bowel: Unremarkable. Vascular/Lymphatic: No pathologically enlarged lymph nodes identified. No acute vascular findings. Other: Mild ascites and diffuse  mesenteric and body wall edema noted. Musculoskeletal:  No suspicious bone lesions identified. IMPRESSION: Diffuse liver metastases, similar to recent CT. New mild ascites and anasarca. Tiny bilateral pleural effusions, with atelectasis or infiltrate in both lung bases. Prior cholecystectomy.  No evidence of biliary ductal dilatation. Electronically Signed   By: JMarlaine HindM.D.   On: 04/20/2021 14:42   [place]Abdomen Limited RUQ (LIVER/GB)  Result Date: 04/17/2021 CLINICAL DATA:  Hyperbilirubinemia. EXAM: ULTRASOUND ABDOMEN LIMITED RIGHT UPPER QUADRANT COMPARISON:  March 31, 2021. FINDINGS: Gallbladder: Status post cholecystectomy. Common bile duct: Diameter: 8 mm which is within normal limits for post cholecystectomy status. Liver: Multiple hypoechoic abnormalities are noted throughout hepatic parenchyma consistent with metastatic disease. Portal vein is patent on color Doppler imaging with normal direction of blood flow towards the liver. Other: None. IMPRESSION: Status post cholecystectomy. Diffuse hepatic metastatic disease is noted. Electronically Signed   By: JMarijo ConceptionM.D.   On: 04/17/2021 09:36    Assessment and plan-   #Metastatic breast cancer with extensive liver involvement, hyperbilirubinemia/impaired liver function, coagulopathy Status post fulvestrant 500 mg x 1 on 04/23/21 Bilirubin level continues to trend down We discussed about possible chemotherapy next week if she is stable.  I had a lengthy discussion with patient and husband. They are aware that patient's bilirubin and creatinine are both getting better and I feel her condition potentially could improve with additional treatments.  Patient did say " I want to live", she understands that on comfort care she will receive treatment to make her comfortable, she will not receive any cancer treatment to prolong her life. Her husband is concerned about not able to care patient at home as he has heart condition and he does not want  patient to suffer. We discussed about option of physical therapy evaluation and going to rehab.nursing home if needed. Husband still prefers patient to go on hospice. Eventually patient says "I am tired of all these, I do not see a happy end  about this. I want hospice"   Hospitalist Dr.Amin was updated.    Thank you for allowing me to participate in the care of this patient.   Earlie Server, MD, PhD Hematology Oncology  04/26/2021

## 2021-04-26 NOTE — Progress Notes (Signed)
Mobility Specialist - Progress Note   04/26/21 1300  Mobility  Activity Transferred to/from [facility]  Level of Assistance Minimal assist, patient does 75% or more  Assistive Device BSC  Distance Ambulated (ft) 2 ft  Activity Response Tolerated well  $Mobility charge 1 Mobility     Pt transferred bed-BSC with minA. Pt left on [facility] with RN present.    Kathee Delton Mobility Specialist 04/26/21, 1:54 PM

## 2021-04-26 NOTE — Plan of Care (Signed)
°  Problem: Education: Goal: Knowledge of General Education information will improve Description: Including pain rating scale, medication(s)/side effects and non-pharmacologic comfort measures 04/26/2021 1318 by Evangeline Dakin, RN Outcome: Adequate for Discharge 04/26/2021 1222 by Evangeline Dakin, RN Outcome: Progressing   Problem: Health Behavior/Discharge Planning: Goal: Ability to manage health-related needs will improve Outcome: Adequate for Discharge   Problem: Clinical Measurements: Goal: Ability to maintain clinical measurements within normal limits will improve Outcome: Adequate for Discharge Goal: Will remain free from infection Outcome: Adequate for Discharge Goal: Diagnostic test results will improve Outcome: Adequate for Discharge Goal: Respiratory complications will improve Outcome: Adequate for Discharge Goal: Cardiovascular complication will be avoided Outcome: Adequate for Discharge   Problem: Activity: Goal: Risk for activity intolerance will decrease 04/26/2021 1318 by Evangeline Dakin, RN Outcome: Adequate for Discharge 04/26/2021 1222 by Evangeline Dakin, RN Outcome: Progressing   Problem: Nutrition: Goal: Adequate nutrition will be maintained 04/26/2021 1318 by Evangeline Dakin, RN Outcome: Adequate for Discharge 04/26/2021 1222 by Evangeline Dakin, RN Outcome: Progressing   Problem: Coping: Goal: Level of anxiety will decrease 04/26/2021 1318 by Evangeline Dakin, RN Outcome: Adequate for Discharge 04/26/2021 1222 by Evangeline Dakin, RN Outcome: Progressing   Problem: Elimination: Goal: Will not experience complications related to bowel motility 04/26/2021 1318 by Evangeline Dakin, RN Outcome: Adequate for Discharge 04/26/2021 1222 by Evangeline Dakin, RN Outcome: Progressing Goal: Will not experience complications related to urinary retention 04/26/2021 1318 by Evangeline Dakin, RN Outcome: Adequate for Discharge 04/26/2021 1222 by Evangeline Dakin,  RN Outcome: Progressing   Problem: Pain Managment: Goal: General experience of comfort will improve 04/26/2021 1318 by Evangeline Dakin, RN Outcome: Adequate for Discharge 04/26/2021 1222 by Evangeline Dakin, RN Outcome: Progressing   Problem: Safety: Goal: Ability to remain free from injury will improve 04/26/2021 1318 by Evangeline Dakin, RN Outcome: Adequate for Discharge 04/26/2021 1222 by Evangeline Dakin, RN Outcome: Progressing   Problem: Skin Integrity: Goal: Risk for impaired skin integrity will decrease 04/26/2021 1318 by Evangeline Dakin, RN Outcome: Adequate for Discharge 04/26/2021 1222 by Evangeline Dakin, RN Outcome: Progressing

## 2021-04-26 NOTE — Discharge Summary (Signed)
Physician Discharge Summary  Tammy Elliott UKG:[id number] DOB: 1951/02/07 DOA: 04/20/2021  PCP: Baxter Hire, MD  Admit date: 04/20/2021 Discharge date: 04/26/2021  Admitted From: Home Disposition:  Hospice   Recommendations for Outpatient Follow-up:  Follow up with PCP in 1-2 weeks Please obtain BMP/CBC in one week your next doctors visit.  Start Lasix, Aldactone, Lactulose, Midodrine, Senna Stop Janumet, Jardiance and Actos   Discharge Condition: Stable CODE STATUS: DNR Diet recommendation: Diabetic, but can transition to comfort.   Brief/Interim Summary: 71 year old female with metastatic breast cancer to liver, DM 2, CAD status post PCI, SMA stenosis admitted for fever and chills.  No obvious source was found besides possible UTI.  She did receive broad-spectrum antibiotics for 5 days and eventually discontinued on 2/14.  Urine cultures have grown Staph aureus.  She does have significant metastatic breast cancer, oncology team is following.  Due to liver metastases she does have transaminitis but no obvious evidence of obstruction seen on MRCP.  Palliative care team is following. After extensive discussion ptn transitioned to comfort care with hospice. Transfer to the facility today. Recs as above.      Assessment & Plan:   Sepsis from urinary tract infection secondary to Staph aureus. -Patient treated with 5 days of antibiotics.  Sepsis physiology has resolved.   Liver metastasis and Metastatic breast cancer [facility]) -Patient has been followed by oncology team.  Limited options.  Patient received 1 dose of fulvestrant 500 mg on 2/13. PPI daily.  -Overall very poor prognosis.. Transition to comfort care to hospice.    Abnormal LFTs Hyperbilirubinemia -Secondary to liver metastases.  No obstruction seen on the MRCP.   Anasarca, ascites -lasix 20mg  po daily and Aldactone 50mg  po daily   AKI (acute kidney injury) ([place]) Metabolic Acidosis -DC IV fluids nephrology team is  following. Cr 1.3 > 1.6 > 1.4There is a concern for hepatorenal syndrome.  Midodrine 5mg  tid   Hypoalbuminemia - Albumin supplements   CAD (coronary artery disease) -No Chest pain, cont Plavix   Diabetes mellitus type 2 with complications (HCC)  Recent A1c 7.0..  Meds as above.    GERD (gastroesophageal reflux disease) -Protonix   Hyperlipemia -Hold atorvastatin due to abnormal liver function   Superior mesenteric artery stenosis (HCC) -Plavix   Hyponatremia -resolved.    Goals of care - Patient overall has very poor prognosis with metastatic breast cancer.  Palliative care team is following. Hospice care team.   Body mass index is 25.85 kg/m.         Discharge Diagnoses:  Principal Problem:   SIRS (systemic inflammatory response syndrome) (HCC) Active Problems:   CAD (coronary artery disease)   Diabetes mellitus type 2 with complications (HCC)   GERD (gastroesophageal reflux disease)   Hyperlipemia   Superior mesenteric artery stenosis (HCC)   Liver metastasis (HCC)   Metastatic breast cancer (HCC)   Abnormal LFTs   Hyponatremia   AKI (acute kidney injury) ([place])   Jaundice   Palliative care encounter   Other ascites   Coagulopathy ([place])   Hyperbilirubinemia   Encephalopathy, hepatic      Consultations: Palliative Oncology Renal  Subjective: Feels oks no complaints. Ready to transition to hospice facility.   Discharge Exam: Vitals:   04/26/21 0439 04/26/21 0819  BP: (!) 149/59 (!) 151/62  Pulse: 87 86  Resp: 16 14  Temp: 98.4 F (36.9 C) 98.3 F (36.8 C)  SpO2: 97% 97%   Vitals:   04/25/21 1532 04/25/21 2015 04/26/21  0439 04/26/21 0819  BP: (!) 145/59 (!) 151/62 (!) 149/59 (!) 151/62  Pulse: 80 82 87 86  Resp: 18 16 16 14   Temp: (!) 97.5 F (36.4 C) 98.2 F (36.8 C) 98.4 F (36.9 C) 98.3 F (36.8 C)  TempSrc: Oral   Oral  SpO2: 98% 100% 97% 97%  Weight:      Height:        General: Pt is alert, awake, not in acute  distress Cardiovascular: RRR, S1/S2 +, no rubs, no gallops Respiratory: CTA bilaterally, no wheezing, no rhonchi Abdominal: Soft, NT, ND, bowel sounds + Extremities: no edema, no cyanosis Jaundiced.   Discharge Instructions   Allergies as of 04/26/2021       Reactions   Penicillins Hives, Swelling   Exenatide Nausea Only   Orlistat Diarrhea   Thiazide-type Diuretics    Unknown reaction        Medication List     STOP taking these medications    Janumet 50-500 MG tablet Generic drug: sitaGLIPtin-metformin   Jardiance 25 MG Tabs tablet Generic drug: empagliflozin   pioglitazone 30 MG tablet Commonly known as: ACTOS       TAKE these medications    acetaminophen 500 MG tablet Commonly known as: TYLENOL Take 1,000 mg by mouth every 6 (six) hours as needed for moderate pain.   anastrozole 1 MG tablet Commonly known as: ARIMIDEX Take 1 tablet (1 mg total) by mouth daily.   CALCIUM-MAGNESIUM-ZINC PO Take 1 tablet by mouth every evening.   clopidogrel 75 MG tablet Commonly known as: PLAVIX Take 75 mg by mouth daily.   Contour Next Test test strip Generic drug: glucose blood Use once daily e11.65   dexamethasone 4 MG tablet Commonly known as: DECADRON Take 1 tablet (4 mg total) by mouth daily.   furosemide 20 MG tablet Commonly known as: LASIX Take 1 tablet (20 mg total) by mouth daily.   glimepiride 4 MG tablet Commonly known as: AMARYL Take 4 mg by mouth 2 (two) times daily.   lactulose 10 GM/15ML solution Commonly known as: CHRONULAC Take 30 mLs (20 g total) by mouth 2 (two) times daily.   lansoprazole 15 MG capsule Commonly known as: PREVACID Take 15 mg by mouth daily as needed (acid reflux).   lovastatin 40 MG tablet Commonly known as: MEVACOR Take 40 mg by mouth every evening.   metFORMIN 500 MG tablet Commonly known as: GLUCOPHAGE Take 500 mg by mouth 2 (two) times daily.   midodrine 5 MG tablet Commonly known as: PROAMATINE Take 1  tablet (5 mg total) by mouth 3 (three) times daily with meals.   morphine CONCENTRATE 10 mg / 0.5 ml concentrated solution Take 0.5 mLs (10 mg total) by mouth every 6 (six) hours as needed for severe pain.   multivitamin tablet Take 1 tablet by mouth daily.   ondansetron 8 MG tablet Commonly known as: Zofran Take 1 tablet (8 mg total) by mouth 2 (two) times daily as needed (Nausea or vomiting).   prochlorperazine 10 MG tablet Commonly known as: COMPAZINE Take 1 tablet (10 mg total) by mouth every 6 (six) hours as needed (Nausea or vomiting).   senna-docusate 8.6-50 MG tablet Commonly known as: Senokot-S Take 1 tablet by mouth at bedtime as needed for moderate constipation.   spironolactone 50 MG tablet Commonly known as: ALDACTONE Take 1 tablet (50 mg total) by mouth daily.   Vitamin D (Cholecalciferol) 25 MCG (1000 UT) Caps Take 1 daily  Durable Medical Equipment  (From admission, onward)           Start     Ordered   04/21/21 1705  For home use only DME Walker rolling  Once       Question Answer Comment  Walker: With Latta Wheels   Patient needs a walker to treat with the following condition Weakness      04/21/21 1704   04/21/21 1403  For home use only DME Hospital bed  Once       Question Answer Comment  Length of Need Lifetime   The above medical condition requires: Patient requires the ability to reposition frequently   Bed type Semi-electric      04/21/21 1402   04/21/21 1402  For home use only DME Bedside commode  Once       Question:  Patient needs a bedside commode to treat with the following condition  Answer:  Weakness   04/21/21 1402            Allergies  Allergen Reactions   Penicillins Hives and Swelling   Exenatide Nausea Only   Orlistat Diarrhea   Thiazide-Type Diuretics     Unknown reaction    You were cared for by a hospitalist during your hospital stay. If you have any questions about your discharge  medications or the care you received while you were in the hospital after you are discharged, you can call the unit and asked to speak with the hospitalist on call if the hospitalist that took care of you is not available. Once you are discharged, your primary care physician will handle any further medical issues. Please note that no refills for any discharge medications will be authorized once you are discharged, as it is imperative that you return to your primary care physician (or establish a relationship with a primary care physician if you do not have one) for your aftercare needs so that they can reassess your need for medications and monitor your lab values.   Procedures/Studies: DG Chest 2 View  Result Date: 03/31/2021 CLINICAL DATA:  Shortness of breath. EXAM: CHEST - 2 VIEW COMPARISON:  None. FINDINGS: Heart size and mediastinal contours are normal. No pleural effusion or edema identified. No airspace opacities. Visualized osseous structures are unremarkable. IMPRESSION: No active cardiopulmonary abnormalities. Electronically Signed   By: Kerby Moors M.D.   On: 03/31/2021 14:04   CT CHEST W CONTRAST  Result Date: 04/01/2021 CLINICAL DATA:  Evaluate for malignancy. Newly discovered liver metastases. EXAM: CT CHEST WITH CONTRAST TECHNIQUE: Multidetector CT imaging of the chest was performed during intravenous contrast administration. RADIATION DOSE REDUCTION: This exam was performed according to the departmental dose-optimization program which includes automated exposure control, adjustment of the mA and/or kV according to patient size and/or use of iterative reconstruction technique. CONTRAST:  150mL OMNIPAQUE IOHEXOL 350 MG/ML SOLN COMPARISON:  CT AP 03/31/2021. FINDINGS: Cardiovascular: No significant vascular findings. Normal heart size. No pericardial effusion. Aortic atherosclerosis and coronary artery calcifications. Mediastinum/Nodes: No enlarged mediastinal, hilar, or axillary lymph  nodes. Thyroid gland, trachea, and esophagus demonstrate no significant findings. Lungs/Pleura: Scar like density identified within the superior segment of right lower lobe. No pleural effusion, airspace consolidation, or pneumothorax. No suspicious pulmonary nodule or mass identified. Dependent changes are noted within the posterior lung bases. Upper Abdomen: Multifocal liver metastases are again identified involving both lobes of liver. No acute abnormality noted within the imaged portions of the upper abdomen. Musculoskeletal: Status post left mastectomy  with implant reconstruction. There is mild skin thickening with subcutaneous soft tissue stranding within the periphery of the reconstructed left breast, image 90/3. IMPRESSION: 1. No evidence for primary lung neoplasm or metastatic disease to the chest. 2. Status post left mastectomy with implant reconstruction. There is mild skin thickening with subcutaneous soft tissue stranding within the periphery of the reconstructed left breast. This is nonspecific and may be related to postsurgical change. 3. Multifocal liver metastases. 4. Coronary artery calcifications. 5. Aortic Atherosclerosis (ICD10-I70.0). Electronically Signed   By: Kerby Moors M.D.   On: 04/01/2021 11:42   CT ABDOMEN PELVIS W CONTRAST  Result Date: 03/31/2021 CLINICAL DATA:  Abdominal pain. EXAM: CT ABDOMEN AND PELVIS WITH CONTRAST TECHNIQUE: Multidetector CT imaging of the abdomen and pelvis was performed using the standard protocol following bolus administration of intravenous contrast. RADIATION DOSE REDUCTION: This exam was performed according to the departmental dose-optimization program which includes automated exposure control, adjustment of the mA and/or kV according to patient size and/or use of iterative reconstruction technique. CONTRAST:  46mL OMNIPAQUE IOHEXOL 300 MG/ML  SOLN COMPARISON:  03/02/2020 FINDINGS: Lower chest: Visualized lung bases are clear. Hepatobiliary:  Extensive ill-defined low-attenuation lesions are identified throughout both lobes of compatible with metastatic disease. This is a new finding when compared with 03/02/2020. Index lesion within right lobe of liver measures 2.8 by 2.1 cm, image 16/3. Index lesion within segment 4 measures 2.4 x 2.0 cm, image 27/3. Index lesion within segment 2/3 measures 2.4 x 2.3 cm, image 25/3. Status post cholecystectomy. No bile duct dilatation. Pancreas: Unremarkable. No pancreatic ductal dilatation or surrounding inflammatory changes. Spleen: Normal in size without focal abnormality. Adrenals/Urinary Tract: Normal adrenal glands. No kidney mass or hydronephrosis identified. No nephrolithiasis. Urinary bladder is unremarkable. Stomach/Bowel: Stomach appears within normal limits. No small bowel wall thickening, inflammation, or distension. No pathologic dilatation of the colon. No obstructing colonic mass identified. Vascular/Lymphatic: Aortic atherosclerosis. No aneurysm. No abdominopelvic adenopathy. Reproductive: Small exophytic calcified fibroid arises off the anterior uterus. Uterus is otherwise unremarkable. No adnexal mass. Other: No free fluid or fluid collections. No aggressive lytic or sclerotic bone lesions. Musculoskeletal: No acute or significant osseous findings. IMPRESSION: 1. No acute findings identified within the abdomen or pelvis. 2. Extensive ill-defined low-attenuation lesions throughout both lobes of liver compatible with metastatic disease. This is a new finding when compared with previous CT of the abdomen pelvis from 03/02/2020. No primary neoplasm identified within the abdomen or pelvis. 3. Aortic Atherosclerosis (ICD10-I70.0). Electronically Signed   By: Kerby Moors M.D.   On: 03/31/2021 14:39   MR 3D Recon At Scanner  Result Date: 04/20/2021 CLINICAL DATA:  Jaundice.  Fever.  Metastatic breast carcinoma. EXAM: MRI ABDOMEN WITHOUT AND WITH CONTRAST (INCLUDING MRCP) TECHNIQUE: Multiplanar  multisequence MR imaging of the abdomen was performed both before and after the administration of intravenous contrast. Heavily T2-weighted images of the biliary and pancreatic ducts were obtained, and three-dimensional MRCP images were rendered by post processing. CONTRAST:  7.26mL GADAVIST GADOBUTROL 1 MMOL/ML IV SOLN COMPARISON:  CT on 03/31/2021 FINDINGS: Lower chest: Tiny bilateral pleural effusions. Infiltrate or atelectasis in the posterior lung bases, right side greater than left. Hepatobiliary: Image degradation by motion artifact noted. Innumerable hypovascular masses are seen involving the liver diffusely, consistent with diffuse liver metastases. Largest index lesion located in segment 8 measures 5.1 x 4.2 cm on image 31/23. Comparison with prior CT is limited by differences in modality, without definite change when allowing for technical differences. Prior cholecystectomy.  No evidence of biliary obstruction. Pancreas:  No mass or inflammatory changes. Spleen:  Within normal limits in size and appearance. Adrenals/Urinary Tract: No masses identified. No evidence of hydronephrosis. Stomach/Bowel: Unremarkable. Vascular/Lymphatic: No pathologically enlarged lymph nodes identified. No acute vascular findings. Other: Mild ascites and diffuse mesenteric and body wall edema noted. Musculoskeletal:  No suspicious bone lesions identified. IMPRESSION: Diffuse liver metastases, similar to recent CT. New mild ascites and anasarca. Tiny bilateral pleural effusions, with atelectasis or infiltrate in both lung bases. Prior cholecystectomy.  No evidence of biliary ductal dilatation. Electronically Signed   By: Marlaine Hind M.D.   On: 04/20/2021 14:42   US BIOPSY (LIVER)  Result Date: 04/09/2021 INDICATION: Liver lesions, concern for metastatic breast cancer EXAM: Ultrasound-guided core needle biopsy of liver lesion MEDICATIONS: None. ANESTHESIA/SEDATION: Moderate (conscious) sedation was employed during this  procedure. A total of Versed 2 mg and Fentanyl 100 mcg was administered intravenously. Moderate Sedation Time: 12 minutes. The patient's level of consciousness and vital signs were monitored continuously by radiology nursing throughout the procedure under my direct supervision. FLUOROSCOPY TIME:  N/a COMPLICATIONS: None immediate. PROCEDURE: Informed written consent was obtained from the patient after a thorough discussion of the procedural risks, benefits and alternatives. All questions were addressed. Maximal Sterile Barrier Technique was utilized including caps, mask, sterile gowns, sterile gloves, sterile drape, hand hygiene and skin antiseptic. A timeout was performed prior to the initiation of the procedure. The patient was placed supine on the exam table. Limited ultrasound of the liver was performed, demonstrating numerous lesions within the right and left hepatic lobes. It appropriate lesion was identified in the left hepatic lobe amenable for percutaneous biopsy. Skin entry site was marked, the overlying skin was prepped and draped in the standard sterile fashion. Local analgesia was obtained with 1% lidocaine. Using ultrasound guidance, a 17 gauge introducer needle was advanced towards the target lesion in the left hepatic lobe. Subsequently, core needle biopsy was performed using a 18 gauge core biopsy device x3 passes. Adequate tissue samples were obtained, and submitted in formalin to pathology for further handling. Limited postprocedure imaging demonstrated no hematoma or complicating feature. A clean dressing was placed after manual hemostasis. The patient tolerated the procedure well without immediate complication. IMPRESSION: Successful ultrasound-guided core needle biopsy of focal lesion in the left hepatic lobe. Electronically Signed   By: Albin Felling M.D.   On: 04/09/2021 11:07   US Paracentesis  Result Date: 04/25/2021 INDICATION: Patient with a history of metastatic breast cancer and  new onset ascites presents today for a diagnostic and therapeutic paracentesis. EXAM: ULTRASOUND GUIDED PARACENTESIS MEDICATIONS: 1% lidocaine 10 mL COMPLICATIONS: None immediate. PROCEDURE: Informed written consent was obtained from the patient after a discussion of the risks, benefits and alternatives to treatment. A timeout was performed prior to the initiation of the procedure. Initial ultrasound scanning demonstrates a large amount of ascites within the right lower abdominal quadrant. The right lower abdomen was prepped and draped in the usual sterile fashion. 1% lidocaine was used for local anesthesia. Following this, a 19 gauge, 7-cm, Yueh catheter was introduced. An ultrasound image was saved for documentation purposes. The paracentesis was performed. The catheter was removed and a dressing was applied. The patient tolerated the procedure well without immediate post procedural complication. FINDINGS: A total of approximately 1.6 L of clear yellow fluid was removed. Samples were sent to the laboratory as requested by the clinical team. IMPRESSION: Successful ultrasound-guided paracentesis yielding 1.6 liters of peritoneal fluid. Read by:  Soyla Dryer, NP Electronically Signed   By: Albin Felling M.D.   On: 04/25/2021 15:14   DG Chest Port 1 View  Result Date: 04/20/2021 CLINICAL DATA:  Fever. EXAM: PORTABLE CHEST 1 VIEW COMPARISON:  March 31, 2021. FINDINGS: The heart size and mediastinal contours are within normal limits. Hypoinflation of the lungs is noted with minimal bibasilar subsegmental atelectasis. The visualized skeletal structures are unremarkable. IMPRESSION: Hypoinflation of the lungs with minimal bibasilar subsegmental atelectasis. Electronically Signed   By: Marijo Conception M.D.   On: 04/20/2021 08:49   MR ABDOMEN MRCP W WO CONTAST  Result Date: 04/20/2021 CLINICAL DATA:  Jaundice.  Fever.  Metastatic breast carcinoma. EXAM: MRI ABDOMEN WITHOUT AND WITH CONTRAST (INCLUDING MRCP)  TECHNIQUE: Multiplanar multisequence MR imaging of the abdomen was performed both before and after the administration of intravenous contrast. Heavily T2-weighted images of the biliary and pancreatic ducts were obtained, and three-dimensional MRCP images were rendered by post processing. CONTRAST:  7.76mL GADAVIST GADOBUTROL 1 MMOL/ML IV SOLN COMPARISON:  CT on 03/31/2021 FINDINGS: Lower chest: Tiny bilateral pleural effusions. Infiltrate or atelectasis in the posterior lung bases, right side greater than left. Hepatobiliary: Image degradation by motion artifact noted. Innumerable hypovascular masses are seen involving the liver diffusely, consistent with diffuse liver metastases. Largest index lesion located in segment 8 measures 5.1 x 4.2 cm on image 31/23. Comparison with prior CT is limited by differences in modality, without definite change when allowing for technical differences. Prior cholecystectomy. No evidence of biliary obstruction. Pancreas:  No mass or inflammatory changes. Spleen:  Within normal limits in size and appearance. Adrenals/Urinary Tract: No masses identified. No evidence of hydronephrosis. Stomach/Bowel: Unremarkable. Vascular/Lymphatic: No pathologically enlarged lymph nodes identified. No acute vascular findings. Other: Mild ascites and diffuse mesenteric and body wall edema noted. Musculoskeletal:  No suspicious bone lesions identified. IMPRESSION: Diffuse liver metastases, similar to recent CT. New mild ascites and anasarca. Tiny bilateral pleural effusions, with atelectasis or infiltrate in both lung bases. Prior cholecystectomy.  No evidence of biliary ductal dilatation. Electronically Signed   By: Marlaine Hind M.D.   On: 04/20/2021 14:42   US Abdomen Limited RUQ (LIVER/GB)  Result Date: 04/17/2021 CLINICAL DATA:  Hyperbilirubinemia. EXAM: ULTRASOUND ABDOMEN LIMITED RIGHT UPPER QUADRANT COMPARISON:  March 31, 2021. FINDINGS: Gallbladder: Status post cholecystectomy. Common bile  duct: Diameter: 8 mm which is within normal limits for post cholecystectomy status. Liver: Multiple hypoechoic abnormalities are noted throughout hepatic parenchyma consistent with metastatic disease. Portal vein is patent on color Doppler imaging with normal direction of blood flow towards the liver. Other: None. IMPRESSION: Status post cholecystectomy. Diffuse hepatic metastatic disease is noted. Electronically Signed   By: Marijo Conception M.D.   On: 04/17/2021 09:36     The results of significant diagnostics from this hospitalization (including imaging, microbiology, ancillary and laboratory) are listed below for reference.     Microbiology: Recent Results (from the past 240 hour(s))  Resp Panel by RT-PCR (Flu A&B, Covid) Nasopharyngeal Swab     Status: None   Collection Time: 04/20/21  8:29 AM   Specimen: Nasopharyngeal Swab; Nasopharyngeal(NP) swabs in vial transport medium  Result Value Ref Range Status   SARS Coronavirus 2 by RT PCR NEGATIVE NEGATIVE Final    Comment: (NOTE) SARS-CoV-2 target nucleic acids are NOT DETECTED.  The SARS-CoV-2 RNA is generally detectable in upper respiratory specimens during the acute phase of infection. The lowest concentration of SARS-CoV-2 viral copies this assay can detect is 138 copies/mL.  A negative result does not preclude SARS-Cov-2 infection and should not be used as the sole basis for treatment or other patient management decisions. A negative result may occur with  improper specimen collection/handling, submission of specimen other than nasopharyngeal swab, presence of viral mutation(s) within the areas targeted by this assay, and inadequate number of viral copies(<138 copies/mL). A negative result must be combined with clinical observations, patient history, and epidemiological information. The expected result is Negative.  Fact Sheet for Patients:  EntrepreneurPulse.com.au  Fact Sheet for Healthcare Providers:   IncredibleEmployment.be  This test is no t yet approved or cleared by the [place] FDA and  has been authorized for detection and/or diagnosis of SARS-CoV-2 by FDA under an Emergency Use Authorization (EUA). This EUA will remain  in effect (meaning this test can be used) for the duration of the COVID-19 declaration under Section 564(b)(1) of the Act, 21 U.S.C.section 360bbb-3(b)(1), unless the authorization is terminated  or revoked sooner.       Influenza A by PCR NEGATIVE NEGATIVE Final   Influenza B by PCR NEGATIVE NEGATIVE Final    Comment: (NOTE) The Xpert Xpress SARS-CoV-2/FLU/RSV plus assay is intended as an aid in the diagnosis of influenza from Nasopharyngeal swab specimens and should not be used as a sole basis for treatment. Nasal washings and aspirates are unacceptable for Xpert Xpress SARS-CoV-2/FLU/RSV testing.  Fact Sheet for Patients: EntrepreneurPulse.com.au  Fact Sheet for Healthcare Providers: IncredibleEmployment.be  This test is not yet approved or cleared by the [place] FDA and has been authorized for detection and/or diagnosis of SARS-CoV-2 by FDA under an Emergency Use Authorization (EUA). This EUA will remain in effect (meaning this test can be used) for the duration of the COVID-19 declaration under Section 564(b)(1) of the Act, 21 U.S.C. section 360bbb-3(b)(1), unless the authorization is terminated or revoked.  Performed at [facility], [address] [place], [place] [address]   Culture, blood (Routine x 2)     Status: None   Collection Time: 04/20/21  8:34 AM   Specimen: BLOOD  Result Value Ref Range Status   Specimen Description BLOOD RH  Final   Special Requests   Final    BOTTLES DRAWN AEROBIC AND ANAEROBIC Blood Culture results may not be optimal due to an inadequate volume of blood received in culture bottles   Culture   Final    NO GROWTH 5 DAYS Performed at  [facility], [address] [place], [place] [address]    Report Status 04/25/2021 FINAL  Final  Culture, blood (Routine x 2)     Status: None   Collection Time: 04/20/21  8:39 AM   Specimen: BLOOD  Result Value Ref Range Status   Specimen Description BLOOD RH  Final   Special Requests   Final    BOTTLES DRAWN AEROBIC AND ANAEROBIC Blood Culture results may not be optimal due to an inadequate volume of blood received in culture bottles   Culture   Final    NO GROWTH 5 DAYS Performed at [facility], [address], [place], [place] [address]    Report Status 04/25/2021 FINAL  Final  Urine Culture     Status: Abnormal   Collection Time: 04/21/21  3:45 AM   Specimen: Urine, Random  Result Value Ref Range Status   Specimen Description   Final    URINE, RANDOM Performed at [facility], [address], [place], [place] [address]    Special Requests  Final    NONE Performed at [facility], [place], [place] [address]    Culture 50,000 COLONIES/mL STAPHYLOCOCCUS AUREUS (A)  Final   Report Status 04/23/2021 FINAL  Final   Organism ID, Bacteria STAPHYLOCOCCUS AUREUS (A)  Final      Susceptibility   Staphylococcus aureus - MIC*    CIPROFLOXACIN <=0.5 SENSITIVE Sensitive     GENTAMICIN <=0.5 SENSITIVE Sensitive     NITROFURANTOIN <=16 SENSITIVE Sensitive     OXACILLIN <=0.25 SENSITIVE Sensitive     TETRACYCLINE <=1 SENSITIVE Sensitive     VANCOMYCIN <=0.5 SENSITIVE Sensitive     TRIMETH/SULFA <=10 SENSITIVE Sensitive     CLINDAMYCIN <=0.25 SENSITIVE Sensitive     RIFAMPIN <=0.5 SENSITIVE Sensitive     Inducible Clindamycin NEGATIVE Sensitive     * 50,000 COLONIES/mL STAPHYLOCOCCUS AUREUS  Surgical pcr screen     Status: Abnormal   Collection Time: 04/21/21  7:50 AM   Specimen: Nasal Mucosa; Nasal Swab  Result Value Ref Range Status   MRSA, PCR NEGATIVE NEGATIVE Final   Staphylococcus aureus POSITIVE (A) NEGATIVE Final     Comment: (NOTE) The Xpert SA Assay (FDA approved for NASAL specimens in patients 55 years of age and older), is one component of a comprehensive surveillance program. It is not intended to diagnose infection nor to guide or monitor treatment. Performed at [facility], [address] [place], [place] [address]   Body fluid culture w Gram Stain     Status: None (Preliminary result)   Collection Time: 04/25/21  2:26 PM   Specimen: PATH Cytology Peritoneal fluid  Result Value Ref Range Status   Specimen Description   Final    PERITONEAL Performed at [facility], [address] [place], [place] [address]    Special Requests   Final    NONE Performed at [facility], [address] [place], [place] [address]    Gram Stain   Final    RARE WBC PRESENT, PREDOMINANTLY MONONUCLEAR NO ORGANISMS SEEN Performed at [facility], [place] [address], [place], [place] [address]    Culture PENDING  Incomplete   Report Status PENDING  Incomplete     Labs: BNP (last 3 results) Recent Labs    03/31/21 1252  BNP 45.6   Basic Metabolic Panel: Recent Labs  Lab 04/22/21 0341 04/23/21 0526 04/24/21 0508 04/25/21 0439 04/26/21 0443  NA 128* 130* 131* 131* 131*  K 3.7 4.1 3.6 4.2 3.7  CL 103 100 105 107 106  CO2 16* 18* 17* 15* 18*  GLUCOSE 141* 204* 58* 120* 133*  BUN 39* 36* 39* 43* 44*  CREATININE 1.09* 1.13* 1.33* 1.60* 1.40*  CALCIUM 7.8* 8.2* 8.0* 8.0* 8.1*  MG  --   --   --   --  2.4  PHOS  --   --   --   --  4.3   Liver Function Tests: Recent Labs  Lab 04/22/21 0341 04/23/21 0526 04/24/21 0508 04/25/21 0439 04/26/21 0443  AST 189* 168* 156* 167* 174*  ALT 88* 80* 63* 62* 59*  ALKPHOS 362* 361* 357* 364* 350*  BILITOT 11.5* 12.3* 10.6* 9.3* 8.9*  PROT 5.7* 6.1* 5.7* 5.6* 5.8*  ALBUMIN 1.7* 1.8* 1.6* 1.7* 1.9*   No results for input(s): LIPASE, AMYLASE in the last 168 hours. Recent Labs  Lab 04/24/21 0508  AMMONIA 54*    CBC: Recent Labs  Lab 04/20/21 0829 04/21/21 0400 04/26/21 0443  WBC 11.5* 10.4 9.3  NEUTROABS 8.9*  --   --   HGB 10.6* 9.7* 10.0*  HCT 33.2* 30.2* 30.5*  MCV 98.8 96.8 93.0  PLT 330 270 285   Cardiac Enzymes: No results for input(s): CKTOTAL, CKMB, CKMBINDEX, TROPONINI in the last 168 hours. BNP: Invalid input(s): POCBNP CBG: Recent Labs  Lab 04/25/21 0759 04/25/21 1125 04/25/21 1646 04/25/21 2136 04/26/21 0818  GLUCAP 94 176* 156* 190* 146*   D-Dimer No results for input(s): DDIMER in the last 72 hours. Hgb A1c No results for input(s): HGBA1C in the last 72 hours. Lipid Profile No results for input(s): CHOL, HDL, LDLCALC, TRIG, CHOLHDL, LDLDIRECT in the last 72 hours. Thyroid function studies No results for input(s): TSH, T4TOTAL, T3FREE, THYROIDAB in the last 72 hours.  Invalid input(s): FREET3 Anemia work up No results for input(s): VITAMINB12, FOLATE, FERRITIN, TIBC, IRON, RETICCTPCT in the last 72 hours. Urinalysis    Component Value Date/Time   COLORURINE AMBER (A) 04/21/2021 0345   APPEARANCEUR CLOUDY (A) 04/21/2021 0345   LABSPEC 1.020 04/21/2021 0345   PHURINE 5.0 04/21/2021 0345   GLUCOSEU 50 (A) 04/21/2021 0345   HGBUR SMALL (A) 04/21/2021 0345   BILIRUBINUR MODERATE (A) 04/21/2021 0345   KETONESUR NEGATIVE 04/21/2021 0345   PROTEINUR 30 (A) 04/21/2021 0345   UROBILINOGEN 0.2 04/19/2010 1901   NITRITE NEGATIVE 04/21/2021 0345   LEUKOCYTESUR LARGE (A) 04/21/2021 0345   Sepsis Labs Invalid input(s): PROCALCITONIN,  WBC,  LACTICIDVEN Microbiology Recent Results (from the past 240 hour(s))  Resp Panel by RT-PCR (Flu A&B, Covid) Nasopharyngeal Swab     Status: None   Collection Time: 04/20/21  8:29 AM   Specimen: Nasopharyngeal Swab; Nasopharyngeal(NP) swabs in vial transport medium  Result Value Ref Range Status   SARS Coronavirus 2 by RT PCR NEGATIVE NEGATIVE Final    Comment: (NOTE) SARS-CoV-2 target nucleic acids are NOT  DETECTED.  The SARS-CoV-2 RNA is generally detectable in upper respiratory specimens during the acute phase of infection. The lowest concentration of SARS-CoV-2 viral copies this assay can detect is 138 copies/mL. A negative result does not preclude SARS-Cov-2 infection and should not be used as the sole basis for treatment or other patient management decisions. A negative result may occur with  improper specimen collection/handling, submission of specimen other than nasopharyngeal swab, presence of viral mutation(s) within the areas targeted by this assay, and inadequate number of viral copies(<138 copies/mL). A negative result must be combined with clinical observations, patient history, and epidemiological information. The expected result is Negative.  Fact Sheet for Patients:  EntrepreneurPulse.com.au  Fact Sheet for Healthcare Providers:  IncredibleEmployment.be  This test is no t yet approved or cleared by the [place] FDA and  has been authorized for detection and/or diagnosis of SARS-CoV-2 by FDA under an Emergency Use Authorization (EUA). This EUA will remain  in effect (meaning this test can be used) for the duration of the COVID-19 declaration under Section 564(b)(1) of the Act, 21 U.S.C.section 360bbb-3(b)(1), unless the authorization is terminated  or revoked sooner.       Influenza A by PCR NEGATIVE NEGATIVE Final   Influenza B by PCR NEGATIVE NEGATIVE Final    Comment: (NOTE) The Xpert Xpress SARS-CoV-2/FLU/RSV plus assay is intended as an aid in the diagnosis of influenza from Nasopharyngeal swab specimens and should not be used as a sole basis for treatment. Nasal washings and aspirates are unacceptable for Xpert Xpress SARS-CoV-2/FLU/RSV testing.  Fact Sheet for Patients: EntrepreneurPulse.com.au  Fact Sheet  for Healthcare Providers: IncredibleEmployment.be  This test is not yet  approved or cleared by the [place] and has been authorized for detection and/or diagnosis of SARS-CoV-2 by FDA under an Emergency Use Authorization (EUA). This EUA will remain in effect (meaning this test can be used) for the duration of the COVID-19 declaration under Section 564(b)(1) of the Act, 21 U.S.C. section 360bbb-3(b)(1), unless the authorization is terminated or revoked.  Performed at [facility], [address] [place], [place] [address]   Culture, blood (Routine x 2)     Status: None   Collection Time: 04/20/21  8:34 AM   Specimen: BLOOD  Result Value Ref Range Status   Specimen Description BLOOD RH  Final   Special Requests   Final    BOTTLES DRAWN AEROBIC AND ANAEROBIC Blood Culture results may not be optimal due to an inadequate volume of blood received in culture bottles   Culture   Final    NO GROWTH 5 DAYS Performed at [facility], [address] [place], [place] [address]    Report Status 04/25/2021 FINAL  Final  Culture, blood (Routine x 2)     Status: None   Collection Time: 04/20/21  8:39 AM   Specimen: BLOOD  Result Value Ref Range Status   Specimen Description BLOOD RH  Final   Special Requests   Final    BOTTLES DRAWN AEROBIC AND ANAEROBIC Blood Culture results may not be optimal due to an inadequate volume of blood received in culture bottles   Culture   Final    NO GROWTH 5 DAYS Performed at [facility], [address] [place], [place] [address]    Report Status 04/25/2021 FINAL  Final  Urine Culture     Status: Abnormal   Collection Time: 04/21/21  3:45 AM   Specimen: Urine, Random  Result Value Ref Range Status   Specimen Description   Final    URINE, RANDOM Performed at [facility], [address] [place], [place] [address]    Special Requests   Final    NONE Performed at [facility], [place], [place] [address]    Culture 50,000 COLONIES/mL STAPHYLOCOCCUS  AUREUS (A)  Final   Report Status 04/23/2021 FINAL  Final   Organism ID, Bacteria STAPHYLOCOCCUS AUREUS (A)  Final      Susceptibility   Staphylococcus aureus - MIC*    CIPROFLOXACIN <=0.5 SENSITIVE Sensitive     GENTAMICIN <=0.5 SENSITIVE Sensitive     NITROFURANTOIN <=16 SENSITIVE Sensitive     OXACILLIN <=0.25 SENSITIVE Sensitive     TETRACYCLINE <=1 SENSITIVE Sensitive     VANCOMYCIN <=0.5 SENSITIVE Sensitive     TRIMETH/SULFA <=10 SENSITIVE Sensitive     CLINDAMYCIN <=0.25 SENSITIVE Sensitive     RIFAMPIN <=0.5 SENSITIVE Sensitive     Inducible Clindamycin NEGATIVE Sensitive     * 50,000 COLONIES/mL STAPHYLOCOCCUS AUREUS  Surgical pcr screen     Status: Abnormal   Collection Time: 04/21/21  7:50 AM   Specimen: Nasal Mucosa; Nasal Swab  Result Value Ref Range Status   MRSA, PCR NEGATIVE NEGATIVE Final   Staphylococcus aureus POSITIVE (A) NEGATIVE Final    Comment: (NOTE) The Xpert SA Assay (FDA approved for NASAL specimens in patients 5 years of age and older), is one component of a comprehensive surveillance program. It is not intended to diagnose infection nor to guide or monitor treatment. Performed at [facility], [address] [place], [place] [address]   Body fluid culture w Gram Stain     Status: None (Preliminary result)   Collection Time: 04/25/21  2:26 PM   Specimen: PATH Cytology Peritoneal fluid  Result Value Ref Range Status   Specimen Description   Final    PERITONEAL Performed at [facility], [address] [place], [place] [address]    Special Requests   Final    NONE Performed at [facility], [address] [place], [place] [address]    Gram Stain   Final    RARE WBC PRESENT, PREDOMINANTLY MONONUCLEAR NO ORGANISMS SEEN Performed at [facility], [place] [address], [place], [place] [address]    Culture PENDING  Incomplete   Report Status PENDING  Incomplete     Time coordinating discharge:  I have spent  35 minutes face to face with the patient and on the ward discussing the patients care, assessment, plan and disposition with other care givers. >50% of the time was devoted counseling the patient about the risks and benefits of treatment/Discharge disposition and coordinating care.   SIGNED:   Damita Lack, MD  [facility] 04/26/2021, 9:15 AM   If 7PM-7AM, please contact night-coverage

## 2021-04-26 NOTE — Progress Notes (Signed)
This RN spoke with Santiago Glad from the [facility] to give report. Waiting arrival of EMS.

## 2021-04-26 NOTE — Progress Notes (Signed)
[facility]  ROUNDING NOTE   Subjective:   Patient seen resting quietly with husband at bedside Alert, oriented, tearful  Husband states they have opted for hospice care   Objective:  Vital signs in last 24 hours:  Temp:  [97.5 F (36.4 C)-98.4 F (36.9 C)] 98.3 F (36.8 C) (02/16 0819) Pulse Rate:  [80-87] 86 (02/16 0819) Resp:  [14-18] 14 (02/16 0819) BP: (133-151)/(54-62) 151/62 (02/16 0819) SpO2:  [97 %-100 %] 97 % (02/16 0819)  Weight change:  Filed Weights   04/20/21 0829  Weight: 77.1 kg    Intake/Output: I/O last 3 completed shifts: In: 650.6 [P.O.:560; IV Piggyback:90.6] Out: 450 [Urine:450]   Intake/Output this shift:  No intake/output data recorded.  Physical Exam: General: NAD  Head: Normocephalic, atraumatic. Moist oral mucosal membranes  Eyes: Anicteric  Lungs:  Clear to auscultation, normal effort  Heart: Regular rate and rhythm  Abdomen:  Soft, nontender, distended  Extremities:  2+ peripheral edema.  Neurologic: Nonfocal, moving all four extremities  Skin: No lesions       Basic Metabolic Panel: Recent Labs  Lab 04/22/21 0341 04/23/21 0526 04/24/21 0508 04/25/21 0439 04/26/21 0443  NA 128* 130* 131* 131* 131*  K 3.7 4.1 3.6 4.2 3.7  CL 103 100 105 107 106  CO2 16* 18* 17* 15* 18*  GLUCOSE 141* 204* 58* 120* 133*  BUN 39* 36* 39* 43* 44*  CREATININE 1.09* 1.13* 1.33* 1.60* 1.40*  CALCIUM 7.8* 8.2* 8.0* 8.0* 8.1*  MG  --   --   --   --  2.4  PHOS  --   --   --   --  4.3     Liver Function Tests: Recent Labs  Lab 04/22/21 0341 04/23/21 0526 04/24/21 0508 04/25/21 0439 04/26/21 0443  AST 189* 168* 156* 167* 174*  ALT 88* 80* 63* 62* 59*  ALKPHOS 362* 361* 357* 364* 350*  BILITOT 11.5* 12.3* 10.6* 9.3* 8.9*  PROT 5.7* 6.1* 5.7* 5.6* 5.8*  ALBUMIN 1.7* 1.8* 1.6* 1.7* 1.9*    No results for input(s): LIPASE, AMYLASE in the last 168 hours. Recent Labs  Lab 04/24/21 0508  AMMONIA 54*     CBC: Recent  Labs  Lab 04/20/21 0829 04/21/21 0400 04/26/21 0443  WBC 11.5* 10.4 9.3  NEUTROABS 8.9*  --   --   HGB 10.6* 9.7* 10.0*  HCT 33.2* 30.2* 30.5*  MCV 98.8 96.8 93.0  PLT 330 270 285     Cardiac Enzymes: No results for input(s): CKTOTAL, CKMB, CKMBINDEX, TROPONINI in the last 168 hours.  BNP: Invalid input(s): POCBNP  CBG: Recent Labs  Lab 04/25/21 0759 04/25/21 1125 04/25/21 1646 04/25/21 2136 04/26/21 0818  GLUCAP 94 176* 156* 190* 146*     Microbiology: Results for orders placed or performed during the hospital encounter of 04/20/21  Resp Panel by RT-PCR (Flu A&B, Covid) Nasopharyngeal Swab     Status: None   Collection Time: 04/20/21  8:29 AM   Specimen: Nasopharyngeal Swab; Nasopharyngeal(NP) swabs in vial transport medium  Result Value Ref Range Status   SARS Coronavirus 2 by RT PCR NEGATIVE NEGATIVE Final    Comment: (NOTE) SARS-CoV-2 target nucleic acids are NOT DETECTED.  The SARS-CoV-2 RNA is generally detectable in upper respiratory specimens during the acute phase of infection. The lowest concentration of SARS-CoV-2 viral copies this assay can detect is 138 copies/mL. A negative result does not preclude SARS-Cov-2 infection and should not be used as the sole basis for  treatment or other patient management decisions. A negative result may occur with  improper specimen collection/handling, submission of specimen other than nasopharyngeal swab, presence of viral mutation(s) within the areas targeted by this assay, and inadequate number of viral copies(<138 copies/mL). A negative result must be combined with clinical observations, patient history, and epidemiological information. The expected result is Negative.  Fact Sheet for Patients:  EntrepreneurPulse.com.au  Fact Sheet for Healthcare Providers:  IncredibleEmployment.be  This test is no t yet approved or cleared by the [place] FDA and  has been authorized  for detection and/or diagnosis of SARS-CoV-2 by FDA under an Emergency Use Authorization (EUA). This EUA will remain  in effect (meaning this test can be used) for the duration of the COVID-19 declaration under Section 564(b)(1) of the Act, 21 U.S.C.section 360bbb-3(b)(1), unless the authorization is terminated  or revoked sooner.       Influenza A by PCR NEGATIVE NEGATIVE Final   Influenza B by PCR NEGATIVE NEGATIVE Final    Comment: (NOTE) The Xpert Xpress SARS-CoV-2/FLU/RSV plus assay is intended as an aid in the diagnosis of influenza from Nasopharyngeal swab specimens and should not be used as a sole basis for treatment. Nasal washings and aspirates are unacceptable for Xpert Xpress SARS-CoV-2/FLU/RSV testing.  Fact Sheet for Patients: EntrepreneurPulse.com.au  Fact Sheet for Healthcare Providers: IncredibleEmployment.be  This test is not yet approved or cleared by the [place] FDA and has been authorized for detection and/or diagnosis of SARS-CoV-2 by FDA under an Emergency Use Authorization (EUA). This EUA will remain in effect (meaning this test can be used) for the duration of the COVID-19 declaration under Section 564(b)(1) of the Act, 21 U.S.C. section 360bbb-3(b)(1), unless the authorization is terminated or revoked.  Performed at [facility], [address] [place], [place] [address]   Culture, blood (Routine x 2)     Status: None   Collection Time: 04/20/21  8:34 AM   Specimen: BLOOD  Result Value Ref Range Status   Specimen Description BLOOD RH  Final   Special Requests   Final    BOTTLES DRAWN AEROBIC AND ANAEROBIC Blood Culture results may not be optimal due to an inadequate volume of blood received in culture bottles   Culture   Final    NO GROWTH 5 DAYS Performed at [facility], [address] [place], [place] [address]    Report Status 04/25/2021 FINAL  Final  Culture, blood (Routine x  2)     Status: None   Collection Time: 04/20/21  8:39 AM   Specimen: BLOOD  Result Value Ref Range Status   Specimen Description BLOOD RH  Final   Special Requests   Final    BOTTLES DRAWN AEROBIC AND ANAEROBIC Blood Culture results may not be optimal due to an inadequate volume of blood received in culture bottles   Culture   Final    NO GROWTH 5 DAYS Performed at [facility], [address] [place], [place] [address]    Report Status 04/25/2021 FINAL  Final  Urine Culture     Status: Abnormal   Collection Time: 04/21/21  3:45 AM   Specimen: Urine, Random  Result Value Ref Range Status   Specimen Description   Final    URINE, RANDOM Performed at [facility], [address], [place], [place] [address]    Special Requests   Final    NONE Performed at [facility], [address] [place], [place] [address]  Culture 50,000 COLONIES/mL STAPHYLOCOCCUS AUREUS (A)  Final   Report Status 04/23/2021 FINAL  Final   Organism ID, Bacteria STAPHYLOCOCCUS AUREUS (A)  Final      Susceptibility   Staphylococcus aureus - MIC*    CIPROFLOXACIN <=0.5 SENSITIVE Sensitive     GENTAMICIN <=0.5 SENSITIVE Sensitive     NITROFURANTOIN <=16 SENSITIVE Sensitive     OXACILLIN <=0.25 SENSITIVE Sensitive     TETRACYCLINE <=1 SENSITIVE Sensitive     VANCOMYCIN <=0.5 SENSITIVE Sensitive     TRIMETH/SULFA <=10 SENSITIVE Sensitive     CLINDAMYCIN <=0.25 SENSITIVE Sensitive     RIFAMPIN <=0.5 SENSITIVE Sensitive     Inducible Clindamycin NEGATIVE Sensitive     * 50,000 COLONIES/mL STAPHYLOCOCCUS AUREUS  Surgical pcr screen     Status: Abnormal   Collection Time: 04/21/21  7:50 AM   Specimen: Nasal Mucosa; Nasal Swab  Result Value Ref Range Status   MRSA, PCR NEGATIVE NEGATIVE Final   Staphylococcus aureus POSITIVE (A) NEGATIVE Final    Comment: (NOTE) The Xpert SA Assay (FDA approved for NASAL specimens in patients 20 years of age and older), is one component of a  comprehensive surveillance program. It is not intended to diagnose infection nor to guide or monitor treatment. Performed at [facility], [address] [place], [place] [address]   Body fluid culture w Gram Stain     Status: None (Preliminary result)   Collection Time: 04/25/21  2:26 PM   Specimen: PATH Cytology Peritoneal fluid  Result Value Ref Range Status   Specimen Description   Final    PERITONEAL Performed at [facility], [address], [place], [place] [address]    Special Requests   Final    NONE Performed at [facility], [address] [place], [place] [address]    Gram Stain   Final    RARE WBC PRESENT, PREDOMINANTLY MONONUCLEAR NO ORGANISMS SEEN    Culture   Final    NO GROWTH < 24 HOURS Performed at [facility], [place] [address], [place], [place] [address]    Report Status PENDING  Incomplete    Coagulation Studies: No results for input(s): LABPROT, INR in the last 72 hours.  Urinalysis: No results for input(s): COLORURINE, LABSPEC, PHURINE, GLUCOSEU, HGBUR, BILIRUBINUR, KETONESUR, PROTEINUR, UROBILINOGEN, NITRITE, LEUKOCYTESUR in the last 72 hours.  Invalid input(s): APPERANCEUR    Imaging: US Paracentesis  Result Date: 04/25/2021 INDICATION: Patient with a history of metastatic breast cancer and new onset ascites presents today for a diagnostic and therapeutic paracentesis. EXAM: ULTRASOUND GUIDED PARACENTESIS MEDICATIONS: 1% lidocaine 10 mL COMPLICATIONS: None immediate. PROCEDURE: Informed written consent was obtained from the patient after a discussion of the risks, benefits and alternatives to treatment. A timeout was performed prior to the initiation of the procedure. Initial ultrasound scanning demonstrates a large amount of ascites within the right lower abdominal quadrant. The right lower abdomen was prepped and draped in the usual sterile fashion. 1% lidocaine was used for local anesthesia. Following this, a 19  gauge, 7-cm, Yueh catheter was introduced. An ultrasound image was saved for documentation purposes. The paracentesis was performed. The catheter was removed and a dressing was applied. The patient tolerated the procedure well without immediate post procedural complication. FINDINGS: A total of approximately 1.6 L of clear yellow fluid was removed. Samples were sent to the laboratory as requested by the clinical team. IMPRESSION: Successful ultrasound-guided paracentesis yielding 1.6 liters of peritoneal fluid. Read by: Soyla Dryer,  NP Electronically Signed   By: Albin Felling M.D.   On: 04/25/2021 15:14     Medications:    albumin human 12.5 g (04/26/21 0930)    calcium-vitamin D   Oral QPM   cholecalciferol  1,000 Units Oral Daily   clopidogrel  75 mg Oral Daily   feeding supplement  237 mL Oral TID BM   furosemide  20 mg Oral Daily   heparin  5,000 Units Subcutaneous Q8H   insulin aspart  0-15 Units Subcutaneous TID WC   insulin aspart  0-5 Units Subcutaneous QHS   lactulose  20 g Oral BID   midodrine  5 mg Oral TID WC   multivitamin with minerals  1 tablet Oral Daily   pantoprazole  20 mg Oral Daily   spironolactone  50 mg Oral Daily   guaiFENesin, hydrALAZINE, HYDROmorphone (DILAUDID) injection, ipratropium-albuterol, metoprolol tartrate, morphine CONCENTRATE, ondansetron (ZOFRAN) IV, pneumococcal 23 valent vaccine, senna-docusate  Assessment/ Plan:  Ms. ZAILEE VALLELY is a 71 y.o.  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  Normal renal function recorded on 04/16/2021.  Creatinine currently 1.33 with GFR 44.   Currently receiving  midodrine mg 3 times daily along with furosemide 20 mg p.o. daily.  Lab Results  Component Value Date   CREATININE 1.40 (H) 04/26/2021   CREATININE 1.60 (H) 04/25/2021   CREATININE 1.33 (H) 04/24/2021    Intake/Output Summary (Last 24 hours) at 04/26/2021 1112 Last data filed at 04/26/2021 0510 Gross per 24 hour  Intake 210.62 ml  Output 450 ml  Net -239.38 ml    2.   Liver metastasis and metastatic breast cancer             -Oncology following             -Albumin slightly improved at 1.9.  -Received palliative paracentesis yesterday, 1.6 L clear yellow fluid removed.  Due to current plan for hospice services, we will sign off at this time.  Feel free to contact us with any questions or concerns.  Thank you for allowing [place] to be a part of the team.   LOS: 6 Colon Flattery, NP 2/16/202311:12 AM

## 2021-04-29 LAB — BODY FLUID CULTURE W GRAM STAIN: Culture: NO GROWTH

## 2021-04-30 ENCOUNTER — Inpatient Hospital Stay: Payer: Medicare Other

## 2021-04-30 ENCOUNTER — Inpatient Hospital Stay: Payer: Medicare Other | Admitting: Oncology

## 2021-05-04 ENCOUNTER — Other Ambulatory Visit: Payer: Self-pay | Admitting: Oncology

## 2021-05-04 DIAGNOSIS — Z7189 Other specified counseling: Secondary | ICD-10-CM

## 2021-05-04 DIAGNOSIS — C50919 Malignant neoplasm of unspecified site of unspecified female breast: Secondary | ICD-10-CM

## 2021-05-09 DEATH — deceased

## 2021-06-10 ENCOUNTER — Encounter: Payer: Self-pay | Admitting: Oncology

## 2022-08-11 IMAGING — CR DG CHEST 2V
2 series · 2 of 2 positions shown · non-contrast
Comparison: None.

CLINICAL DATA: Shortness of breath.

EXAM:
CHEST - 2 VIEW

[chest pa]
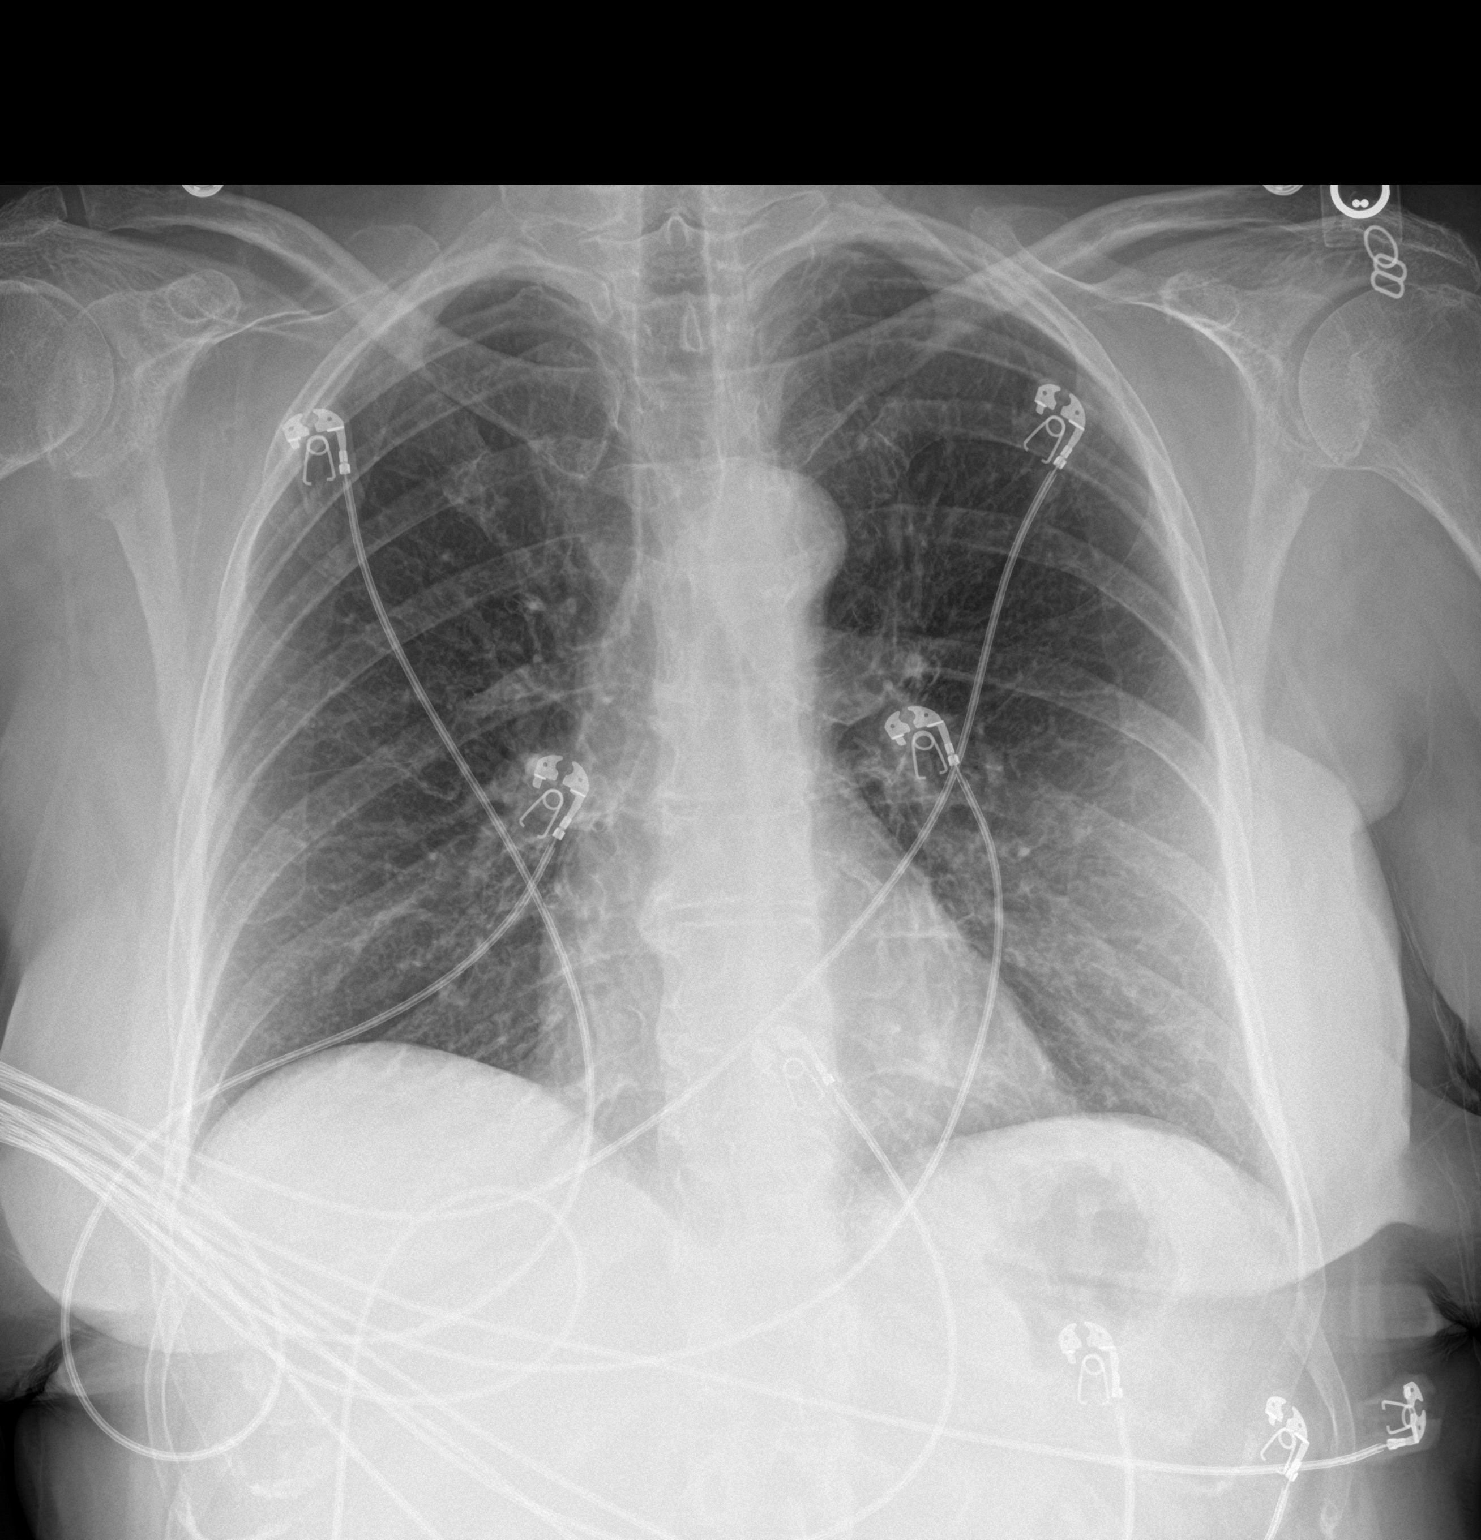

[chest lat]
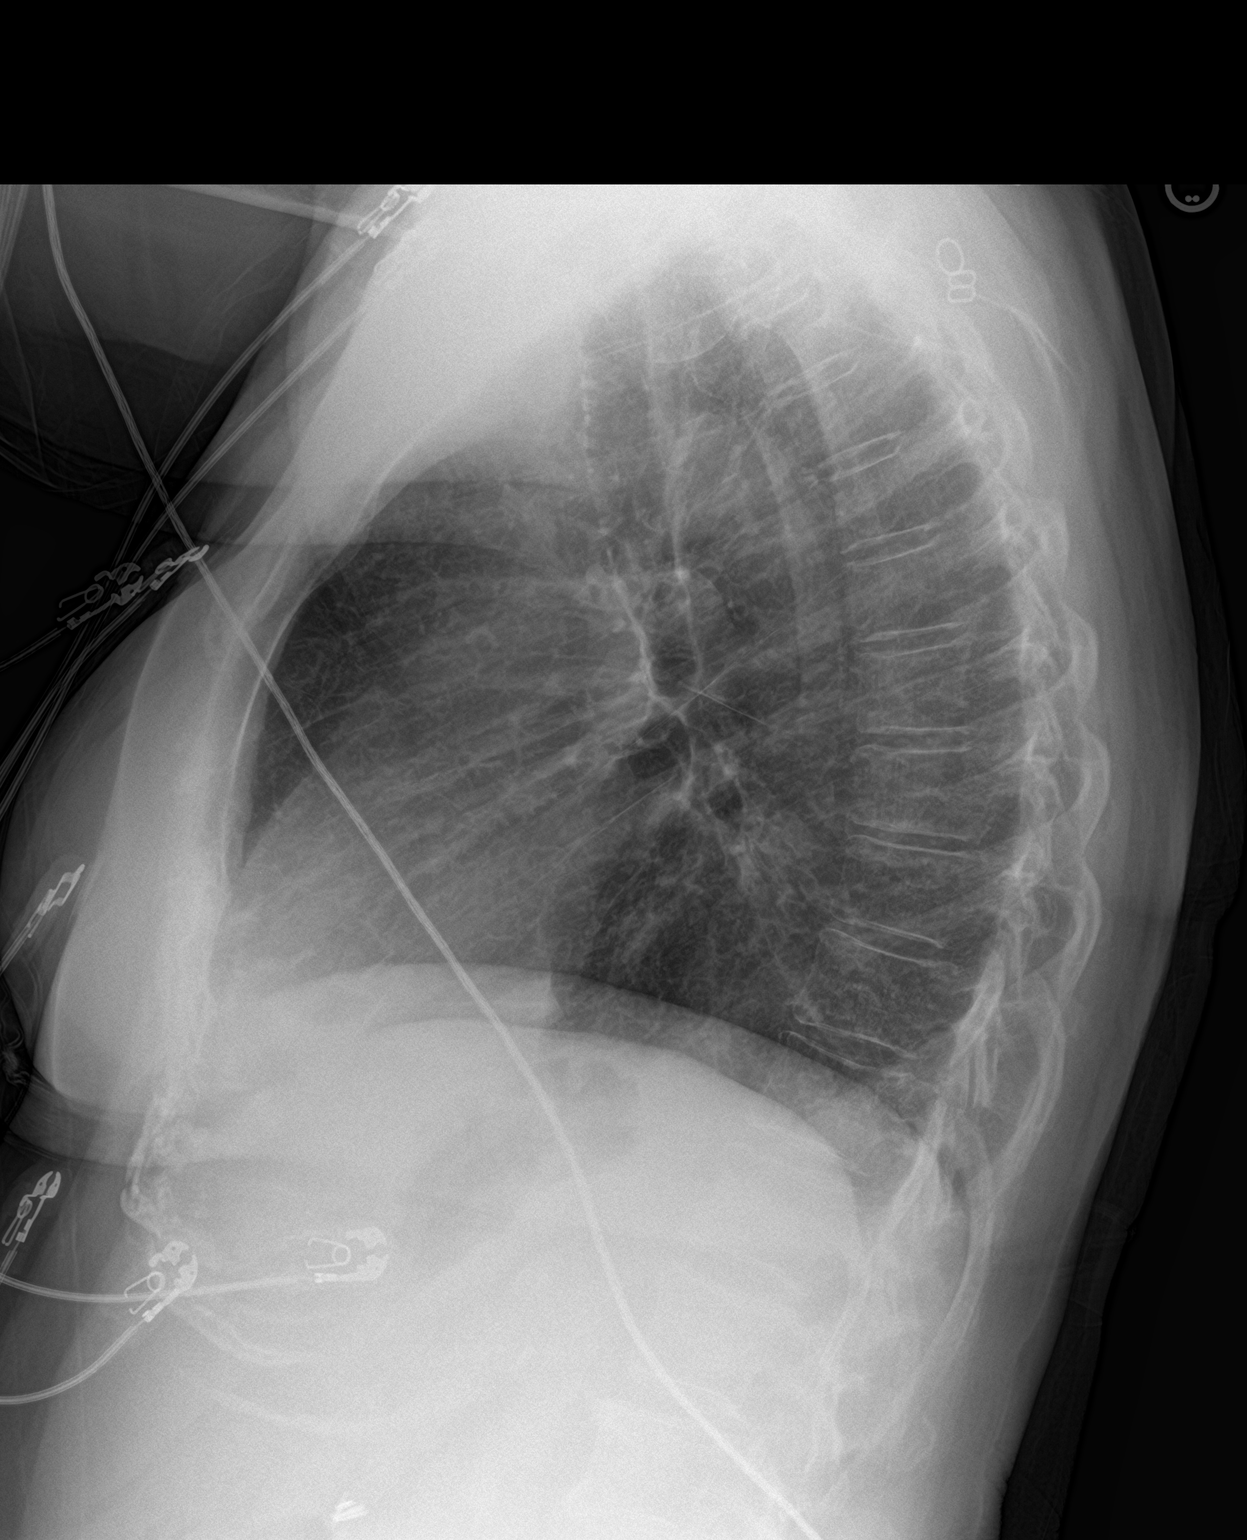

[2 of 2 positions shown; findings below may reference images not displayed]

FINDINGS: Heart size and mediastinal contours are normal. No pleural effusion
or edema identified. No airspace opacities. Visualized osseous
structures are unremarkable.
IMPRESSION: No active cardiopulmonary abnormalities.

## 2022-08-11 IMAGING — CT CT ABD-PELV W/ CM
2 of 5 series · 15 of 46 positions shown, 17 images · IV contrast (agent unspecified)
Comparison: 03/02/2020

CLINICAL DATA: Abdominal pain.

EXAM:
CT ABDOMEN AND PELVIS WITH CONTRAST
TECHNIQUE: Multidetector CT imaging of the abdomen and pelvis was performed
using the standard protocol following bolus administration of
intravenous contrast.

[Series 3: a/p w/ 5mm · axial · 0.78mm/px · z∈[-1111,-641]mm · 12 of 106 slices shown, 14 images]
[im 6/106  soft-tissue]
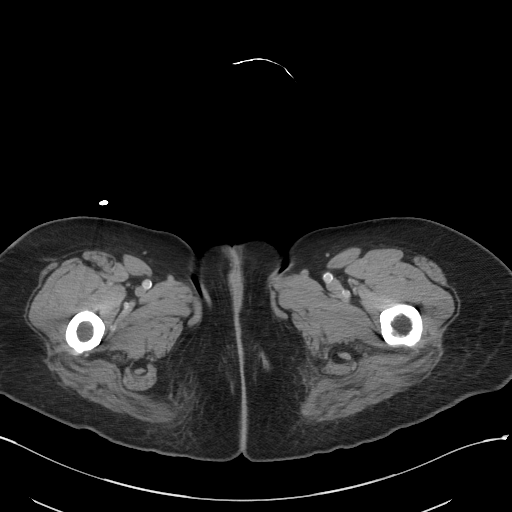
[im 6/106  bone]
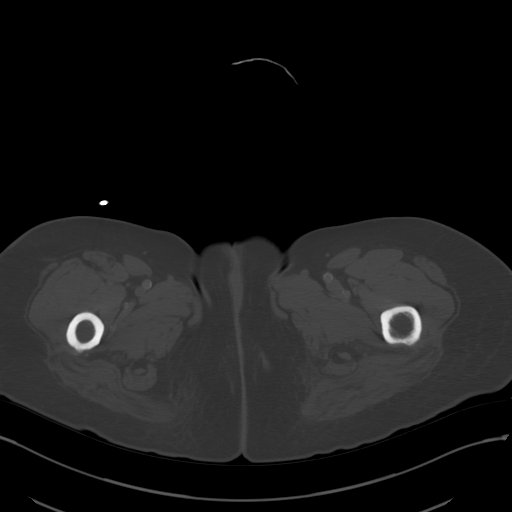
[im 18/106  soft-tissue]
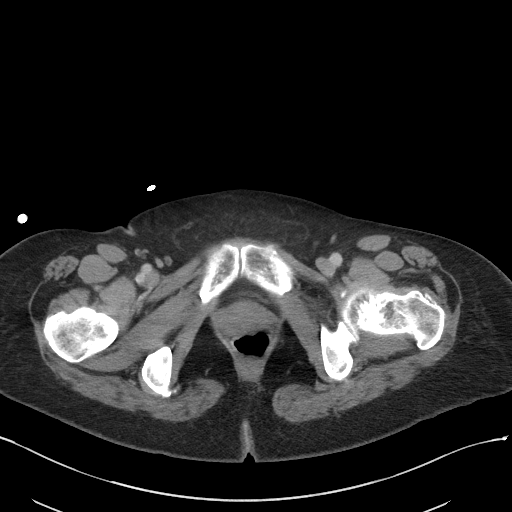
[im 24/106  soft-tissue]
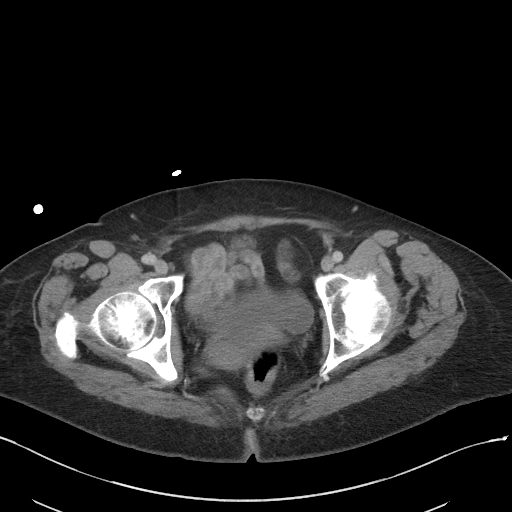
[im 30/106  soft-tissue]
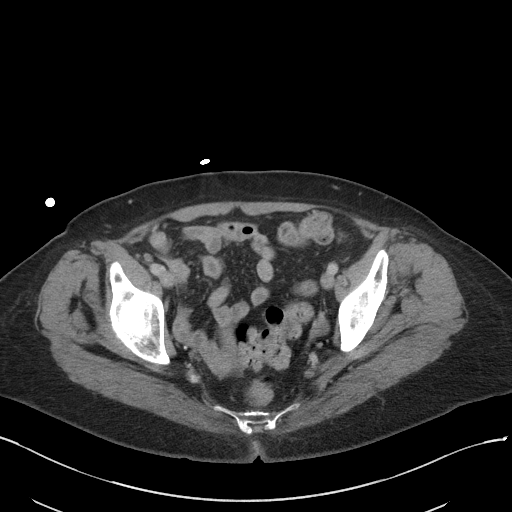
[im 41/106  soft-tissue]
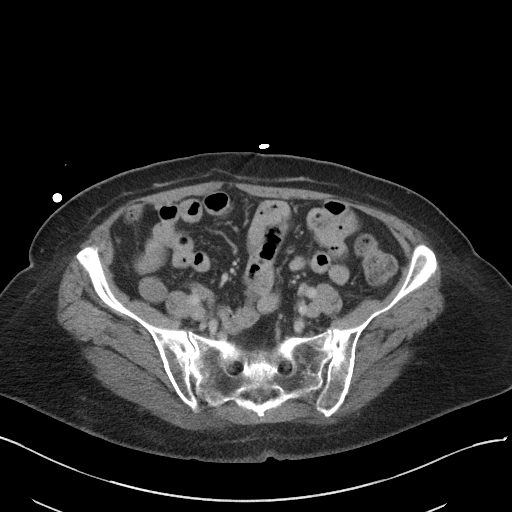
[im 47/106  soft-tissue]
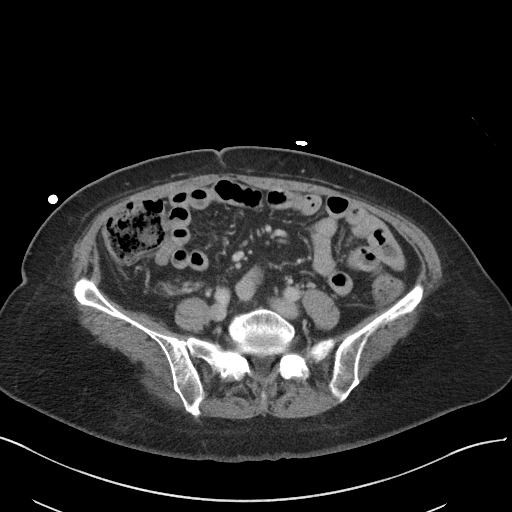
[im 59/106  soft-tissue]
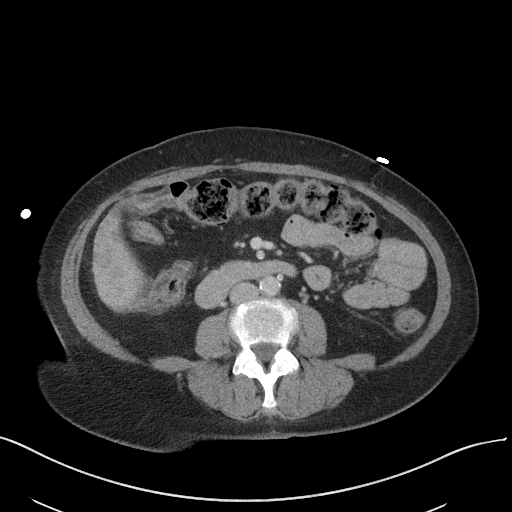
[im 65/106  soft-tissue]
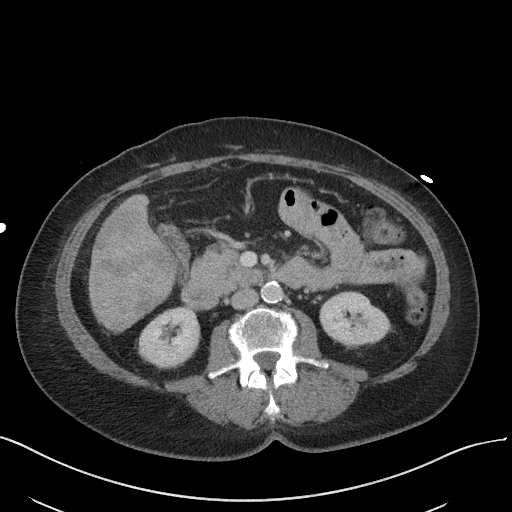
[im 76/106  soft-tissue]
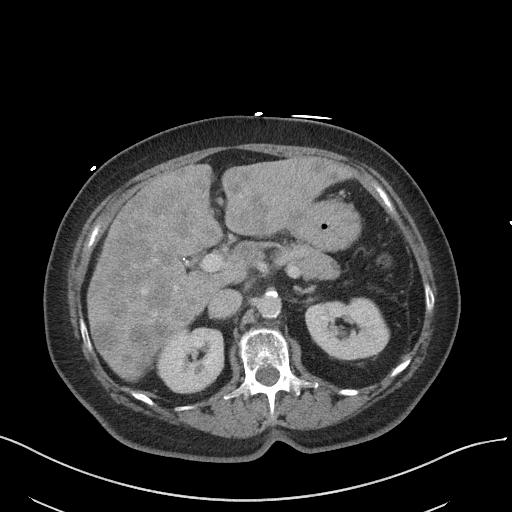
[im 76/106  bone]
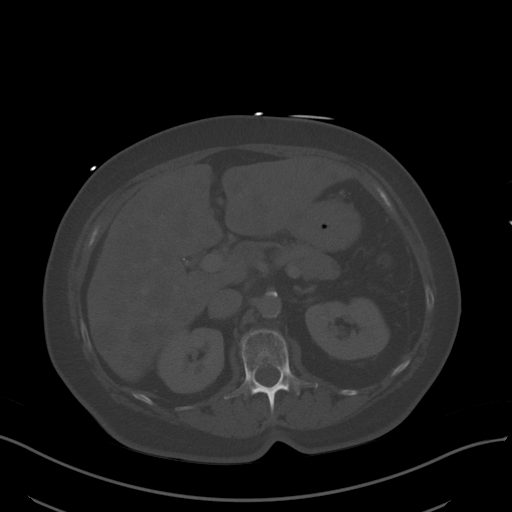
[im 82/106  soft-tissue]
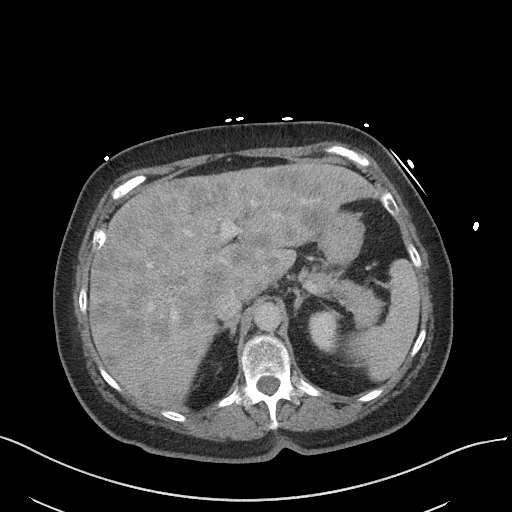
[im 88/106  soft-tissue]
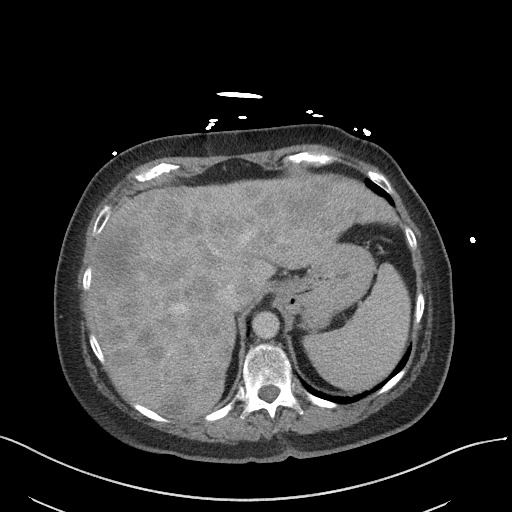
[im 100/106  soft-tissue]
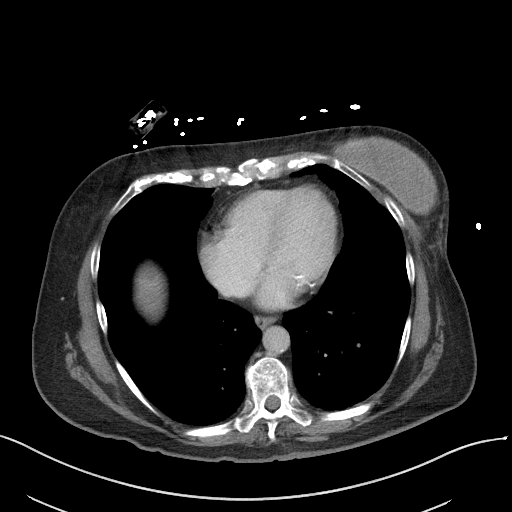

[Series 6: a/p w/ cor · coronal · 0.80mm/px · 3 of 129 slices shown]
[im 43/129  soft-tissue]
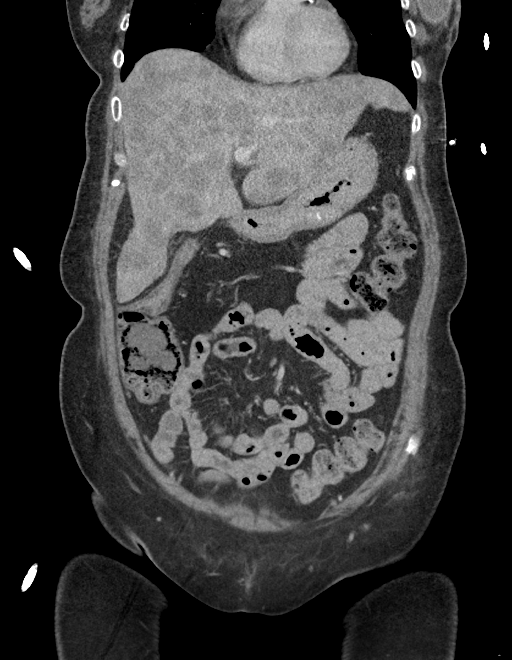
[im 57/129  soft-tissue]
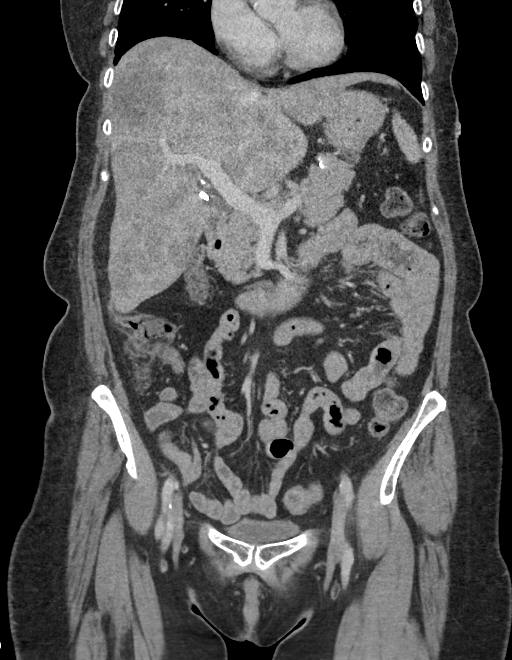
[im 72/129  soft-tissue]
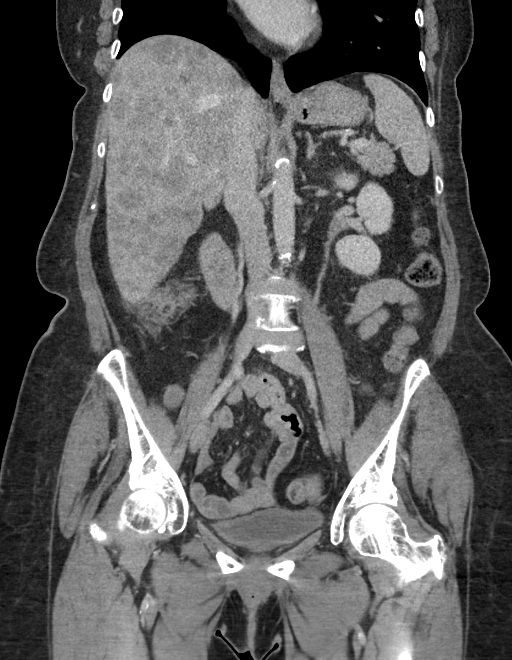

[15 of 46 positions shown; findings below may reference images not displayed]

RADIATION DOSE REDUCTION: This exam was performed according to the
departmental dose-optimization program which includes automated
exposure control, adjustment of the mA and/or kV according to
patient size and/or use of iterative reconstruction technique.

CONTRAST:  75mL OMNIPAQUE IOHEXOL 300 MG/ML  SOLN
FINDINGS: Lower chest: Visualized lung bases are clear.

Hepatobiliary: Extensive ill-defined low-attenuation lesions are
identified throughout both lobes of compatible with metastatic
disease. This is a new finding when compared with 03/02/2020. Index
lesion within right lobe of liver measures 2.8 by 2.1 cm, image
[DATE]. Index lesion within segment 4 measures 2.4 x 2.0 cm, image
[DATE]. Index lesion within segment [DATE] measures 2.4 x 2.3 cm, image
[DATE]. Status post cholecystectomy. No bile duct dilatation.

Pancreas: Unremarkable. No pancreatic ductal dilatation or
surrounding inflammatory changes.

Spleen: Normal in size without focal abnormality.

Adrenals/Urinary Tract: Normal adrenal glands. No kidney mass or
hydronephrosis identified. No nephrolithiasis. Urinary bladder is
unremarkable.

Stomach/Bowel: Stomach appears within normal limits. No small bowel
wall thickening, inflammation, or distension. No pathologic
dilatation of the colon. No obstructing colonic mass identified.

Vascular/Lymphatic: Aortic atherosclerosis. No aneurysm. No
abdominopelvic adenopathy.

Reproductive: Small exophytic calcified fibroid arises off the
anterior uterus. Uterus is otherwise unremarkable. No adnexal mass.

Other: No free fluid or fluid collections. No aggressive lytic or
sclerotic bone lesions.

Musculoskeletal: No acute or significant osseous findings.
IMPRESSION: 1. No acute findings identified within the abdomen or pelvis.
2. Extensive ill-defined low-attenuation lesions throughout both
lobes of liver compatible with metastatic disease. This is a new
finding when compared with previous CT of the abdomen pelvis from
03/02/2020. No primary neoplasm identified within the abdomen or
pelvis.
3. Aortic Atherosclerosis ([id number]).

## 2022-08-12 IMAGING — CT CT CHEST W/ CM
2 of 4 series · 15 of 36 positions shown, 18 images · IV contrast (APPLIED)
Comparison: CT AP 03/31/2021.

CLINICAL DATA: Evaluate for malignancy. Newly discovered liver
metastases.

EXAM:
CT CHEST WITH CONTRAST
TECHNIQUE: Multidetector CT imaging of the chest was performed during
intravenous contrast administration.

[Series 3: chest w · axial · 0.67mm/px · z∈[-763,-477]mm · 12 of 167 slices shown, 15 images]
[im 12/167  mediastinal]
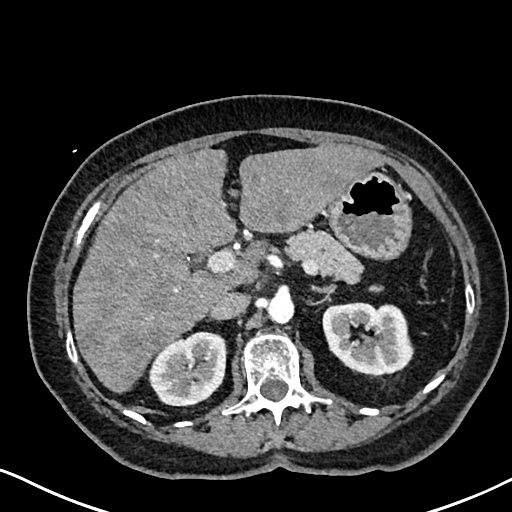
[im 12/167  lung]
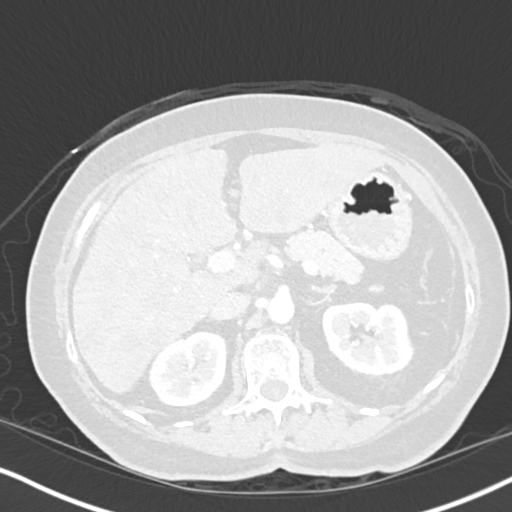
[im 23/167  lung]
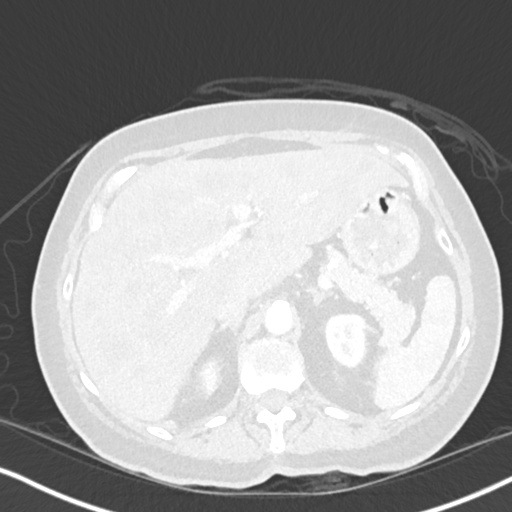
[im 34/167  lung]
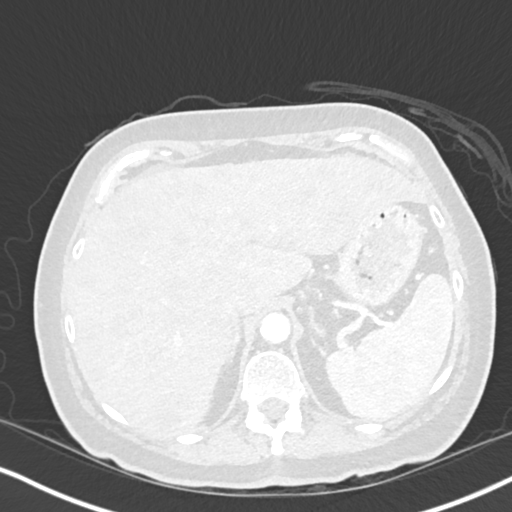
[im 56/167  lung]
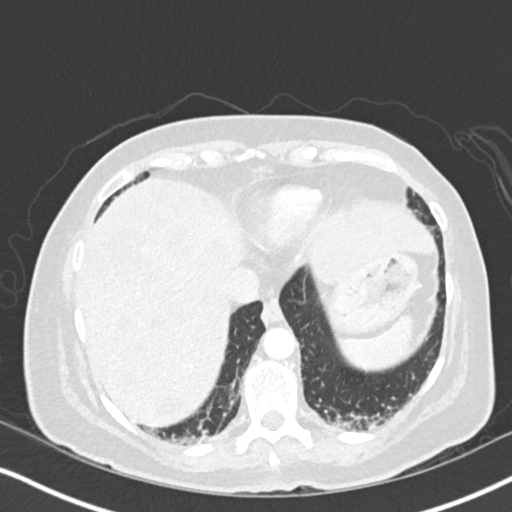
[im 67/167  mediastinal]
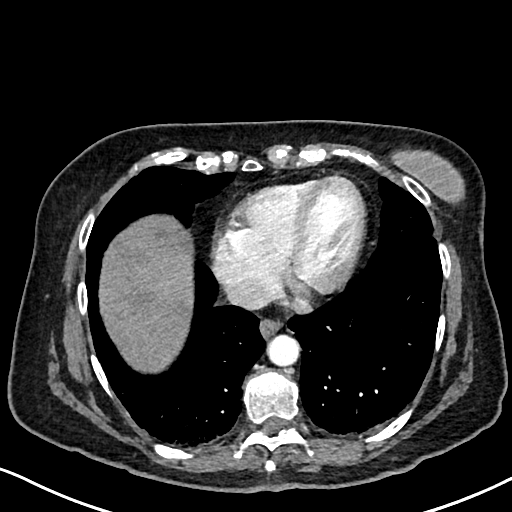
[im 67/167  lung]
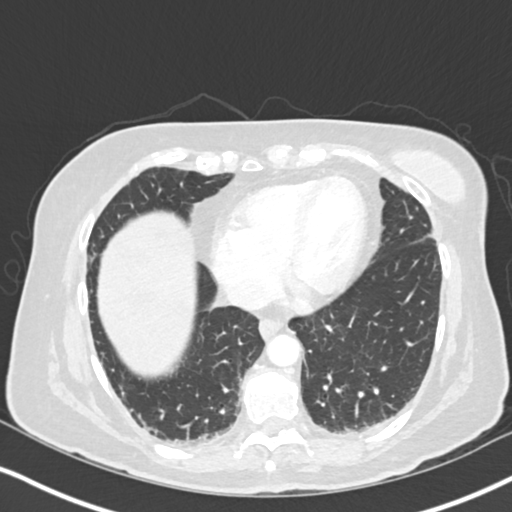
[im 78/167  lung]
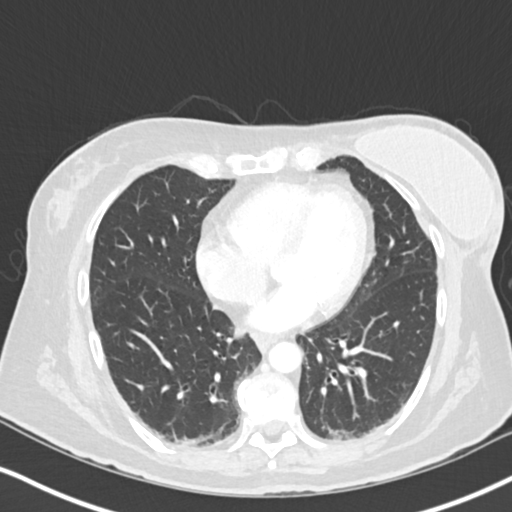
[im 89/167  lung]
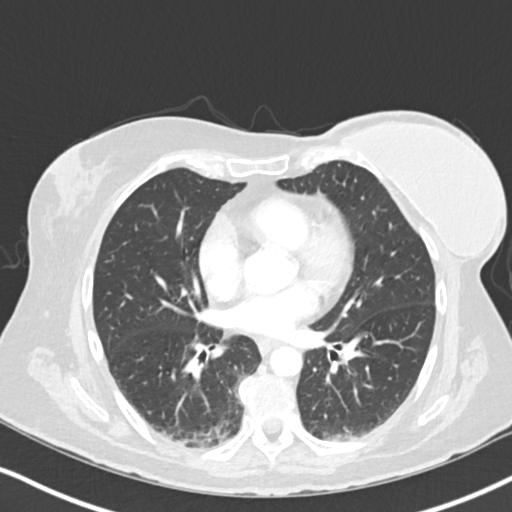
[im 100/167  lung]
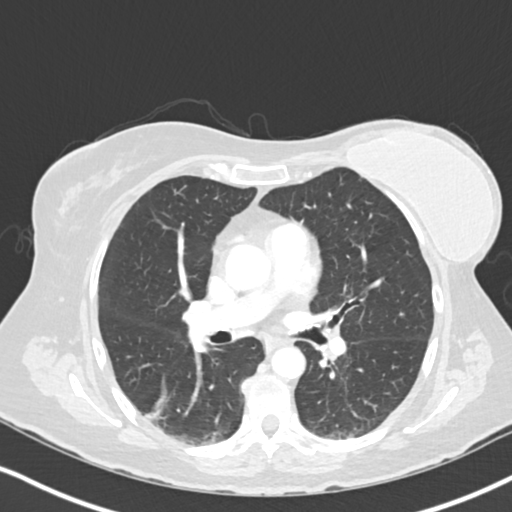
[im 111/167  mediastinal]
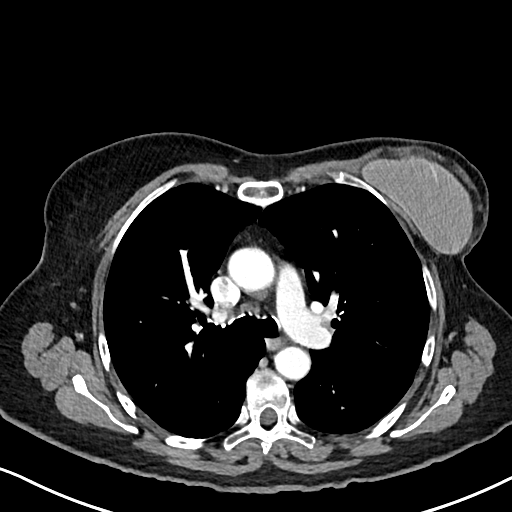
[im 111/167  lung]
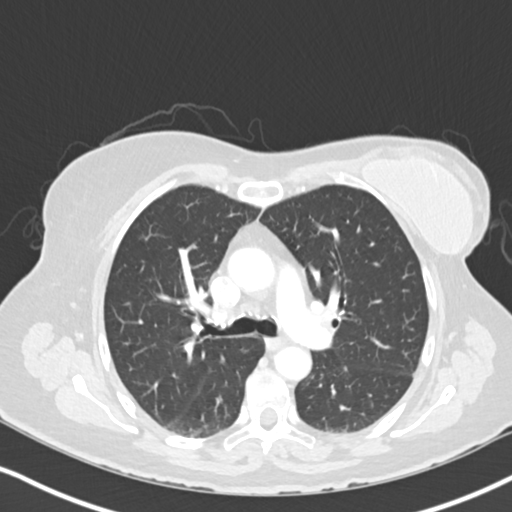
[im 133/167  lung]
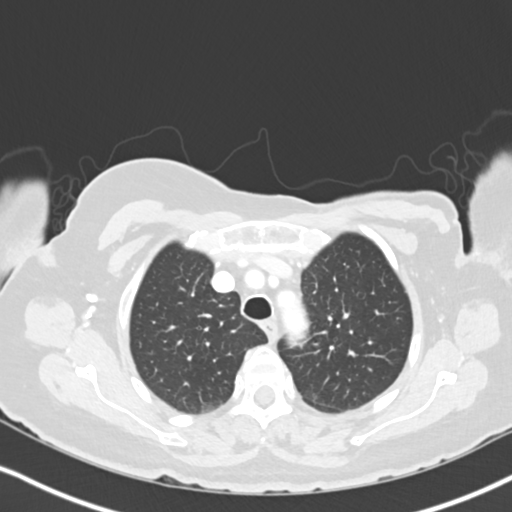
[im 144/167  lung]
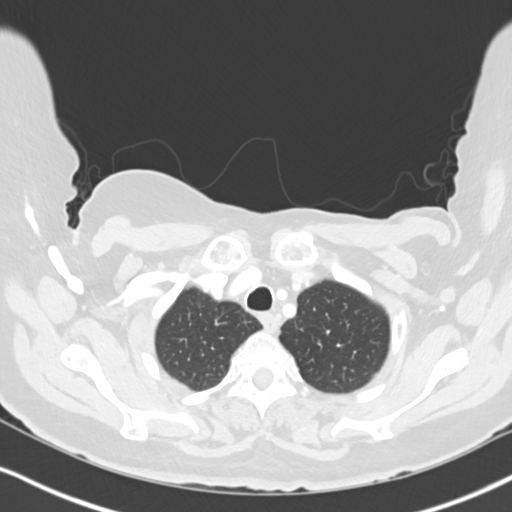
[im 155/167  lung]
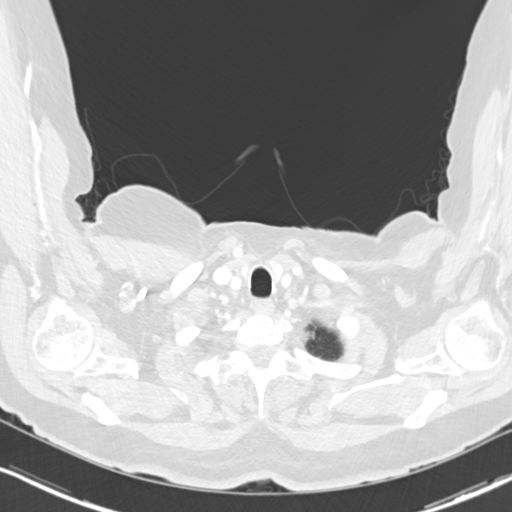

[Series 6: cor · coronal · 0.67mm/px · 3 of 131 slices shown]
[im 27/131  lung]
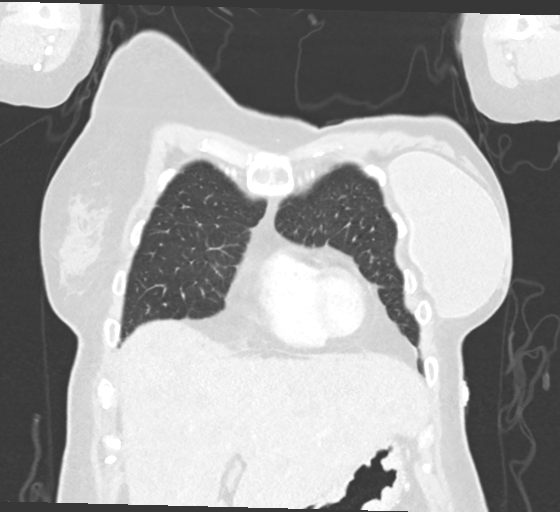
[im 53/131  lung]
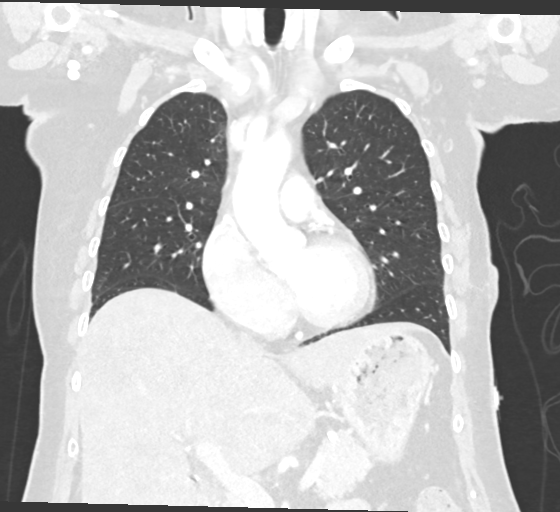
[im 79/131  lung]
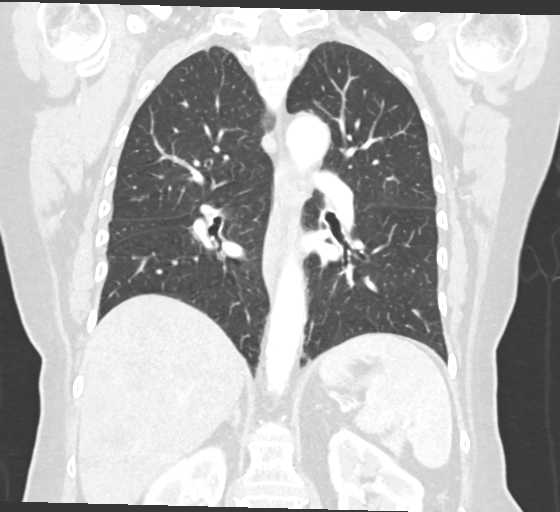

[15 of 36 positions shown; findings below may reference images not displayed]

RADIATION DOSE REDUCTION: This exam was performed according to the
departmental dose-optimization program which includes automated
exposure control, adjustment of the mA and/or kV according to
patient size and/or use of iterative reconstruction technique.

CONTRAST:  100mL OMNIPAQUE IOHEXOL 350 MG/ML SOLN
FINDINGS: Cardiovascular: No significant vascular findings. Normal heart size.
No pericardial effusion. Aortic atherosclerosis and coronary artery
calcifications.

Mediastinum/Nodes: No enlarged mediastinal, hilar, or axillary lymph
nodes. Thyroid gland, trachea, and esophagus demonstrate no
significant findings.

Lungs/Pleura: Scar like density identified within the superior
segment of right lower lobe. No pleural effusion, airspace
consolidation, or pneumothorax. No suspicious pulmonary nodule or
mass identified. Dependent changes are noted within the posterior
lung bases.

Upper Abdomen: Multifocal liver metastases are again identified
involving both lobes of liver. No acute abnormality noted within the
imaged portions of the upper abdomen.

Musculoskeletal: Status post left mastectomy with implant
reconstruction. There is mild skin thickening with subcutaneous soft
tissue stranding within the periphery of the reconstructed left
breast, image 90/3.
IMPRESSION: 1. No evidence for primary lung neoplasm or metastatic disease to
the chest.
2. Status post left mastectomy with implant reconstruction. There is
mild skin thickening with subcutaneous soft tissue stranding within
the periphery of the reconstructed left breast. This is nonspecific
and may be related to postsurgical change.
3. Multifocal liver metastases.
4. Coronary artery calcifications.
5. Aortic Atherosclerosis ([id number]).

## 2022-09-05 IMAGING — US US PARACENTESIS
1 series · 5 of 5 positions shown · non-contrast
Comparison: none

INDICATION: Patient with a history of metastatic breast cancer and new onset
ascites presents today for a diagnostic and therapeutic
paracentesis.

[Series 1: us paracentesis · 0.25mm/px · 5 of 5 slices shown]
[im 1/5]
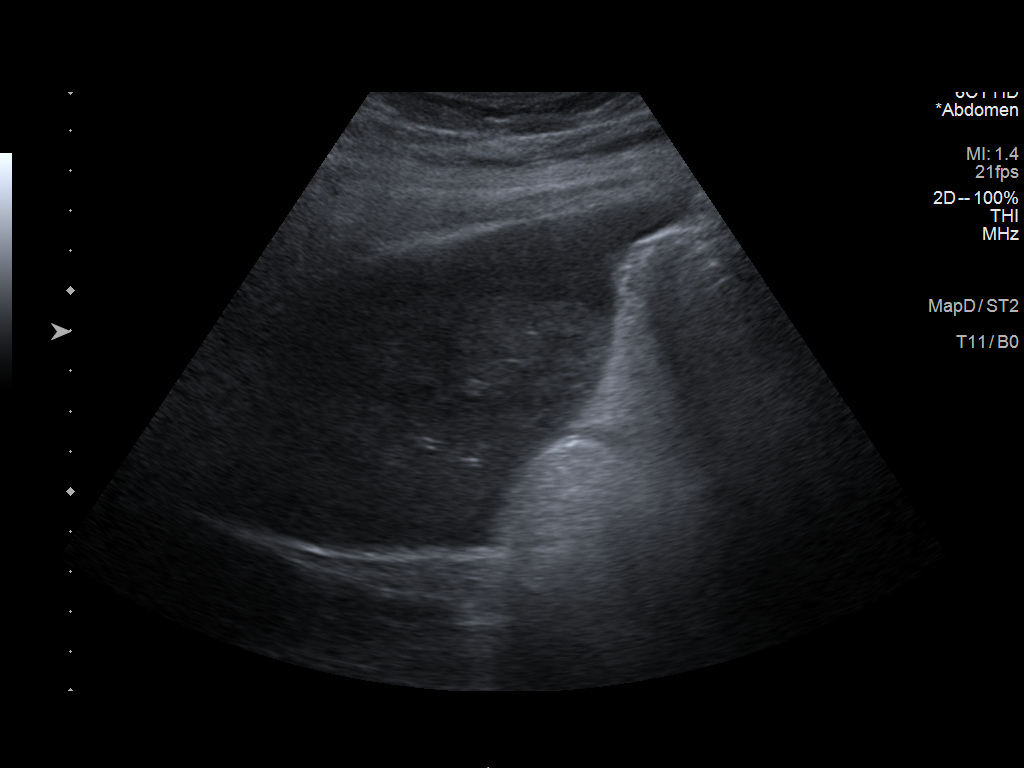
[im 2/5]
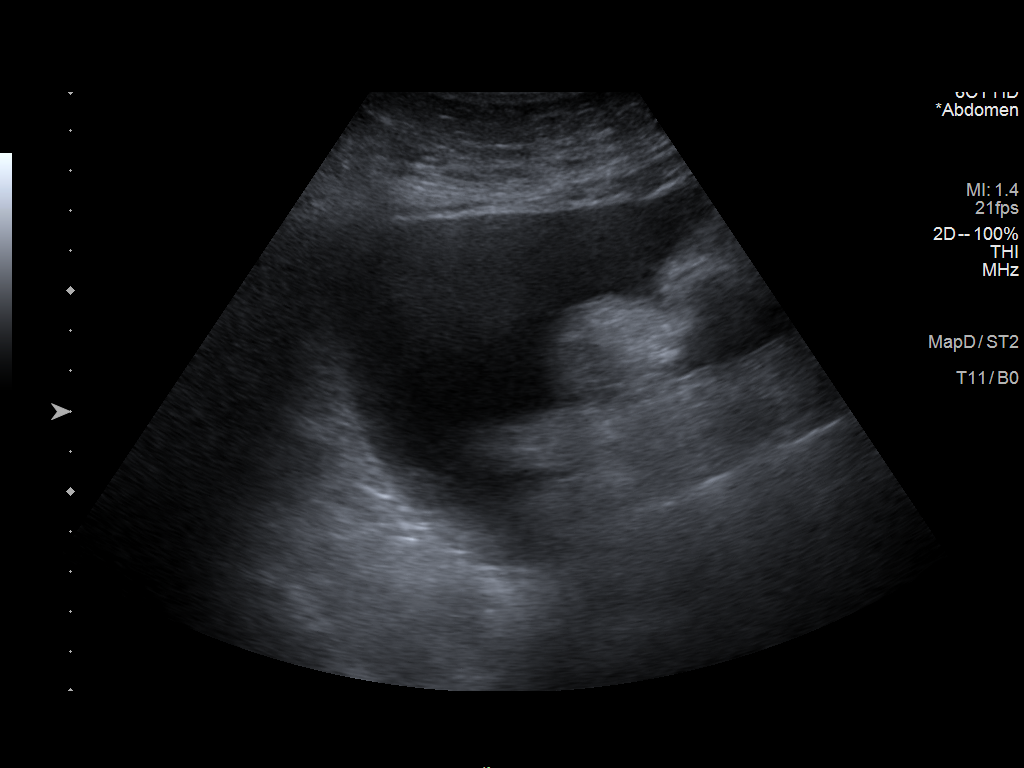
[im 3/5]
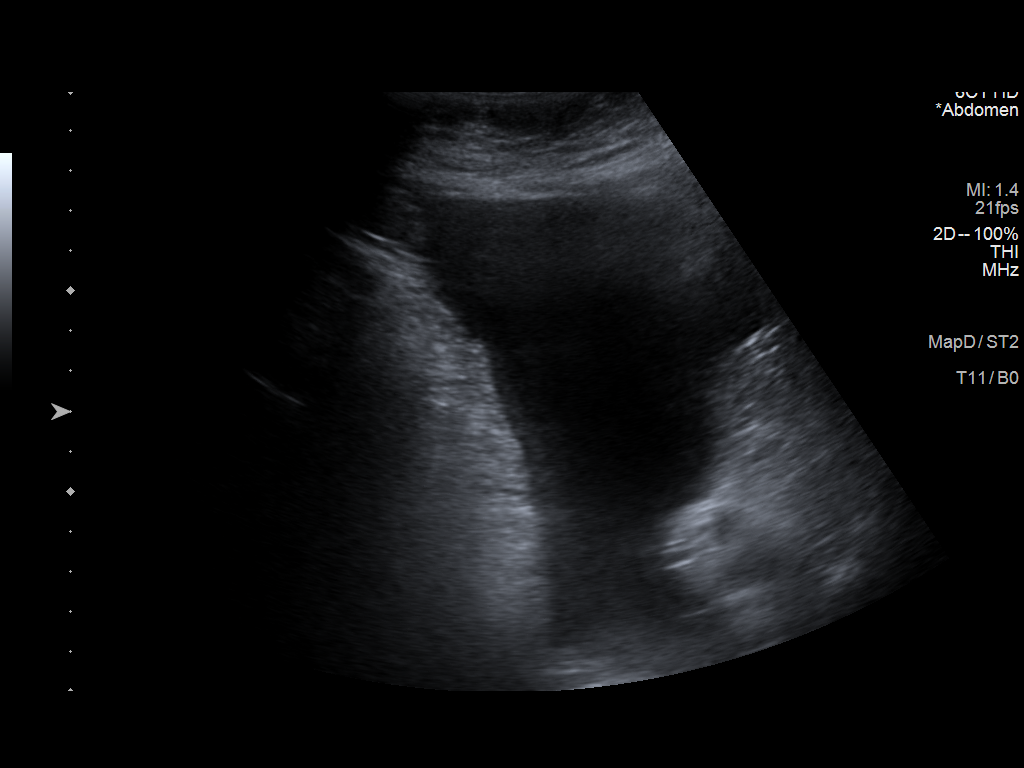
[im 4/5]
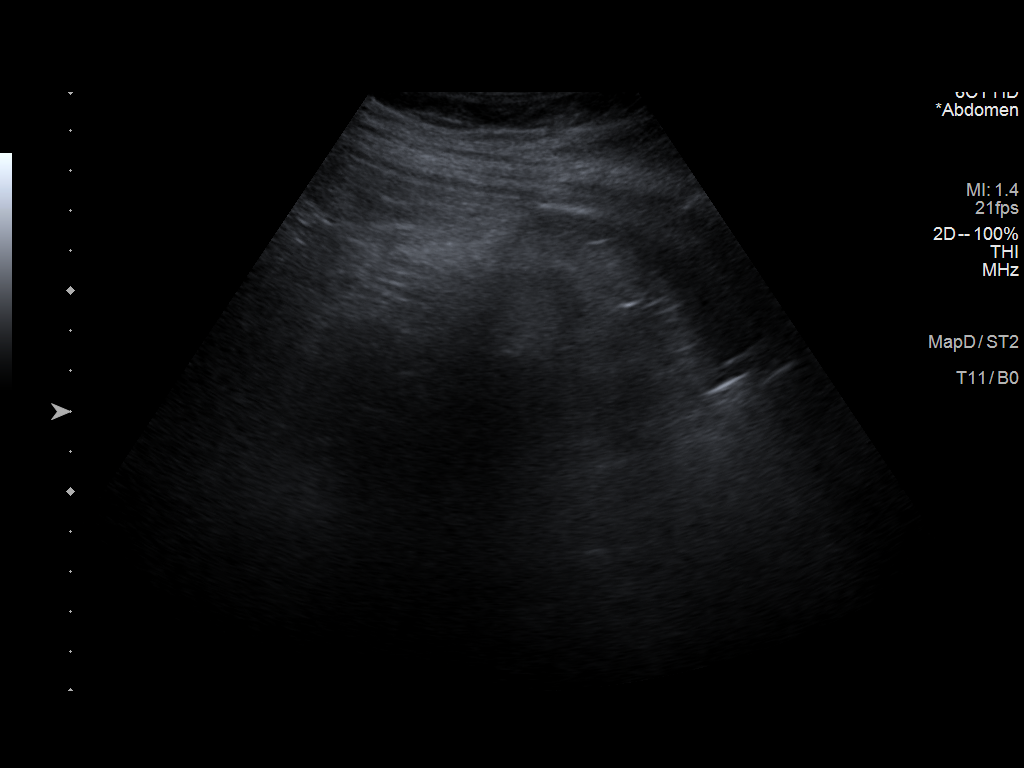
[im 5/5]
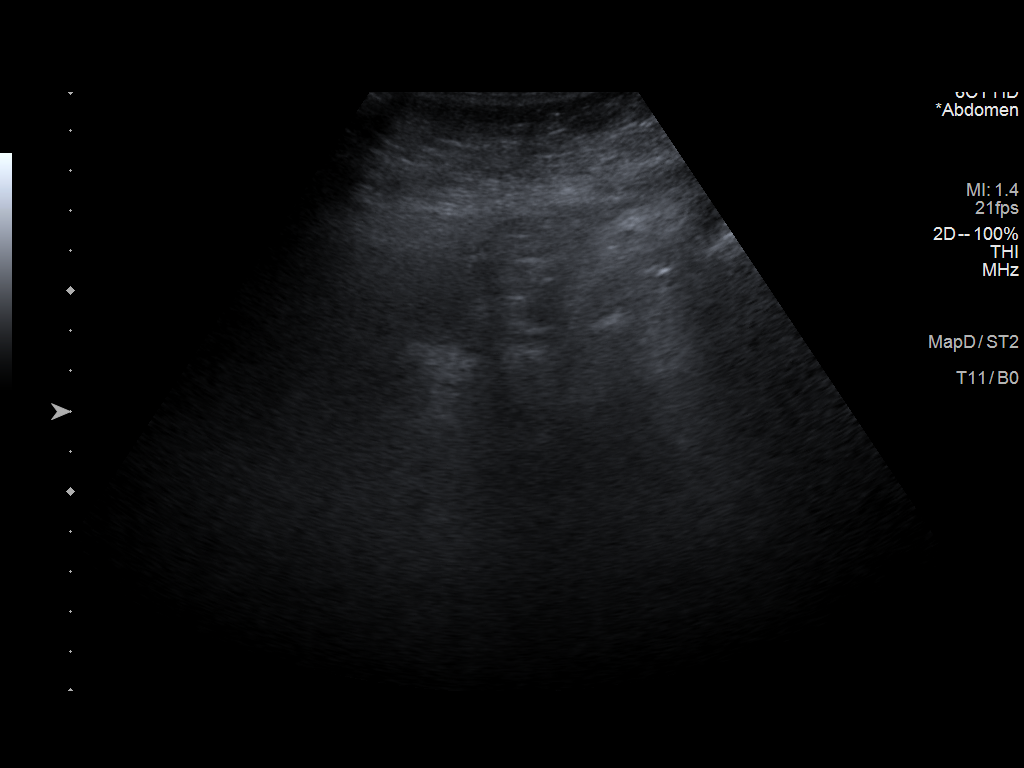

[5 of 5 positions shown; findings below may reference images not displayed]

EXAM:
ULTRASOUND GUIDED PARACENTESIS

MEDICATIONS:
1% lidocaine 10 mL

COMPLICATIONS:
None immediate.

PROCEDURE:
Informed written consent was obtained from the patient after a
discussion of the risks, benefits and alternatives to treatment. A
timeout was performed prior to the initiation of the procedure.

Initial ultrasound scanning demonstrates a large amount of ascites
within the right lower abdominal quadrant. The right lower abdomen
was prepped and draped in the usual sterile fashion. 1% lidocaine
was used for local anesthesia.

Following this, a 19 gauge, 7-cm, Yueh catheter was introduced. An
ultrasound image was saved for documentation purposes. The
paracentesis was performed. The catheter was removed and a dressing
was applied. The patient tolerated the procedure well without
immediate post procedural complication.
FINDINGS: A total of approximately 1.6 L of clear yellow fluid was removed.
Samples were sent to the laboratory as requested by the clinical
team.
IMPRESSION: Successful ultrasound-guided paracentesis yielding 1.6 liters of
peritoneal fluid.

Read by: Blain Jumper, NP
